# Patient Record
Sex: Female | Born: 1935 | Race: White | Hispanic: No | Smoking: Never smoker
Health system: Southern US, Community
[De-identification: ages and names within clinical notes are randomized; demographics above are authoritative.]

## PROBLEM LIST (undated history)

## (undated) ENCOUNTER — Inpatient Hospital Stay: Admission: EM | Payer: Self-pay | Source: Home / Self Care

## (undated) DIAGNOSIS — I10 Essential (primary) hypertension: Secondary | ICD-10-CM

## (undated) DIAGNOSIS — E119 Type 2 diabetes mellitus without complications: Secondary | ICD-10-CM

## (undated) DIAGNOSIS — D649 Anemia, unspecified: Secondary | ICD-10-CM

## (undated) DIAGNOSIS — E785 Hyperlipidemia, unspecified: Secondary | ICD-10-CM

## (undated) DIAGNOSIS — G2581 Restless legs syndrome: Secondary | ICD-10-CM

## (undated) DIAGNOSIS — Z720 Tobacco use: Secondary | ICD-10-CM

## (undated) DIAGNOSIS — I2510 Atherosclerotic heart disease of native coronary artery without angina pectoris: Secondary | ICD-10-CM

## (undated) DIAGNOSIS — I509 Heart failure, unspecified: Secondary | ICD-10-CM

## (undated) DIAGNOSIS — F419 Anxiety disorder, unspecified: Secondary | ICD-10-CM

## (undated) DIAGNOSIS — K219 Gastro-esophageal reflux disease without esophagitis: Secondary | ICD-10-CM

## (undated) DIAGNOSIS — I480 Paroxysmal atrial fibrillation: Secondary | ICD-10-CM

## (undated) HISTORY — DX: Paroxysmal atrial fibrillation: I48.0

## (undated) HISTORY — DX: Tobacco use: Z72.0

## (undated) HISTORY — DX: Anemia, unspecified: D64.9

## (undated) HISTORY — DX: Essential (primary) hypertension: I10

## (undated) HISTORY — DX: Restless legs syndrome: G25.81

## (undated) HISTORY — DX: Anxiety disorder, unspecified: F41.9

## (undated) HISTORY — DX: Heart failure, unspecified: I50.9

## (undated) HISTORY — DX: Hyperlipidemia, unspecified: E78.5

## (undated) HISTORY — DX: Gastro-esophageal reflux disease without esophagitis: K21.9

## (undated) HISTORY — DX: Atherosclerotic heart disease of native coronary artery without angina pectoris: I25.10

## (undated) HISTORY — DX: Type 2 diabetes mellitus without complications: E11.9

---

## 1997-06-02 ENCOUNTER — Encounter: Admission: RE | Admit: 1997-06-02 | Discharge: 1997-06-02 | Payer: Self-pay | Admitting: Sports Medicine

## 1997-08-13 ENCOUNTER — Encounter: Admission: RE | Admit: 1997-08-13 | Discharge: 1997-08-13 | Payer: Self-pay | Admitting: Sports Medicine

## 1997-08-26 ENCOUNTER — Encounter: Admission: RE | Admit: 1997-08-26 | Discharge: 1997-08-26 | Payer: Self-pay | Admitting: Family Medicine

## 1997-09-04 ENCOUNTER — Encounter: Admission: RE | Admit: 1997-09-04 | Discharge: 1997-09-04 | Payer: Self-pay | Admitting: Family Medicine

## 1997-09-11 ENCOUNTER — Encounter: Admission: RE | Admit: 1997-09-11 | Discharge: 1997-09-11 | Payer: Self-pay | Admitting: Family Medicine

## 1997-10-19 ENCOUNTER — Encounter: Admission: RE | Admit: 1997-10-19 | Discharge: 1997-10-19 | Payer: Self-pay | Admitting: Family Medicine

## 1997-11-17 ENCOUNTER — Encounter: Admission: RE | Admit: 1997-11-17 | Discharge: 1997-11-17 | Payer: Self-pay | Admitting: Family Medicine

## 1998-01-13 ENCOUNTER — Encounter: Admission: RE | Admit: 1998-01-13 | Discharge: 1998-01-13 | Payer: Self-pay | Admitting: Family Medicine

## 1998-02-15 ENCOUNTER — Encounter: Admission: RE | Admit: 1998-02-15 | Discharge: 1998-02-15 | Payer: Self-pay | Admitting: Sports Medicine

## 1998-03-03 ENCOUNTER — Encounter: Admission: RE | Admit: 1998-03-03 | Discharge: 1998-03-03 | Payer: Self-pay | Admitting: Sports Medicine

## 1998-04-06 ENCOUNTER — Encounter: Admission: RE | Admit: 1998-04-06 | Discharge: 1998-04-06 | Payer: Self-pay | Admitting: Sports Medicine

## 1998-05-10 ENCOUNTER — Encounter: Admission: RE | Admit: 1998-05-10 | Discharge: 1998-05-10 | Payer: Self-pay | Admitting: Family Medicine

## 1998-06-21 ENCOUNTER — Encounter: Admission: RE | Admit: 1998-06-21 | Discharge: 1998-06-21 | Payer: Self-pay | Admitting: Family Medicine

## 1998-06-29 ENCOUNTER — Encounter: Admission: RE | Admit: 1998-06-29 | Discharge: 1998-06-29 | Payer: Self-pay | Admitting: Sports Medicine

## 1998-06-30 ENCOUNTER — Encounter: Admission: RE | Admit: 1998-06-30 | Discharge: 1998-06-30 | Payer: Self-pay | Admitting: Family Medicine

## 1998-08-03 ENCOUNTER — Encounter: Admission: RE | Admit: 1998-08-03 | Discharge: 1998-08-03 | Payer: Self-pay | Admitting: Sports Medicine

## 1998-09-09 ENCOUNTER — Ambulatory Visit (HOSPITAL_COMMUNITY): Admission: RE | Admit: 1998-09-09 | Discharge: 1998-09-09 | Payer: Self-pay | Admitting: *Deleted

## 1998-09-14 ENCOUNTER — Encounter: Admission: RE | Admit: 1998-09-14 | Discharge: 1998-09-14 | Payer: Self-pay | Admitting: Family Medicine

## 1998-09-28 ENCOUNTER — Encounter: Admission: RE | Admit: 1998-09-28 | Discharge: 1998-09-28 | Payer: Self-pay | Admitting: Family Medicine

## 1998-10-18 ENCOUNTER — Encounter: Admission: RE | Admit: 1998-10-18 | Discharge: 1998-10-18 | Payer: Self-pay | Admitting: Family Medicine

## 1998-12-10 ENCOUNTER — Encounter: Admission: RE | Admit: 1998-12-10 | Discharge: 1998-12-10 | Payer: Self-pay | Admitting: Family Medicine

## 1999-05-03 ENCOUNTER — Encounter: Admission: RE | Admit: 1999-05-03 | Discharge: 1999-05-03 | Payer: Self-pay | Admitting: Sports Medicine

## 1999-06-29 ENCOUNTER — Encounter: Admission: RE | Admit: 1999-06-29 | Discharge: 1999-06-29 | Payer: Self-pay | Admitting: Sports Medicine

## 1999-06-29 ENCOUNTER — Encounter: Payer: Self-pay | Admitting: Sports Medicine

## 1999-07-27 ENCOUNTER — Encounter: Admission: RE | Admit: 1999-07-27 | Discharge: 1999-07-27 | Payer: Self-pay | Admitting: Family Medicine

## 1999-08-09 ENCOUNTER — Ambulatory Visit: Admission: RE | Admit: 1999-08-09 | Discharge: 1999-08-09 | Payer: Self-pay

## 1999-12-14 ENCOUNTER — Encounter: Admission: RE | Admit: 1999-12-14 | Discharge: 1999-12-14 | Payer: Self-pay | Admitting: Family Medicine

## 2000-01-04 ENCOUNTER — Encounter: Admission: RE | Admit: 2000-01-04 | Discharge: 2000-01-04 | Payer: Self-pay | Admitting: Family Medicine

## 2000-03-15 ENCOUNTER — Encounter: Admission: RE | Admit: 2000-03-15 | Discharge: 2000-03-15 | Payer: Self-pay | Admitting: Family Medicine

## 2000-04-26 ENCOUNTER — Encounter: Admission: RE | Admit: 2000-04-26 | Discharge: 2000-04-26 | Payer: Self-pay | Admitting: Family Medicine

## 2000-05-02 ENCOUNTER — Encounter: Payer: Self-pay | Admitting: Sports Medicine

## 2000-05-02 ENCOUNTER — Encounter: Admission: RE | Admit: 2000-05-02 | Discharge: 2000-05-02 | Payer: Self-pay | Admitting: Sports Medicine

## 2000-05-07 ENCOUNTER — Encounter: Admission: RE | Admit: 2000-05-07 | Discharge: 2000-05-07 | Payer: Self-pay | Admitting: Family Medicine

## 2000-05-15 ENCOUNTER — Encounter: Admission: RE | Admit: 2000-05-15 | Discharge: 2000-05-15 | Payer: Self-pay | Admitting: Family Medicine

## 2000-06-01 ENCOUNTER — Encounter: Admission: RE | Admit: 2000-06-01 | Discharge: 2000-06-01 | Payer: Self-pay | Admitting: Family Medicine

## 2000-07-03 ENCOUNTER — Encounter: Admission: RE | Admit: 2000-07-03 | Discharge: 2000-07-03 | Payer: Self-pay | Admitting: Family Medicine

## 2000-07-19 ENCOUNTER — Encounter: Admission: RE | Admit: 2000-07-19 | Discharge: 2000-07-19 | Payer: Self-pay | Admitting: Family Medicine

## 2000-07-25 ENCOUNTER — Ambulatory Visit (HOSPITAL_COMMUNITY): Admission: RE | Admit: 2000-07-25 | Discharge: 2000-07-25 | Payer: Self-pay | Admitting: *Deleted

## 2000-08-13 ENCOUNTER — Encounter: Admission: RE | Admit: 2000-08-13 | Discharge: 2000-08-13 | Payer: Self-pay | Admitting: Family Medicine

## 2000-08-21 ENCOUNTER — Encounter: Admission: RE | Admit: 2000-08-21 | Discharge: 2000-08-21 | Payer: Self-pay | Admitting: Family Medicine

## 2000-09-27 ENCOUNTER — Encounter (INDEPENDENT_AMBULATORY_CARE_PROVIDER_SITE_OTHER): Payer: Self-pay | Admitting: *Deleted

## 2000-09-27 ENCOUNTER — Encounter: Admission: RE | Admit: 2000-09-27 | Discharge: 2000-09-27 | Payer: Self-pay | Admitting: Family Medicine

## 2000-09-27 LAB — CONVERTED CEMR LAB

## 2000-10-01 ENCOUNTER — Other Ambulatory Visit: Admission: RE | Admit: 2000-10-01 | Discharge: 2000-10-01 | Payer: Self-pay | Admitting: Obstetrics and Gynecology

## 2000-10-09 ENCOUNTER — Encounter: Admission: RE | Admit: 2000-10-09 | Discharge: 2000-10-09 | Payer: Self-pay | Admitting: Sports Medicine

## 2000-10-09 ENCOUNTER — Encounter: Payer: Self-pay | Admitting: Sports Medicine

## 2000-10-18 ENCOUNTER — Encounter: Admission: RE | Admit: 2000-10-18 | Discharge: 2000-10-18 | Payer: Self-pay | Admitting: Family Medicine

## 2000-11-05 ENCOUNTER — Ambulatory Visit (HOSPITAL_COMMUNITY): Admission: RE | Admit: 2000-11-05 | Discharge: 2000-11-05 | Payer: Self-pay | Admitting: Obstetrics and Gynecology

## 2000-11-14 ENCOUNTER — Ambulatory Visit (HOSPITAL_COMMUNITY): Admission: RE | Admit: 2000-11-14 | Discharge: 2000-11-14 | Payer: Self-pay | Admitting: Cardiovascular Disease

## 2000-11-14 ENCOUNTER — Encounter: Payer: Self-pay | Admitting: Cardiovascular Disease

## 2000-11-23 ENCOUNTER — Encounter: Admission: RE | Admit: 2000-11-23 | Discharge: 2000-11-23 | Payer: Self-pay | Admitting: Family Medicine

## 2001-01-10 ENCOUNTER — Encounter: Admission: RE | Admit: 2001-01-10 | Discharge: 2001-01-10 | Payer: Self-pay | Admitting: Family Medicine

## 2001-01-29 ENCOUNTER — Encounter: Admission: RE | Admit: 2001-01-29 | Discharge: 2001-01-29 | Payer: Self-pay | Admitting: Sports Medicine

## 2001-01-29 ENCOUNTER — Encounter: Payer: Self-pay | Admitting: Sports Medicine

## 2001-03-22 ENCOUNTER — Ambulatory Visit (HOSPITAL_COMMUNITY): Admission: RE | Admit: 2001-03-22 | Discharge: 2001-03-22 | Payer: Self-pay | Admitting: Obstetrics and Gynecology

## 2001-03-22 ENCOUNTER — Encounter (INDEPENDENT_AMBULATORY_CARE_PROVIDER_SITE_OTHER): Payer: Self-pay

## 2001-05-17 ENCOUNTER — Encounter: Admission: RE | Admit: 2001-05-17 | Discharge: 2001-05-17 | Payer: Self-pay | Admitting: Family Medicine

## 2001-06-10 ENCOUNTER — Encounter: Admission: RE | Admit: 2001-06-10 | Discharge: 2001-06-10 | Payer: Self-pay | Admitting: Family Medicine

## 2001-07-10 ENCOUNTER — Encounter: Admission: RE | Admit: 2001-07-10 | Discharge: 2001-07-10 | Payer: Self-pay | Admitting: Family Medicine

## 2001-07-23 ENCOUNTER — Encounter: Admission: RE | Admit: 2001-07-23 | Discharge: 2001-07-23 | Payer: Self-pay | Admitting: Sports Medicine

## 2001-07-23 ENCOUNTER — Encounter: Payer: Self-pay | Admitting: Sports Medicine

## 2001-08-06 ENCOUNTER — Encounter: Payer: Self-pay | Admitting: Sports Medicine

## 2001-08-06 ENCOUNTER — Encounter: Admission: RE | Admit: 2001-08-06 | Discharge: 2001-09-03 | Payer: Self-pay | Admitting: Sports Medicine

## 2001-08-06 ENCOUNTER — Encounter: Admission: RE | Admit: 2001-08-06 | Discharge: 2001-08-06 | Payer: Self-pay | Admitting: Sports Medicine

## 2001-08-08 ENCOUNTER — Encounter: Admission: RE | Admit: 2001-08-08 | Discharge: 2001-08-08 | Payer: Self-pay | Admitting: Family Medicine

## 2001-08-29 ENCOUNTER — Encounter: Admission: RE | Admit: 2001-08-29 | Discharge: 2001-08-29 | Payer: Self-pay | Admitting: Family Medicine

## 2001-12-10 ENCOUNTER — Encounter: Admission: RE | Admit: 2001-12-10 | Discharge: 2001-12-10 | Payer: Self-pay | Admitting: Sports Medicine

## 2001-12-30 ENCOUNTER — Encounter: Payer: Self-pay | Admitting: Emergency Medicine

## 2001-12-30 ENCOUNTER — Inpatient Hospital Stay (HOSPITAL_COMMUNITY): Admission: EM | Admit: 2001-12-30 | Discharge: 2002-01-01 | Payer: Self-pay | Admitting: Emergency Medicine

## 2001-12-30 ENCOUNTER — Encounter: Payer: Self-pay | Admitting: Family Medicine

## 2002-01-03 ENCOUNTER — Encounter: Admission: RE | Admit: 2002-01-03 | Discharge: 2002-01-03 | Payer: Self-pay | Admitting: Family Medicine

## 2002-01-28 ENCOUNTER — Encounter: Admission: RE | Admit: 2002-01-28 | Discharge: 2002-01-28 | Payer: Self-pay | Admitting: Family Medicine

## 2002-02-28 ENCOUNTER — Emergency Department (HOSPITAL_COMMUNITY): Admission: EM | Admit: 2002-02-28 | Discharge: 2002-02-28 | Payer: Self-pay | Admitting: Emergency Medicine

## 2002-03-03 ENCOUNTER — Encounter: Admission: RE | Admit: 2002-03-03 | Discharge: 2002-03-03 | Payer: Self-pay | Admitting: Family Medicine

## 2002-03-10 ENCOUNTER — Encounter: Admission: RE | Admit: 2002-03-10 | Discharge: 2002-03-10 | Payer: Self-pay | Admitting: Family Medicine

## 2002-04-23 ENCOUNTER — Encounter: Admission: RE | Admit: 2002-04-23 | Discharge: 2002-04-23 | Payer: Self-pay | Admitting: Family Medicine

## 2002-05-19 ENCOUNTER — Encounter: Admission: RE | Admit: 2002-05-19 | Discharge: 2002-05-19 | Payer: Self-pay | Admitting: Family Medicine

## 2002-07-03 ENCOUNTER — Encounter: Admission: RE | Admit: 2002-07-03 | Discharge: 2002-07-03 | Payer: Self-pay | Admitting: Family Medicine

## 2002-08-14 ENCOUNTER — Inpatient Hospital Stay (HOSPITAL_COMMUNITY): Admission: EM | Admit: 2002-08-14 | Discharge: 2002-08-28 | Payer: Self-pay | Admitting: *Deleted

## 2002-08-15 ENCOUNTER — Encounter (INDEPENDENT_AMBULATORY_CARE_PROVIDER_SITE_OTHER): Payer: Self-pay | Admitting: Cardiology

## 2002-08-15 ENCOUNTER — Encounter: Payer: Self-pay | Admitting: *Deleted

## 2002-08-19 ENCOUNTER — Encounter: Payer: Self-pay | Admitting: *Deleted

## 2002-08-22 ENCOUNTER — Encounter (INDEPENDENT_AMBULATORY_CARE_PROVIDER_SITE_OTHER): Payer: Self-pay | Admitting: Specialist

## 2002-08-22 ENCOUNTER — Encounter: Payer: Self-pay | Admitting: Cardiothoracic Surgery

## 2002-08-23 ENCOUNTER — Encounter: Payer: Self-pay | Admitting: Cardiothoracic Surgery

## 2002-08-24 ENCOUNTER — Encounter: Payer: Self-pay | Admitting: Surgery

## 2002-08-25 HISTORY — PX: AORTIC VALVE REPLACEMENT: SHX41

## 2002-10-16 ENCOUNTER — Encounter: Admission: RE | Admit: 2002-10-16 | Discharge: 2002-10-16 | Payer: Self-pay | Admitting: Family Medicine

## 2002-12-24 ENCOUNTER — Encounter: Admission: RE | Admit: 2002-12-24 | Discharge: 2002-12-24 | Payer: Self-pay | Admitting: Family Medicine

## 2003-02-18 ENCOUNTER — Other Ambulatory Visit: Admission: RE | Admit: 2003-02-18 | Discharge: 2003-02-18 | Payer: Self-pay | Admitting: Obstetrics and Gynecology

## 2003-03-12 ENCOUNTER — Ambulatory Visit (HOSPITAL_COMMUNITY): Admission: RE | Admit: 2003-03-12 | Discharge: 2003-03-12 | Payer: Self-pay | Admitting: Cardiovascular Disease

## 2003-04-07 ENCOUNTER — Encounter: Admission: RE | Admit: 2003-04-07 | Discharge: 2003-04-07 | Payer: Self-pay | Admitting: Family Medicine

## 2003-04-14 ENCOUNTER — Encounter: Admission: RE | Admit: 2003-04-14 | Discharge: 2003-04-14 | Payer: Self-pay | Admitting: Family Medicine

## 2003-05-26 ENCOUNTER — Encounter: Admission: RE | Admit: 2003-05-26 | Discharge: 2003-05-26 | Payer: Self-pay | Admitting: Sports Medicine

## 2003-06-24 ENCOUNTER — Encounter: Admission: RE | Admit: 2003-06-24 | Discharge: 2003-06-24 | Payer: Self-pay | Admitting: Sports Medicine

## 2003-07-13 ENCOUNTER — Encounter: Admission: RE | Admit: 2003-07-13 | Discharge: 2003-07-13 | Payer: Self-pay | Admitting: Family Medicine

## 2003-07-23 ENCOUNTER — Ambulatory Visit (HOSPITAL_COMMUNITY): Admission: RE | Admit: 2003-07-23 | Discharge: 2003-07-24 | Payer: Self-pay | Admitting: Cardiovascular Disease

## 2003-08-20 ENCOUNTER — Encounter: Admission: RE | Admit: 2003-08-20 | Discharge: 2003-08-20 | Payer: Self-pay | Admitting: Sports Medicine

## 2003-11-23 ENCOUNTER — Ambulatory Visit: Payer: Self-pay | Admitting: Family Medicine

## 2004-01-12 ENCOUNTER — Ambulatory Visit: Payer: Self-pay | Admitting: Family Medicine

## 2004-02-02 ENCOUNTER — Emergency Department (HOSPITAL_COMMUNITY): Admission: EM | Admit: 2004-02-02 | Discharge: 2004-02-02 | Payer: Self-pay | Admitting: Emergency Medicine

## 2004-02-04 ENCOUNTER — Encounter (INDEPENDENT_AMBULATORY_CARE_PROVIDER_SITE_OTHER): Payer: Self-pay | Admitting: Specialist

## 2004-02-04 ENCOUNTER — Inpatient Hospital Stay (HOSPITAL_COMMUNITY): Admission: EM | Admit: 2004-02-04 | Discharge: 2004-02-05 | Payer: Self-pay | Admitting: Emergency Medicine

## 2004-03-10 ENCOUNTER — Ambulatory Visit: Payer: Self-pay | Admitting: Family Medicine

## 2004-05-27 ENCOUNTER — Ambulatory Visit: Payer: Self-pay | Admitting: Sports Medicine

## 2004-08-24 ENCOUNTER — Ambulatory Visit: Payer: Self-pay | Admitting: Family Medicine

## 2004-11-21 ENCOUNTER — Ambulatory Visit: Payer: Self-pay | Admitting: Family Medicine

## 2005-03-08 ENCOUNTER — Ambulatory Visit: Payer: Self-pay | Admitting: Family Medicine

## 2005-05-18 ENCOUNTER — Encounter (INDEPENDENT_AMBULATORY_CARE_PROVIDER_SITE_OTHER): Payer: Self-pay | Admitting: Family Medicine

## 2005-05-18 LAB — CONVERTED CEMR LAB: Pap Smear: NORMAL

## 2005-06-21 ENCOUNTER — Ambulatory Visit: Payer: Self-pay | Admitting: Family Medicine

## 2005-10-09 ENCOUNTER — Ambulatory Visit: Payer: Self-pay | Admitting: Sports Medicine

## 2006-01-26 ENCOUNTER — Ambulatory Visit: Payer: Self-pay | Admitting: Family Medicine

## 2006-04-27 ENCOUNTER — Encounter (INDEPENDENT_AMBULATORY_CARE_PROVIDER_SITE_OTHER): Payer: Self-pay | Admitting: *Deleted

## 2006-05-08 ENCOUNTER — Ambulatory Visit: Payer: Self-pay | Admitting: Family Medicine

## 2006-05-08 ENCOUNTER — Encounter: Payer: Self-pay | Admitting: Family Medicine

## 2006-05-08 ENCOUNTER — Encounter (INDEPENDENT_AMBULATORY_CARE_PROVIDER_SITE_OTHER): Payer: Self-pay | Admitting: Family Medicine

## 2006-05-08 DIAGNOSIS — I509 Heart failure, unspecified: Secondary | ICD-10-CM

## 2006-05-08 DIAGNOSIS — E119 Type 2 diabetes mellitus without complications: Secondary | ICD-10-CM

## 2006-05-08 DIAGNOSIS — E1169 Type 2 diabetes mellitus with other specified complication: Secondary | ICD-10-CM | POA: Insufficient documentation

## 2006-05-08 LAB — CONVERTED CEMR LAB
Chloride: 103 meq/L (ref 96–112)
Creatinine, Ser: 0.95 mg/dL (ref 0.40–1.20)
Glucose, Bld: 114 mg/dL — ABNORMAL HIGH (ref 70–99)
Sodium: 140 meq/L (ref 135–145)

## 2006-05-28 ENCOUNTER — Encounter (INDEPENDENT_AMBULATORY_CARE_PROVIDER_SITE_OTHER): Payer: Self-pay | Admitting: Family Medicine

## 2006-06-22 ENCOUNTER — Telehealth: Payer: Self-pay | Admitting: *Deleted

## 2006-06-28 ENCOUNTER — Encounter (INDEPENDENT_AMBULATORY_CARE_PROVIDER_SITE_OTHER): Payer: Self-pay | Admitting: Family Medicine

## 2006-07-05 ENCOUNTER — Encounter (INDEPENDENT_AMBULATORY_CARE_PROVIDER_SITE_OTHER): Payer: Self-pay | Admitting: Family Medicine

## 2006-07-05 LAB — CONVERTED CEMR LAB: Hemoglobin: 10.8 g/dL

## 2006-07-13 ENCOUNTER — Ambulatory Visit: Payer: Self-pay | Admitting: Family Medicine

## 2006-07-13 ENCOUNTER — Encounter (INDEPENDENT_AMBULATORY_CARE_PROVIDER_SITE_OTHER): Payer: Self-pay | Admitting: Family Medicine

## 2006-07-13 ENCOUNTER — Inpatient Hospital Stay (HOSPITAL_COMMUNITY): Admission: EM | Admit: 2006-07-13 | Discharge: 2006-07-19 | Payer: Self-pay | Admitting: Emergency Medicine

## 2006-07-18 ENCOUNTER — Ambulatory Visit: Payer: Self-pay | Admitting: Internal Medicine

## 2006-07-30 ENCOUNTER — Encounter (INDEPENDENT_AMBULATORY_CARE_PROVIDER_SITE_OTHER): Payer: Self-pay | Admitting: Family Medicine

## 2006-08-06 ENCOUNTER — Telehealth (INDEPENDENT_AMBULATORY_CARE_PROVIDER_SITE_OTHER): Payer: Self-pay | Admitting: Family Medicine

## 2006-08-16 ENCOUNTER — Telehealth: Payer: Self-pay | Admitting: *Deleted

## 2006-09-12 ENCOUNTER — Ambulatory Visit: Payer: Self-pay | Admitting: Family Medicine

## 2006-09-12 ENCOUNTER — Encounter (INDEPENDENT_AMBULATORY_CARE_PROVIDER_SITE_OTHER): Payer: Self-pay | Admitting: Family Medicine

## 2006-09-12 DIAGNOSIS — D649 Anemia, unspecified: Secondary | ICD-10-CM

## 2006-09-12 LAB — CONVERTED CEMR LAB: Hgb A1c MFr Bld: 6.1 %

## 2006-09-14 ENCOUNTER — Encounter (INDEPENDENT_AMBULATORY_CARE_PROVIDER_SITE_OTHER): Payer: Self-pay | Admitting: Family Medicine

## 2006-09-14 LAB — CONVERTED CEMR LAB
BUN: 21 mg/dL (ref 6–23)
Creatinine, Ser: 0.92 mg/dL (ref 0.40–1.20)
Glucose, Bld: 105 mg/dL — ABNORMAL HIGH (ref 70–99)
HCT: 38.4 % (ref 36.0–46.0)
Sodium: 139 meq/L (ref 135–145)
WBC: 9.5 10*3/uL (ref 4.0–10.5)

## 2006-09-17 ENCOUNTER — Telehealth (INDEPENDENT_AMBULATORY_CARE_PROVIDER_SITE_OTHER): Payer: Self-pay | Admitting: Family Medicine

## 2006-09-19 ENCOUNTER — Telehealth (INDEPENDENT_AMBULATORY_CARE_PROVIDER_SITE_OTHER): Payer: Self-pay | Admitting: Family Medicine

## 2006-10-01 ENCOUNTER — Encounter (INDEPENDENT_AMBULATORY_CARE_PROVIDER_SITE_OTHER): Payer: Self-pay | Admitting: Family Medicine

## 2006-10-05 ENCOUNTER — Telehealth (INDEPENDENT_AMBULATORY_CARE_PROVIDER_SITE_OTHER): Payer: Self-pay | Admitting: Family Medicine

## 2006-11-20 ENCOUNTER — Telehealth (INDEPENDENT_AMBULATORY_CARE_PROVIDER_SITE_OTHER): Payer: Self-pay | Admitting: Family Medicine

## 2006-12-18 ENCOUNTER — Encounter (INDEPENDENT_AMBULATORY_CARE_PROVIDER_SITE_OTHER): Payer: Self-pay | Admitting: Family Medicine

## 2007-02-25 ENCOUNTER — Encounter: Payer: Self-pay | Admitting: *Deleted

## 2007-03-14 ENCOUNTER — Encounter: Payer: Self-pay | Admitting: *Deleted

## 2007-04-05 ENCOUNTER — Encounter (INDEPENDENT_AMBULATORY_CARE_PROVIDER_SITE_OTHER): Payer: Self-pay | Admitting: Family Medicine

## 2007-04-05 ENCOUNTER — Ambulatory Visit: Payer: Self-pay | Admitting: Family Medicine

## 2007-04-05 DIAGNOSIS — I2510 Atherosclerotic heart disease of native coronary artery without angina pectoris: Secondary | ICD-10-CM | POA: Insufficient documentation

## 2007-04-05 LAB — CONVERTED CEMR LAB
AST: 12 units/L (ref 0–37)
Alkaline Phosphatase: 43 units/L (ref 39–117)
BUN: 25 mg/dL — ABNORMAL HIGH (ref 6–23)
Calcium: 9.9 mg/dL (ref 8.4–10.5)
Chloride: 100 meq/L (ref 96–112)
Glucose, Bld: 122 mg/dL — ABNORMAL HIGH (ref 70–99)
MCHC: 31.6 g/dL (ref 30.0–36.0)
MCV: 82 fL (ref 78.0–100.0)
Platelets: 291 10*3/uL (ref 150–400)
RBC: 4.71 M/uL (ref 3.87–5.11)
Sodium: 143 meq/L (ref 135–145)
Total Bilirubin: 0.4 mg/dL (ref 0.3–1.2)
Total Protein: 7.1 g/dL (ref 6.0–8.3)
WBC: 9.4 10*3/uL (ref 4.0–10.5)

## 2007-04-15 ENCOUNTER — Encounter (INDEPENDENT_AMBULATORY_CARE_PROVIDER_SITE_OTHER): Payer: Self-pay | Admitting: Family Medicine

## 2007-04-16 ENCOUNTER — Ambulatory Visit: Admission: RE | Admit: 2007-04-16 | Discharge: 2007-04-16 | Payer: Self-pay | Admitting: Family Medicine

## 2007-04-16 ENCOUNTER — Ambulatory Visit: Payer: Self-pay | Admitting: Vascular Surgery

## 2007-04-16 ENCOUNTER — Encounter (INDEPENDENT_AMBULATORY_CARE_PROVIDER_SITE_OTHER): Payer: Self-pay | Admitting: Family Medicine

## 2007-05-13 ENCOUNTER — Encounter (INDEPENDENT_AMBULATORY_CARE_PROVIDER_SITE_OTHER): Payer: Self-pay | Admitting: Family Medicine

## 2007-05-16 ENCOUNTER — Encounter (INDEPENDENT_AMBULATORY_CARE_PROVIDER_SITE_OTHER): Payer: Self-pay | Admitting: Family Medicine

## 2007-06-11 ENCOUNTER — Telehealth: Payer: Self-pay | Admitting: *Deleted

## 2007-08-20 ENCOUNTER — Encounter (INDEPENDENT_AMBULATORY_CARE_PROVIDER_SITE_OTHER): Payer: Self-pay | Admitting: Family Medicine

## 2007-08-20 ENCOUNTER — Ambulatory Visit: Payer: Self-pay | Admitting: Family Medicine

## 2007-08-20 DIAGNOSIS — F341 Dysthymic disorder: Secondary | ICD-10-CM

## 2007-08-20 LAB — CONVERTED CEMR LAB
Albumin: 4.3 g/dL (ref 3.5–5.2)
CO2: 23 meq/L (ref 19–32)
Calcium: 9.2 mg/dL (ref 8.4–10.5)
Glucose, Bld: 142 mg/dL — ABNORMAL HIGH (ref 70–99)
Potassium: 4.3 meq/L (ref 3.5–5.3)
Pro B Natriuretic peptide (BNP): 127 pg/mL — ABNORMAL HIGH (ref 0.0–100.0)
Total Bilirubin: 0.5 mg/dL (ref 0.3–1.2)

## 2007-08-30 ENCOUNTER — Encounter (INDEPENDENT_AMBULATORY_CARE_PROVIDER_SITE_OTHER): Payer: Self-pay | Admitting: Family Medicine

## 2007-09-23 ENCOUNTER — Telehealth: Payer: Self-pay | Admitting: Family Medicine

## 2007-10-23 ENCOUNTER — Ambulatory Visit: Payer: Self-pay | Admitting: Family Medicine

## 2007-10-23 ENCOUNTER — Encounter: Payer: Self-pay | Admitting: Family Medicine

## 2007-10-23 DIAGNOSIS — E1169 Type 2 diabetes mellitus with other specified complication: Secondary | ICD-10-CM | POA: Insufficient documentation

## 2007-10-23 DIAGNOSIS — M1710 Unilateral primary osteoarthritis, unspecified knee: Secondary | ICD-10-CM

## 2007-10-23 DIAGNOSIS — L570 Actinic keratosis: Secondary | ICD-10-CM | POA: Insufficient documentation

## 2007-10-23 DIAGNOSIS — E785 Hyperlipidemia, unspecified: Secondary | ICD-10-CM

## 2007-10-23 LAB — CONVERTED CEMR LAB: LDL Cholesterol: 205 mg/dL

## 2007-11-22 ENCOUNTER — Ambulatory Visit: Payer: Self-pay | Admitting: Family Medicine

## 2007-11-22 DIAGNOSIS — T8209XA Other mechanical complication of heart valve prosthesis, initial encounter: Secondary | ICD-10-CM

## 2007-11-22 DIAGNOSIS — G2581 Restless legs syndrome: Secondary | ICD-10-CM | POA: Insufficient documentation

## 2007-12-16 ENCOUNTER — Encounter: Payer: Self-pay | Admitting: Family Medicine

## 2007-12-23 ENCOUNTER — Telehealth: Payer: Self-pay | Admitting: *Deleted

## 2008-01-27 ENCOUNTER — Telehealth: Payer: Self-pay | Admitting: *Deleted

## 2008-02-26 ENCOUNTER — Encounter: Payer: Self-pay | Admitting: Family Medicine

## 2008-02-26 DIAGNOSIS — Z789 Other specified health status: Secondary | ICD-10-CM | POA: Insufficient documentation

## 2008-02-27 ENCOUNTER — Telehealth (INDEPENDENT_AMBULATORY_CARE_PROVIDER_SITE_OTHER): Payer: Self-pay | Admitting: *Deleted

## 2008-03-26 ENCOUNTER — Ambulatory Visit: Payer: Self-pay | Admitting: Family Medicine

## 2008-03-26 DIAGNOSIS — I10 Essential (primary) hypertension: Secondary | ICD-10-CM | POA: Insufficient documentation

## 2008-03-26 LAB — CONVERTED CEMR LAB

## 2008-04-20 ENCOUNTER — Telehealth (INDEPENDENT_AMBULATORY_CARE_PROVIDER_SITE_OTHER): Payer: Self-pay | Admitting: *Deleted

## 2008-06-04 ENCOUNTER — Encounter: Payer: Self-pay | Admitting: Family Medicine

## 2008-07-20 ENCOUNTER — Encounter (INDEPENDENT_AMBULATORY_CARE_PROVIDER_SITE_OTHER): Payer: Self-pay | Admitting: *Deleted

## 2008-08-06 ENCOUNTER — Encounter: Payer: Self-pay | Admitting: Family Medicine

## 2008-08-07 ENCOUNTER — Ambulatory Visit: Payer: Self-pay | Admitting: Family Medicine

## 2008-08-07 DIAGNOSIS — K219 Gastro-esophageal reflux disease without esophagitis: Secondary | ICD-10-CM | POA: Insufficient documentation

## 2008-08-07 DIAGNOSIS — E538 Deficiency of other specified B group vitamins: Secondary | ICD-10-CM | POA: Insufficient documentation

## 2008-08-07 LAB — CONVERTED CEMR LAB: Hgb A1c MFr Bld: 7 %

## 2008-08-10 ENCOUNTER — Encounter: Payer: Self-pay | Admitting: Family Medicine

## 2008-08-10 DIAGNOSIS — M899 Disorder of bone, unspecified: Secondary | ICD-10-CM | POA: Insufficient documentation

## 2008-08-10 DIAGNOSIS — M949 Disorder of cartilage, unspecified: Secondary | ICD-10-CM

## 2008-08-11 ENCOUNTER — Encounter: Payer: Self-pay | Admitting: *Deleted

## 2008-08-12 ENCOUNTER — Telehealth (INDEPENDENT_AMBULATORY_CARE_PROVIDER_SITE_OTHER): Payer: Self-pay | Admitting: *Deleted

## 2008-08-25 ENCOUNTER — Telehealth (INDEPENDENT_AMBULATORY_CARE_PROVIDER_SITE_OTHER): Payer: Self-pay | Admitting: *Deleted

## 2008-09-07 ENCOUNTER — Telehealth (INDEPENDENT_AMBULATORY_CARE_PROVIDER_SITE_OTHER): Payer: Self-pay | Admitting: *Deleted

## 2008-10-07 ENCOUNTER — Encounter: Payer: Self-pay | Admitting: *Deleted

## 2008-11-03 ENCOUNTER — Ambulatory Visit: Payer: Self-pay | Admitting: Family Medicine

## 2008-11-03 LAB — CONVERTED CEMR LAB: Hgb A1c MFr Bld: 6.9 %

## 2008-12-01 ENCOUNTER — Telehealth: Payer: Self-pay | Admitting: *Deleted

## 2009-01-18 ENCOUNTER — Telehealth: Payer: Self-pay | Admitting: *Deleted

## 2009-03-01 ENCOUNTER — Encounter (INDEPENDENT_AMBULATORY_CARE_PROVIDER_SITE_OTHER): Payer: Self-pay | Admitting: *Deleted

## 2009-03-01 DIAGNOSIS — F17200 Nicotine dependence, unspecified, uncomplicated: Secondary | ICD-10-CM

## 2009-03-05 ENCOUNTER — Telehealth: Payer: Self-pay | Admitting: *Deleted

## 2009-04-06 ENCOUNTER — Encounter: Payer: Self-pay | Admitting: Family Medicine

## 2009-04-06 ENCOUNTER — Ambulatory Visit: Payer: Self-pay | Admitting: Family Medicine

## 2009-04-23 LAB — CONVERTED CEMR LAB
AST: 18 units/L (ref 0–37)
Albumin: 4.1 g/dL (ref 3.5–5.2)
Alkaline Phosphatase: 39 units/L (ref 39–117)
BUN: 17 mg/dL (ref 6–23)
Glucose, Bld: 99 mg/dL (ref 70–99)
HDL: 45 mg/dL (ref >39)
LDL Cholesterol: 219 mg/dL — ABNORMAL HIGH (ref 0–99)
Potassium: 3.8 meq/L (ref 3.5–5.3)
Total Bilirubin: 0.4 mg/dL (ref 0.3–1.2)
Total CHOL/HDL Ratio: 6.8
Triglycerides: 203 mg/dL — ABNORMAL HIGH (ref <150)
VLDL: 41 mg/dL — ABNORMAL HIGH (ref 0–40)

## 2009-06-10 ENCOUNTER — Encounter: Payer: Self-pay | Admitting: Family Medicine

## 2009-09-07 ENCOUNTER — Encounter: Payer: Self-pay | Admitting: Family Medicine

## 2009-09-07 ENCOUNTER — Ambulatory Visit: Payer: Self-pay | Admitting: Family Medicine

## 2009-09-07 DIAGNOSIS — R269 Unspecified abnormalities of gait and mobility: Secondary | ICD-10-CM

## 2009-09-23 ENCOUNTER — Telehealth: Payer: Self-pay | Admitting: Family Medicine

## 2009-09-24 ENCOUNTER — Telehealth: Payer: Self-pay | Admitting: Family Medicine

## 2009-11-02 ENCOUNTER — Ambulatory Visit: Payer: Self-pay | Admitting: Family Medicine

## 2009-11-13 ENCOUNTER — Emergency Department (HOSPITAL_COMMUNITY): Admission: EM | Admit: 2009-11-13 | Discharge: 2009-11-13 | Payer: Self-pay | Admitting: Emergency Medicine

## 2010-01-13 ENCOUNTER — Telehealth (INDEPENDENT_AMBULATORY_CARE_PROVIDER_SITE_OTHER): Payer: Self-pay | Admitting: *Deleted

## 2010-01-26 ENCOUNTER — Encounter (INDEPENDENT_AMBULATORY_CARE_PROVIDER_SITE_OTHER): Payer: Self-pay | Admitting: *Deleted

## 2010-02-04 ENCOUNTER — Telehealth: Payer: Self-pay | Admitting: Family Medicine

## 2010-02-07 ENCOUNTER — Telehealth: Payer: Self-pay | Admitting: Family Medicine

## 2010-02-10 ENCOUNTER — Encounter: Payer: Self-pay | Admitting: Family Medicine

## 2010-02-11 ENCOUNTER — Ambulatory Visit: Payer: Self-pay | Admitting: Family Medicine

## 2010-03-29 NOTE — Progress Notes (Signed)
Summary: meds prob  Phone Note Call from Patient Call back at [phone]   Caller: Patient Summary of Call: pt has appt on Jan 11th and wants to know if she can get enough meds called in until appt.(see previous note) Initial call taken by: Audie Clear,  March 05, 2009 10:06 AM  Follow-up for Phone Call        to pcp Follow-up by: Elige Radon RN,  March 05, 2009 10:09 AM  Additional Follow-up for Phone Call Additional follow up Details #1::        should be at pharmacy.  I printed scripts with signature and gave to red team. Additional Follow-up by: Ria Bush  MD,  March 05, 2009 11:38 AM    Additional Follow-up for Phone Call Additional follow up Details #2::    called pt's dtr. rx's faxed today and pt has upcoming with dr.gutierrez. Follow-up by: Mauricia Area CMA,,  March 05, 2009 11:50 AM

## 2010-03-29 NOTE — Assessment & Plan Note (Signed)
Summary: fu DM, Gait Instability, HLD, Anxiety   Vital Signs:  Patient profile:   77 year old female Height:      64 inches Weight:      155.3 pounds Pulse rate:   69 / minute BP sitting:   119 / 64  (right arm)  Vitals Entered By: Mauricia Area CMA, (September 07, 2009 1:33 PM)  CC: f/up DM and go over meds. Is Patient Diabetic? Yes Pain Assessment Patient in pain? yes     Location: back Intensity: 9 Onset of pain  Chronic   Primary Provider:  Luetta Nutting DO  CC:  f/up DM and go over meds..  History of Present Illness: 1. F/U for Diabetes-  States she feels like her diabetes is doing well, has been checking blood glucose at home, has been in the 130's most of the time.  Does not feel like her blood sugar has been dropping too low.  Does have some burning in her feet and legs.  This is not new.  Currently only on metformin. 2. F/U for HLD- Stopped taking pravastatin, began having myalgias again.  Now only taking 2 fish oil tablets per day.  Understands her cholesterol is not at goal.  Last CMP and Lipid panel done 04/06/09 3. Wants walker-  Feels like her walking is more unsteady.  Does have arthritis, worse in L knee.  Tends to drift to R side and has almost fallen a couple of times.  Denies associated dizziness or vision abnormality.   4. Wants letter to allow dog to stay with her-  Increased anxiety.  She feels like having dog stay with her would decrease her anxiety level.  Wants letter to recommend allowance of dog in her residence  ROS: Denies chest pain, shortness of breath, abdominal pain, loss of bladder function.     Current Medications (verified): 1)  Alprazolam 0.5 Mg Tabs (Alprazolam) .... Take 1 Tablet By Mouth Twice A Day 2)  Benazepril Hcl 20 Mg Tabs (Benazepril Hcl) .Marland Kitchen.. 1 Tab By Mouth Daily 3)  Coumadin 5 Mg Tabs (Warfarin Sodium) .... Take 1 Tablet By Mouth As Directed 4)  Digoxin 0.125 Mg Tabs (Digoxin) .Marland Kitchen.. 1 Tab By Mouth Daily 5)  Ferrous Sulfate 325 (65  Fe) Mg Tabs (Ferrous Sulfate) .... Take 1 Tablet By Mouth Daily 6)  Lasix 20 Mg Tabs (Furosemide) .... Take 1 Tablet By Mouth Once A Day 7)  Atenolol 50 Mg Tabs (Atenolol) .Marland Kitchen.. 1 1/2 Tabs By Mouth Daily 8)  Omeprazole 20 Mg Cpdr (Omeprazole) .... Take 1 Capsule By Mouth Bid 9)  Vicodin 5-500 Mg Tabs (Hydrocodone-Acetaminophen) .... Take 1 Tablet By Mouth Twice A Day 10)  Nasonex 50 Mcg/act  Susp (Mometasone Furoate) .... 2 Sprays Each Nostril Daily 11)  Glucophage 1000 Mg  Tabs (Metformin Hcl) .... One Tab By Mouth Bid 12)  Calcium 600+d 600-400 Mg-Unit Tabs (Calcium Carbonate-Vitamin D) .Marland Kitchen.. 1 Tab By Mouth Two Times A Day For Bones 13)  Cyanocobalamin 1000 Mcg/ml Soln (Cyanocobalamin) .Marland Kitchen.. 1070mcg Deep Subcutaneously or Im Weekly X 8 Weeks Then Monthly 14)  Fish Oil 1000 Mg Caps (Omega-3 Fatty Acids) .... 2 Caps By Mouth Daily 15)  Pravastatin Sodium 20 Mg Tabs (Pravastatin Sodium) .Marland Kitchen.. 1 Tab By Mouth Every Other Day  Allergies (verified): No Known Drug Allergies  Review of Systems       Pertinent positives and negatives noted in HPI, Vitals signs noted   Physical Exam  General:  alert, well-developed, and well-nourished.  Eyes:  pupils equal, pupils round, and pupils reactive to light.   Lungs:  normal respiratory effort and normal breath sounds.   Heart:  normal rate and regular rhythm.  Loud S2 from valve replacement  Abdomen:  soft, non-tender, normal bowel sounds, no distention, no masses, and no guarding.   Msk:  Antalgic gait with favoring of left side.  Patiet tends to put all of weight on right side and tends to drift.    Impression & Recommendations:  Problem # 1:  DM (ICD-250.00)  A1C's have been excellent.  Currently at goal.  Will recheck A1C again in 3 months.  Continue on metformin for now Her updated medication list for this problem includes:    Benazepril Hcl 20 Mg Tabs (Benazepril hcl) .Marland Kitchen... 1 tab by mouth daily    Glucophage 1000 Mg Tabs (Metformin hcl)  ..... One tab by mouth bid  Orders: [facility]- Est  Level 4 [id number])  Problem # 2:  GAIT IMBALANCE (ICD-781.2) Evaluated gait, likely secondary to pain in left knee or neuropathy.  Has almost fallen a  couple of times. Also with current medication regimen of vicodin and xanax along with coumadin would like to prevent falls if at all possible  Given Rx for walker.  Also put in consult for PT to assist with walker use and navigation of obstacles since she has never used one. Orders: Physical Therapy Referral (PT) [facility]- Est  Level 4 [id number])  Problem # 3:  HYPERLIPIDEMIA (ICD-272.4)  Can not tolerate statin at current dose. Diabetic and with cardiac history, not at goal. Talked to patient about starting to take pravastatin every other day, agrees.  If still havig myalgias can stop again. Her updated medication list for this problem includes:    Pravastatin Sodium 20 Mg Tabs (Pravastatin sodium) .Marland Kitchen... 1 tab by mouth every other day  Orders: [facility]- Est  Level 4 [id number])  Problem # 4:  ANXIETY DEPRESSION (ICD-300.4)  Requests letter to [employer] authority to allow her to have a dog for her anxiety.  States that without letter they would charge her $600.  Would be glad to do this for her.  Will continue alprazolam, but caution since she states she has been having trouble with gait.  Orders: [facility]- Est  Level 4 [id number])  Complete Medication List: 1)  Alprazolam 0.5 Mg Tabs (Alprazolam) .... Take 1 tablet by mouth twice a day 2)  Benazepril Hcl 20 Mg Tabs (Benazepril hcl) .Marland Kitchen.. 1 tab by mouth daily 3)  Coumadin 5 Mg Tabs (Warfarin sodium) .... Take 1 tablet by mouth as directed 4)  Digoxin 0.125 Mg Tabs (Digoxin) .Marland Kitchen.. 1 tab by mouth daily 5)  Ferrous Sulfate 325 (65 Fe) Mg Tabs (Ferrous sulfate) .... Take 1 tablet by mouth daily 6)  Lasix 20 Mg Tabs (Furosemide) .... Take 1 tablet by mouth once a day 7)  Atenolol 50 Mg Tabs (Atenolol) .Marland Kitchen.. 1 1/2 tabs by mouth daily 8)  Omeprazole 20 Mg Cpdr  (Omeprazole) .... Take 1 capsule by mouth bid 9)  Vicodin 5-500 Mg Tabs (Hydrocodone-acetaminophen) .... Take 1 tablet by mouth twice a day 10)  Nasonex 50 Mcg/act Susp (Mometasone furoate) .... 2 sprays each nostril daily 11)  Glucophage 1000 Mg Tabs (Metformin hcl) .... One tab by mouth bid 12)  Calcium 600+d 600-400 Mg-unit Tabs (Calcium carbonate-vitamin d) .Marland Kitchen.. 1 tab by mouth two times a day for bones 13)  Cyanocobalamin 1000 Mcg/ml Soln (Cyanocobalamin) .Marland Kitchen.. 1037mcg deep subcutaneously or im weekly  x 8 weeks then monthly 14)  Fish Oil 1000 Mg Caps (Omega-3 fatty acids) .... 2 caps by mouth daily 15)  Pravastatin Sodium 20 Mg Tabs (Pravastatin sodium) .Marland Kitchen.. 1 tab by mouth every other day  Patient Instructions: 1)  It was nice meeting you today 2)  Your blood sugars seem to be in excellent control, we would like or you to return in 3 months to have your A1C evaluated. 3)  I would like for you to try your cholesterol medication again.  The pravastatin should be taken every other day, if you have muscle aches discontinue.  Continue the two fish oil for now as well. 4)  I am giving you a prescription for a walker, and also a referral for physical therapy to assist you in use of the walker. 5)  I will write a letter for you to have your dog and mail it to you 6)  I will see you back in 3 months for follow-up.  Feel free to call sooner if you need to.  Prevention & Chronic Care Immunizations   Influenza vaccine: given   (03/08/2009)   Influenza vaccine due: 10/28/2009    Tetanus booster: 04/27/2001: Done.   Tetanus booster due: 04/28/2011    Pneumococcal vaccine: Done.  (02/27/2005)   Pneumococcal vaccine due: None    H. zoster vaccine: Not documented  Colorectal Screening   Hemoccult: Done.  (06/28/2002)   Hemoccult action/deferral: Ordered  (11/03/2008)   Hemoccult due: Not Indicated    Colonoscopy: refused  (11/22/2007)   Colonoscopy due: Refused  (11/22/2007)  Other  Screening   Pap smear: normal  (05/18/2005)   Pap smear action/deferral: Not indicated-other  (11/03/2008)   Pap smear due: Not Indicated    Mammogram: Done.  (06/27/2001)   Mammogram action/deferral: Ordered  (04/06/2009)   Mammogram due: 06/28/2002    DXA bone density scan: Done.  (03/31/2003)   DXA scan due: None    Smoking status: never  (04/06/2009)  Diabetes Mellitus   HgbA1C: 6.2  (06/21/2009)   Hemoglobin A1C due: 11/20/2007    Eye exam: normal  (05/29/2007)   Eye exam due: 05/28/2008    Foot exam: yes  (11/03/2008)   High risk foot: Not documented   Foot care education: Not documented   Foot exam due: 04/04/2008    Urine microalbumin/creatinine ratio: Not documented   Urine microalbumin/cr due: 03/26/2009    Diabetes flowsheet reviewed?: Yes   Progress toward A1C goal: At goal  Lipids   Total Cholesterol: 305  (04/06/2009)   LDL: 219  (04/06/2009)   LDL Direct: 205  (10/23/2007)   HDL: 45  (04/06/2009)   Triglycerides: 203  (04/06/2009)   Lipid panel due: 12/03/2008    SGOT (AST): 20  (06/10/2009)   SGPT (ALT): 17  (06/10/2009)   Alkaline phosphatase: 41  (06/10/2009)   Total bilirubin: 0.5  (06/10/2009)   Liver panel due: 12/03/2008    Lipid flowsheet reviewed?: Yes   Progress toward LDL goal: Unchanged  Hypertension   Last Blood Pressure: 119 / 64  (09/07/2009)   Serum creatinine: 1.1  (06/10/2009)   Serum potassium 4.5  (0000000)   Basic metabolic panel due: 123456    Hypertension flowsheet reviewed?: Yes   Progress toward BP goal: At goal  Self-Management Support :   Personal Goals (by the next clinic visit) :     Personal A1C goal: 7  (11/03/2008)     Personal blood pressure goal: 140/90  (11/03/2008)  Personal LDL goal: 100  (11/03/2008)    Diabetes self-management support: Not documented    Hypertension self-management support: Not documented    Lipid self-management support: Written self-care plan  (04/06/2009)

## 2010-03-29 NOTE — Progress Notes (Signed)
Summary: refill  Phone Note Refill Request Call back at [phone] Message from:  Patient  Refills Requested: Medication #1:  ATENOLOL 50 MG TABS 1 1/2 tabs by mouth daily pt is now out  Initial call taken by: Audie Clear,  January 13, 2010 9:57 AM  Follow-up for Phone Call        RX sent .  Follow-up by: Marcell Barlow RN,  January 13, 2010 11:20 AM

## 2010-03-29 NOTE — Progress Notes (Signed)
Summary: meds prob  Phone Note Call from Patient Call back at [phone]   Caller: Patient Summary of Call: pt states she cannot take BENAZEPRIL HCL 20 MG TABS - she ususally takes Amlodipine  dr Rollene Fare had given her that one - states that Benazapril makes her hair fall out and elevates BP - isn't sure why it was switched. [facility] [place] Initial call taken by: Audie Clear,  September 23, 2009 9:32 AM  Follow-up for Phone Call        tried to call pt. "the vm has not been st up"  pcp is on vacation. to preceptor Follow-up by: Elige Radon RN,  September 23, 2009 9:36 AM  Additional Follow-up for Phone Call Additional follow up Details #1::        voice mailbox has not been set up yet.  WHEN SHE CALLS US-MAKE HER AN APPT WITH PCP RE: MEDS Additional Follow-up by: Elige Radon RN,  September 24, 2009 9:13 AM    Additional Follow-up for Phone Call Additional follow up Details #2::    Pt confused as to why she needs to come back in. Follow-up by: Raymond Gurney,  September 24, 2009 1:56 PM  Additional Follow-up for Phone Call Additional follow up Details #3:: Details for Additional Follow-up Action Taken: vm not set up yet  WHEN SHE CALLS BACK, PUT HER THRU TO ME Additional Follow-up by: Elige Radon RN,  September 24, 2009 1:59 PM  see other note from front office.Elige Radon RN  September 24, 2009 4:06 PM

## 2010-03-29 NOTE — Letter (Signed)
Summary: Letter for service animal  [facility]  [address]   [place], [place] [address]   Phone: [phone]  Fax: [phone]    09/07/2009  [id number] [facility]    [address]    [place], [place]  [address]  To Whom it May Concern:  Ms. [name] has been diagnosed with generalized anxiety disorder.  In order for her to cope with her anxiety symptoms she would like to have a service animal for therapeutic reasons.  It is my recommendation that she be allowed to have a service animal to live with her for medical reasons related to the above diagnosis.    Sincerely,   Luetta Nutting DO  Appended Document: Letter for service animal mailed

## 2010-03-29 NOTE — Progress Notes (Signed)
Summary: triage  Phone Note Call from Patient Call back at Home Phone [phone]   Caller: Patient Summary of Call: Pt calling back and stating she sees no need in coming in again when she was just here and that she has a bad heart and she does not need all this anxiety about getting her meds.   Initial call taken by: Raymond Gurney,  September 24, 2009 4:00 PM  Follow-up for Phone Call        to Dr. Erin Hearing who asked that she return to clinic to discuss meds Follow-up by: Elige Radon RN,  September 24, 2009 4:05 PM  Additional Follow-up for Phone Call Additional follow up Details #1::        Called her and discussed medications.  Seems to have been on amol/benazpril 10/20 for last year not on benzapril alone.  Will stop benazapril and start combination with 3 mo supply.  Asked her to bring in all her med bottles next visit Additional Follow-up by: Talbert Cage MD,  September 24, 2009 4:21 PM    New/Updated Medications: AMLODIPINE BESY-BENAZEPRIL HCL 10-20 MG CAPS (AMLODIPINE BESY-BENAZEPRIL HCL) 1 by daily for blood pressure Prescriptions: AMLODIPINE BESY-BENAZEPRIL HCL 10-20 MG CAPS (AMLODIPINE BESY-BENAZEPRIL HCL) 1 by daily for blood pressure  #30 x 2   Entered and Authorized by:   Talbert Cage MD   Signed by:   Talbert Cage MD on 09/24/2009   Method used:   [facility]        [facility]  [address] [phone] ([facility]ail[facility]       [address]       [place], [place]  [address]       Ph: [phone] or [phone]       Fax: [phone]   RxID:   [id number]

## 2010-03-29 NOTE — Assessment & Plan Note (Signed)
Summary: pain & nerve m,ed refill-gait instability issues/[place]/matthews   Vital Signs:  Patient profile:   77 year old female Height:      64 inches Weight:      155 pounds BMI:     26.70 Temp:     97.9 degrees F oral Pulse rate:   155 / minute BP sitting:   146 / 70  (left arm) Cuff size:   regular  Vitals Entered By: Schuyler Amor CMA (November 02, 2009 2:16 PM) CC: F/U meds Is Patient Diabetic? Yes Pain Assessment Patient in pain? yes     Location: lower back, bilateral knee Intensity: 8   Primary Care Athanasius Kesling:  Luetta Nutting DO  CC:  F/U meds.  History of Present Illness: Patient and daughter were very upset because of having to come in for refill of meds when they were here in July.  Mrs Younghans says that she does not abuse her meds, she has been on them forever and her body needs them to function.  She denies falling or instabiltiy.  She is on coumadin and followed by cardiology for such.  We discussed changeing from aprazolam to lorazepam and she was not interested at this time.   AK removals last week by derm, they were not sure if they should leave the sites dry or cover with ointment.    Habits & Providers  Alcohol-Tobacco-Diet     Tobacco Status: never  Current Medications (verified): 1)  Alprazolam 0.5 Mg Tabs (Alprazolam) .... Take 1 Tablet By Mouth Twice A Day (May Fill On or After 12/02/09) 2)  Coumadin 5 Mg Tabs (Warfarin Sodium) .... Take 1 Tablet By Mouth As Directed 3)  Digoxin 0.125 Mg Tabs (Digoxin) .Marland Kitchen.. 1 Tab By Mouth Daily 4)  Ferrous Sulfate 325 (65 Fe) Mg Tabs (Ferrous Sulfate) .... Take 1 Tablet By Mouth Daily 5)  Lasix 20 Mg Tabs (Furosemide) .... Take 1 Tablet By Mouth Once A Day 6)  Atenolol 50 Mg Tabs (Atenolol) .Marland Kitchen.. 1 1/2 Tabs By Mouth Daily 7)  Omeprazole 20 Mg Cpdr (Omeprazole) .... Take 1 Capsule By Mouth Bid 8)  Vicodin 5-500 Mg Tabs (Hydrocodone-Acetaminophen) .... Take 1 Tablet By Mouth Twice A Day(May Fill On or After 12/02/09) 9)   Nasonex 50 Mcg/act  Susp (Mometasone Furoate) .... 2 Sprays Each Nostril Daily 10)  Glucophage 1000 Mg  Tabs (Metformin Hcl) .... One Tab By Mouth Bid 11)  Calcium 600+d 600-400 Mg-Unit Tabs (Calcium Carbonate-Vitamin D) .Marland Kitchen.. 1 Tab By Mouth Two Times A Day For Bones 12)  Cyanocobalamin 1000 Mcg/ml Soln (Cyanocobalamin) .Marland Kitchen.. 1022mcg Deep Subcutaneously or Im Weekly X 8 Weeks Then Monthly 13)  Fish Oil 1000 Mg Caps (Omega-3 Fatty Acids) .... 2 Caps By Mouth Daily 14)  Pravastatin Sodium 20 Mg Tabs (Pravastatin Sodium) .Marland Kitchen.. 1 Tab By Mouth Every Other Day 15)  Amlodipine Besy-Benazepril Hcl 10-20 Mg Caps (Amlodipine Besy-Benazepril Hcl) .Marland Kitchen.. 1 By Daily For Blood Pressure  Allergies (verified): No Known Drug Allergies  Physical Exam  General:  Alert, female, not anxiouis appearing Lungs:  normal respiratory effort and normal breath sounds.   Heart:  normal rate and regular rhythm.   Skin:  multiple actinic changes on sun exposed areas.  Two sites of removal on tip of nose and right arm.  Both were dry and hard.   Impression & Recommendations:  Problem # 1:  ANXIETY DEPRESSION (ICD-300.4) Has been on Xanax for years, was not convinced that lorazepam would be a better  choice.  She has a 2 months supply and will follow up with primary MD then. Orders: [facility]- Est Level  3 [id number])  Problem # 2:  OSTEOARTHRITIS, KNEES, BILATERAL (ICD-715.96) refilled pain meds, again has been on for years, the no change in dosage. Her updated medication list for this problem includes:    Vicodin 5-500 Mg Tabs (Hydrocodone-acetaminophen) .Marland Kitchen... Take 1 tablet by mouth twice a day(may fill on or after 12/02/09)  Orders: [facility]- Est Level  3 [id number])  Complete Medication List: 1)  Alprazolam 0.5 Mg Tabs (Alprazolam) .... Take 1 tablet by mouth twice a day (may fill on or after 12/02/09) 2)  Coumadin 5 Mg Tabs (Warfarin sodium) .... Take 1 tablet by mouth as directed 3)  Digoxin 0.125 Mg Tabs (Digoxin) .Marland Kitchen.. 1 tab by  mouth daily 4)  Ferrous Sulfate 325 (65 Fe) Mg Tabs (Ferrous sulfate) .... Take 1 tablet by mouth daily 5)  Lasix 20 Mg Tabs (Furosemide) .... Take 1 tablet by mouth once a day 6)  Atenolol 50 Mg Tabs (Atenolol) .Marland Kitchen.. 1 1/2 tabs by mouth daily 7)  Omeprazole 20 Mg Cpdr (Omeprazole) .... Take 1 capsule by mouth bid 8)  Vicodin 5-500 Mg Tabs (Hydrocodone-acetaminophen) .... Take 1 tablet by mouth twice a day(may fill on or after 12/02/09) 9)  Nasonex 50 Mcg/act Susp (Mometasone furoate) .... 2 sprays each nostril daily 10)  Glucophage 1000 Mg Tabs (Metformin hcl) .... One tab by mouth bid 11)  Calcium 600+d 600-400 Mg-unit Tabs (Calcium carbonate-vitamin d) .Marland Kitchen.. 1 tab by mouth two times a day for bones 12)  Cyanocobalamin 1000 Mcg/ml Soln (Cyanocobalamin) .Marland Kitchen.. 1045mcg deep subcutaneously or im weekly x 8 weeks then monthly 13)  Fish Oil 1000 Mg Caps (Omega-3 fatty acids) .... 2 caps by mouth daily 14)  Pravastatin Sodium 20 Mg Tabs (Pravastatin sodium) .Marland Kitchen.. 1 tab by mouth every other day 15)  Amlodipine Besy-benazepril Hcl 10-20 Mg Caps (Amlodipine besy-benazepril hcl) .Marland Kitchen.. 1 by daily for blood pressure  Patient Instructions: 1)  Dr. Zigmund Daniel the beginning of November Prescriptions: VICODIN 5-500 MG TABS (HYDROCODONE-ACETAMINOPHEN) Take 1 tablet by mouth twice a day(may fill on or after 12/02/09) Brand medically necessary #60 x 0   Entered and Authorized by:   Tereasa Coop NP   Signed by:   Tereasa Coop NP on 11/02/2009   Method used:   Print then Give to Patient   RxID:   [id number] VICODIN 5-500 MG TABS (HYDROCODONE-ACETAMINOPHEN) Take 1 tablet by mouth twice a day(may fill on or after 11/02/09) Brand medically necessary #60 x 0   Entered and Authorized by:   Tereasa Coop NP   Signed by:   Tereasa Coop NP on 11/02/2009   Method used:   Print then Give to Patient   RxID:   [id number] ALPRAZOLAM 0.5 MG TABS (ALPRAZOLAM) Take 1 tablet by mouth twice a day (may fill on or after 12/02/09)  Brand medically necessary #60 x 0   Entered and Authorized by:   Tereasa Coop NP   Signed by:   Tereasa Coop NP on 11/02/2009   Method used:   Print then Give to Patient   RxID:   [id number]

## 2010-03-29 NOTE — Consult Note (Signed)
Summary: [facility]  [facility]   Imported By: Beryle Lathe 06/14/2009 17:08:21  _____________________________________________________________________  External Attachment:    Type:   Image     Comment:   External Document

## 2010-03-29 NOTE — Miscellaneous (Signed)
Summary: Medical records req  Clinical Lists Changes  rec'd medical record request to go to [employer] picked up 10/22/2009 [facility]  January 26, 2010 1:43 PM

## 2010-03-29 NOTE — Miscellaneous (Signed)
Summary: [name]  Clinical Lists Changes  Problems: Added new problem of [name] (ICD-305.1) 

## 2010-03-29 NOTE — Assessment & Plan Note (Signed)
Summary: f/u,df   Vital Signs:  Patient profile:   77 year old female Height:      64 inches Weight:      155 pounds BMI:     26.70 Pulse rate:   71 / minute BP sitting:   130 / 60  (right arm)  Vitals Entered By: Mauricia Area CMA, (April 06, 2009 1:46 PM) CC: f/up DM and refill pain meds. Is Patient Diabetic? Yes Pain Assessment Patient in pain? yes     Location: back Intensity: 9 Onset of pain  Chronic   Primary Care Provider:  Ria Bush  MD  CC:  f/up DM and refill pain meds..  History of Present Illness: CC: f/u issues.  1. Depression - states stopped prozac because didnt' feel it was doing any good.  not interseted in new med, states depression stable off meds.  2. DM - A1c 6.9 --> 6.6 today.  Metformin 1000mg  two times a day.  Compliant denies hypoglycemic sxs.  No log today.  3. HTN - stable.  no HA, CP.  Again unsure what meds she's on.  Asked her to bring all meds to next appt.  Cardiologist is Dr. Rollene Fare and seems to fill all her scripts.  4. GERD - states previously on omeprazole 20mg  two times a day, but sent in as daily and only 30.  Ran out early, since then has started feeling burning epigastric pain and requests to have it changed back to two times a day, #60.  5. HLD - on fish oil, has sheet that says simvastatin 20.  but duesnt' think she's on this.  Again, asked to bring meds to next visit.  6. OA - severe L knee, very hesitant to get surgery, doing well with occasional steroid injections and vicodin.  also with back pain, chronic.  takes vicodin.  7. anxiety - does well with xanax, >45 yrs.  wants refill.  not interested in changing this med.  8. valve replacement - on coumadin for this.  gets INR checked closer to home, followed by cards.  9. skin - Multiple AKs and solar skin changes BUE.    10. preventative - lost stool cards.  did not make appt for mammography.  no more paps necessary.  Difficulty coming to appts because of  transportation.  med rec by pt recall.  Habits & Providers  Alcohol-Tobacco-Diet     Tobacco Status: never  -  Date:  03/08/2009    Flu vaccine given   Current Medications (verified): 1)  Alprazolam 0.5 Mg Tabs (Alprazolam) .... Take 1 Tablet By Mouth Twice A Day 2)  Benazepril Hcl 20 Mg Tabs (Benazepril Hcl) .Marland Kitchen.. 1 Tab By Mouth Daily 3)  Coumadin 5 Mg Tabs (Warfarin Sodium) .... Take 1 Tablet By Mouth As Directed 4)  Digoxin 0.125 Mg Tabs (Digoxin) .Marland Kitchen.. 1 Tab By Mouth Daily 5)  Ferrous Sulfate 325 (65 Fe) Mg Tabs (Ferrous Sulfate) .... Take 1 Tablet By Mouth Daily 6)  Lasix 20 Mg Tabs (Furosemide) .... Take 1 Tablet By Mouth Once A Day 7)  Atenolol 50 Mg Tabs (Atenolol) .Marland Kitchen.. 1 1/2 Tabs By Mouth Daily 8)  Omeprazole 20 Mg Cpdr (Omeprazole) .... Take 1 Capsule By Mouth Bid 9)  Vicodin 5-500 Mg Tabs (Hydrocodone-Acetaminophen) .... Take 1 Tablet By Mouth Twice A Day 10)  Nasonex 50 Mcg/act  Susp (Mometasone Furoate) .... 2 Sprays Each Nostril Daily 11)  Glucophage 1000 Mg  Tabs (Metformin Hcl) .... One Tab By Mouth  Bid 12)  Calcium 600+d 600-400 Mg-Unit Tabs (Calcium Carbonate-Vitamin D) .Marland Kitchen.. 1 Tab By Mouth Two Times A Day For Bones 13)  Cyanocobalamin 1000 Mcg/ml Soln (Cyanocobalamin) .Marland Kitchen.. 1067mcg Deep Subcutaneously or Im Weekly X 8 Weeks Then Monthly 14)  Fish Oil 1000 Mg Caps (Omega-3 Fatty Acids) .... 2 Caps By Mouth Daily  Allergies (verified): No Known Drug Allergies  Past History:  Past medical, surgical, family and social histories (including risk factors) reviewed for relevance to current acute and chronic problems.  Past Medical History: Reviewed history from 10/23/2007 and no changes required. microalbuminuria 11/03 h/o CHF--last ECHO [id number] systolic fxn, mild pulm HTN microcytic anemia- hgb baseline 11.0,--refuses eval  Hx of endometrial mass (polyp) 8/02 bleeding ->D&C,  idiopathic hypertrophic subaortic stenosis,  [facility], negative  myocardial biopsy for amyloid, noncritical coronary disease (40%LAD) 6/04,  paroxysmal atrial fibrillation and sinus pauses - now c pacer Type II Dm Hyperlipidemia depression/ anxiety chronic back pain -spinal stenosis and L4-S1 disc protrusion - 02/28/2003,  hosp 5/08 GBS bacteremia  Past Surgical History: Reviewed history from 04/26/2006 and no changes required. aortic valve replacement - pericardial valve - 07/29/2002, Cardiac Cath-LAD 30%stenosis, severe concentric LVH - 10/28/2000, Cholecystectomy/ lap - 03/15/2004, D & C - 10/28/2000, Echo 10/04- nml aortic valve, nml EF(50%) - 09/17/2002, Echo 6/03  no sig AS; Diastolic dysfxn; EF 123456 - 09/04/2001, endoscopy: - 06/01/2000, ETT - 02/27/1997, H pylori pos (Tx`d) - 02/27/1998, Lumbar CT- some spinal stenosis and L4-S1 disc protrusion - 02/28/2003, [employer] pacemaker - 06/28/2003, pelvic u/s--?polypoid utx mass, stripe: 8.54mm - 05/17/2000, sonohystogram: - 06/01/2000, UGI Series  WNL - 07/29/1994  Family History: Reviewed history from 09/12/2006 and no changes required. Brother died CAD at 73yo.  Mom alive at 56 w/ DM, pacemaker, Dad died 10 of COPD.  Sister died in 57`s unknown cause. Dtr with DM, bipolar  Social History: Reviewed history from 11/03/2008 and no changes required. Widowed homemaker.  Uses snuff >16yrs, no desire to quit.  Never smoked.  Not sexually active.  Lives w/ dtr, 4 grandchildren. Son died in [place]. Has 2 other dtrs, 1 other son in area.  Pt has a total of 5 children and 15 grandchildren.  limited trasportation (doesn't drive long distances, and lives in [place]).  Receives some medical care at closer clinic.Smoking Status:  never  Physical Exam  General:  elderly, thin female, NAD.  vitals reveiwed Lungs:  CTAB, no c/w Heart:  systolic mechanical murmur Abdomen:  Bowel sounds positive,abdomen soft and non-tender without masses, organomegaly or hernias noted. Extremities:  no edema Skin:  multiple AKs BUE and LE, cutaneous horn left  foreamr, s/p biopsies by dermatologist.  solar elastosis   Impression & Recommendations:  Problem # 1:  GERD (ICD-530.81)  increased to 20 two times a day, #60.  advised if this doesn't imporve sxs, to return for further evaluation. Her updated medication list for this problem includes:    Omeprazole 20 Mg Cpdr (Omeprazole) .Marland Kitchen... Take 1 capsule by mouth bid  Orders: [place]- Est  Level 4 [id number])  Problem # 2:  HYPERTENSION, BENIGN (ICD-401.1)  unsure medical regimen for this.  advised to bring in meds to next appt. Her updated medication list for this problem includes:    Benazepril Hcl 20 Mg Tabs (Benazepril hcl) .Marland Kitchen... 1 tab by mouth daily    Lasix 20 Mg Tabs (Furosemide) .Marland Kitchen... Take 1 tablet by mouth once a day    Atenolol 50 Mg Tabs (Atenolol) .Marland KitchenMarland KitchenMarland KitchenMarland Kitchen  1 1/2 tabs by mouth daily  BP today: 130/60 Prior BP: 126/62 (11/03/2008)  Labs Reviewed: K+: 4.3 (08/20/2007) Creat: : 0.80 (08/20/2007)   LDL: 205 (10/23/2007)     Orders: [place]- Est  Level 4 (99214)  Problem # 3:  DM (ICD-250.00) a1c stable.  unsure if on ACEI.  blood work today.   Her updated medication list for this problem includes:    Benazepril Hcl 20 Mg Tabs (Benazepril hcl) .Marland Kitchen... 1 tab by mouth daily    Glucophage 1000 Mg Tabs (Metformin hcl) ..... 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  INR visits at [facility] close to home.  Problem # 6:  ANXIETY DEPRESSION (ICD-300.4) off prozac per  patient preference.  continued xanax.  Complete Medication List: 1)  Alprazolam 0.5 Mg Tabs (Alprazolam) .... Take 1 tablet by mouth twice a day 2)  Benazepril Hcl 20 Mg Tabs (Benazepril hcl) .Marland Kitchen.. 1 tab by mouth daily 3)  Coumadin 5 Mg Tabs (Warfarin sodium) .... Take 1 tablet by mouth as directed 4)  Digoxin 0.125 Mg Tabs (Digoxin) .Marland Kitchen.. 1 tab by mouth daily 5)  Ferrous Sulfate 325 (65 Fe) Mg Tabs (Ferrous sulfate) .... Take 1 tablet by mouth daily 6)  Lasix 20 Mg Tabs (Furosemide) .... Take 1 tablet by mouth once a day 7)  Atenolol 50 Mg Tabs (Atenolol) .Marland Kitchen.. 1 1/2 tabs by mouth daily 8)  Omeprazole 20 Mg Cpdr (Omeprazole) .... Take 1 capsule by mouth bid 9)  Vicodin 5-500 Mg Tabs (Hydrocodone-acetaminophen) .... Take 1 tablet by mouth twice a day 10)  Nasonex 50 Mcg/act Susp (Mometasone furoate) .... 2 sprays each nostril daily 11)  Glucophage 1000 Mg Tabs (Metformin hcl) .... One tab by mouth bid 12)  Calcium 600+d 600-400 Mg-unit Tabs (Calcium carbonate-vitamin d) .Marland Kitchen.. 1 tab by mouth two times a day for bones 13)  Cyanocobalamin 1000 Mcg/ml Soln (Cyanocobalamin) .Marland Kitchen.. 1033mcg deep subcutaneously or im weekly x 8 weeks then monthly 14)  Fish Oil 1000 Mg Caps (Omega-3 fatty acids) .... 2 caps by mouth daily  Other Orders: Mammogram (Screening) (Mammo)  Patient Instructions: 1)  Return in 3-4 weeks for f/u. 2)  Complete stool cards and mail in. 3)  Call to schedule a mammogram when you have more ease with transportation. 4)  Call your heart doctor for an appointment in the near future. 5)  I'm glad you got your flu shot. 6)  Remember to bring all meds to next visit. 7)  I have refilled 4 medicines today - vicodin, xanax, omeprazole (twice daily) and metformin.  Let me know if you need any more. 8)  If the reflux symptoms don't improve with twice daily dosing, please return to be seen. 9)  Call clinic with questions. Pleasure to see you today! Prescriptions: GLUCOPHAGE 1000 MG  TABS  (METFORMIN HCL) one tab by mouth bid  #60 x 3   Entered and Authorized by:   Ria Bush  MD   Signed  by:   Ria Bush  MD on 04/06/2009   Method used:   Print then Give to Patient   RxID:   [id number] OMEPRAZOLE 20 MG CPDR (OMEPRAZOLE) Take 1 capsule by mouth bid  #60 x 3   Entered and Authorized by:   Ria Bush  MD   Signed by:   Ria Bush  MD on 04/06/2009   Method used:   Print then Give to Patient   RxID:   [id number] VICODIN 5-500 MG TABS (HYDROCODONE-ACETAMINOPHEN) Take 1 tablet by mouth twice a day  #60 x 3   Entered and Authorized by:   Ria Bush  MD   Signed by:   Ria Bush  MD on 04/06/2009   Method used:   Print then Give to Patient   RxID:   [id number] ALPRAZOLAM 0.5 MG TABS (ALPRAZOLAM) Take 1 tablet by mouth twice a day  #60 x 3   Entered and Authorized by:   Ria Bush  MD   Signed by:   Ria Bush  MD on 04/06/2009   Method used:   Print then Give to Patient   RxID:   [id number]   Laboratory Results   Blood Tests   Date/Time Received: April 06, 2009 1:54 PM  Date/Time Reported: April 06, 2009 2:07 PM   HGBA1C: 6.6%   (Normal Range: Non-Diabetic - 3-6%   Control Diabetic - 6-8%)  Comments: ...........test performed by..........Marland Kitchen Amado Nash, SMA       Prevention & Chronic Care Immunizations   Influenza vaccine: given   (03/08/2009)   Influenza vaccine due: 10/28/2009    Tetanus booster: 04/27/2001: Done.   Tetanus booster due: 04/28/2011    Pneumococcal vaccine: Done.  (02/27/2005)   Pneumococcal vaccine due: None    H. zoster vaccine: Not documented  Colorectal Screening   Hemoccult: Done.  (06/28/2002)   Hemoccult action/deferral: Ordered  (11/03/2008)   Hemoccult due: Not Indicated    Colonoscopy: refused  (11/22/2007)   Colonoscopy due: Refused  (11/22/2007)  Other Screening   Pap smear: normal  (05/18/2005)   Pap smear action/deferral: Not indicated-other   (11/03/2008)   Pap smear due: Not Indicated    Mammogram: Done.  (06/27/2001)   Mammogram action/deferral: Ordered  (04/06/2009)   Mammogram due: 06/28/2002    DXA bone density scan: Done.  (03/31/2003)   DXA scan due: None    Smoking status: never  (04/06/2009)  Diabetes Mellitus   HgbA1C: 6.6  (04/06/2009)   Hemoglobin A1C due: 11/20/2007    Eye exam: normal  (05/29/2007)   Eye exam due: 05/28/2008    Foot exam: yes  (11/03/2008)   High risk foot: Not documented   Foot care education: Not documented   Foot exam due: 04/04/2008    Urine microalbumin/creatinine ratio: Not documented   Urine microalbumin/cr due: 03/26/2009  Lipids   Total Cholesterol: Not documented   LDL: 205  (10/23/2007)   LDL Direct: 205  (10/23/2007)   HDL: Not documented   Triglycerides: Not documented   Lipid panel due: 12/03/2008    SGOT (AST): 17  (08/20/2007)   SGPT (ALT): 14  (08/20/2007)   Alkaline phosphatase: 45  (08/20/2007)   Total bilirubin: 0.5  (08/20/2007)   Liver panel due: 12/03/2008  Hypertension   Last Blood Pressure: 130 / 60  (04/06/2009)   Serum creatinine: 0.80  (08/20/2007)   Serum potassium 4.3  ([id number])   Basic metabolic panel due: 123456  Self-Management Support :   Personal Goals (  by the next clinic visit) :     Personal A1C goal: 7  (11/03/2008)     Personal blood pressure goal: 140/90  (11/03/2008)     Personal LDL goal: 100  (11/03/2008)    Diabetes self-management support: Not documented    Hypertension self-management support: Not documented    Lipid self-management support: Not documented    Nursing Instructions: Schedule screening mammogram (see order)   Appended Document: f/u,df    Clinical Lists Changes  Observations: Added new observation of LIPSMSUPP: Written self-care plan (04/06/2009 18:28) Added new observation of HTN PROGRESS: At goal (04/06/2009 18:28) Added new observation of HTN FSREVIEW: Yes (04/06/2009 18:28) Added  new observation of LIPID PROGRS: Unchanged (04/06/2009 18:28) Added new observation of LIPID FSREVW: Yes (04/06/2009 18:28) Added new observation of DM PROGRESS: At goal (04/06/2009 18:28) Added new observation of DM FSREVIEW: Yes (04/06/2009 18:28)       Prevention & Chronic Care Immunizations   Influenza vaccine: given   (03/08/2009)   Influenza vaccine due: 10/28/2009    Tetanus booster: 04/27/2001: Done.   Tetanus booster due: 04/28/2011    Pneumococcal vaccine: Done.  (02/27/2005)   Pneumococcal vaccine due: None    H. zoster vaccine: Not documented  Colorectal Screening   Hemoccult: Done.  (06/28/2002)   Hemoccult action/deferral: Ordered  (11/03/2008)   Hemoccult due: Not Indicated    Colonoscopy: refused  (11/22/2007)   Colonoscopy due: Refused  (11/22/2007)  Other Screening   Pap smear: normal  (05/18/2005)   Pap smear action/deferral: Not indicated-other  (11/03/2008)   Pap smear due: Not Indicated    Mammogram: Done.  (06/27/2001)   Mammogram action/deferral: Ordered  (04/06/2009)   Mammogram due: 06/28/2002    DXA bone density scan: Done.  (03/31/2003)   DXA scan due: None    Smoking status: never  (04/06/2009)  Diabetes Mellitus   HgbA1C: 6.6  (04/06/2009)   Hemoglobin A1C due: 11/20/2007    Eye exam: normal  (05/29/2007)   Eye exam due: 05/28/2008    Foot exam: yes  (11/03/2008)   High risk foot: Not documented   Foot care education: Not documented   Foot exam due: 04/04/2008    Urine microalbumin/creatinine ratio: Not documented   Urine microalbumin/cr due: 03/26/2009    Diabetes flowsheet reviewed?: Yes   Progress toward A1C goal: At goal  Lipids   Total Cholesterol: Not documented   LDL: 205  (10/23/2007)   LDL Direct: 205  (10/23/2007)   HDL: Not documented   Triglycerides: Not documented   Lipid panel due: 12/03/2008    SGOT (AST): 17  (08/20/2007)   SGPT (ALT): 14  (08/20/2007)   Alkaline phosphatase: 45  (08/20/2007)    Total bilirubin: 0.5  (08/20/2007)   Liver panel due: 12/03/2008    Lipid flowsheet reviewed?: Yes   Progress toward LDL goal: Unchanged  Hypertension   Last Blood Pressure: 130 / 60  (04/06/2009)   Serum creatinine: 0.80  (08/20/2007)   Serum potassium 4.3  ([id number])   Basic metabolic panel due: 123456    Hypertension flowsheet reviewed?: Yes   Progress toward BP goal: At goal  Self-Management Support :   Personal Goals (by the next clinic visit) :     Personal A1C goal: 7  (11/03/2008)     Personal blood pressure goal: 140/90  (11/03/2008)     Personal LDL goal: 100  (11/03/2008)    Diabetes self-management support: Not documented    Hypertension self-management support: Not documented  Lipid self-management support: Written self-care plan  (04/06/2009)   Lipid self-care plan printed.

## 2010-03-31 NOTE — Progress Notes (Signed)
  Phone Note Call from Patient   Caller: Patient Call For: [phone] Summary of Call: Pt told she needed an appt with primary before refills.  Please look at your schedule and inform where to doublebook so that refills can be  given Initial call taken by: Eusebio Friendly,  February 07, 2010 3:34 PM

## 2010-03-31 NOTE — Assessment & Plan Note (Signed)
Summary: Refills/FU   Vital Signs:  Patient profile:   77 year old female Height:      64 inches Weight:      150 pounds BMI:     25.84 Pulse rate:   64 / minute BP sitting:   128 / 70  (right arm)  Vitals Entered By: Mauricia Area CMA, (February 11, 2010 3:35 PM) CC: refill meds. f/up DM/HTN Is Patient Diabetic? Yes Pain Assessment Patient in pain? yes     Location: back Intensity: 8 Onset of pain  Chronic   Primary Dabid [place]:  Luetta Nutting DO  CC:  refill meds. f/up DM/HTN.  History of Present Illness: Pt. here for refills and following up on on  1. DM:  Patient feels like her diabetes is well controlled, however she has recently been out of her diabetes medications.  No episodes of low blood sugar.  Does not check her blood glucose at home often.  No changes in diet or exercise recently.  2.Hypertension- Blood pressure well controlled, recently had ECHO done at cardiologists office.  No symptoms of blurred vision, chest pain, headaches.  Currently taking atenolol and amlodipine-benazepril. 3.Anxiety/Depression: Controlled with xanax, has been on for numerouse years.  States without this she starts to feel very "shaky and panicked"  Has been talked to about longer acting medications in the past.  Does not want any other medication other than xanax.    Current Medications (verified): 1)  Alprazolam 0.5 Mg Tabs (Alprazolam) .... Take 1 Tablet By Mouth Twice A Day (May Fill On or After 12/02/09) 2)  Coumadin 5 Mg Tabs (Warfarin Sodium) .... Take 1 Tablet By Mouth As Directed 3)  Digoxin 0.125 Mg Tabs (Digoxin) .Marland Kitchen.. 1 Tab By Mouth Daily 4)  Ferrous Sulfate 325 (65 Fe) Mg Tabs (Ferrous Sulfate) .... Take 1 Tablet By Mouth Daily 5)  Lasix 20 Mg Tabs (Furosemide) .... Take 1 Tablet By Mouth Once A Day 6)  Atenolol 50 Mg Tabs (Atenolol) .Marland Kitchen.. 1 1/2 Tabs By Mouth Daily 7)  Omeprazole 20 Mg Cpdr (Omeprazole) .... Take 1 Capsule By Mouth Bid 8)  Vicodin 5-500 Mg Tabs  (Hydrocodone-Acetaminophen) .... Take 1 Tablet By Mouth Twice A Day(May Fill On or After 12/02/09) 9)  Nasonex 50 Mcg/act  Susp (Mometasone Furoate) .... 2 Sprays Each Nostril Daily 10)  Glucophage 1000 Mg  Tabs (Metformin Hcl) .... One Tab By Mouth Bid 11)  Calcium 600+d 600-400 Mg-Unit Tabs (Calcium Carbonate-Vitamin D) .Marland Kitchen.. 1 Tab By Mouth Two Times A Day For Bones 12)  Cyanocobalamin 1000 Mcg/ml Soln (Cyanocobalamin) .Marland Kitchen.. 1029mcg Deep Subcutaneously or Im Weekly X 8 Weeks Then Monthly 13)  Fish Oil 1000 Mg Caps (Omega-3 Fatty Acids) .... 2 Caps By Mouth Daily 14)  Pravastatin Sodium 20 Mg Tabs (Pravastatin Sodium) .Marland Kitchen.. 1 Tab By Mouth Every Other Day 15)  Amlodipine Besy-Benazepril Hcl 10-20 Mg Caps (Amlodipine Besy-Benazepril Hcl) .Marland Kitchen.. 1 By Daily For Blood Pressure  Allergies (verified): No Known Drug Allergies  Review of Systems       Pertinent positives and negatives noted in HPI, Vitals signs noted   Physical Exam  General:  Alert, female, not anxiouis appearing Eyes:  pupils equal, pupils round, and pupils reactive to light.   Lungs:  normal respiratory effort and normal breath sounds.   Heart:  normal rate and regular rhythm.  grade  99991111 systolic murmur.   Abdomen:  soft, non-tender, normal bowel sounds, no distention, no masses, and no guarding.   Skin:  small amount of bruising on hands, no large areas of bruising seen   Impression & Recommendations:  Problem # 1:  DM (ICD-250.00) A1C slightly worse than previous but to be expected since she has been out of medication for a few weeks.  No medication adjustments, will have her f/u in three months for re-check of A1c Her updated medication list for this problem includes:    Glucophage 1000 Mg Tabs (Metformin hcl) ..... One tab by mouth bid    Amlodipine Besy-benazepril Hcl 10-20 Mg Caps (Amlodipine besy-benazepril hcl) .Marland Kitchen... 1 by daily for blood pressure  Orders: A1C-[facility] [id number])  Problem # 2:  HYPERTENSION, BENIGN  (ICD-401.1) Well controlled will continue on current regimen.  ECHO done at cardiologist seems to be WNL.  No red flag symptoms.  Her updated medication list for this problem includes:    Lasix 20 Mg Tabs (Furosemide) .Marland Kitchen... Take 1 tablet by mouth once a day    Atenolol 50 Mg Tabs (Atenolol) .Marland Kitchen... 1 1/2 tabs by mouth daily    Amlodipine Besy-benazepril Hcl 10-20 Mg Caps (Amlodipine besy-benazepril hcl) .Marland Kitchen... 1 by daily for blood pressure  Problem # 3:  ANXIETY DEPRESSION (ICD-300.4) Counseled on different types onf anti anxiety medications.  Patient adamant that she wants to stay on xanax as this is what she has been on for years.  Warned that she should take extra precautions when ambulating after taking this because of increased fall risk, especially with coumadin use.  Advised that if she is feeling anxious enough to need this she should find a place to sit down and try to relax to avoid any fall risks in the future.  Orders: [facility] [place]- Est Level  3 [id number])  Complete Medication List: 1)  Alprazolam 0.5 Mg Tabs (Alprazolam) .... Take 1 tablet by mouth twice a day (may fill on or after 12/02/09) 2)  Coumadin 5 Mg Tabs (Warfarin sodium) .... Take 1 tablet by mouth as directed 3)  Digoxin 0.125 Mg Tabs (Digoxin) .Marland Kitchen.. 1 tab by mouth daily 4)  Ferrous Sulfate 325 (65 Fe) Mg Tabs (Ferrous sulfate) .... Take 1 tablet by mouth daily 5)  Lasix 20 Mg Tabs (Furosemide) .... Take 1 tablet by mouth once a day 6)  Atenolol 50 Mg Tabs (Atenolol) .Marland Kitchen.. 1 1/2 tabs by mouth daily 7)  Omeprazole 20 Mg Cpdr (Omeprazole) .... Take 1 capsule by mouth bid 8)  Vicodin 5-500 Mg Tabs (Hydrocodone-acetaminophen) .... Take 1 tablet by mouth twice a day(may fill on or after 12/02/09) 9)  Nasonex 50 Mcg/act Susp (Mometasone furoate) .... 2 sprays each nostril daily 10)  Glucophage 1000 Mg Tabs (Metformin hcl) .... One tab by mouth bid 11)  Calcium 600+d 600-400 Mg-unit Tabs (Calcium carbonate-vitamin d) .Marland Kitchen.. 1 tab by mouth two  times a day for bones 12)  Cyanocobalamin 1000 Mcg/ml Soln (Cyanocobalamin) .Marland Kitchen.. 1039mcg deep subcutaneously or im weekly x 8 weeks then monthly 13)  Fish Oil 1000 Mg Caps (Omega-3 fatty acids) .... 2 caps by mouth daily 14)  Pravastatin Sodium 20 Mg Tabs (Pravastatin sodium) .Marland Kitchen.. 1 tab by mouth every other day 15)  Amlodipine Besy-benazepril Hcl 10-20 Mg Caps (Amlodipine besy-benazepril hcl) .Marland Kitchen.. 1 by daily for blood pressure  Patient Instructions: 1)  Nice seeing you again today. 2)  Your A1C is up a slight amount today from last time but thats understandable since you were out of your diabetes medications. 3)  I have given you a prescription to refill your xanax and sent over  a prescription for prilosec to your pharmacy.  Do not take the protonix with the prilosec. 4)  I would like to see you back in three months to re-check your A1C, we will recheck some of your other labs at that time too since they were last checked in February.   Prescriptions: AMLODIPINE BESY-BENAZEPRIL HCL 10-20 MG CAPS (AMLODIPINE BESY-BENAZEPRIL HCL) 1 by daily for blood pressure  #30.0 Capsule x 2   Entered and Authorized by:   Luetta Nutting DO   Signed by:   Luetta Nutting DO on 02/14/2010   Method used:   Electronically to        [facility]  [address] [phone][facility] (retail)       [address], [place]  [address]       Ph: [phone] or [phone]       Fax: [phone]   RxID:   [id number] ALPRAZOLAM 0.5 MG TABS (ALPRAZOLAM) Take 1 tablet by mouth twice a day (may fill on or after 12/02/09) Brand medically necessary #60 x 1   Entered and Authorized by:   Luetta Nutting DO   Signed by:   Luetta Nutting DO on 02/11/2010   Method used:   Print then Give to Patient   RxID:   [id number] OMEPRAZOLE 20 MG CPDR (OMEPRAZOLE) Take 1 capsule by mouth bid  #60 x 5   Entered and Authorized by:   Luetta Nutting DO   Signed by:   Luetta Nutting DO on 02/11/2010   Method used:   [facility]          [facility]  [address] [phone][facility] (retail)       [address]       [place], [place]  [address]       Ph: [phone] or [phone]       Fax: [phone]   RxID:   [id number]    Orders Added: 1)  A1C-[facility] [83036] 2)  [facility]- Est Level  3 [99213]    Laboratory Results   Blood Tests   Date/Time Received: February 11, 2010 3:38 PM  Date/Time Reported: February 11, 2010 3:59 PM   HGBA1C: 6.4%   (Normal Range: Non-Diabetic - 3-6%   Control Diabetic - 6-8%)  Comments: .............test performed by..............Marland KitchenSchuyler Amor CMA

## 2010-03-31 NOTE — Progress Notes (Signed)
  Phone Note Refill Request Call back at [phone]   Refills Requested: Medication #1:  VICODIN 5-500 MG TABS Take 1 tablet by mouth twice a day(may fill on or after 12/02/09) [BMN]  Medication #2:  ALPRAZOLAM 0.5 MG TABS Take 1 tablet by mouth twice a day (may fill on or after 12/02/09) [BMN] Initial call taken by: Eusebio Friendly,  February 04, 2010 2:35 PM  Follow-up for Phone Call        Sorry patient needs an office visit before will refill controlled substances as was explained to her in last visit.  Called above number and left message for her to call us  Follow-up by: Talbert Cage MD,  February 04, 2010 3:51 PM

## 2010-04-08 ENCOUNTER — Other Ambulatory Visit: Payer: Self-pay | Admitting: Family Medicine

## 2010-04-08 MED ORDER — HYDROCODONE-ACETAMINOPHEN 5-500 MG PO TABS: 1.0000 | ORAL_TABLET | Freq: Two times a day (BID) | ORAL | Status: DC

## 2010-04-17 ENCOUNTER — Other Ambulatory Visit: Payer: Self-pay | Admitting: Family Medicine

## 2010-04-18 NOTE — Telephone Encounter (Signed)
Please review and refill

## 2010-04-19 NOTE — Telephone Encounter (Signed)
Refilled, will need to see her INR from the outside clinic before continuing to refill

## 2010-04-21 ENCOUNTER — Telehealth: Payer: Self-pay | Admitting: Family Medicine

## 2010-04-21 NOTE — Telephone Encounter (Signed)
RX request has been given to Dr. Nori Riis .  Message left on patient's voicemail that we have received and MD will address as soon as she is able.

## 2010-04-21 NOTE — Telephone Encounter (Signed)
Checking status of rx for xanex, pharmacy faxed a few days ago

## 2010-04-22 NOTE — Telephone Encounter (Signed)
Refill fax was signed and faxed back

## 2010-05-03 ENCOUNTER — Other Ambulatory Visit: Payer: Self-pay | Admitting: Family Medicine

## 2010-05-10 ENCOUNTER — Encounter: Payer: Self-pay | Admitting: Family Medicine

## 2010-05-10 NOTE — Progress Notes (Signed)
  Subjective:    Patient ID: [name], female    DOB: [birth year], 77 y.o.   MRN: [id number]  HPIHydrocodone APAP refilled 60 + 1 refill via fax request    Review of Systems     Objective:   Physical Exam        Assessment & Plan:

## 2010-05-12 LAB — COMPREHENSIVE METABOLIC PANEL
AST: 25 U/L (ref 0–37)
BUN: 16 mg/dL (ref 6–23)
CO2: 29 mEq/L (ref 19–32)
Calcium: 9.3 mg/dL (ref 8.4–10.5)
Chloride: 102 mEq/L (ref 96–112)
Creatinine, Ser: 1.01 mg/dL (ref 0.4–1.2)
GFR calc Af Amer: >60 mL/min (ref >60)
GFR calc non Af Amer: 53 mL/min — ABNORMAL LOW (ref >60)
Total Bilirubin: 0.4 mg/dL (ref 0.3–1.2)

## 2010-05-12 LAB — URINALYSIS, ROUTINE W REFLEX MICROSCOPIC
Hgb urine dipstick: NEGATIVE
Nitrite: NEGATIVE
Protein, ur: NEGATIVE mg/dL
Specific Gravity, Urine: 1.014 (ref 1.005–1.030)
Urobilinogen, UA: 0.2 mg/dL (ref 0.0–1.0)

## 2010-05-12 LAB — DIFFERENTIAL
Basophils Absolute: 0 10*3/uL (ref 0.0–0.1)
Lymphocytes Relative: 17 % (ref 12–46)
Lymphs Abs: 2.3 10*3/uL (ref 0.7–4.0)
Neutro Abs: 10.1 10*3/uL — ABNORMAL HIGH (ref 1.7–7.7)
Neutrophils Relative %: 75 % (ref 43–77)

## 2010-05-12 LAB — CBC
HCT: 36.9 % (ref 36.0–46.0)
Hemoglobin: 12 g/dL (ref 12.0–15.0)
MCH: 26.9 pg (ref 26.0–34.0)
MCV: 82.7 fL (ref 78.0–100.0)
Platelets: 249 10*3/uL (ref 150–400)
RBC: 4.46 MIL/uL (ref 3.87–5.11)

## 2010-05-12 LAB — PROTIME-INR
INR: 1.91 — ABNORMAL HIGH (ref 0.00–1.49)
Prothrombin Time: 22 seconds — ABNORMAL HIGH (ref 11.6–15.2)

## 2010-05-12 LAB — URINE CULTURE: Colony Count: 40000

## 2010-05-16 ENCOUNTER — Other Ambulatory Visit: Payer: Self-pay | Admitting: Family Medicine

## 2010-05-16 DIAGNOSIS — I10 Essential (primary) hypertension: Secondary | ICD-10-CM

## 2010-05-17 NOTE — Telephone Encounter (Signed)
Patient notified that it is time for her follow up appointment . She will call.

## 2010-05-31 ENCOUNTER — Ambulatory Visit (INDEPENDENT_AMBULATORY_CARE_PROVIDER_SITE_OTHER): Payer: PRIVATE HEALTH INSURANCE | Admitting: Family Medicine

## 2010-05-31 VITALS — BP 122/78 | HR 80 | Temp 97.6°F | Ht 64.0 in | Wt 155.3 lb

## 2010-05-31 DIAGNOSIS — J302 Other seasonal allergic rhinitis: Secondary | ICD-10-CM

## 2010-05-31 DIAGNOSIS — J309 Allergic rhinitis, unspecified: Secondary | ICD-10-CM

## 2010-05-31 DIAGNOSIS — T8209XA Other mechanical complication of heart valve prosthesis, initial encounter: Secondary | ICD-10-CM

## 2010-05-31 DIAGNOSIS — E119 Type 2 diabetes mellitus without complications: Secondary | ICD-10-CM

## 2010-05-31 NOTE — Patient Instructions (Signed)
Your A1c looks good today.  I would like to see you back in three months again to re-check.  [facility] your [facility].  If you have questions, please feel free to call our office.

## 2010-06-05 DIAGNOSIS — J302 Other seasonal allergic rhinitis: Secondary | ICD-10-CM | POA: Insufficient documentation

## 2010-06-05 MED ORDER — FEXOFENADINE HCL 60 MG PO TABS: 60.0000 mg | ORAL_TABLET | Freq: Every day | ORAL | Status: DC

## 2010-06-05 NOTE — Assessment & Plan Note (Signed)
Will send in rx for allegra for her to try.

## 2010-06-05 NOTE — Assessment & Plan Note (Signed)
Doing well, slight increase in A1C will make no changes to medications at this time.  F/u in three months.

## 2010-06-05 NOTE — Progress Notes (Signed)
  Subjective:    Patient ID: [name], female    DOB: 08-01-1929, 77 y.o.   MRN: [id number]  HPI Here for follow up on diabetes.  Has been doing well.  Taking medications as directed.  Glucose between 130-180 when she checks them.  No changes in diet or activity recently.  Would also like a medication for seasonal allergies.  Has stuffiness and itchy eyes with cough.   Review of Systems  Constitutional: Negative for activity change and appetite change.  Respiratory: Negative for shortness of breath.   Cardiovascular: Negative for chest pain.       Objective:   Physical Exam  Constitutional: She appears well-developed and well-nourished.  HENT:  Head: Normocephalic and atraumatic.  Cardiovascular: Normal rate and regular rhythm.   Pulmonary/Chest: Effort normal and breath sounds normal.          Assessment & Plan:

## 2010-06-08 ENCOUNTER — Other Ambulatory Visit: Payer: Self-pay | Admitting: Family Medicine

## 2010-06-08 DIAGNOSIS — I10 Essential (primary) hypertension: Secondary | ICD-10-CM

## 2010-06-15 ENCOUNTER — Other Ambulatory Visit: Payer: Self-pay | Admitting: Family Medicine

## 2010-06-15 DIAGNOSIS — F341 Dysthymic disorder: Secondary | ICD-10-CM

## 2010-06-15 MED ORDER — ALPRAZOLAM 0.5 MG PO TABS: 0.5000 mg | ORAL_TABLET | Freq: Two times a day (BID) | ORAL | Status: DC

## 2010-07-04 ENCOUNTER — Encounter: Payer: Self-pay | Admitting: Home Health Services

## 2010-07-08 ENCOUNTER — Other Ambulatory Visit: Payer: Self-pay | Admitting: Family Medicine

## 2010-07-08 DIAGNOSIS — K219 Gastro-esophageal reflux disease without esophagitis: Secondary | ICD-10-CM

## 2010-07-11 ENCOUNTER — Other Ambulatory Visit: Payer: Self-pay | Admitting: Family Medicine

## 2010-07-11 MED ORDER — HYDROCODONE-ACETAMINOPHEN 5-500 MG PO TABS: 1.0000 | ORAL_TABLET | Freq: Two times a day (BID) | ORAL | Status: DC

## 2010-07-11 NOTE — Telephone Encounter (Signed)
Refilled via fax request. 

## 2010-07-12 NOTE — H&P (Signed)
[name], [name]            ACCOUNT NO.:  [id number]   MEDICAL RECORD NO.:  [id number]          PATIENT TYPE:  INP   LOCATION:  [id number]                         FACILITY:  [facility]   PHYSICIAN:  Jamal Collin. Hensel, M.D.DATE OF BIRTH:  21-Apr-1929   DATE OF ADMISSION:  07/13/2006  DATE OF DISCHARGE:                              HISTORY & PHYSICAL   CHIEF COMPLAINT:  Fever and weakness.   HISTORY OF PRESENT ILLNESS:  This patient is a 77 year old female with  multiple medical problems who came to the ER today for fever, nausea and  general malaise.  The patient had been seen by a doctor [place] one  week ago and was diagnosed with a UTI and put on Macrobid.  The patient  was doing okay until today when she developed a temperature of 102.  She  also complains of leg weakness and pain x1 month.  Her cardiologist had  stopped her statin about 1week ago because of this.  In the ED the  patient received 1 L of fluids and felt much better.   REVIEW OF SYSTEMS:  Positive for fever, negative for weight loss.  Negative for chest pain.  Negative for shortness of breath.  Negative  for dyspnea on exertion,  negative for orthopnea.  Positive for nausea,  negative for vomiting.  Negative for dysuria, negative for polyuria,  incontinence.  Negative for rashes.   PAST MEDICAL HISTORY:  1. Type 2 diabetes.  Last hemoglobin A1c 6.8.  2. Hyperlipidemia.  Last LDL 116.  3. Chronic iron-deficiency anemia.  Her baseline hemoglobin is about      11.  4. Anxiety and depression.  5. Hypertension.  6. GERD.  7. Ischemic cardiomyopathy.  8. Paroxysmal atrial fibrillation.  9. Osteoarthritis.  10.Aortic stenosis, status post aortic valve replacement in 2003.      Last echo June 2007 showed a stable prosthetic AV valve, mild MR,      mild increased RV pressure and an EF of greater than 55.   MEDICATIONS:  1. Coumadin 5 mg p.o. daily.  2. Digoxin 0.125 mg p.o. daily.  3. Iron sulfate 325 mg p.o.  daily.  4. Fluoxetine 20 mg p.o. daily.  5. K-Dur 40 mEq p.o. daily.  6. Lasix 20 mg p.o. daily.  7. Lotrel 10/20 mg p.o. daily.  8. Metformin 1000 mg p.o. b.i.d.  9. Metoprolol 50 mg p.o. b.i.d.  10.Prilosec 20 mg p.o. daily.  11.Vicodin p.o. b.i.d.  12.Xanax 0.5 mg p.o. b.i.d. p.r.n.  13.Nitrofurantoin 100 mg p.o. b.i.d.   FAMILY HISTORY:  Brother died of coronary artery disease at age 47.  Mom  is living and has diabetes.  Dad died at 80 of COPD.  She has a daughter  with type 2 diabetes.   SOCIAL HISTORY:  The patient is a widowed homemaker.  She lives with one  of her daughters and her grandchildren.  She had used snuff for 50  years.  She has no desire to quit.  She has never smoked.  She does not  drink or use drugs.   PHYSICAL EXAMINATION:  VITAL SIGNS:  Temperature  102.0, now 100.3, BP  143/62, pulse 81, respirations 18, O2 98% on 2 L.  GENERAL:  The patient is alert and oriented, in no acute distress.  HEENT:  Head normocephalic, atraumatic.  Pupils equal, round and  reactive to light and accommodation.  Extraocular movements intact.  Moist mucous membranes.  NECK:  Supple.  No JVD.  No bruits.  LUNGS:  She has a few basilar crackles.  HEART:  Regular rate and rhythm with a grade 2/6 systolic ejection  murmur at the left sternal border.  ABDOMEN:  Positive bowel sounds, soft, nontender, nondistended.  EXTREMITIES:  Trace edema bilaterally that nonpitting.  SKIN:  Significant for actinic dermatitis.  NEUROLOGIC:  No focal deficits.  Strength 5/5 throughout.  Normal  sensation throughout.   LABS AND TESTS:  Portable chest x-ray shows cardiomegaly, no acute  cardiopulmonary process.  UA is within normal limits.  INR pending.  Blood culture pending.  White count 13.5 with 90% neutrophils,  hemoglobin 10.1, hematocrit 30.9, platelets 309.   ASSESSMENT/PLAN:  This is a 77 year old female multiple medical problems  here with a fever and weakness.   1. Infectious  disease.  The patient has a fever and slightly increased      white blood count but no obvious source of infection.  Chest x-ray and urinalysis are unremarkable.  We will check a renal  ultrasound for obstruction.  The patient is currently hemodynamically  stable.  We will follow her blood cultures.  1. Congestive heart failure/coronary artery disease/aortic valve      replacement.  The patient is slightly hypervolemic status post      fluids.  Will give 20 mg Lasix p.o. tonight, check a BNP in the      morning, continue digoxin.  We will check a digoxin level.      Continue Lotrel and metoprolol.  Continue K-Dur. Will check cardiac      panel x3 given history of myocardial infarction and in her weakness      on presentation to the emergency department.  We will monitor on      telemetry and check an EKG.  The patient's last echo had an      ejection fraction of greater than 55%.  2. Hyperlipidemia.  LDL is not at goal.  It at last check was 116;      however, her cardiologist has stopped her statin temporarily      secondary to lower extremity pain.  3. Anemia.  This is chronic microcytic.  The patient has refused colon      cancer screening multiple times despite recommendations.  Will      check stool guaiacs.  Continue iron sulfate 325 p.o. b.i.d.  No      weight loss or melenic stools.  4. Type 2 diabetes, well-controlled.  Hemoglobin A1c is 6.8.  hold      metformin now because of her nausea.  We will cover her with      sliding scale insulin.  5. Depression and anxiety.  Continue fluoxetine and Xanax.  6. Chronic pain secondary to osteoarthritis.  Continue Vicodin p.r.n..  7. Fluids, electrolytes, and nutrition.  Diabetic diet, KVO IV fluids.  8. Prophylaxis.  The patient will be on Protonix and SCDs.      Kaylyn Layer, M.D.    ______________________________  Jamal Collin. Andria Frames, M.D.   KS/MEDQ  D:  07/13/2006  T:  07/14/2006  Job:  [id number]

## 2010-07-14 ENCOUNTER — Other Ambulatory Visit: Payer: Self-pay | Admitting: Family Medicine

## 2010-07-14 ENCOUNTER — Other Ambulatory Visit: Payer: Self-pay | Admitting: *Deleted

## 2010-07-14 DIAGNOSIS — F341 Dysthymic disorder: Secondary | ICD-10-CM

## 2010-07-14 DIAGNOSIS — I10 Essential (primary) hypertension: Secondary | ICD-10-CM

## 2010-07-14 MED ORDER — ALPRAZOLAM 0.5 MG PO TABS: 0.5000 mg | ORAL_TABLET | Freq: Two times a day (BID) | ORAL | Status: DC

## 2010-07-14 MED ORDER — ATENOLOL 50 MG PO TABS: ORAL_TABLET | ORAL | Status: DC

## 2010-07-15 NOTE — Op Note (Signed)
NAME:  [name], [name]                      ACCOUNT NO.:  [id number]   MEDICAL RECORD NO.:  [id number]                   PATIENT TYPE:  INP   LOCATION:  2307                                 FACILITY:  [facility]   PHYSICIAN:  Finis Bud, M.D.              DATE OF BIRTH:  [birth year]   DATE OF PROCEDURE:  DATE OF DISCHARGE:                                 OPERATIVE REPORT   INDICATION FOR PROCEDURE:  [name] is a 77 year old female who  presents today for aortic valve replacement by Dr. Percell Miller B. Gerhardt.  We  have planned to place a transesophageal echocardiogram probe for evaluation  of cardiac structure and function.   The patient was brought to the holding area the morning of surgery where  pulmonary artery and radial artery lines were placed under local anesthesia  with sedation.  The patient was then taken to the operating room for routine  induction of anesthesia.  The trachea was intubated.  Following intubation  of the trachea, the TEE probe was passed oropharyngeally into the stomach  and then withdrawn for imaging of the cardiac structures.   PRE-CARDIOPULMONARY BYPASS TRANSESOPHAGEAL ECHOCARDIOGRAM:   LEFT VENTRICLE:  The left ventricle was concentrically thickened.  Papillary  muscles were well-outlined.  There were no masses noted within the left  ventricular chamber.  Overall left ventricular function was within normal  limits.   AORTIC VALVE:  The aortic valve revealed moderate calcification.  There was  no significant regurgitant flow.   MITRAL VALVE:  The valve appeared to function appropriately.  There was mild  1+ mitral regurgitant flow on Doppler examination.  There was no reversal of  flow by pulse wave examination.   LEFT ATRIUM:  The left atrial appendage was visualized.  There were no  masses noted.  The interatrial septum appeared intact.   RIGHT VENTRICLE:  Overall, the right ventricle appeared to be within normal  limits.  There was  trace tricuspid regurgitant flow on Doppler examination.   The patient was placed on cardiopulmonary bypass.  Aortic valve replacement  was carried out.  De-airing maneuvers were carried out.  The patient was  rewarmed and separated from cardiopulmonary bypass with the initial attempt.   POST-CARDIOPULMONARY BYPASS TRANSESOPHAGEAL ECHOCARDIOGRAM -- LIMITED EXAM:   LEFT VENTRICLE:  The left ventricle function appeared still normal.  In the  early bypass period, the chamber revealed evidence of low volume but with  replacement of volume, the contractile pattern improved.   AORTIC VALVE:  The valve appeared to be seated well.  There was no  significant regurgitant flow.  There did not appear to be any obstruction to  flow.   The rest of the cardiac exam was as previously described.  The patient was  returned to the cardiac intensive care in stable condition.  Finis Bud, M.D.    CE/MEDQ  D:  08/22/2002  T:  08/23/2002  Job:  [id number]

## 2010-07-15 NOTE — H&P (Signed)
[facility]  Patient:    [name], [name] Visit Number: [id number] MRN: [id number]          Service Type: Attending:  Jerald Kief, M.D. Dictated by:   Jerald Kief, M.D.                           History and Physical  DATE OF BIRTH:                1929-04-23  HISTORY OF PRESENT ILLNESS:   [name] is a 77 year old white female, G6, P5, A1.  She is referred by Dr. Otilio Miu for ultrasound, which showed a normal-appearing uterus.  Sonohistogram shows an endometrial polyp in the anterior endometrium that measures 3 x 8 mm.  Also, a thickened endometrial stripe.  She has had abnormal uterine bleeding the last year.  She presents today for hysteroscopy, D&C for evaluation and treatment of the abnormal bleeding and evaluation of the endometrial mass.  PAST MEDICAL HISTORY:         1. Hypertension.                               2. Hypercholesterolemia.                               3. Anxiety disorder.                               4. Adult-onset diabetes.                               5. She recently underwent a heart                                  catheterization and was released to undergo                                  surgery.  PAST SURGICAL HISTORY:        Tubal ligation.  MEDICATIONS:                  Procardia, Monopril, Pravachol, aspirin, Claritin, Flonase, Prempro which was recently stopped in May because of the abnormal bleeding.  ALLERGIES:                    No known drug allergies.  PHYSICAL EXAMINATION:  VITAL SIGNS:                  Blood pressure 150/90.  HEART:                        Regular rate and rhythm.  LUNGS:                        Clear to auscultation bilaterally.  ABDOMEN:                      Nondistended, nontender.  PELVIC:  Uterus anteverted, mobile, nontender.  Adnexa without palpable mass.  Cervix is grossly normal.  RECTOVAGINAL:                 Confirms  the above.  IMPRESSION AND PLAN:          Abnormal uterine bleeding, having stopped Prempro several months ago.  She does also have an abnormal sonohistogram showing an endometrial polyp.  Recommend hysteroscopy, D&C for evaluation and treatment of the endometrial mass.  The risks and benefits were discussed at length which include but are not limited to risk of infection, bleeding, damage to uterus, tubes, ovaries, bowel, bladder, recurrence of the abnormal bleeding, risks associated with blood transfusion.  The patient does give her informed consent. Dictated by:   Jerald Kief, M.D. Attending:  Jerald Kief, M.D. DD:  01/03/01 TD:  01/03/01 Job: [id number]

## 2010-07-15 NOTE — Op Note (Signed)
[name], [name]            ACCOUNT NO.:  [id number]   MEDICAL RECORD NO.:  [id number]          PATIENT TYPE:  INP   LOCATION:  0101                         FACILITY:  [facility]   PHYSICIAN:  Kathrin Penner, M.D.   DATE OF BIRTH:  [birth year]   DATE OF PROCEDURE:  02/04/2004  DATE OF DISCHARGE:                                 OPERATIVE REPORT   PREOPERATIVE DIAGNOSIS:  Acute cholecystitis.   POSTOPERATIVE DIAGNOSIS:  Acute cholecystitis.   PROCEDURE:  Laparoscopic cholecystectomy with intraoperative cholangiogram.   SURGEON:  Kathrin Penner, M.D.   ASSISTANT:  Nurse.   ANESTHESIA:  General.   INDICATIONS FOR PROCEDURE:  [name] is a 77 year old woman presenting  with right-sided abdominal pain associated with nausea and vomiting.  She  was seen recently in the emergency room where she was diagnosed with  cholelithiasis by an abdominal ultrasound.  She did not have any acute  changes of acute cholecystitis; however, she has continued to have  increasing pain but without any additional nausea and vomiting, no chills or  fever.  On evaluation today, she was noted to have a leukocytosis of 18,000  and is exquisitely tender in the right upper quadrant.  She comes to the  operating room now after the risks and potential benefits of surgery have  been discussed with her.  All questions were answered and consent obtained  for laparoscopic cholecystectomy.  Open cholecystectomy is also a  possibility.  Injury to the common bile duct, infection, bleeding, are  possibilities also, but not limited to these.   DESCRIPTION OF PROCEDURE:  Following the induction of satisfactory general  anesthesia with the patient positioned supine, the abdomen was routinely  prepped and draped to be included in the sterile operative field.   Open laparoscopy was created at the umbilicus with insertion of a Hasson  cannula and insufflation of the peritoneal cavity to 14 mmHg pressure.  The  camera  was inserted.  Immediately noted was a large phlegmon over the  gallbladder extending up onto the edge of the liver.  The liver edges were  otherwise sharp.  The liver surface was smooth.  The duodenum was adhered to  the gallbladder.  The anterior gastric wall and duodenal sweep were  otherwise normal.  None of the small or large intestine viewed appeared to  be abnormal.  Under direct vision, the epigastric and lateral ports were  placed.  The gallbladder was then freed from its adhesions.  It was  extremely _hydropic, red, and inflamed.  It was aspirated of its contents  using Najot aspirator.  The gallbladder was then grasped and retracted  cephalad.  Dissection was carried down, removing the phlegmon from around  the gallbladder and carrying the dissection down to the ampulla.  The area  of the ampulla was dissected, and the cystic artery and cystic duct were  noted.  The cystic artery was traced up to its entry onto the gallbladder  wall, and this was then doubly clipped and transected.  The cystic duct was  traced up to the cystic duct/gallbladder junction and down to the cystic  duct/common duct junction.  The cystic duct was opened.  I inserted a  Reddick catheter through the abdominal wall and injected one-half strength  Hypaque dye into the extrahepatic biliary system through the cystic duct  under fluoroscopic guidance.  The resulting cholangiogram showed prompt flow  of contrast into the duodenum and up into the hepatic radicles.  No filling  defects were noted.  The cystic duct catheter was then removed.  The cystic  duct was triply clipped and transected.  The gallbladder was then dissected  free from the liver bed using electrocautery, maintaining hemostasis  throughout the course of the dissection.  At the end of the dissection, the  liver bed was again inspected for hemostasis.  Additional bleeding points  were treated with electrocautery.  The gallbladder was then placed  in a  pouch and then retrieved through the umbilical wound without difficulty.  The right upper quadrant was then thoroughly irrigated with multiple  aliquots of normal saline.  Sponge and instrument counts were then verified.  Pneumoperitoneum was allowed to deflate after the trocars were removed under  direct vision.  The wounds were closed in layers as follows, the umbilical  wound in two layers with 0 Vicryl and 4-0 Monocryl and the epigastrium and  lateral flank wounds with 4-0 Monocryl sutures.  All wounds were reinforced  with Steri-Strips.  Sterile dressings were applied.   The anesthetic was reversed.  The patient was removed from the operating  room to the recovery room in stable condition.  She tolerated the procedure  well.     Patr   PB/MEDQ  D:  02/04/2004  T:  02/05/2004  Job:  [id number]

## 2010-07-15 NOTE — H&P (Signed)
NAME:  [name], [name]                      ACCOUNT NO.:  [id number]   MEDICAL RECORD NO.:  [id number]                   PATIENT TYPE:  INP   LOCATION:  2105                                 FACILITY:  [place]   PHYSICIAN:  Minette Brine. Tysinger, M.D.              DATE OF BIRTH:  [birth year]   DATE OF ADMISSION:  08/14/2002  DATE OF DISCHARGE:                                HISTORY & PHYSICAL   CHIEF COMPLAINT:  Congestive heart failure and diagnosis from [facility] is acute myocardial infarction.   HISTORY OF PRESENT ILLNESS:  Since 4 o'clock this morning, this 77 year old  white female patient of Dr. Mathis Bud had dyspnea and orthopnea.  She  awakened to go to the bathroom and could not breathe.  This is similar to an  episode of congestive heart failure that she had one year ago.  She called  EMS and was taken to [facility] Emergency Department where she  received IV Lasix and was diuresed quickly.  Respirations improved in about  30 minutes after receiving the Lasix.  She has had a cough but no chest pain  and some diaphoresis only after arriving at the hospital.  Her cardiac  enzymes were abnormal, so the patient was transferred to [facility] to  Dr. Martie Round care.   PAST MEDICAL HISTORY:  1. Congestive heart failure with concentric left ventricular hypertrophy and     left ventricular dysfunction.  2. She has hyperlipidemia.  3. Non-insulin dependent diabetes mellitus.  4. Iron-deficient anemia.  5. Hypertension.  6. Gastroesophageal reflux disorder.  7. Fibromyalgia.  8. Anxiety.  9. Mild coronary artery disease by catheterization with mild to moderate     aortic stenosis and hyperdynamic left ventricle, according to a     catheterization on November 14, 2000 and an echocardiogram August 07, 2001     showed severe concentric left ventricular hypertrophy with an ejection     fraction of 60%.  10.      The patient also has seasonal allergies.   PAST  SURGICAL HISTORY:  Significant only for an ovarian cyst which was  removed.   DRUG ALLERGIES:  The patient has no known drug allergies.   MEDICATIONS:  1. Zocor 40 mg 1 at bedtime.  2. Metformin 500 mg 2 twice a day.  3. Toprol XL 25 mg 1/2 every day.  4. Hydrochlorothiazide 25 mg 1 every day.  5. Furosemide 40 mg 1/2 one day and 1 the next day.  6. Alprazolam 0.5 mg 1 at bedtime.  7. Enteric-coated aspirin 81 mg 1 every day.  8. Potassium chloride 20 mEq 1 every day.  9. Zelnorm 6 mg 1 every day.  10.      Paroxetine hydrochloride 20 mg 1 every day.  11.      Vioxx 25 mg 1 every day as needed.  12.      Fosinopril 20 mg 1 every day.  SOCIAL HISTORY:  The patient is a widowed homemaker.  She uses snuff and has  for more than 50 years, she does not smoke, she does use decaffeinated  coffee, she does not drink alcohol, she does not abuse drugs, and she is  right-handed.   FAMILY HISTORY:  The patient's father died at 35 from chronic obstructive  pulmonary disease.  Her mother died at 11 and had a pacemaker and diabetes  mellitus.  She has one brother who died at 86 of coronary artery disease and  one daughter has diabetes mellitus.   REVIEW OF SYSTEMS:  CONSTITUTIONAL:  The patient denies fevers, chills, and  sweating.  She has edema in her legs.  Her weight is about the same as usual  and she sleeps well.  EYES:  The patient denies diplopia, blurring,  contacts, glaucoma, or cataracts.  She does wear glasses and she uses  eyedrops for seasonal allergies.  EAR, NOSE, MOUTH, AND THROAT:  The patient  denies deafness, tinnitus, rhinorrhea, sneezing, dysacusia, and sores in her  mouth.  She does have a set of dentures but she does not wear them.  CARDIOVASCULAR:  The patient denies chest pain, palpitations, and  claudication.  She does have difficulty breathing and orthopnea last night.  RESPIRATORY:  The patient coughed last night and produced sputum.  She does  not know what  color it was.  She wheezes sometimes.  She does not smoke but  she does dip snuff.  She snores but denies sleep apnea.  GASTROINTESTINAL:  The patient does have some difficulty swallowing and has frequent  indigestion and heartburn.  She denies nausea and vomiting and diarrhea but  she does have a problem with constipation and uses a laxative.  GENITOURINARY:  The patient denies dysuria, pyuria, hematuria, anuria,  hesitation, and frequency.  She has nocturia 0-1 times.  She denies  incontinence and she is postmenopausal.  MUSCULOSKELETAL:  The patient  complains of painful knees and joints in her arms.  She also says that the  muscles of her arms and legs hurt.  She has frequent fatigue but she is  steady on her feet and denies any recent falls.  SKIN:  The patient denies  rashes or other skin problems.  BREASTS:  The patient denies masses, lumps,  tenderness, or discharge of the breasts.  NEUROLOGIC:  The patient denies  faintness, syncope, numbness, seizures, and signs and symptoms of a stroke.  She is sometimes dizzy and occasionally has headaches.  PSYCHIATRIC:  The  patient denies depression and hallucinations.  She has not had any recent  loss of appetite but she does have some anxiety.  ENDOCRINE:  The patient  denies thyroid disease, excessive thirst, excessive hunger, and excessive  urine volume output but she does have non-insulin dependent diabetes  mellitus.  HEMATOLOGIC:  The patient both bruises and bleeds easily.  LYMPHATIC:  The patient denies adenopathy of the neck, axillae, and groin.  ALLERGIC:  The patient has no known drug allergies but does have seasonal  allergies.  ALL OTHER SYSTEMS:  All other systems are negative.   PHYSICAL EXAMINATION:  VITAL SIGNS:  The patient's temperature is 97.6  degrees Fahrenheit taken orally.  Her pulse is 84.  Her respirations 18.  Her blood pressure 126/59.  Her weight is 159.5 pounds.  She is 5 feet 4 inches tall.  Her SaO2 is 98% on  3-1/2 L of oxygen by nasal cannula.  Her  age is 19.  Her telemetry shows normal sinus rhythm.  GENERAL:  The patient is a well-developed, well-nourished white female in no  acute distress.  PSYCHIATRIC:  The patient is pleasant, cooperative, and responds  appropriately.  PSYCHOSOCIAL:  The patient is attended in the room by several family  members.  HEENT:  The patient is normocephalic, atraumatic.  She wears glasses.  Her  pupils are equal, round, and reactive to light and accommodate and are 2.5  mm in diameter.  Her extraocular movements are intact.  Her mouth is moist.  Her oropharynx is benign.  She does indeed not have her dentures in and I  cannot see any teeth in her mouth.  NECK:  The patient's neck is supple with a midline trachea.  She is without  jugular venous distention, bruit, thyromegaly, or hepatojugular reflux.  CHEST:  The patient's chest is eupneic and clear to auscultation and  percussion.  BREASTS:  The patient's breasts are of normal contour for her age and  weight.  She is without discharge or tenderness.  ADENOPATHY:  The patient is without cervical adenopathy.  CARDIAC:  The patient has a regular rate and rhythm.  S1 and 2 are clearly  heard.  She has a 3/6 murmur which is loudest in the right upper sternal  border.  She is without gallops, rubs, or clicks.  ABDOMEN:  The patient is obese but not distended.  Her abdomen is soft.  It  is increased to percussion throughout.  She is nontender.  GENITOURINARY:  The patient is postmenopausal and nontender over her  bladder.  EXTREMITIES:  The patient moves all extremities x4.  She has bilateral ankle  edema.  Her strength is 4/4 in her upper and 5/5 in her lower extremities.  SKIN:  The patient's skin is warm and dry without jaundice, cyanosis,  pallor, or rashes.  She has a brisk capillary refill.  NEUROLOGIC:  The patient is conscious, alert and oriented to person, place,  time and situation.  Cranial nerves  II-XII are grossly intact.   LABORATORIES AND CHEST X-RAY:  The patient's chest x-ray showed  cardiomegaly, congestive heart failure, cardiogenic pulmonary edema is  suspected.  Her EKG showed sinus tachycardia with left axis deviation and ST-  T wave abnormality, left ventricular hypertrophy by voltage, and an inferior  MI--age undetermined.   Her blood gases drawn at 6:05 a.m. this morning showed a pH of 7.190, a PCO2  of 69, a PO2 of 60, BEECF of -1.8 and a BEB -3.2.  HCO3 was 26.4.  TCO2 was  28.5.  ABG's drawn at 6:55 a.m. showed a pH of 7.350, a PCO2 of 48, a BEECF  of 0.9, a BEB of 0.3, an HCO3 of 26.5, and a TCO2 of 28.  Her BNP was 1030.  Sodium was 143, potassium 4.1, chloride 104, CO2 25, BUN 17, creatinine 0.7,  blood glucose 239.  PT was 10.2, INR was 0.9, PTT was 25.1.  White blood  cell count 20.1, hemoglobin 13, hematocrit 40, platelets 332.  Her MCH was  26.1.  Her MCHC was 32.4.  Her RDW was 15.2.  Neutrophils were low at 37.1%, lymphocytes were high at 49.1%, and eosinophils were high at 5.5%.  [id number]:  Her CPK #1 was 65, her troponin-I was 0.3.  At 0910, her CPK #2 was 65, her  troponin-I was 2.6.  At 1210, her CPK #3 was 92 and her troponin-I was 8.1.   IMPRESSION:  1. Congestive heart failure.  2. Hypertrophic cardiomyopathy.  3. Abnormal cardiac enzymes.  4. Minor coronary artery disease.  5. Non-insulin dependent diabetes mellitus.  6. Hypertension.  7. Hyperlipidemia.  8. Gastroesophageal reflux disorder.  9. Anxiety.   PLAN:  1. Admit to Dr. Mathis Bud.  2. Appropriate home medications.  3. Lovenox by pharmacy protocol.  4. Continue diuresis.     Garnette Czech. Holland Falling. Glade Lloyd, M.D.    LMK/MEDQ  D:  08/14/2002  T:  08/15/2002  Job:  [id number]

## 2010-07-15 NOTE — Discharge Summary (Signed)
NAME:  [name], [name]                      ACCOUNT NO.:  [id number]   MEDICAL RECORD NO.:  [id number]                   PATIENT TYPE:  INP   LOCATION:  2031                                 FACILITY:  [facility]   PHYSICIAN:  Lilia Argue. Servando Snare, M.D.            DATE OF BIRTH:  [birth year]   DATE OF ADMISSION:  08/14/2002  DATE OF DISCHARGE:  08/28/2002                                 DISCHARGE SUMMARY   PRIMARY ADMITTING DIAGNOSES:  1. Congestive heart failure.  2. Acute myocardial infarction.   ADDITIONAL/DISCHARGE DIAGNOSES:  1. Congestive heart failure.  2. Acute myocardial infarction.  3. Aortic stenosis.  4. Hyperlipidemia.  5. Non-insulin-dependent type 2 diabetes mellitus.  6. Iron-deficiency anemia.  7. Hypertension.  8. Gastroesophageal reflux.  9. Fibromyalgia.  10.      Anxiety.  11.      Postoperative atrial fibrillation.  12.      Chronic periodontitis.   PROCEDURES PERFORMED:  1. Cardiac catheterization.  2. Aortic valve replacement with number 21 pericardial tissue valve.  3. Biopsy of myocardium.   HISTORY:  The patient is a 77 year old white female who awoke on the morning  of this admission complaining of dyspnea and orthopnea.  She had had a  similar episode of congestive heart failure, requiring admission,  approximately one year ago; therefore, she called EMS, as her symptoms were  very similar, and was taken to the emergency department at [facility]  where she was started on IV Lasix.  She was stabilized, and cardiac enzymes  from [place] were noted to be abnormal.  She also had ST- and T-wave  abnormalities on her EKG; therefore, she was transferred to [facility] for further cardiac workup.   HOSPITAL COURSE:  She was seen by cardiology, and a transesophageal  echocardiogram was performed; she was found to have marked left ventricular  hypertrophy with questionable amyloid heart disease.  She was also noted to  have moderate to  severe aortic stenosis.  She was started on low-dose  dopamine and aspirin therapy.  She was scheduled for cardiac catheterization  on August 18, 2002; this confirmed critical aortic stenosis and showed normal  coronary arteries.  It was recommended that she proceed with aortic valve  replacement at this time.  She was seen in consultation by Dr. Servando Snare, and  it was agreed that surgical intervention was in her best interest at this  point.   In the course of her preoperative workup, she underwent a dental evaluation  by Dr. Enrique Sack; she was found to have some evidence of chronic  periodontitis, but the patient decided to defer any cleaning or dental  treatment at this point.  Following the completion of a thorough  preoperative workup, she was taken to the operating room on August 22, 2002,  and underwent aortic valve replacement with a number 21 pericardial valve.  She tolerated the procedure well and was transferred to  the SICU in stable  condition.  She was extubated shortly after surgery.   She was hemodynamically stable and doing well on postop day one.  At that  time, she was able to be transferred to the floor.  She was continued on  Lasix for volume overload and CHF.   On postop day two, she developed atrial fibrillation with rates to the 140s.  She was symptomatic and was started on the digoxin protocol.  She was later  seen by cardiology, and sotalol was added to her regimen.  Her blood sugars  were also noted to be somewhat elevated.  The [facility] hospitalist group was  consulted to help manage her sugars; it was recommended that she continue on  sliding-scale insulin, and Amaryl was added until she could restart her home  dose of Glucophage.  Her rhythm converted from atrial fibrillation to atrial  flutter, and she has rapid A paced into sinus rhythm.  She was also started  on a DDD pacer at a rate of 80.  Her underlying rhythm was sinus, and the  pacer was ultimately weaned and  discontinued.  She has continued to have  intermittent, brief episodes of atrial fibrillation followed by two beats of  bradycardia; however, for the most part, her rhythm has remained sinus with  a rate of 70 to 80.  She has been started on Coumadin for anticoagulation  for her aortic valve.  At the present, her PT is 18.2, and INR is 1.6.  She  has otherwise done well.  She has remained afebrile, and other vital signs  have been stable.  She has been ambulating in the halls without difficulty  with cardiac rehab phase 1 as well as independently with her family.  She  has been tolerating a regular diet and is having normal bowel and bladder  function.  Her blood sugars have remained stable, and they are running  anywhere from 100 to 130.  Her other labs have been stable; she is mildly  anemic with a hemoglobin of 9.3 and hematocrit of 28.7.  White blood count  is stable at 10.8 with platelets of 294.  Her surgical incision sites are  healing well, and it is felt by all parties involved in her care that she is  ready for discharge home at this time to follow up with the [facility] later this week for management of her anticoagulation.   DISCHARGE MEDICATIONS:  1. Coumadin 5 mg daily until her blood work is checked.  2. Lanoxin 0.125 mg daily.  3. Betapace 40 mg b.i.d.  4. Lasix 40 mg daily.  5. Potassium chloride 20 mEq daily.  6. Paroxetine 20 mg daily.  7. Zelnorm 6 mg daily.  8. Alprazolam 0.5 mg q.h.s.  9. Metformin 1000 mg b.i.d.  10.      Zocor 40 mg q.h.s.  11.      Ultram 50 to 100 mg q.4h. p.r.n. for pain.   DISCHARGE INSTRUCTIONS   ACTIVITY:  1. She is to refrain from driving, heavy lifting, or strenuous activity.  2. She may continue daily walking and use of her incentive spirometer.   DIET:  She was asked to continue a low-fat, low-sodium, diabetic diet.  WOUND CARE:  She may shower daily and clean her incisions with soap and  water.     DISCHARGE FOLLOW UP:  1. She will be scheduled to follow up with [facility]     later  this week.  She will actually have her blood work drawn at [facility], which is more convenient for her, and the blood work will be     called to Dr. Kennon Holter office for her Coumadin management.  2. She was asked to make an appointment to see Dr. Gwenlyn Found in followup in two     weeks.  3. She will then see Dr. Servando Snare on Thursday, September 18, 2002, at 11:50 a.m.     She will have a chest x-ray at [facility] one hour     prior to this appointment and should take her films to Dr. Everrett Coombe     office for his review.  4. She was also asked to follow up with her primary care physician for     reevaluation of her blood sugars in the next one to two weeks.   SPECIAL INSTRUCTIONS:  She should call our office if she experiences any  problems or has questions in the interim.     Suzzanne Cloud, P.A.                        Lilia Argue Servando Snare, M.D.    GC/MEDQ  D:  08/28/2002  T:  08/28/2002  Job:  [id number]   cc:   Quay Burow, M.D.  [address]  [place]  [place] [address]  Fax: [phone]   [facility]    cc:   Quay Burow, M.D.  [phone] [address], [place] [address]  Fax: [phone]   [facility]

## 2010-07-15 NOTE — Consult Note (Signed)
[name], [name]                      ACCOUNT NO.:  [id number]   MEDICAL RECORD NO.:  [id number]                   PATIENT TYPE:  INP   LOCATION:  2028                                 FACILITY:  [facility]   PHYSICIAN:  Lenn Cal, D.D.S.          DATE OF BIRTH:  [birth year]   DATE OF CONSULTATION:  08/19/2002  DATE OF DISCHARGE:                                   CONSULTATION   REQUESTING PHYSICIAN:  Lilia Argue. Servando Snare, M.D.   DENTAL CONSULTATION:  [name] is a 77 year old female referred by  Dr. Lanelle Bal for dental consultation. The patient was admitted with  acute MI with significant congestive heart failure. The patient later found  to have critical aortic stenosis and the need for aortic valve replacement,  heart surgery with Dr. Servando Snare. A dental consultation was requested to rule  out dental infection which may affect the patient's systemic health and  anticipated heart valve surgery.   MEDICAL HISTORY:  1. Critical aortic stenosis. Anticipated aortic valve replacement, heart     surgery with Dr. Servando Snare in the future.  2. Congestive heart failure.  3. Coronary artery disease.  4. Diabetes mellitus, type 2.  5. Hyperlipidemia.  6. Iron-deficiency anemia.  7. Hypertension.  8. History of GERD.  9. Anxiety/depressive disorder.  10.      History of seasonal allergies.  11.      Status post uterine fibroid removal.   ALLERGIES/ADVERSE DRUG REACTIONS:  None known.   MEDICATIONS:  1. Paxil 20 mg daily.  2. Protonix 40 mg twice daily.  3. Glucophage 500 mg twice daily.  4. Lasix 40 mg daily.  5. Toprol-XL 12.5 mg daily.  6. Zocor 40 mg at bedtime.  7. Patanol 0.1% one drop in both eyes twice daily.  8. Humulin R per sliding scale.  9. Albuterol inhaler two puffs every six hours.  10.      Zestril 20 mg daily.  11.      Potassium chloride 40 mEq daily.  12.      Chlorhexidine rinses twice daily.   SOCIAL HISTORY:  The patient is a  nonsmoker. The patient has dipped snuff in  the left vestibule for the past 50 years. The patient currently denies use  of alcohol. The patient is a widow and lives with her daughter.   FAMILY HISTORY:  Father died at age of 33 with complications from COPD.  Mother died at the age of 54 with a history of diabetes mellitus and  pacemaker placement.   FUNCTIONAL ASSESSMENT:  The patient was independent for ADLs prior to this  admission.   REVIEW OF SYSTEMS:  This is reviewed from the chart and health history  assessment form from this admission.   CHIEF COMPLAINT:  The patient needs a pre-heart valve surgery dental  consultation and examination.   HISTORY OF PRESENT ILLNESS:  Patient with identification of critical aortic  stenosis. The patient with  anticipated aortic valve replacement heart  surgery with Dr. Servando Snare. The patient is now seen as part of a pre-aortic  valve replacement heart surgery dental protocol to rule out dental infection  which may affect the patient's systemic health and anticipated heart valve  surgery.   The patient currently denies acute toothaches, swellings, or abscesses. The  patient last saw a dentist approximately three years ago when an upper  complete denture was made. The patient indicates that these fit okay, but  the patient does not have the upper complete and lower partial dentures with  her. The patient indicates that it has been a while since she has had her  teeth cleaned.   DENTAL EXAMINATION:  GENERAL:  The patient is a well-developed, well-  nourished female in no acute distress.  VITAL SIGNS:  Blood pressure is 98/45, pulse 65, temperature 98.0,  respirations 20.  HEAD AND NECK EXAM:  There is no palpable lymphadenopathy. There are no  acute TMJ symptoms.  INTRAORAL EXAM:  The patient has normal saliva. There is some leukoplakia  associated with the area #21-22 consistent with previous use of snuff over  the past 50 years. This has  caused significant staining to the teeth in this  area.  DENTITION:  The patient with multiple missing teeth with tooth numbers 21  through 29 are the teeth remaining.  PERIODONTAL:  Patient with chronic periodontitis with plaque accumulations,  generalized gingival recession, incipient tooth mobility, and moderate  horizontal vertical bone loss.  DENTAL CARIES:  There are no obvious dental caries noted. There is evidence  of erosion/attrition.  ENDODONTIC:  The patient currently denies acute pulpitis symptoms. There are  no obvious periapical radiolucencies noted at this time.  CROWN OR BRIDGE:  There are no crown or bridge restorations noted.  PROSTHODONTIC:  Patient with upper complete and lower partial denture. The  patient does not have the dentures with her at this time. I am unable to  assess them at this time. The patient indicates that they do fit okay.  OCCLUSION:  Unable to assess fully without the dental prostheses.   RADIOGRAPHIC INTERPRETATION:  A panoramic x-ray was taken on 08/19/02.   There are multiple missing teeth. There is moderate horizontal vertical bone  loss. There is evidence of possible erosion or attrition of the remaining  teeth. There are no obvious periapical radiolucencies.   ASSESSMENT:  1. Plaque accumulations.  2. Staining of the mandibular left teeth consistent with previous snuff     dipping.  3. Chronic periodontitis with bone loss.  4. Generalized gingival recession with significant gingival defect     associated with tooth numbers 21 and 22.  5. Incipient tooth mobility.  6. No obvious periapical radiolucencies or acute pulpitis symptoms.  7. Multiple missing teeth.  8. Multiple diastemas.  9. Erosion/attrition of the remaining mandibular teeth.  10.      Need for antibiotic premedication prior to invasive dental     procedure.   PLAN/RECOMMENDATIONS: 1. I have discussed the risks, benefits, and complications of various     treatment  options with the patient in relationship to her medical and     dental conditions, anticipated heart valve surgery, and risk for a     subacute bacterial endocarditis. We discussed various treatment options     to include no treatment, periodontal therapy with antibiotic     premedication, dental restorations, evaluation of dental prostheses with     evaluation for replacement if indicated,  and other treatment as     indicated. The patient currently wishes to defer all treatment at this     time. The patient will contact dental medicine if she changes her mind     and wishes to have a dental cleaning at the [facility] at     this time. The patient would ideally benefit from having her teeth     cleaned at this time.  2. Discussion of findings with Dr. Ceasar Mons in light of the above     discussion.  3. Suggest use of antibiotic premedication prior to invasive dental     procedures for future dental appointments as indicated.  4. Suggest continuation of the chlorhexidine rinses twice daily at this     time.                                               Lenn Cal, D.D.S.    RFK/MEDQ  D:  08/19/2002  T:  08/20/2002  Job:  [id number]   cc:   Lilia Argue. Servando Snare, M.D.  [address]  [place]  [place] [address]  Fax: [phone]   Quay Burow, M.D.  [phone] [address] [place]  [place] [address]  Fax: [phone]

## 2010-07-15 NOTE — H&P (Signed)
NAMESHERLE, [name] NO.:  [id number]   MEDICAL RECORD NO.:  [id number]          PATIENT TYPE:  INP   LOCATION:  0101                         FACILITY:  [facility]   PHYSICIAN:  Kathrin Penner, M.D.   DATE OF BIRTH:  [birth year]   DATE OF ADMISSION:  02/04/2004  DATE OF DISCHARGE:                                HISTORY & PHYSICAL   PROBLEM:  This 77 year old lady is admitted with persistent abdominal pain  associated with nausea and vomiting.  She was seen recently in the emergency  room for these symptoms and diagnosis of cholelithiasis was made by  abdominal ultrasound.  The ultrasound did not show any changes of acute  cholecystitis.  She has continued to have right upper quadrant pain,  worsening some, but without any additional nausea and vomiting.  The patient  does not have any chills, fever, or jaundice.   PAST SURGICAL HISTORY:  1.  Aortic valve replacement in June 2004.  She had been on anticoagulant      medications, but she has been off these for the past couple of weeks in      preparation for an endoscopy with esophageal dilatation and colonoscopy.  2.  Permanent transvenous pacemaker.  3.  Tubal ligation.   CURRENT MEDICATIONS:  1.  For gastroesophageal reflux disease, Omeprazole.  2.  For hypertension and hypertensive cardiovascular disease, she takes      Altace, Zocor, metoprolol, potassium chloride, and low dose aspirin.  3.  For her diabetes mellitus, she takes Metformin.  She also takes iron      pill.   ALLERGIES:  No known drug allergies.   SOCIAL HISTORY:  She is a widowed white female.  She is a gravida 5, para 5,  living [place].  She lives at home with her daughter in [place].  She does  not smoke cigarettes, but apparently dips snuff.  She does not use alcohol.   REVIEW OF SYSTEMS:  HEENT:  Positive for sinusitis.  CARDIOPULMONARY:  As  noted above.  GASTROINTESTINAL:  Negative except as noted above.  The  patient has had a history  of polyps removed in the past.  ENDOCRINE:  Diabetes mellitus type 2 as noted above.  MUSCULOSKELETAL:  The patient has  osteoarthritis.  She is not on any medications for this currently.   PHYSICAL EXAMINATION:  GENERAL:  She is an alert, well-developed, well-  nourished, white female.  HEENT:  No thyromegaly, no carotid bruits, no cervical nodes.  LUNGS:  Clear to auscultation bilaterally.  HEART:  Regular rate and rhythm.  There is a 2/6 murmur at the base.  ABDOMEN:  Exquisitely tender in the right upper quadrant with positive  rebound.  There are no palpable masses and no visceromegaly.  Bowel sounds  are active.  EXTREMITIES:  No cyanosis, clubbing, or edema.  NEUROLOGIC:  She is alert and oriented, moving all four extremities.   LABORATORY DATA:  CBC shows a white blood cell count of 18.4, hemoglobin  12.9, hematocrit 39, platelet count 284.  Her liver function tests are all  within normal limits.  Basic  metabolic panel:  Sodium Q000111Q, potassium 3.7,  chloride 98, CO2 25, BUN is elevated at 34, and creatinine is 1.8.  Her  glucose is 129.  PT and PTT are within normal limits with an INR of 0.8.   ASSESSMENT:  1.  Acute cholecystitis.  2.  History of valvular heart disease, status post aortic valve replacement,      currently not on anticoagulation.  3.  Diabetes mellitus type 2, non-insulin dependent.   PLAN:  Laparoscopic cholecystectomy with intraoperative cholangiogram.  I  discussed these plans with her cardiologist, Dr. Rollene Fare.  He feels it is  safe to proceed with her cholecystectomy at this time, and he will pick up  and continue to follow her along with [place].     Patr   PB/MEDQ  D:  02/04/2004  T:  02/04/2004  Job:  [id number]   cc:   Delfino Lovett A. Rollene Fare, M.D.  [phone] [address], [place] [address]  Fax: [phone]

## 2010-07-15 NOTE — Discharge Summary (Signed)
NAME:  [name], [name]                      ACCOUNT NO.:  [id number]   MEDICAL RECORD NO.:  [id number]                   PATIENT TYPE:  INP   LOCATION:  2031                                 FACILITY:  [facility]   PHYSICIAN:  Sallyanne Havers, M.D.                 DATE OF BIRTH:  Jun 03, 1929   DATE OF ADMISSION:  12/30/2001  DATE OF DISCHARGE:  01/01/2002                                 DISCHARGE SUMMARY   DISCHARGE DIAGNOSES:  1. Congestive heart failure new onset.  2. Left ventricular hypertrophy with diastolic dysfunction.  3. Hyperlipidemia.  4. Diabetes mellitus type 2.  5. Iron deficiency anemia.  6. Hypertension.  7. Gastroesophageal reflux disease.  8. Fibromyalgia.  9. Anxiety.   PROCEDURES:  None.   CONSULTATIONS:  Cardiology, Dr. Rollene Fare.   DISCHARGE MEDICATIONS:  1. Aspirin 325 mg q.d.  2. Monopril 10 mg q.d.  3. Xanax 0.5 mg b.i.d. p.r.n. anxiety/insomnia.  4. Toprol XL 12.5 mg q.d.  5. Lasix 40 mg q.d.  6. Prilosec 20 mg q.d.  7. Metformin 500 mg q.d.  8. Glyburide 2.5 mg q.d.  9. Zetia 10 mg q.d.  10.      Ferrous Sulfate 325 mg q.d.   SPECIAL INSTRUCTIONS:  The patient is instructed to record her weights daily  and bring to her next office visit.  Also instructed to limit sodium to less  than 2 grams per day.   DISPOSITION AND FOLLOW UP:  The patient is stable on day of discharge and  instructed to call the [facility] to make a hospital follow up  appointment with Dr. Mateo Flow as soon as possible and also to fo in two  weeks with Dr. Rollene Fare and the phone numbers were given.   BRIEF ADMISSION HISTORY:  This 77 year old, white female with history of  aortic stenosis, who had presented to the [facility] Emergency Department  secondary to worsening shortness of breath that began on day of admission.  One week prior to admission, she had seen Dr. Rollene Fare and she was given a  beta blocker at that time.  She did not know the dose of the  Toprol that she  had been taking.  The shortness of breath started that morning only with  exertion, but became a lot worse that evening.  She did not have any cough  with it and no fevers and denied chest pain at that time.   PERTINENT LABORATORY DATA:  White blood count 20.9, hemoglobin 11.4,  platelets 354.  EKG showed sinus tachycardia with peak T waves in 2, 3, AVF,  and elevated ST in V3 and V4.  No prior studies to compare this to.  A  complete metabolic panel was within normal limits and total CK was 144.  CK-  MB 2.8, troponin I 0.02 with a relative index of 1.9.   HOSPITAL COURSE:  1. Cardiovascular- The patient was admitted for  new onset congestive heart     failure and this was thought to be secondary to beginning of beta blocker     one week prior to symptoms.  A chest x-ray revealed congestive heart     failure with interstitial and alveolar pulmonary edema, and a few hours     later a repeat chest x-ray showed cardiomegaly and abnormal lung density     at the bases related to congestive heart failure, pneumonia not excluded.     The patient had questionable changes on EKG, but no study to compare it     to.  Cardiology was consulted and three more sets of enzymes were     obtained.  The second set of enzymes were slightly elevated with a total     CK of 162, CK-MB 5.6, troponin I 0.29 with a relative index of 3.5.  The     third set showed a total CK of 236, CK-MB 6.1, troponin I 0.22 with a     relative index of 2.6.  A fourth set was obtained with total CK 244, CK-     MB 4.4, troponin I 0.13 and relative index of 1.8.  The patient was     started on oxygen, a beta blocker and ACE inhibitor, as well as a     Nitroglycerin drip while MI was ruled out.  Cardiology saw the patient     and thought that the elevated enzymes were most likely secondary to the     congestive heart failure and recommended no further workup for acute     myocardial infarction.  The patient was  placed on telemetry and no     further EKG changes throughout hospitalization.  The patient was started     on Lasix 60 mg times one in the emergency room, then 40 mg b.i.d. IV     times one day.  On day prior to discharge, she was changed to Lasix 40 mg     q.d. and discharged on that dose.  On day of discharge, lungs were clear     to auscultation with no crackles and patient had oxygen saturation at 98%     on room air and 93% to 94% on room air after ambulation.  She has no more     complaints of shortness of breath.  Diagnosis for this patient was     questioned as she had been diagnosed with aortic stenosis by an echo done     on 10/2000, which did reveal moderate to severe, mild to moderate aortic     stenosis.  An echocardiogram done on 08/07/2001, revealed severe     concentric left ventricular hypertrophy with an EF of 60% and a good     opening of the aortic valve with mild calcification, but not compatible     with significant aortic stenosis.  Diastolic relaxation abnormality was     noted.  Cardiology did recommend possible Verapamil at 120 mg q.d. for     this patient and also a stress test versus a TEE to further assess heart     abnormalities in this patient.  2. Hyperlipidemia.  A lipid panel was obtained in this patient and her total     cholesterol was 237, triglycerides 149, HDL 62, and LDL 145.  The patient     has been taking Zetia 10 mg q.d. and there was a questionable intolerance     to statins in the patient.  As patient's goal is LDL less than 100, I     would recommend trying either Niacin or a fimbriatus not already used in     this patient or to reassess her intolerance for a statin.  3. Diabetes mellitus type 2.  This patient was being diuresed upon     admission.  Her home medications were held and she was started on sliding     scale Insulin.  Her CBGs remained stable and on day prior to discharge,    she was switched back to her home regimen of Glyburide  and Glucophage.  4. Hypertension.  Blood pressures remain stable and at a goal of less than     130/80 and her Toprol XL was decreased to 12.5 mg q.d. and her Monopril     10 mg q.d. was continued.  The patient may be a candidate for Verapamil     with her cardiac history and this can be done as an outpatient.  5.     Iron deficiency anemia.  The patient's hemoglobin and hematocrit on     admission were 11.4 and 35.4 respectively.  On day of discharge, the     patient was recommended to start Ferrous Sulfate 325 mg q.d. and     recommended to repeat CBC as an outpatient.                                                  Sallyanne Havers, M.D.    AS/MEDQ  D:  01/01/2002  T:  01/02/2002  Job:  [id number]   cc:   Mamie Laurel, M.D.  [facility]  [place]  [place] [address]  Fax: [phone]   [place]. Rollene Fare, M.D.  [phone] [address]. [address] [place] [address]  Fax: [phone]

## 2010-07-15 NOTE — H&P (Signed)
[facility]  Patient:    [name], [name] Visit Number: [id number] MRN: [id number]          Service Type: DSU Location: [facility] Attending Physician:  Jerald Kief Dictated by:   Jerald Kief, M.D. Admit Date:  11/05/2000 Discharge Date: 11/05/2000                           History and Physical  HISTORY OF PRESENT ILLNESS:  Ms. Densley is a 77 year old, G5, P5, A1, who presented to me in August of this year for abnormal uterine bleeding, recently having stopped Prempro.  She did have a sonohistogram which showed an endometrial polyp.  We have recommended hysteroscopy D&C for evaluation of this; however, upon presentation for surgical evaluation, she had abnormal EKG.  She was followed up by cardiologist and underwent full evaluation including heart catheterization and approved her for surgery today.  PAST MEDICAL HISTORY:         Significant for hypertension, EKG changes with normal cardiovascular workup, reflux, and history of anemia.  Also significant for diabetes.  PAST SURGICAL HISTORY:        Negative.  MEDICATIONS:                  Procardia, Pravachol, aspirin, Claritin, Flonase, Monopril, multivitamins, Paxil, Xanax  ALLERGIES:                    No known drug allergies.  PHYSICAL EXAMINATION:  VITAL SIGNS:                  Blood pressure 158/82.  HEART:                        Regular rate and rhythm.  LUNGS:                        Clear to auscultation bilaterally.  ABDOMEN:                      Nondistended, nontender.  PELVIC:                       Uterus anteverted, mobile, and nontender.  No adnexal masses are palpable.  IMPRESSION AND PLAN:          Abnormal uterine bleeding with sonohistogram showing endometrial polyp.  Hysteroscopy dilatation and curettage providing treatment of this and endometrial mass.  Risks and benefits were discussed at length which include but are not limited to risks of infection,  bleeding, damage to uterus, tubes, ovaries bilaterally, and bladder.  She does give her informed consent. Dictated by:   Jerald Kief, M.D. Attending Physician:  Jerald Kief DD:  03/22/01 TD:  03/22/01 Job: [id number]

## 2010-07-15 NOTE — Discharge Summary (Signed)
NAMEBRADLEE, [name]            ACCOUNT NO.:  [id number]   MEDICAL RECORD NO.:  [id number]          PATIENT TYPE:  INP   LOCATION:  [id number]                         FACILITY:  [facility]   PHYSICIAN:  Kasandra Knudsen, M.D.   DATE OF BIRTH:  1929-12-02   DATE OF ADMISSION:  07/13/2006  DATE OF DISCHARGE:  07/19/2006                               DISCHARGE SUMMARY   ADMITTING DIAGNOSES:  1. Fever.  2. Congestive heart failure.  3. Hyperlipidemia.  4. Diabetes.  5. Depression.  6. Status post aortic valve replacement.   DISCHARGE DIAGNOSES:  1. Group B Streptococcal bacteremia.  2. Status post aortic valve replacement.  3. History of atrial fibrillation.  4. Hyperlipidemia.  5. Anemia.  6. Diabetes.  7. Depression.  8. Congestive heart failure.   DISCHARGE MEDICATIONS:  1. Rocephin 2 grams IV q.24 hours x15 additional days to be dosed with      home health.  2. Coumadin 5 mg p.o. daily.  3. Amitriptyline/benazepril 10/20 p.o. daily.  4. Iron sulfate 325 p.o. b.i.d.  5. Fluoxetine 20 mg p.o. daily.  6. Lasix 20 mg p.o. daily.  7. Metformin 1000 mg p.o. b.i.d.  8. Xanax 0.5 mg p.o. b.i.d. p.r.n. anxiety.  9. Vicodin 5/500 one tablet p.o. t.i.d. p.r.n. pain.  10.Digoxin 0.125 mg p.o. daily.  11.Protonix 40 mg 1 tablet p.o. daily.  12.Lovenox 70 mg q.12 hours until therapeutic INR.  This will also be      dosed by home health.  13.Klor-Con 20 mg 2 tablets p.o. daily.  14.Metoprolol 50 mg p.o. b.i.d.   HOSPITAL COURSE:  This is a 77 year old female who was admitted for  fever and was found to have group B strep bacteremia during this  hospital stay.  She did well during this hospital course, and eventually  remained afebrile.  She will need a prolonged course of IV antibiotics  to cure this GBS bacteremia.  Please see the following for details:   1. Fever:  On admission, it was unknown as the source of this fever,      as her urinalysis was within normal limits and her chest  x-ray did      not show a pneumonia.  We did get blood cultures, which did      eventually grow out a group B strep bacteremia.  We initially      started Rocephin and gentamicin, as we were concerned for an      endocarditis given the patient's aortic valve replacement.      However, a TEE, or transesophageal echo done during this hospital      stay by cardiology did not show any meditation or growth;      therefore, we ruled out endocarditis.  We consulted infectious      disease through this hospital course, who recommended a prolonged      course of Rocephin for a total of 21 days.  Another blood culture      will need to be taken 2 to 3 weeks after the last dose of Rocephin      is given.  The  patient remained afebrile throughout this hospital      course.  However, she noted to have a fever in the emergency room.      Again, we initially started gentamicin and Rocephin; however, we      switched over to Rocephin only once ID recommended this.  Please      note that the patient was on a short-course of vancomycin prior to      the culture being final.  The patient's white blood count was      elevated during this hospital stay, which goes along with      bacteremia type picture; however, remained in the 11 and 12s and      did not go higher.  Home health was consulted and will help this      patient with a Rocephin course, as well as her Lovenox.  The      patient will need to follow up with her primary care [name],      which is both Dr. Sharlyne Pacas family practice, as well as Dr. Tamala Julian in      [facility].  The patient desires to return to Dr.      Tamala Julian for followup of this infection.   1. Status post aortic valve replacement:  The patient had      bioprosthetic aortic valve placed in 2004.  She had no problems      with this valve.  However, given a fever, elevated white count, and      a positive blood culture for GBS, we were very concerned about an       endocarditis.  Her physical exam did not show any indications of      endocarditis, including __________; however, __________  this      patient would have so.  We therefore started IV antibiotics, which      would cover an endocarditis.  We also consulted her cardiologist at      [facility] to have them assess the patient.  They did      a transesophageal echo, which showed no vegetation, as well as a      left ventricular systolic function that was normal, a normal      bioprosthetic aortic valve, mild pulmonary hypertension, normal      right ventricle.  Cardiology continued to follow because the      patient also has a history of a-fib and has a pacemaker placed.      They recommended bridging the patient with Lovenox until her INR      was therapeutic.  The patient will follow up with [facility]      [facility] as needed or as previously scheduled.  Otherwise, they      also recommended a dental consult in the future, as the patient may      need her teeth extracted to prevent any other infections.  The      patient will follow up with a primary care [name] to discuss      this.   1. History of a-fib:  It was noted on this hospital stay the patient      has a history of a-fib; however, has not had atrial fibrillation      since her pacemaker was placed.  She will need to be continued on      her Coumadin.  The cardiologist recommended bridging her with      Lovenox until she had a therapeutic  INR.  At day of discharge, her      INR was 1.3.  Home health was on board to help this patient with      her Lovenox, and she will continue with Lovenox and Coumadin until      her INR is therapeutic.  She is supposed to check an INR on Monday,      May 27.   1. CHF:  We resumed the patient's home regimen of medications, which      included digoxin 0.125 mg p.o. daily, Lasix 20 mg p.o. daily, K-Dur      40 mEq p.o. daily, metoprolol 50 mg p.o. b.i.d.  The patient did      well during this hospital stay as we continued home meds and      discharged her on her home meds as well.  The TEE performed did not      give a number or ejection fraction; however, the comment was left      ventricular systolic function normal.  The patient may follow up      with a cardiologist to address this.   1. Hyperlipidemia:  The patient's primary cardiologist had stopped her      Statin because of low extremity pain.  We did not resume the Statin      at initial intake because she still had occasional low extremity      pain.  The pain did subside since the Statin had been stopped.  The      primary care physician, either Dr. Sharlyne Pacas at [facility] or Dr. Tamala Julian at [facility] need to consider restarting this      Statin.  A fasting lipid panel was not checked during this      hospital; however, may be checked by a primary care physician.   1. Anemia:  This patient has a known history of microcytic anemia.  It      was noted again that her hemoglobin was low.  Initially it was      10.1; however, during this hospital stay, it got as low as 8.4      where it remained during the hospital stay, and on day of discharge      it was 8.6.  We strongly encouraged the patient to get a      colonoscopy during this hospital stay, as given her risk factors of      smoking, as well as microcytic anemia, we will very concerned of a      colon cancer.  Also, the patient has group B strep bacteremia also      concerning.  The patient declined a colonoscopy during this      hospital stay, as well as a barium enema radiological study.  She      understands the risks of not having these procedures may miss a      cancer.  She understands this risk and declines at this point;      however, if she changes her mind, she will let Dr. Sharlyne Pacas or Dr.      Tamala Julian know.  The patient did not require blood transfusion during      this hospital stay.  Her iron studies were done, which  showed iron      low at 15, iron binding capacity normal at 291, percent saturation      low at 5.  Ferritin was normal at 139.  We continued the patient's      iron 325 p.o. b.i.d.  Again, the patient's primary care [name]      needs to continue to strongly recommend a colonoscopy to further      workup this anemia.   1. Diabetes:  Initially, we held the patient's metoprolol, as we were      unsure of what procedures the patient may need, and also the      patient was n.p.o. briefly for the transesophageal echo.  Once the      patient was tolerating a p.o. diet, we resumed her home dose of      metformin 1000 mg p.o. b.i.d., and the patient's blood sugars      remained slightly elevated; however, not enough to change any of      her medications.  Her primary care [name] can add any medications      they feel necessary.   1. Depression/anxiety:  We continued the patient's fluoxetine.  No      other changes were made at this time.  CONSULTATIONS:  Cardiology, infectious disease.   PROCEDURES AND/OR IMAGES:  1. Transesophageal echo as previously dictated.  2. CT of the head without contrast, which shows no acute      abnormalities, no intracranial hemorrhage.  3. Abdominal ultrasound, which showed a previous cholecystectomy, no      biliary ductal diltation.  There was mild diffused fatty      infiltrates in liver, and a 2 cm left renal cyst.  No other      problems.  4. Chest x-rays showed no edema or air space problems.  5. Renal ultrasound, she had a small benign appearing left renal cyst,      but otherwise was unremarkable.   PERTINENT LABS:  As previously dictated, blood cultures showed group B  strep bacteremia.  Basic metabolic panel on admission showed low sodium  132, potassium was normal at 4.5, chloride normal 99, CO2 normal at 23,  glucose elevated at 148, BUN 16, creatinine 0.93.  On day prior to  discharge, basic metabolic panel was within normal limits except  for  glucose elevated at 132 and sodium was back to normal at 136.  INR on  day of admission was 1.5, which is subtherapeutic.  On day of discharge,  it was 1.3.  Please note, we held the Coumadin for a couple of days  given the patient's transesophageal echo procedure.  Troponin remained  within normal limits at 0.04.  Digoxin level was normal at 0.06.  BNP  was mildly elevated 339.  The patient's TSH was within normal limits at  0.597.  Liver hepatic function normal.  Bilirubin at 0.6.  Normal AST at  35.  Normal ALT of 20.  Albumin low at 2.4.  As previously dictated, the  iron studies dictated with a iron that was low of 15, iron binding  capacity normal at 291, percent saturation low at 5%.  Magnesium 1.8,  normal.  CBC on admission showed an elevated white count of 13.5,  hemoglobin 10.1, hematocrit 30.9, platelets 309.  By day of discharge,  white blood cells are still mildly elevated at 12, hemoglobin was 8.6,  which had remained steady for the past 3 days, hematocrit 26.5, and  platelets 322.   IMPORTANT FOLLOWUP:  The patient will remain on Rocephin for a total of  15 more days after discharge.  The patient's physician, Dr. Tamala Julian at  [facility], needs to check  a blood culture 2 to 3 weeks after the last  dose of Rocephin.  The patient's states she wanted to go to Dr. Tamala Julian  for followup, and therefore it is important that Dr. Tamala Julian gets a copy  of this dictation.  The patient will followup with [facility] as needed.  The patient will get an INR checked on Monday,  May 27, and then any further checks as Dr. Tamala Julian deems necessary.  Dr.  Tamala Julian  had been following the patient's INR previously; this is per the  patient.  The patient should followup next week.  I was told it was a  walk-in clinic, and the patient does not need an appointment.   Other followup besides the antibiotics is whether the patient should  resume her Statin or not.           ______________________________  Kasandra Knudsen, M.D.     JT/MEDQ  D:  07/23/2006  T:  07/23/2006  Job:  [id number]   cc:   Acquanetta Sit, M.D.  Kaylyn Layer, M.D.  Dr. Tamala Julian

## 2010-07-15 NOTE — Cardiovascular Report (Signed)
NAME:  [name], [name]                      ACCOUNT NO.:  [id number]   MEDICAL RECORD NO.:  [id number]                   PATIENT TYPE:  INP   LOCATION:  2105                                 FACILITY:  [facility]   PHYSICIAN:  Quay Burow, M.D.                DATE OF BIRTH:  1929/07/05   DATE OF PROCEDURE:  08/18/2002  DATE OF DISCHARGE:                              CARDIAC CATHETERIZATION   PROCEDURE PERFORMED:  Right and left heart cardiac catheterization.   HISTORY:  [name] is a 77 year old white female admitted on the 17th with  congestive heart failure.  There is a history of AS versus IHSF.  She had  positive troponins and inverted anterior lateral T-waves.  She underwent  transesophageal echo revealing tight AS with concentric LVH and normal LV  function.  She presents now for right and left heart cath to define her  anatomy.   PROCEDURE DESCRIPTION:  The patient was brought to the second floor [facility] cardiac catheterization lab in the postabsorptive state.  She was  premedicated with p.o. Valium and IV Versed.  Her right groin was prepped  and shaved in the usual sterile fashion.  1% Xylocaine was used for local  anesthesia.  A 7 French sheath was inserted into the right femoral artery  using standard Seldinger technique.  An 8 French sheath was inserted into  the right femoral vein.  A 7 French balloon tipped thermodilution Swan Ganz  catheter was used for obtaining sequential right heart pressures and  __________  thermodilution cardiac output.  Omnipaque dye was used for the  entirety of the case.  Right and left Judkins catheter as well as pigtail  catheter were used for selective coronary angiography, left  ventriculography, supravaginal aortography and distal abdominal aortography.   HEMODYNAMICS:  1. RA pressure is __________  .  RV pressure of 40 systolic.  PA pressure of     20/10, mean 17.  Pulmonary capillary wedge pressure 14/11, mean 9.  2. Cardiac  output was 4.5 Lpm by Fick and  5.9 liters per minute by     thermodilution.  The peak aortic valve gradient was 69 mmHg.  The aortic     valve area calculated to 0.46 sq mm.  3. Aortic systolic pressure [id number].  Diastolic pressure 65.  4. Left ventricular systolic pressure [id number].  End-diastolic pressure 5.   SELECTIVE CORONARY ANGIOGRAPHY:  1. Left main:  Normal.  2. LAD:  The LAD had a 40% segmental proximal stenosis.  3. Circumflex:  Normal.  4. Right coronary artery:  Dominant and normal.   LEFT VENTRICULOGRAPHY:  This was performed using hand injection with a  syringe of approximately 10 mL of dye.  The LV function appeared to be  normal.   SUPRAVALVULAR AORTOGRAPHY:  Supravalvular aortogram was performed in the RAO  using 20 mL of Omnipaque dye, 20 mL per second.  There was no  aortic  insufficiency noted.  The aortic root did not appear to be significantly  dilated.   DISTAL ABDOMINAL AORTOGRAPHY:  Distal abdominal aortogram was performed  using 20 mL of Omnipaque dye, 20 mL per second.  The renal arteries were  widely patent.  The inferior abdominal aorta and iliac bifurcation appeared  free of atherosclerotic changes.   IMPRESSION:  [name] has essentially normal coronary arteries and  critical aortic stenosis with normal left ventricular function.  She is  presented with congestive heart failure.  I believe she is a good candidate  for aortic valve replacement.   An ACT was measured and both sheaths were removed.  Pressure was held to  achieve hemostasis.  The patient left the lab in stable condition. She will  be returned the medical intensive care unit.  Dr. Lilia Argue. Servando Snare was  notified of these results and will be the  consulting cardiovascular  surgeon.                                                Quay Burow, M.D.    JB/MEDQ  D:  08/18/2002  T:  08/19/2002  Job:  [id number]  [facility]   cc:   [facility]

## 2010-07-15 NOTE — Cardiovascular Report (Signed)
[place]  Patient:    TANELL, WITTSTRUCK Visit Number: [id number] MRN: [id number]          Service Type: CAT Location: [facility] 2899 12 Attending Physician:  Birdie Riddle Dictated by:   Birdie Riddle, M.D. Proc. Date: 11/14/00 Admit Date:  11/14/2000 Discharge Date: 11/14/2000                          Cardiac Catheterization  PROCEDURES: Left heart catheterization, selective coronary angiography and left ventricular function study, and ascending aortography.  APPROACH: Right femoral artery using 6 French diagnostic catheters.  COMPLICATIONS: None, however, required several right coronary catheters for apparent visualization of right coronary artery.  INDICATIONS: This 77 year old, white female, with atypical angina had abnormal stress test and multiple cardiac risk factors of diabetes, hypertension, and peripheral vascular disease.  HEMODYNAMIC DATA: The left ventricular pressure was 214/14 and aortic pressure was 176/85 mmHg. There was a peak-to-peak 38 mm gradient across the aortic wall and a 32 mm mean gradient with an estimated aortic valve area of 1 cm sq.  LEFT VENTRICULOGRAM: The left ventriculogram showed hypodynamic wall motion with ejection fraction of about 70%. Aortic valve was calcific and required a slimy wire to cross it. On aortogram, however, there was no aortic insufficiency.  CORONARY ANATOMY: Left main: The left main coronary artery was essentially unremarkable.  Left left anterior descending: Left anterior descending coronary artery had ostial 20-30% stenosis and mid vessel 20% stenosis x2.  The diagonal vessel was unremarkable.  Left circumflex: The left circumflex coronary artery was dominant and had unremarkable obtuse marginal branch and unremarkable posterior descending coronary artery.  Right coronary artery: The right coronary artery was small and appeared unremarkable on cusp shot.  IMPRESSION: 1.  Mild coronary artery disease. 2. Hyperdynamic left ventricular systolic function. 3. Mild to moderate aortic stenosis with a 32 mm gradient across the valve.  RECOMMENDATIONS: This patient will continue medical treatment for now. Dictated by:   Birdie Riddle, M.D. Attending Physician:  Birdie Riddle DD:  11/15/00 TD:  11/15/00 Job: [id number]

## 2010-07-15 NOTE — Op Note (Signed)
[facility]  Patient:    [name], [name] Visit Number: [id number] MRN: [id number]          Service Type: DSU Location: [facility] Attending Physician:  Jerald Kief Dictated by:   Jerald Kief, M.D. Proc. Date: 03/22/01 Admit Date:  03/22/2001   CC:         [name], M.D.   Operative Report  PREOPERATIVE DIAGNOSES:       1. Abnormal uterine bleeding.                               2. Endometrial mass on sonohystogram.  POSTOPERATIVE DIAGNOSES:      1. Abnormal uterine bleeding.                               2. Endometrial mass on sonohystogram.                               3. Endometrial polyp.  PROCEDURES:                   1. Hysteroscopy.                               2. Dilation and curettage.                               3. Polypectomy.  SURGEON:                      Jerald Kief, M.D.  ANESTHESIA:                   Monitored anesthetic care and paracervical block.  ESTIMATED BLOOD LOSS:         10 cc.  SORBITOL DEFICIT:             Minimal.  INDICATIONS:                  The patient is a 77 year old who presents with abnormal uterine bleeding not responsive to hormonal management. Sonohystogram shows an endometrial polyp.  She presents today for hysteroscopy D&C for evaluation and treatment of this.  The tb were discussed and informed consent was obtained.  FINDINGS AT THE TIME OF SURGERY:              Large endometrial polyp.  Otherwise normal ostia bilaterally after removal of the polyp.  Normal endometrial lining after removal of the polyp.  DESCRIPTION OF PROCEDURE:     After adequate analgesia with the patient placed in the lateral decubitus position, she was sterilely prepped and draped.  The bladder was sterilely drained.  A Graves speculum was placed.  A tenaculum was placed on the anterior lip of the cervix.  Paracervical block was placed with 1% Xylocaine with 1:100,000 epinephrine.  The uterus  was sounded to 7 cm and easily dilated to #25 Hegar dilator.  The hysteroscope was inserted.  The above findings were noted.  The polyp was grasped with endometrial polyp forceps and easily removed, followed by sharp curettage, retrieving fragments of the base of the polyp.  This was performed until a gritty surface was felt throughout the entire endometrial  cavity.  Reexamination with the hysteroscope at this time revealed a normal-appearing endometrial cavity.  No residual polyps or other lesions were noted.  Normal-appearing ostia bilaterally and normal-appearing cervix upon removal of the hysteroscope.  The tenaculum was removed from the anterior lip of the cervix and noted to be hemostatic.  The speculum was then removed.  The patient tolerated the procedure well and was stable on transfer to the recovery room.  Estimated blood loss for the procedure was less than 10 cc.  Sorbitol deficit was minimal.  DISPOSITION:                  The patient was discharged to home with follow up in the office in 2-3 weeks.  She was sent home with a routine instruction sheet for D&C. Dictated by:   Jerald Kief, M.D. Attending Physician:  Jerald Kief DD:  03/22/01 TD:  03/23/01 Job: [id number]

## 2010-07-15 NOTE — Op Note (Signed)
NAME:  [name], [name]                      ACCOUNT NO.:  [id number]   MEDICAL RECORD NO.:  [id number]                   PATIENT TYPE:  OIB   LOCATION:  3728                                 FACILITY:  [facility]   PHYSICIAN:  Richard A. Rollene Fare, M.D.          DATE OF BIRTH:  [birth year]   DATE OF PROCEDURE:  07/23/2003  DATE OF DISCHARGE:                                 OPERATIVE REPORT   PROCEDURE:  Implantation of permanent DDDR St. Jude Identity ADX XLDR,  number [id number] pulse generator; SN:  [id number] with active fixation atrial and  ventricular electrodes.   PHYSICIAN:  Richard A. Rollene Fare, M.D.   COMPLICATIONS:  None.   ESTIMATED BLOOD LOSS:  Approximately 50 mL.   PREOPERATIVE DIAGNOSIS:  1. Sick sinus syndrome - paroxysmal atrial fibrillation.  2. Recurrent presyncope with documented sinus arrest and pauses of 5-6     seconds in sinus rhythm.  3. Status post aortic valve replacement with 21 pericardial valve, August 22, 2002.  4. Adult onset diabetes mellitus.  5. Iron deficiency anemia, workup in progress.  6. Systemic hypertension.  7. Chronic snuff  user.  8. Low back pain with lumbosacral disc disease.  9. Gastroesophageal reflux disease.  10.      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   POSTOPERATIVE DIAGNOSIS:  1. Sick sinus syndrome - paroxysmal atrial fibrillation.  2. Recurrent presyncope with documented sinus arrest and pauses of 5-6     seconds in sinus rhythm.  3. Status post aortic valve replacement with 21 pericardial valve, August 22, 2002.  4. Adult onset diabetes mellitus.  5. Iron deficiency anemia, workup in progress.  6. Systemic hypertension.  7. Chronic snuff  user.  8. Low back pain with lumbosacral disc disease.  9. Gastroesophageal reflux disease.  10.      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    Atrial electrode:  Medtronic number [id number] cm; SN:  [id number] V steroid  eluding silicone bipolar IS1 connector.  Ventricular electrode:  St. Jude active fixation screw in number [id number]  cm; SN: [id number].   Magnet rate at BOL 98.5, at [facility] magnet rate drops to 86.3.   Bipolar thresholds:  Atrial:  P 1.9 MV; resistance 417 ohms; minimal  threshold for capture 1.1 V.  Atrial unipolar:  P 3.3 MV; resistance 384 ohms; threshold 0.6 V.  Bipolar ventricular:  R 7.1 MV, resistance 880 ohms, minimal threshold 0.6  V.  Unipolar ventricular:  R 6.5 MV, resistance 560 ohms, minimal threshold 0.5  V.   DESCRIPTION OF PROCEDURE:  The patient was brought to the second floor CP  lab in a post absorptive state.  She had been instructed to stop aspirin  several days  prior to the procedure but did not do this.  The right anterior  chest was prepped and draped in the usual manner.  1% Xylocaine was used for  local anesthesia.  She was given 5 mg Valium p.o. premedication.  A right  infraclavicular curvilinear transverse incision was performed and brought  down to the prepectoral fascia using blunt dissection and electrocautery to  control hemostasis.  The subclavian vein was entered in the extrathoracic  portion with an anterior puncture using an 18 thin wall needle and using  peel away #9 Cook introducers.  The atrial and ventricular electrodes were  introduced in tandem.  The ventricular electrode was positioned in the RV  apex and screwed in under fluoroscopic control and was stable.  There was  difficulty positioning the right atrial electrode because of the relatively  small appearing right atrium and poor mobility of the electrode.  This was  exchanged using another #9 peel away [facility] introducer for a Medtronic active  fixation atrial electrode.  Through the #8 introducer, this was positioned  readily in the right atrial appendage confirmed by  fluoroscopy and  rotational maneuvers and screwed in under fluoroscopic control.  The stylets  were removed.  Threshold testing was performed unipolar and bipolar.  The  patient had prolonged sinus node recovery time of 5-6 seconds with atrial  pacing requiring ventricular back up pacing.  The retained guide-wire was  removed as were the stylets and the leads were secured in their insertion  site with the previously placed figure-of-eight #1 silk suture.  The leads  were further secured to the underlying muscle and fascia with two  interrupted #1 silk sutures around the collar for each electrode.  The  electrodes were looped behind the generator, the generator delivered into  the pocket, and loosely secured with #1 silk suture to prevent migration.  There was considerable oozing, so after initial closure of the subcutaneous  tissue with 2-0 Vicryl, this was opened up again.  Electrocautery was used  to control hemostasis because of tissue oozing and Avitene powder was also  used.  The generator was reinserted into the pocket with the electrodes  looped behind and loosely secured to the underlying muscle and fascia with  #1 silk suture to prevent migration.  The subcutaneous tissue was closed  with two separate running layers of 2-0 and then 3-0 Vicryl suture.  The  subcutaneous tissue was closed with 6-0 subcuticular Vicryl suture.  Steri-  Strips were applied.  Fluoroscopy showed good position of the atrial and  ventricular electrodes.  The sponge counts were correct.  The patient  received intermittent Nubain and Versed during the procedure for sedation.  1% Xylocaine was used for local anesthesia throughout the procedure.                                               Richard A. Rollene Fare, M.D.    RAW/MEDQ  D:  07/23/2003  T:  07/23/2003  Job:  [id number]

## 2010-07-15 NOTE — H&P (Signed)
NAME:  [name], [name]                      ACCOUNT NO.:  [id number]   MEDICAL RECORD NO.:  [id number]                   PATIENT TYPE:  INP   LOCATION:  2031                                 FACILITY:  [place]   PHYSICIAN:  Sallyanne Havers, M.D.                 DATE OF BIRTH:  June 03, 1929   DATE OF ADMISSION:  12/30/2001  DATE OF DISCHARGE:                                HISTORY & PHYSICAL   CHIEF COMPLAINT:  Shortness of breath.   HISTORY OF PRESENT ILLNESS:  This 77 year-old white female with a history of  aortic stenosis, diabetes mellitus type 2 and hypertension presented with  increased shortness of breath and dyspnea on the morning of admission.  The  patient had seen Dr. Rollene Fare one week prior to admission and was given a  beta blocker because she stated her heart murmur had began again.  On the  morning of admission she became short of breath only with exertion and then  around 9:00 in the evening became so short of breath she came to the [facility] emergency department.  She denies chest pain or cough.  She  did say she was diaphoretic earlier but not now.  She denies PND, but says  she sleeps with three pillows at night.  She can usually walk long distances  without problems.   REVIEW OF SYMPTOMS:  Positive for shortness of breath and also current  urinary tract infection for which she is now on antibiotics for.  Review of  systems is negative for fevers, weight loss, chest pain, nausea, vomiting,  blurry vision or cough and was otherwise negative.   PAST MEDICAL HISTORY:  1. Aortic stenosis with cardiac catheterization in September 2002 that     showed mild to moderate aortic stenosis and an echo done in June 2003     showed diastolic dysfunction and an ejection fraction of 60%.  2. Diabetes mellitus type 2.  3. Hyperlipidemia.  4. Iron deficiency anemia.  5. Anxiety.  6. Depression.  7. Hypertension.  8. Gastroesophageal reflux disease.  9. Hiatal  hernia.  10.      Fibromyalgia.  11.      Osteoarthritis.  12.      Skin cancer recently removed by her dermatologist.   MEDICATIONS:  1. Aspirin 325 mg q.d.  2. Glyburide 2.5 mg q.d.  3. Metformin 500 mg b.i.d.  4. Monopril 10 mg q.d.  5. Prilosec 20 mg q.d.  6. Xanax 0.5 mg b.i.d. p.r.n.  7. Zetia 10 mg q.d.  8. Toprol XL unknown dose q.d.   ALLERGIES:  No known drug allergies.   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  She has been using snuff  for greater than 50 years and has never smoked tobacco.  She has three  daughters and one  son.   FAMILY HISTORY:  Significant for a brother deceased secondary to coronary  artery disease at 41 years old.  Mom alive at 23 with diabetes mellitus and  has a pacemaker.  Dad deceased at age 8 secondary to COPD.  She also has a  daughter with diabetes.   PHYSICAL EXAMINATION:  VITAL SIGNS:  Temperature 97.1, pulse 88,  respirations 40, blood pressure 143/73, oxygen saturation 94% on non-  rebreather face mask.  GENERAL:  This is a diaphoretic white female in mild respiratory distress  with an increased work of breathing. Her daughter is present in the room.  The patient is alert and oriented to person, time and place.  HEENT:  Pupils equal, round and reactive to light and accommodation.  Extraocular movements are intact.  Sclerae are non-icteric.  Tympanic  membranes are intact with good light reflex bilaterally.  Oropharynx is  clear without exudate.  The neck is supple.  No thyromegaly.  No JVD noted.  No lymphadenopathy.  CARDIOVASCULAR:  S1 and S2 heard. Very difficult exam secondary to upper  airway noise transmitted.  LUNGS:  Coarse breath sounds bilaterally with crackles bilaterally and the  patient had an increased work of breathing with difficulty finishing a full  sentence.  ABDOMEN:  Soft, non-tender and non-distended.  No  hepatosplenomegaly.  EXTREMITIES:  No clubbing or cyanosis and trace edema in the lower extremity  bilaterally.  2+ pulses bilaterally.  SKIN:  Face appears flushed and she has multiple areas of sun damaged skin.  NEUROLOGIC:  Cranial nerves II-XII are grossly intact.  The rest of the exam  is nonfocal.  RECTAL:  Deferred.   LABORATORY DATA:  White blood count 20.9, hemoglobin 11.4, hematocrit 35.4,  platelets 354,000.  Sodium 138, potassium 3.9, chloride 109, bicarbonate 23,  BUN 20, creatinine 0.8, glucose 254, calcium 9.0. AST 57, ALT 46, alkaline  phosphatase 66, total bilirubin 0.5, total protein 7.3, albumin 3.5.  PT  11.9, INR 0.8, PTT 24.0.   EKG shows sinus tachycardia with peaked T waves in 2, 3 and AVF and elevated  ST in V3 and V5.  No study to compare to.   CK was 144, CK MB 2.8,  Troponin 0.02 with a relative index of 1.9.   ASSESSMENT AND PLAN:  This is a 77 year-old white female with a history of  aortic stenosis, diabetes mellitus type 2, hyperlipidemia and hypertension  who presents with new onset of congestive heart failure.  1. Congestive heart failure.  The patient was begun on a beta blocker one     week ago secondary to worsening aortic stenosis by echo per patient.  We     will try and obtain recent echo results and at this point since new onset     congestive heart failure, will check cardiac enzymes times three to rule     out an acute myocardial infarction.  2. Pulmonary edema and an EKG with elevated ST's in the septal leads as well     as peaked T waves in the inferiori leads.  Continue oxygen, nitroglycerin     and ACE inhibitor and a very low dose beta blocker as well as an aspirin     a day and may need to consult cardiology.  3. Diabetes mellitus type 2.  Glucose is currently elevated and we will hold     medication secondary to diuresis with Lasix and will cover with sliding     scale insulin and check  CBG's. 4. Hypertension.  Blood pressure is  elevated probably secondary to acute     stress.  We will continue low dose beta blocker and ACE inhibitor and     monitor blood pressure.  5. Urinary tract infection currently being treated with antibiotics.  This     most likely explains the elevated white     blood count.  6. Hyperlipidemia.  The patient is not on a statin currently. We will check     a fasting lipid panel and consider starting statin if LDL is greater than     100.                                               Sallyanne Havers, M.D.    AS/MEDQ  D:  12/30/2001  T:  12/30/2001  Job:  [id number]   cc:   Jodelle Gross, M.D.

## 2010-07-15 NOTE — Op Note (Signed)
NAME:  [name], [name]                      ACCOUNT NO.:  [id number]   MEDICAL RECORD NO.:  [id number]                   PATIENT TYPE:  INP   LOCATION:  2031                                 FACILITY:  [place]   PHYSICIAN:  Lilia Argue. Servando Snare, M.D.            DATE OF BIRTH:  03/10/1929   DATE OF PROCEDURE:  08/22/2002  DATE OF DISCHARGE:                                 OPERATIVE REPORT   PREOPERATIVE DIAGNOSES:  Critical aortic stenosis and question of amyloid  myocardium.   POSTOPERATIVE DIAGNOSES:  Critical aortic stenosis and question of amyloid  myocardium.   OPERATION PERFORMED:  Aortic valve replacement with a #21 pericardial tissue  valve and biopsy of myocardium.   SURGEON:  Lilia Argue. Servando Snare, M.D.   ASSISTANT:  Revonda Standard. Roxan Hockey, M.D.   ANESTHESIA:  General.   INDICATIONS FOR PROCEDURE:  The patient is a 77 year old female with known  murmur for several years with increasing symptoms of congestive heart  failure ultimately with emergency room admission for acute shortness of  breath.  Echocardiogram was done in January and June of this year and showed  critical aortic stenosis with aortic valve area of between 0.4 and 0.5.  Cardiac catheterization was done by Dr. Quay Burow which showed luminal  coronary irregularities but no high grade stenosis.  Because of the  patient's critical aortic stenosis, aortic valve replacement was  recommended.  She agreed and signed informed consent.  On echocardiogram  done by Dr. Einar Gip there was a suggestion of mild amyloid infiltration of the  heart and for this reason, a myocardial biopsy was performed.   DESCRIPTION OF PROCEDURE:  With Swann-Ganz and arterial line monitors in  place, the patient underwent general endotracheal anesthesia without  incident.  The skin of the chest and legs was prepped with Betadine and  draped in the usual sterile manner.  A median sternotomy was performed.  The  pericardium was opened.   Overall ventricular function appeared preserved.  Transesophageal echocardiogram confirmed clinical aortic stenosis.  The  pericardium was opened.  The patient was systemically heparinized.  The  ascending aorta and the right atrium were cannulated,  A retrograde  cardioplegia catheter was placed through the right atrium.  The patient's  body temperature was cooled to 30 degrees.  An aortic crossclamp was  applied.  566mL of cold blood potassium cardioplegia was administered  antegrade.  Transverse aortotomy was performed revealing a tricuspid valve  with total fusion of the left and right coronary cusps.  The valve was  excised taking care to remove all calcific debris.  Intermittently  retrograde cardioplegia was administered.  The valve was sized for a 21  pericardial tissue valve.  Pledgets were placed  on the ventricular surface  circumferentially and the valve was secured in place and seated well.  Right  and left coronary ostia were unobstructed.  The aortotomy was then closed  with horizontal  mattress suture  over felt strips.  Head was put in a down  position.  Heart was allowed to passively fill and deair.  The aortic cross-  clamp was removed with a total crossclamp time was 82 minutes.  Because of  the question of amyloid in the heart, a 16 gauge Tru-Cut needle was  introduced into the left ventricular apex both to deair the heart and also  to obtain a myocardial biopsy.  Pledgeted sutures were placed to close this  area and the specimen was submitted to pathology.  The patient required  electrical defibrillation to return to a sinus rhythm but required atrial  pacing for increased rate.  The right superior pulmonary vein vent was  removed.  The patient was then ventilated and weaned from cardiopulmonary  bypass without difficulty.  She remained hemodynamically stable.  She was  decannulated in the usual fashion.  Protamine sulfate was administered.  With the operative field  hemostatic, two atrial and two ventricular pacing  wires were applied.  Two mediastinal tubes were left in place.  The  pericardium was loosely reapproximated.  Sternum was closed with #6  stainless steel wire.  Fascia closed with interrupted 0 Vicryl, running 3-0  Vicryl in the subcutaneous tissues and 4-0 subcuticular stitch in the skin  edges.  Dry dressings were applied.  Sponge and needle counts were reported  as correct at the completion of the procedure.  The patient tolerated the  procedure without obvious complication and was transferred to the surgical  intensive care unit for further postoperative care.   Total pump time was 118 minutes.                                               Lilia Argue Servando Snare, M.D.    Mcneil Sober  D:  08/25/2002  T:  08/25/2002  Job:  [id number]   cc:   Quay Burow, M.D.  [address] [place]  [place] [address]  Fax: [phone]

## 2010-07-15 NOTE — Discharge Summary (Signed)
NAME:  [name]                      ACCOUNT NO.:  [id number]   MEDICAL RECORD NO.:  [id number]                   PATIENT TYPE:  OIB   LOCATION:  [id number]                                 FACILITY:  [place]   PHYSICIAN:  Erlene Quan, P.A.                DATE OF BIRTH:  1929-04-25   DATE OF ADMISSION:  07/23/2003  DATE OF DISCHARGE:  07/24/2003                                 DISCHARGE SUMMARY   DISCHARGE DIAGNOSES:  1. Paroxysmal atrial fibrillation and sick sinus syndrome, status post     Medtronic pacemaker implantation this admission.  2. History of aortic stenosis with tissue aortic valve replacement in June     2004.  3. Moderate coronary artery disease with an ejection fraction of 40%.  4. Iron-deficiency anemia.   HOSPITAL COURSE:  The patient is a 77 year old female followed by Dr.  Rollene Fare and [facility]. She has aortic stenosis and pericardial  tissue valve on August 22, 2002, by Dr. Servando Snare. She had a 40% LAD at that  time.  She had postoperative atrial fibrillation. She was put on amiodarone,  she says she could not tolerate this because of some fluttering.  She had a  Holter as an outpatient that apparently showed some PAF with pauses, one up  to 4-5 seconds.  This was associated with dizziness. She was admitted  electively for pacemaker on Jul 23, 2003.  She had a Medtronic permanent  pacemaker implanted by Dr. Rollene Fare without complications. Postoperatively  her amiodarone was restarted. The plan was to keep her atrial paced and  avoid ventricular pacing if possible. She is discharged on Jul 24, 2003. She  will follow up with Dr. Rollene Fare in a couple of weeks. She will have her  wound checked on June 2nd with the PA. We did set her up to see Dr. Allyn Kenner  on July 1st for evaluation of iron-deficiency anemia.   DISCHARGE MEDICATIONS:  1. Metformin 1 gm b.i.d.  2. Omeprazole 20 mg a day.  3. Zocor 40 mg a day.  4. Potassium 20 mEq a day.  5. Lasix  20 mg a day.  6. Altace 20 mg a day.  7. Lopressor 25 mg b.i.d.  8. Amiodarone 200 mg a day.   LABORATORY DATA:  White count 11.4, hemoglobin 10.4, hematocrit 32.7,  platelet count 267,000. Sodium 140, potassium 4.0, BUN 11, creatinine 0.8.   Chest x-ray after a pacemaker showed leads to be in good position. Telemetry  shows atrial pacing.   DISPOSITION:  The patient is to follow up with Dr. Rollene Fare.  If she is  tolerate amiodarone may want to consider Betapace or Rhythmol as she has  nonobstructive coronary disease.  Erlene Quan, P.Timothy Lasso  D:  07/24/2003  T:  07/26/2003  Job:  [id number]   cc:   [facility]

## 2010-08-06 ENCOUNTER — Other Ambulatory Visit: Payer: Self-pay | Admitting: Family Medicine

## 2010-08-06 DIAGNOSIS — I10 Essential (primary) hypertension: Secondary | ICD-10-CM

## 2010-08-12 ENCOUNTER — Other Ambulatory Visit: Payer: Self-pay | Admitting: Family Medicine

## 2010-08-12 NOTE — Telephone Encounter (Signed)
Received request to refill coumadin.  We have not PT/INR recorded and I do not see documentation where she is getting.  I need verification that PT/INR is being followed before refill.  LM on answering machine to call and provided emergency supply with message to call

## 2010-08-12 NOTE — Telephone Encounter (Signed)
Please advise about this refill request for Dr. Zigmund Daniel patient.

## 2010-08-26 ENCOUNTER — Ambulatory Visit: Payer: PRIVATE HEALTH INSURANCE | Admitting: Family Medicine

## 2010-09-05 ENCOUNTER — Other Ambulatory Visit: Payer: Self-pay | Admitting: Family Medicine

## 2010-09-05 NOTE — Telephone Encounter (Signed)
Refill request

## 2010-09-08 ENCOUNTER — Ambulatory Visit (INDEPENDENT_AMBULATORY_CARE_PROVIDER_SITE_OTHER): Payer: PRIVATE HEALTH INSURANCE | Admitting: Family Medicine

## 2010-09-08 VITALS — BP 126/65 | HR 74 | Temp 97.6°F | Wt 151.8 lb

## 2010-09-08 DIAGNOSIS — E785 Hyperlipidemia, unspecified: Secondary | ICD-10-CM

## 2010-09-08 DIAGNOSIS — E119 Type 2 diabetes mellitus without complications: Secondary | ICD-10-CM

## 2010-09-08 LAB — POCT GLYCOSYLATED HEMOGLOBIN (HGB A1C): Hemoglobin A1C: 6.6

## 2010-09-08 NOTE — Progress Notes (Signed)
  Subjective:    Patient ID: [name], female    DOB: August 14, 1929, 77 y.o.   MRN: [id number]  HPI Here for follow up of diabetes.  Is doing well, glucose in the low to mid 100's most of the time.  Tolerating medicine well.  Had labs recently, says they were supposed to send a copy to our office.  Told everything was ok except for her cholesterol.  Dr. Rollene Fare wanted to start her on Lipitor but she says she has tried in the past and makes her muscles ache badly.  Would like to continue on pravastatin.  No recent diet or exercise changes.     Review of Systems Denies headache, chest pain, sob, nausea, vomiting, vision changes    Objective:   Physical Exam  Constitutional: She appears well-developed and well-nourished. No distress.  HENT:  Head: Normocephalic and atraumatic.  Cardiovascular: Normal rate and regular rhythm.   Murmur heard.      2-3/6 SEM   Pulmonary/Chest: Effort normal and breath sounds normal.  Musculoskeletal: She exhibits no edema.          Assessment & Plan:

## 2010-09-08 NOTE — Assessment & Plan Note (Signed)
A1C improved, continue current medications.  F/u in three months, will check labs at that time if we have not received outside labs.

## 2010-09-08 NOTE — Patient Instructions (Signed)
Continue what you are doing, your blood sugar looks good.  I will send over refills to your pharmacy.  Please return in 3 months for follow up of your diabetes. If you have questions please feel free to call our office.

## 2010-09-08 NOTE — Assessment & Plan Note (Signed)
Told to take pravastatin by Dr. Rollene Fare, however has taken in the past and made muscles ache.  Has also tried multiple other statins with same effect.  WIll continue on pravastatin at this time and recheck lipid profile at next visit.

## 2010-09-09 ENCOUNTER — Other Ambulatory Visit: Payer: Self-pay | Admitting: Family Medicine

## 2010-09-09 NOTE — Telephone Encounter (Signed)
Refill request

## 2010-11-04 ENCOUNTER — Other Ambulatory Visit: Payer: Self-pay | Admitting: Family Medicine

## 2010-11-04 MED ORDER — METFORMIN HCL 1000 MG PO TABS: 1000.0000 mg | ORAL_TABLET | Freq: Two times a day (BID) | ORAL | Status: DC

## 2010-11-08 ENCOUNTER — Other Ambulatory Visit: Payer: Self-pay | Admitting: Family Medicine

## 2010-11-08 NOTE — Telephone Encounter (Signed)
Refill request

## 2010-11-14 ENCOUNTER — Telehealth: Payer: Self-pay | Admitting: Family Medicine

## 2010-11-14 NOTE — Telephone Encounter (Signed)
Fwd. To Dr.Matthews to refill meds .[name]

## 2010-11-14 NOTE — Telephone Encounter (Signed)
States that they had called pharm last week for her refills and still hasn't heard anything.  Needs this soon. [facility] [place]

## 2010-11-17 NOTE — Telephone Encounter (Signed)
Still hasn't heard anything about her meds.- xanax and hydrocodone - she is almost out and getting anxious

## 2010-11-17 NOTE — Telephone Encounter (Signed)
I called the pharmacy and they said that the electronic requests for both these meds were refused.  They were not able to tell me who refused them.  Am reluctant to refill in the event Dr. Zigmund Daniel does not want them refilled.  I will attempt to call patient again.  Dr. Zigmund Daniel will be back in clinic tomorrow.  If I receive approval from him to call these in I will.

## 2010-11-17 NOTE — Telephone Encounter (Signed)
Attempted to call patient back but did not get an answer nor voice mail.  Will try later.

## 2010-11-18 ENCOUNTER — Other Ambulatory Visit: Payer: Self-pay | Admitting: Family Medicine

## 2010-11-18 DIAGNOSIS — F341 Dysthymic disorder: Secondary | ICD-10-CM

## 2010-11-18 MED ORDER — ALPRAZOLAM 0.5 MG PO TABS: 0.5000 mg | ORAL_TABLET | Freq: Two times a day (BID) | ORAL | Status: DC

## 2010-11-18 MED ORDER — HYDROCODONE-ACETAMINOPHEN 5-500 MG PO TABS: 1.0000 | ORAL_TABLET | Freq: Two times a day (BID) | ORAL | Status: DC

## 2010-12-05 ENCOUNTER — Other Ambulatory Visit: Payer: Self-pay | Admitting: Family Medicine

## 2010-12-05 DIAGNOSIS — I10 Essential (primary) hypertension: Secondary | ICD-10-CM

## 2010-12-05 MED ORDER — AMLODIPINE BESY-BENAZEPRIL HCL 10-20 MG PO CAPS: 1.0000 | ORAL_CAPSULE | Freq: Every day | ORAL | Status: DC

## 2011-01-06 ENCOUNTER — Encounter: Payer: Self-pay | Admitting: Family Medicine

## 2011-01-25 ENCOUNTER — Other Ambulatory Visit: Payer: Self-pay | Admitting: Family Medicine

## 2011-01-25 DIAGNOSIS — K219 Gastro-esophageal reflux disease without esophagitis: Secondary | ICD-10-CM

## 2011-01-25 DIAGNOSIS — F341 Dysthymic disorder: Secondary | ICD-10-CM

## 2011-01-25 MED ORDER — OMEPRAZOLE 20 MG PO CPDR: 20.0000 mg | DELAYED_RELEASE_CAPSULE | Freq: Every day | ORAL | Status: DC

## 2011-01-31 ENCOUNTER — Ambulatory Visit: Payer: PRIVATE HEALTH INSURANCE | Admitting: Family Medicine

## 2011-02-03 ENCOUNTER — Other Ambulatory Visit: Payer: Self-pay | Admitting: Family Medicine

## 2011-02-03 DIAGNOSIS — K219 Gastro-esophageal reflux disease without esophagitis: Secondary | ICD-10-CM

## 2011-02-03 MED ORDER — METFORMIN HCL 1000 MG PO TABS: 1000.0000 mg | ORAL_TABLET | Freq: Two times a day (BID) | ORAL | Status: DC

## 2011-02-06 ENCOUNTER — Ambulatory Visit (INDEPENDENT_AMBULATORY_CARE_PROVIDER_SITE_OTHER): Payer: PRIVATE HEALTH INSURANCE | Admitting: Family Medicine

## 2011-02-06 ENCOUNTER — Encounter: Payer: Self-pay | Admitting: Family Medicine

## 2011-02-06 DIAGNOSIS — R300 Dysuria: Secondary | ICD-10-CM

## 2011-02-06 DIAGNOSIS — M1710 Unilateral primary osteoarthritis, unspecified knee: Secondary | ICD-10-CM

## 2011-02-06 DIAGNOSIS — F341 Dysthymic disorder: Secondary | ICD-10-CM

## 2011-02-06 DIAGNOSIS — IMO0002 Reserved for concepts with insufficient information to code with codable children: Secondary | ICD-10-CM

## 2011-02-06 DIAGNOSIS — E119 Type 2 diabetes mellitus without complications: Secondary | ICD-10-CM

## 2011-02-06 LAB — POCT UA - MICROSCOPIC ONLY

## 2011-02-06 LAB — POCT URINALYSIS DIPSTICK
Bilirubin, UA: NEGATIVE
Blood, UA: NEGATIVE
Glucose, UA: NEGATIVE
Nitrite, UA: NEGATIVE
Urobilinogen, UA: 0.2

## 2011-02-06 LAB — POCT GLYCOSYLATED HEMOGLOBIN (HGB A1C): Hemoglobin A1C: 6.4

## 2011-02-06 MED ORDER — ALPRAZOLAM 0.5 MG PO TABS: 0.5000 mg | ORAL_TABLET | Freq: Two times a day (BID) | ORAL | Status: DC | PRN

## 2011-02-06 MED ORDER — METFORMIN HCL 1000 MG PO TABS: 1000.0000 mg | ORAL_TABLET | Freq: Two times a day (BID) | ORAL | Status: DC

## 2011-02-06 MED ORDER — HYDROCODONE-ACETAMINOPHEN 5-500 MG PO TABS: 1.0000 | ORAL_TABLET | Freq: Two times a day (BID) | ORAL | Status: DC

## 2011-02-06 NOTE — Patient Instructions (Addendum)
It was nice seeing you today I will see you back in 3 months to follow up on your diabetes.   I will let you know the results of your urine when it comes back from the lab.

## 2011-02-08 NOTE — Progress Notes (Signed)
  Subjective:    Patient ID: [name], female    DOB: 1929-05-23, 77 y.o.   MRN: [id number]  HPI 1. Diabetes:  She is compliant with current medications, denies side effects.  Unsure exact numbers from checking glucose, but states they are "good" around the low 100's.   2. Anxiety/Depression:  Needs refill on xanax.  On steady dose "for years."  Not interested in stopping or changing to longer acting benzo.  She states "I don't think I could leave the house without them"   3. Urinary symptoms:  Recently diagnosed with UTI at [facility] urine was sent for culture.  Was placed on 10 day course of amoxicillin which she states she completed.  Since that time she is still having mild symptoms of dysuria.  She denies fever, chills, increased back pain.  4. Chronic pain:  Needs refill on vicodin.  Has been on this for a few years 2/2 to back pain and OA of the knees.  Has been on steady dose and this works well for her.  She denies any dizziness, constipation on this.  If not taking this she feels like she would not be as independent as she is.    Review of Systems     Objective:   Physical Exam  Constitutional: She is oriented to person, place, and time. She appears well-developed and well-nourished. No distress.  Neck: Normal range of motion. Neck supple.  Cardiovascular: Normal rate, regular rhythm and normal heart sounds.   Pulmonary/Chest: Effort normal and breath sounds normal.  Musculoskeletal: She exhibits no edema.  Neurological: She is alert and oriented to person, place, and time.  Skin: Skin is warm and dry.          Assessment & Plan:

## 2011-02-12 DIAGNOSIS — R300 Dysuria: Secondary | ICD-10-CM | POA: Insufficient documentation

## 2011-02-12 NOTE — Assessment & Plan Note (Signed)
Has been on steady dose of vicodin, working well for her.  No signs of abuse.  Will refill.

## 2011-02-12 NOTE — Assessment & Plan Note (Signed)
Recent UTI, finished course of amoxicillin.  Still with mild dysuria.  Recheck UA today, specimen not a good clean catch specimen. No red flags

## 2011-02-12 NOTE — Assessment & Plan Note (Signed)
Has been on stable dose for many years, is dependent at this point.  Does not wish to change to longer acting benzo.  Will refill xanax for now and continue to pursue changing to longer acting benzo at f/u

## 2011-02-12 NOTE — Assessment & Plan Note (Signed)
Diabetes still well controlled, no change in current therapy.  Foot exam performed today.  F/u in 3 months

## 2011-03-08 ENCOUNTER — Other Ambulatory Visit: Payer: Self-pay | Admitting: Family Medicine

## 2011-03-09 NOTE — Telephone Encounter (Signed)
Refill request

## 2011-05-12 ENCOUNTER — Other Ambulatory Visit: Payer: Self-pay | Admitting: Family Medicine

## 2011-05-12 DIAGNOSIS — F341 Dysthymic disorder: Secondary | ICD-10-CM

## 2011-05-12 NOTE — Telephone Encounter (Signed)
Patient is calling for refills on [facility] [place]

## 2011-05-14 MED ORDER — HYDROCODONE-ACETAMINOPHEN 5-500 MG PO TABS: 1.0000 | ORAL_TABLET | Freq: Two times a day (BID) | ORAL | Status: DC

## 2011-05-14 MED ORDER — ALPRAZOLAM 0.5 MG PO TABS: 0.5000 mg | ORAL_TABLET | Freq: Two times a day (BID) | ORAL | Status: DC | PRN

## 2011-05-14 NOTE — Telephone Encounter (Signed)
Rx phoned in and left on pharmacy voice-mail.

## 2011-05-24 ENCOUNTER — Encounter: Payer: Self-pay | Admitting: Family Medicine

## 2011-05-24 NOTE — Progress Notes (Signed)
Patient ID: [name], female   DOB: 1930-01-19, 78 y.o.   MRN: [id number] [facility] [place] [facility].  Have never written this for patient.  Request for [name] denied and faxed back to pharmacy

## 2011-06-08 ENCOUNTER — Other Ambulatory Visit: Payer: Self-pay | Admitting: Family Medicine

## 2011-06-08 MED ORDER — AMLODIPINE BESY-BENAZEPRIL HCL 10-20 MG PO CAPS: 1.0000 | ORAL_CAPSULE | Freq: Every day | ORAL | Status: DC

## 2011-06-28 ENCOUNTER — Telehealth: Payer: Self-pay | Admitting: Family Medicine

## 2011-06-28 NOTE — Telephone Encounter (Signed)
Needs verbal order for test strips - Has faxed 3 times

## 2011-06-28 NOTE — Telephone Encounter (Signed)
Request form received  placed in MD box.

## 2011-06-28 NOTE — Telephone Encounter (Signed)
Completed and placed in bin to be faxed back.

## 2011-06-28 NOTE — Telephone Encounter (Signed)
Unable to find that we have received any fax regarding test strips. Called pharmacy and it is for [employer]. Ask them to send again.

## 2011-07-14 ENCOUNTER — Other Ambulatory Visit: Payer: Self-pay | Admitting: Family Medicine

## 2011-07-14 MED ORDER — ATENOLOL 50 MG PO TABS: 75.0000 mg | ORAL_TABLET | Freq: Every day | ORAL | Status: DC

## 2011-07-18 ENCOUNTER — Encounter: Payer: Self-pay | Admitting: Family Medicine

## 2011-07-18 ENCOUNTER — Ambulatory Visit (INDEPENDENT_AMBULATORY_CARE_PROVIDER_SITE_OTHER): Payer: PRIVATE HEALTH INSURANCE | Admitting: Family Medicine

## 2011-07-18 VITALS — BP 136/73 | HR 70 | Temp 98.3°F | Ht 64.0 in | Wt 146.0 lb

## 2011-07-18 DIAGNOSIS — F329 Major depressive disorder, single episode, unspecified: Secondary | ICD-10-CM

## 2011-07-18 DIAGNOSIS — M1710 Unilateral primary osteoarthritis, unspecified knee: Secondary | ICD-10-CM

## 2011-07-18 DIAGNOSIS — I509 Heart failure, unspecified: Secondary | ICD-10-CM

## 2011-07-18 DIAGNOSIS — F341 Dysthymic disorder: Secondary | ICD-10-CM

## 2011-07-18 DIAGNOSIS — K219 Gastro-esophageal reflux disease without esophagitis: Secondary | ICD-10-CM

## 2011-07-18 DIAGNOSIS — E119 Type 2 diabetes mellitus without complications: Secondary | ICD-10-CM

## 2011-07-18 LAB — POCT GLYCOSYLATED HEMOGLOBIN (HGB A1C): Hemoglobin A1C: 6.4

## 2011-07-18 MED ORDER — SERTRALINE HCL 50 MG PO TABS: 50.0000 mg | ORAL_TABLET | Freq: Every day | ORAL | Status: DC

## 2011-07-18 MED ORDER — HYDROCODONE-ACETAMINOPHEN 7.5-500 MG PO TABS: 1.0000 | ORAL_TABLET | Freq: Two times a day (BID) | ORAL | Status: DC | PRN

## 2011-07-18 MED ORDER — OMEPRAZOLE 20 MG PO CPDR: 20.0000 mg | DELAYED_RELEASE_CAPSULE | Freq: Two times a day (BID) | ORAL | Status: DC

## 2011-07-18 NOTE — Patient Instructions (Signed)
Thank you for coming in today, it was good to see you Your diabetes is well controlled, no changes to medication I will see you back in 4 weeks to follow up on the new medication (sertraline-zoloft) Please call with any questions.

## 2011-07-24 NOTE — Progress Notes (Signed)
  Subjective:    Patient ID: [name], female    DOB: [birth year], 78 y.o.   MRN: [id number]  HPI  1. DM:  DM has been well controlled over the past few months.  Checks sugars infrequently, typically between 100-125.  She states she saw eye doctor ~3 months ago.  Denies any symptoms of low blood sugar.  2. OA/Chronic pain:  Current pain medication "barely takes the edge off" daily pain.  She is getting less and less relief from current pain medication dosages.   Chronic Pain Follow-Up Assessment Chronic Pain Diagnosis: OA involving knees, low back Pain scale rating at its BEST in the past 24 hours (1 to 10): 6 Pain scale rating at its WORST in the past 24 hours (1 to 10): 10 Adverse Effects:  (None_x_ Nausea__ Vomiting__ Confusion__ Sleepiness__ Fatigue__ Constipation__ Other__) Treatment of Adverse Effects: N/a Since the last clinic visit, how much relief have pain treatment and medication provided? Please circle the one percentage that shows how much relief you have received: 0%__ 10%__ 20%__ 30%__ 40%_x_ 50%__ 60%__ 70%__ 80%__ 90%__ 100%__ Elta Guadeloupe the one number that describes how, during the past 24 hours, pain has interfered with your: General Activity 0__ 1__ 2__ 3__ 4__ 5__ 6__ 7__ 8_x_ 9__ 10__ Does not interfere      Completely interferes Mood 0__ 1__ 2__ 3__ 4__ 5__ 6__ 7__ 8_x_ 9__ 10__ Does not interfere      Completely interferes Ability to work (in or out of the home) 0__ 1__ 2__ 3__ 4_x_ 5__ 6__ 7__ 8__ 9__ 10__ Does not interfere      Completely interferes Interactions with other people 0__ 1__ 2__ 3__ 4__ 5__ 6__ 7_x_ 8__ 9__ 10__ Does not interfere      Completely interferes Sleep 0__ 1__ 2__ 3__ 4__ 5__ 6_x_ 7__ 8__ 9__ 10__ Does not interfere      Completely interferes  Enjoyment of life 0__ 1__ 2__ 3__ 4__ 5__ 6__ 7_x_ 8__ 9__ 10__ Does not interfere      Completely interferes  3. Depression:  PHQ screening done by nurse with score of 20.  She feels  like she has had increasing symptoms of depression over the past few months. + anhedonia and decreased appetite and sleep. Feels like pain being uncontrolled has contributed to symptoms.  Has been tried on other anti-depressants in the past but little relief of symptoms.  Feels like she would benefit from trial of another antidepressant.     Review of Systems     Objective:   Physical Exam  Constitutional: She is oriented to person, place, and time. She appears well-developed and well-nourished. No distress.  HENT:  Mouth/Throat: Oropharynx is clear and moist.  Cardiovascular: Normal rate and regular rhythm.   Pulmonary/Chest: Effort normal and breath sounds normal.  Musculoskeletal: Normal range of motion. She exhibits no edema.  Neurological: She is alert and oriented to person, place, and time.  Psychiatric: She has a normal mood and affect. Her behavior is normal.          Assessment & Plan:

## 2011-07-25 DIAGNOSIS — F329 Major depressive disorder, single episode, unspecified: Secondary | ICD-10-CM | POA: Insufficient documentation

## 2011-07-25 NOTE — Assessment & Plan Note (Signed)
Well controlled, no changes in medication.  F/u 6 months.

## 2011-07-25 NOTE — Assessment & Plan Note (Addendum)
History of anxiety and depression.  Has typically been more anxiety but now with more depressive symptoms. Tried multiple medications in the past.  Has never tried zoloft, will give her a trial of this as it may help with her anxiety as well.  F/u 4 weeks.

## 2011-07-25 NOTE — Assessment & Plan Note (Signed)
No signs of abuse.  Her pain does not seem to be well controlled at current dose.  Probably mediated to some degree by her depression symptoms.  I am willing to increase to 7.5 mg of hydrocodone in hopes of better pain relief.

## 2011-08-08 ENCOUNTER — Other Ambulatory Visit: Payer: Self-pay | Admitting: *Deleted

## 2011-08-08 MED ORDER — METFORMIN HCL 1000 MG PO TABS: 1000.0000 mg | ORAL_TABLET | Freq: Two times a day (BID) | ORAL | Status: DC

## 2011-08-16 ENCOUNTER — Other Ambulatory Visit: Payer: Self-pay | Admitting: Family Medicine

## 2011-08-16 DIAGNOSIS — F341 Dysthymic disorder: Secondary | ICD-10-CM

## 2011-08-16 MED ORDER — ALPRAZOLAM 0.5 MG PO TABS: 0.5000 mg | ORAL_TABLET | Freq: Two times a day (BID) | ORAL | Status: DC | PRN

## 2011-08-21 ENCOUNTER — Ambulatory Visit: Payer: PRIVATE HEALTH INSURANCE | Admitting: Home Health Services

## 2011-09-04 ENCOUNTER — Other Ambulatory Visit: Payer: Self-pay | Admitting: *Deleted

## 2011-09-04 MED ORDER — AMLODIPINE BESY-BENAZEPRIL HCL 10-20 MG PO CAPS: 1.0000 | ORAL_CAPSULE | Freq: Every day | ORAL | Status: DC

## 2011-09-06 ENCOUNTER — Encounter: Payer: Self-pay | Admitting: *Deleted

## 2011-09-06 NOTE — Telephone Encounter (Signed)
This encounter was created in error - please disregard.

## 2011-11-08 ENCOUNTER — Other Ambulatory Visit: Payer: Self-pay | Admitting: *Deleted

## 2011-11-08 MED ORDER — METFORMIN HCL 1000 MG PO TABS: 1000.0000 mg | ORAL_TABLET | Freq: Two times a day (BID) | ORAL | Status: DC

## 2011-11-08 MED ORDER — ATENOLOL 50 MG PO TABS: 75.0000 mg | ORAL_TABLET | Freq: Every day | ORAL | Status: DC

## 2011-11-14 ENCOUNTER — Other Ambulatory Visit: Payer: Self-pay | Admitting: Family Medicine

## 2011-11-14 DIAGNOSIS — M1710 Unilateral primary osteoarthritis, unspecified knee: Secondary | ICD-10-CM

## 2011-11-14 MED ORDER — HYDROCODONE-ACETAMINOPHEN 7.5-500 MG PO TABS: 1.0000 | ORAL_TABLET | Freq: Two times a day (BID) | ORAL | Status: AC | PRN

## 2011-12-07 ENCOUNTER — Other Ambulatory Visit: Payer: Self-pay | Admitting: *Deleted

## 2011-12-07 MED ORDER — AMLODIPINE BESY-BENAZEPRIL HCL 10-20 MG PO CAPS: 1.0000 | ORAL_CAPSULE | Freq: Every day | ORAL | Status: DC

## 2011-12-11 ENCOUNTER — Other Ambulatory Visit: Payer: Self-pay | Admitting: Family Medicine

## 2011-12-11 DIAGNOSIS — F341 Dysthymic disorder: Secondary | ICD-10-CM

## 2011-12-11 MED ORDER — ALPRAZOLAM 0.5 MG PO TABS: 0.5000 mg | ORAL_TABLET | Freq: Two times a day (BID) | ORAL | Status: DC | PRN

## 2012-01-11 ENCOUNTER — Other Ambulatory Visit (HOSPITAL_COMMUNITY): Payer: Self-pay | Admitting: *Deleted

## 2012-01-11 ENCOUNTER — Other Ambulatory Visit (HOSPITAL_COMMUNITY): Payer: Self-pay

## 2012-01-11 DIAGNOSIS — R079 Chest pain, unspecified: Secondary | ICD-10-CM

## 2012-01-11 DIAGNOSIS — Z952 Presence of prosthetic heart valve: Secondary | ICD-10-CM

## 2012-01-16 ENCOUNTER — Other Ambulatory Visit: Payer: Self-pay | Admitting: Family Medicine

## 2012-01-16 DIAGNOSIS — F341 Dysthymic disorder: Secondary | ICD-10-CM

## 2012-01-16 MED ORDER — ALPRAZOLAM 0.5 MG PO TABS: 0.5000 mg | ORAL_TABLET | Freq: Two times a day (BID) | ORAL | Status: DC | PRN

## 2012-01-18 ENCOUNTER — Encounter (HOSPITAL_COMMUNITY): Payer: PRIVATE HEALTH INSURANCE

## 2012-01-18 ENCOUNTER — Ambulatory Visit (HOSPITAL_COMMUNITY): Payer: PRIVATE HEALTH INSURANCE

## 2012-01-31 ENCOUNTER — Encounter (HOSPITAL_COMMUNITY): Payer: PRIVATE HEALTH INSURANCE

## 2012-01-31 ENCOUNTER — Ambulatory Visit (HOSPITAL_COMMUNITY): Payer: PRIVATE HEALTH INSURANCE

## 2012-02-06 ENCOUNTER — Other Ambulatory Visit: Payer: Self-pay | Admitting: Family Medicine

## 2012-02-06 DIAGNOSIS — K219 Gastro-esophageal reflux disease without esophagitis: Secondary | ICD-10-CM

## 2012-02-06 MED ORDER — OMEPRAZOLE 20 MG PO CPDR: 20.0000 mg | DELAYED_RELEASE_CAPSULE | Freq: Two times a day (BID) | ORAL | Status: DC

## 2012-02-23 ENCOUNTER — Other Ambulatory Visit: Payer: Self-pay | Admitting: *Deleted

## 2012-02-23 DIAGNOSIS — F341 Dysthymic disorder: Secondary | ICD-10-CM

## 2012-03-04 ENCOUNTER — Encounter: Payer: Self-pay | Admitting: Family Medicine

## 2012-03-04 ENCOUNTER — Ambulatory Visit (INDEPENDENT_AMBULATORY_CARE_PROVIDER_SITE_OTHER): Payer: PRIVATE HEALTH INSURANCE | Admitting: Family Medicine

## 2012-03-04 VITALS — BP 120/61 | HR 70 | Temp 98.6°F | Wt 135.0 lb

## 2012-03-04 DIAGNOSIS — D6489 Other specified anemias: Secondary | ICD-10-CM

## 2012-03-04 DIAGNOSIS — J988 Other specified respiratory disorders: Secondary | ICD-10-CM

## 2012-03-04 DIAGNOSIS — IMO0002 Reserved for concepts with insufficient information to code with codable children: Secondary | ICD-10-CM

## 2012-03-04 DIAGNOSIS — F341 Dysthymic disorder: Secondary | ICD-10-CM

## 2012-03-04 DIAGNOSIS — Z23 Encounter for immunization: Secondary | ICD-10-CM

## 2012-03-04 DIAGNOSIS — M1710 Unilateral primary osteoarthritis, unspecified knee: Secondary | ICD-10-CM

## 2012-03-04 DIAGNOSIS — E119 Type 2 diabetes mellitus without complications: Secondary | ICD-10-CM

## 2012-03-04 LAB — CBC
Hemoglobin: 10.8 g/dL — ABNORMAL LOW (ref 12.0–15.0)
MCH: 25.2 pg — ABNORMAL LOW (ref 26.0–34.0)
MCV: 78.1 fL (ref 78.0–100.0)
Platelets: 278 10*3/uL (ref 150–400)
RBC: 4.29 MIL/uL (ref 3.87–5.11)
WBC: 11.4 10*3/uL — ABNORMAL HIGH (ref 4.0–10.5)

## 2012-03-04 LAB — COMPREHENSIVE METABOLIC PANEL
Alkaline Phosphatase: 37 U/L — ABNORMAL LOW (ref 39–117)
BUN: 28 mg/dL — ABNORMAL HIGH (ref 6–23)
CO2: 27 mEq/L (ref 19–32)
Glucose, Bld: 112 mg/dL — ABNORMAL HIGH (ref 70–99)
Sodium: 143 mEq/L (ref 135–145)
Total Bilirubin: 0.4 mg/dL (ref 0.3–1.2)
Total Protein: 6.7 g/dL (ref 6.0–8.3)

## 2012-03-04 MED ORDER — HYDROCODONE-ACETAMINOPHEN 5-500 MG PO TABS: 1.0000 | ORAL_TABLET | Freq: Two times a day (BID) | ORAL | Status: DC | PRN

## 2012-03-04 MED ORDER — ALPRAZOLAM 0.5 MG PO TABS: 0.5000 mg | ORAL_TABLET | Freq: Two times a day (BID) | ORAL | Status: DC | PRN

## 2012-03-07 ENCOUNTER — Other Ambulatory Visit: Payer: Self-pay | Admitting: Family Medicine

## 2012-03-07 ENCOUNTER — Other Ambulatory Visit: Payer: Self-pay | Admitting: *Deleted

## 2012-03-07 MED ORDER — ATENOLOL 50 MG PO TABS: 75.0000 mg | ORAL_TABLET | Freq: Every day | ORAL | Status: DC

## 2012-03-10 DIAGNOSIS — J988 Other specified respiratory disorders: Secondary | ICD-10-CM | POA: Insufficient documentation

## 2012-03-10 NOTE — Assessment & Plan Note (Signed)
On zoloft and xanax.  Symptoms well controlled on medication.  Refilled xanax.

## 2012-03-10 NOTE — Progress Notes (Signed)
  Subjective:    Patient ID: [name], female    DOB: 05/01/1929, 79 y.o.   MRN: [id number]  HPI  1. DM: CHRONIC DIABETES  Disease Monitoring  Blood Sugar Ranges: 120-130 fasting  Polyuria: no   Visual problems: no   Medication Compliance: yes  Medication Side Effects  Hypoglycemia: no   Preventitive Health Care  Eye Exam: Due next month  Foot Exam: Today  Diet pattern: Typically eats whatever she wants  Exercise: Occasional walking    2.  Chronic pain:  Chronic Pain Follow-Up Assessment Chronic Pain Diagnosis: OA bilateral knees and Low back pain Pain scale rating at its BEST in the past 24 hours (1 to 10): 2 Pain scale rating at its WORST in the past 24 hours (1 to 10): 7 Adverse Effects:  (None_x_ Nausea__ Vomiting__ Confusion__ Sleepiness__ Fatigue__ Constipation__ Other__) Treatment of Adverse Effects: n/a Since the last clinic visit, how much relief have pain treatment and medication provided? Please circle the one percentage that shows how much relief you have received: 0%__ 10%__ 20%__ 30%__ 40%__ 50%__ 60%__ 70%__ 80%_x_ 90%__ 100%__ Elta Guadeloupe the one number that describes how, during the past 24 hours, pain has interfered with your: General Activity 0__ 1__ 2_x_ 3__ 4__ 5__ 6__ 7__ 8__ 9__ 10__ Does not interfere      Completely interferes Mood 0__ 1__ 2__ 3_x_ 4__ 5__ 6__ 7__ 8__ 9__ 10__ Does not interfere      Completely interferes Interactions with other people 0__ 1__ 2_x_ 3__ 4__ 5__ 6__ 7__ 8__ 9__ 10__ Does not interfere      Completely interferes Sleep 0__ 1__ 2__ 3__ 4__x 5__ 6__ 7__ 8__ 9__ 10__ Does not interfere      Completely interferes  Enjoyment of life 0__ 1__ 2__ 3__ 4_x_ 5__ 6__ 7__ 8__ 9__ 10__ Does not interfere      Completely interferes  Needs refill on hydrocodone, current dose working well for her.   3. Anxiety and depression:  Has been on xanax for numerous years.  Could not see herself not having this medication.  Has been  out for ~1 week and anxiety has been severe, often wakes up with anxious feeling if not using medication.    4. Congestion:  Congestion and full feeling to sinuses x2 days.  Also with mild cough.  SHe denies productive cough, fevers, chills, shortness of breath, sore throat or body aches.    Review of Systems Per HPI    Objective:   Physical Exam  Constitutional:       Elderly female, nad   HENT:  Head: Normocephalic and atraumatic.  Mouth/Throat: Oropharynx is clear and moist.       No sinus tenderness  Neck: Neck supple.  Cardiovascular: Normal rate and regular rhythm.   Pulmonary/Chest: Effort normal and breath sounds normal.  Musculoskeletal: She exhibits no edema.  Neurological: She is alert.          Assessment & Plan:

## 2012-03-10 NOTE — Assessment & Plan Note (Signed)
No signs of bacterial infection or sinusitis.  Advised supportive therapy as needed. Return if not resolivng.

## 2012-03-10 NOTE — Assessment & Plan Note (Signed)
Hydrocodone refilled, pain well controlled.

## 2012-03-10 NOTE — Assessment & Plan Note (Signed)
Diabetes well controlled, no changes in medication.  Foot exam done today.  Reminded to go to eye doctor.

## 2012-03-27 ENCOUNTER — Telehealth: Payer: Self-pay | Admitting: *Deleted

## 2012-03-27 MED ORDER — PRAVASTATIN SODIUM 40 MG PO TABS: 40.0000 mg | ORAL_TABLET | Freq: Every day | ORAL | Status: DC

## 2012-03-27 NOTE — Telephone Encounter (Signed)
Message copied by [place] Pink on Wed Mar 27, 2012  3:13 PM ------      Message from: MATTHEWS, CODY      Created: Wed Mar 27, 2012  2:53 PM       LDL still high, will increase pravastatin to 40mg .

## 2012-03-27 NOTE — Telephone Encounter (Signed)
Unable to reach patient, please see message from Dr Zigmund Daniel. Patient to increase pravastatin to 40 mg daily.Busick, Kevin Fenton

## 2012-03-27 NOTE — Addendum Note (Signed)
Addended by: Luetta Nutting on: 03/27/2012 02:54 PM   Modules accepted: Orders

## 2012-03-29 NOTE — Telephone Encounter (Signed)
Left message for patient to return call.Busick, Kevin Fenton

## 2012-03-29 NOTE — Telephone Encounter (Signed)
Gave message to daughter and she said that she hasn't been taking the simvastatin and that her heart doctor (Dr Rollene Fare) had put her on a low dose of Crestor 5mg .  States that is "messes with her muscles" Also she is going in for surgery on Tues, Feb 4 to have her pacemaker changed.

## 2012-04-01 ENCOUNTER — Other Ambulatory Visit: Payer: Self-pay | Admitting: *Deleted

## 2012-04-01 MED ORDER — CEFAZOLIN SODIUM-DEXTROSE 2-3 GM-% IV SOLR
2.0000 g | INTRAVENOUS | Status: AC
  Filled 2012-04-01: qty 50

## 2012-04-01 MED ORDER — SODIUM CHLORIDE 0.45 % IV SOLN: INTRAVENOUS | Status: DC

## 2012-04-01 MED ORDER — SODIUM CHLORIDE 0.9 % IR SOLN
80.0000 mg | Status: AC
  Filled 2012-04-01: qty 2

## 2012-04-02 ENCOUNTER — Encounter (HOSPITAL_COMMUNITY)
Admission: RE | Disposition: A | Discharge status: Home / Self Care | Payer: Self-pay | Source: Ambulatory Visit | Attending: Cardiovascular Disease

## 2012-04-02 ENCOUNTER — Ambulatory Visit (HOSPITAL_COMMUNITY)
Admission: RE | Admit: 2012-04-02 | Discharge: 2012-04-02 | Disposition: A | Discharge status: Home / Self Care | Payer: PRIVATE HEALTH INSURANCE | Source: Ambulatory Visit | Attending: Cardiovascular Disease | Admitting: Cardiovascular Disease

## 2012-04-02 DIAGNOSIS — Z45018 Encounter for adjustment and management of other part of cardiac pacemaker: Secondary | ICD-10-CM | POA: Insufficient documentation

## 2012-04-02 DIAGNOSIS — Z45010 Encounter for checking and testing of cardiac pacemaker pulse generator [battery]: Secondary | ICD-10-CM

## 2012-04-02 HISTORY — PX: PACEMAKER INSERTION: SHX728

## 2012-04-02 HISTORY — PX: PERMANENT PACEMAKER GENERATOR CHANGE: SHX6022

## 2012-04-02 LAB — GLUCOSE, CAPILLARY: Glucose-Capillary: 100 mg/dL — ABNORMAL HIGH (ref 70–99)

## 2012-04-02 LAB — SURGICAL PCR SCREEN: MRSA, PCR: NEGATIVE

## 2012-04-02 LAB — PROTIME-INR: INR: 1.08 (ref 0.00–1.49)

## 2012-04-02 SURGERY — PERMANENT PACEMAKER GENERATOR CHANGE
Anesthesia: LOCAL

## 2012-04-02 MED ORDER — MUPIROCIN 2 % EX OINT
TOPICAL_OINTMENT | CUTANEOUS | Status: AC
  Filled 2012-04-02: qty 22

## 2012-04-02 MED ORDER — CHLORHEXIDINE GLUCONATE 4 % EX LIQD
60.0000 mL | Freq: Once | CUTANEOUS | Status: DC
  Filled 2012-04-02: qty 60

## 2012-04-02 MED ORDER — ONDANSETRON HCL 4 MG/2ML IJ SOLN: 4.0000 mg | Freq: Four times a day (QID) | INTRAMUSCULAR | Status: DC | PRN

## 2012-04-02 MED ORDER — ACETAMINOPHEN 325 MG PO TABS: 325.0000 mg | ORAL_TABLET | ORAL | Status: DC | PRN

## 2012-04-02 MED ORDER — FENTANYL CITRATE 0.05 MG/ML IJ SOLN
INTRAMUSCULAR | Status: AC
  Filled 2012-04-02: qty 2

## 2012-04-02 MED ORDER — MIDAZOLAM HCL 2 MG/2ML IJ SOLN
INTRAMUSCULAR | Status: AC
  Filled 2012-04-02: qty 2

## 2012-04-02 MED ORDER — MUPIROCIN 2 % EX OINT
TOPICAL_OINTMENT | Freq: Two times a day (BID) | CUTANEOUS | Status: DC
  Administered 2012-04-02: 1 via NASAL
  Filled 2012-04-02: qty 22

## 2012-04-02 MED ORDER — LIDOCAINE HCL (PF) 1 % IJ SOLN
INTRAMUSCULAR | Status: AC
  Filled 2012-04-02: qty 60

## 2012-04-02 MED ORDER — SODIUM CHLORIDE 0.9 % IJ SOLN: 3.0000 mL | INTRAMUSCULAR | Status: DC | PRN

## 2012-04-02 MED ORDER — SODIUM CHLORIDE 0.9 % IV SOLN: INTRAVENOUS | Status: DC

## 2012-04-02 MED ORDER — HEPARIN (PORCINE) IN NACL 2-0.9 UNIT/ML-% IJ SOLN
INTRAMUSCULAR | Status: AC
  Filled 2012-04-02: qty 500

## 2012-04-02 MED ORDER — CEFAZOLIN SODIUM-DEXTROSE 2-3 GM-% IV SOLR
INTRAVENOUS | Status: AC
  Filled 2012-04-02: qty 50

## 2012-04-02 MED ORDER — SODIUM CHLORIDE 0.9 % IV SOLN
INTRAVENOUS | Status: DC
  Administered 2012-04-02: 10:00:00 via INTRAVENOUS

## 2012-04-02 NOTE — CV Procedure (Signed)
[name], [name], 79 y.o., Sep 24, 1929  MRN: [id number]  CSN: [id number]  Admit Dt: 04/02/12  Procedure report  Procedure performed:  1. Dual chamber pacemaker generator changeout  2. Light sedation  Reason for procedure:  1. Device generator at elective replacement interval  Procedure performed by:  Sanda Klein, MD  Complications:  None  Estimated blood loss:  <5 mL  Medications administered during procedure:  Ancef 2 g intravenously, lidocaine 1% 30 mL locally, fentanyl 25 mcg intravenously, Versed 1 mg intravenously Device details:   Web designer. Jude Accent DR RF model number [id number], serial number [id number] Right atrial lead (chronic) Medtronic [id number] , serial [id number] (implanted 07/23/2003) Right ventricular lead (chronic)  St. Jude [id number], serial number [id number] (implanted same)   Explanted generator St Jude Ibdentity ADx XL DR [id number], serial number  [id number] (implanted sam3)  Procedure details:  After the risks and benefits of the procedure were discussed the patient provided informed consent. She was brought to the cardiac catheter lab in the fasting state. The patient was prepped and draped in usual sterile fashion. Local anesthesia with 1% lidocaine was administered to to the right infraclavicular area. A 5-6cm horizontal incision was made parallel with and 2-3 cm caudal to the right clavicle, in the area of an old scar. An older scar was seen closer to the left clavicle. Using minimal electrocautery and mostly sharp and blunt dissection the prepectoral pocket was opened carefully to avoid injury to thet loops of chronic leads. Extensive dissection was necessary and the pocket was revised to accommodate the larger RF device. The device was explanted. The pocket was carefully inspected for hemostasis and flushed with copious amounts of antibiotic solution.  The leads were disconnected from the old generator and testing of the lead parameters showed  excellent values. The new generator was connected to the chronic leads, with appropriate pacing noted.   The entire system was then carefully inserted in the pocket with care been taking that the leads and device assumed a comfortable position without pressure on the incision. Great care was taken that the leads be located deep to the generator. The pocket was then closed in layers using 2 layers of 2-0 Vicryl and cutaneous staples after which a sterile dressing was applied.   At the end of the procedure lead parameters were similar to preprocedural values.  Sanda Klein, MD, [place] [phone] office [phone] pager 04/02/2012

## 2012-04-02 NOTE — H&P (Signed)
  Date of Initial H&P: 03/26/2012, R. Rollene Fare, MD  History reviewed, patient examined, no change in status, stable for surgery. Dual chamber pacemaker generator at [facility], here for generator change. This procedure has been fully reviewed with the patient and written informed consent has been obtained. Sanda Klein, MD, [place] [phone] office [phone] pager 04/02/2012

## 2012-04-15 ENCOUNTER — Telehealth: Payer: Self-pay | Admitting: Family Medicine

## 2012-04-15 ENCOUNTER — Other Ambulatory Visit: Payer: Self-pay | Admitting: Family Medicine

## 2012-04-15 MED ORDER — HYDROCODONE-ACETAMINOPHEN 5-325 MG PO TABS: 1.0000 | ORAL_TABLET | Freq: Two times a day (BID) | ORAL | Status: DC | PRN

## 2012-04-15 NOTE — Telephone Encounter (Signed)
Called to say that pharm doesn't make the 5-500 hydrocodone - was supposed to have them increased anyway - pl[facility] [place] [place]

## 2012-04-15 NOTE — Telephone Encounter (Signed)
Will fwd. To PCP for review .[name]  

## 2012-04-16 NOTE — Telephone Encounter (Signed)
Rx placed up front to be faxed.  Norco 5/325.

## 2012-04-18 ENCOUNTER — Other Ambulatory Visit (HOSPITAL_COMMUNITY): Payer: Self-pay | Admitting: Cardiovascular Disease

## 2012-04-18 DIAGNOSIS — Z952 Presence of prosthetic heart valve: Secondary | ICD-10-CM

## 2012-04-18 DIAGNOSIS — R0989 Other specified symptoms and signs involving the circulatory and respiratory systems: Secondary | ICD-10-CM

## 2012-04-22 ENCOUNTER — Other Ambulatory Visit: Payer: Self-pay | Admitting: Family Medicine

## 2012-05-07 ENCOUNTER — Other Ambulatory Visit: Payer: Self-pay | Admitting: Family Medicine

## 2012-05-08 ENCOUNTER — Encounter (HOSPITAL_COMMUNITY): Payer: PRIVATE HEALTH INSURANCE

## 2012-05-08 ENCOUNTER — Inpatient Hospital Stay (HOSPITAL_COMMUNITY): Admission: RE | Admit: 2012-05-08 | Payer: PRIVATE HEALTH INSURANCE | Source: Ambulatory Visit

## 2012-05-08 ENCOUNTER — Ambulatory Visit (HOSPITAL_COMMUNITY)
Admission: RE | Admit: 2012-05-08 | Discharge: 2012-05-08 | Disposition: A | Discharge status: Home / Self Care | Payer: PRIVATE HEALTH INSURANCE | Source: Ambulatory Visit | Attending: Cardiovascular Disease | Admitting: Cardiovascular Disease

## 2012-05-08 DIAGNOSIS — Z952 Presence of prosthetic heart valve: Secondary | ICD-10-CM

## 2012-05-08 DIAGNOSIS — Z09 Encounter for follow-up examination after completed treatment for conditions other than malignant neoplasm: Secondary | ICD-10-CM | POA: Insufficient documentation

## 2012-05-08 NOTE — Progress Notes (Signed)
[facility]   2D echo completed 05/08/2012.   Jamison Neighbor, RDCS

## 2012-06-14 ENCOUNTER — Encounter: Payer: Self-pay | Admitting: Pharmacist Clinician (PhC)/ Clinical Pharmacy Specialist

## 2012-06-14 DIAGNOSIS — I4891 Unspecified atrial fibrillation: Secondary | ICD-10-CM

## 2012-06-14 DIAGNOSIS — Z7901 Long term (current) use of anticoagulants: Secondary | ICD-10-CM | POA: Insufficient documentation

## 2012-06-14 DIAGNOSIS — I4819 Other persistent atrial fibrillation: Secondary | ICD-10-CM | POA: Insufficient documentation

## 2012-06-17 ENCOUNTER — Other Ambulatory Visit: Payer: Self-pay | Admitting: Family Medicine

## 2012-07-01 ENCOUNTER — Telehealth: Payer: Self-pay | Admitting: *Deleted

## 2012-07-01 NOTE — Telephone Encounter (Signed)
[facility] written by Dr. Rollene Fare. His office called and stated that it would complete PA. Pt has not been prescribed Ambien under her PCP here at [facility]. Will fax to Dr. Lowella Fairy office for completion. [name], Ines Bloomer, RN

## 2012-07-05 ENCOUNTER — Other Ambulatory Visit: Payer: Self-pay | Admitting: Family Medicine

## 2012-07-10 ENCOUNTER — Other Ambulatory Visit: Payer: Self-pay | Admitting: Family Medicine

## 2012-07-12 ENCOUNTER — Other Ambulatory Visit: Payer: Self-pay | Admitting: Family Medicine

## 2012-07-25 ENCOUNTER — Other Ambulatory Visit: Payer: Self-pay | Admitting: *Deleted

## 2012-08-01 LAB — PROTIME-INR: INR: 2.8 — AB (ref <=1.1)

## 2012-08-02 ENCOUNTER — Ambulatory Visit (INDEPENDENT_AMBULATORY_CARE_PROVIDER_SITE_OTHER): Payer: PRIVATE HEALTH INSURANCE | Admitting: Family Medicine

## 2012-08-02 ENCOUNTER — Encounter: Payer: Self-pay | Admitting: Family Medicine

## 2012-08-02 VITALS — BP 130/76 | HR 76 | Ht 64.0 in | Wt 136.8 lb

## 2012-08-02 DIAGNOSIS — M1710 Unilateral primary osteoarthritis, unspecified knee: Secondary | ICD-10-CM

## 2012-08-02 DIAGNOSIS — M25569 Pain in unspecified knee: Secondary | ICD-10-CM

## 2012-08-02 DIAGNOSIS — L570 Actinic keratosis: Secondary | ICD-10-CM

## 2012-08-02 DIAGNOSIS — E119 Type 2 diabetes mellitus without complications: Secondary | ICD-10-CM

## 2012-08-02 DIAGNOSIS — M25562 Pain in left knee: Secondary | ICD-10-CM

## 2012-08-02 MED ORDER — METHYLPREDNISOLONE ACETATE 40 MG/ML IJ SUSP
40.0000 mg | Freq: Once | INTRAMUSCULAR | Status: AC
  Administered 2012-08-02: 40 mg via INTRA_ARTICULAR

## 2012-08-02 MED ORDER — HYDROCODONE-ACETAMINOPHEN 7.5-325 MG PO TABS: 1.0000 | ORAL_TABLET | Freq: Two times a day (BID) | ORAL | Status: DC | PRN

## 2012-08-02 NOTE — Patient Instructions (Addendum)
Thank you for coming in today, it was good to see you Follow up in 3-4 months.  Joint Injection Care After Refer to this sheet in the next few days. These instructions provide you with information on caring for yourself after you have had a joint injection. Your caregiver also may give you more specific instructions. Your treatment has been planned according to current medical practices, but problems sometimes occur. Call your caregiver if you have any problems or questions after your procedure. After any type of joint injection, it is not uncommon to experience:  Soreness, swelling, or bruising around the injection site.  Mild numbness, tingling, or weakness around the injection site caused by the numbing medicine used before or with the injection. It also is possible to experience the following effects associated with the specific agent after injection:  Iodine-based contrast agents:  Allergic reaction (itching, hives, widespread redness, and swelling beyond the injection site).  Corticosteroids (These effects are rare.):  Allergic reaction.  Increased blood sugar levels (If you have diabetes and you notice that your blood sugar levels have increased, notify your caregiver).  Increased blood pressure levels.  Mood swings.  Hyaluronic acid in the use of viscosupplementation.  Temporary heat or redness.  Temporary rash and itching.  Increased fluid accumulation in the injected joint. These effects all should resolve within a day after your procedure.  HOME CARE INSTRUCTIONS  Limit yourself to light activity the day of your procedure. Avoid lifting heavy objects, bending, stooping, or twisting.  Take prescription or over-the-counter pain medication as directed by your caregiver.  You may apply ice to your injection site to reduce pain and swelling the day of your procedure. Ice may be applied 3-4 times:  Put ice in a plastic bag.  Place a towel between your skin and the  bag.  Leave the ice on for no longer than 15-20 minutes each time. SEEK IMMEDIATE MEDICAL CARE IF:   Pain and swelling get worse rather than better or extend beyond the injection site.  Numbness does not go away.  Blood or fluid continues to leak from the injection site.  You have chest pain.  You have swelling of your face or tongue.  You have trouble breathing or you become dizzy.  You develop a fever, chills, or severe tenderness at the injection site that last longer than 1 day. MAKE SURE YOU:  Understand these instructions.  Watch your condition.  Get help right away if you are not doing well or if you get worse. Document Released: 10/27/2010 Document Revised: 05/08/2011 Document Reviewed: 10/27/2010 [facility] Patient Information 2014 [place].

## 2012-08-04 NOTE — Assessment & Plan Note (Signed)
Hydrocodone refilled.  L knee injected today, tolerated well.

## 2012-08-04 NOTE — Assessment & Plan Note (Signed)
Has been well controlled.  Continue current medications, check A1c today.  She is due for eye exam and foot exam at next appt.

## 2012-08-04 NOTE — Progress Notes (Signed)
  Subjective:    Patient ID: [name], female    DOB: May 04, 1929, 79 y.o.   MRN: [id number]  Diabetes    1. DM:  CHRONIC DIABETES  Disease Monitoring  Blood Sugar Ranges: 135-140  Polyuria: no   Visual problems: no   Medication Compliance: yes  Medication Side Effects  Hypoglycemia: no   Preventitive Health Care  Eye Exam: Due  Diet pattern: Does not follow any specific diet, generally healthy.  Exercise: walking occasionally    2.  Knee pain:  Patient with bilateral knee pain, L >>R.  She is fairly active but sometimes uses a cane for ambulation.  She denies any falls.  She is using hydrocodone for knee and back pain which works well for her, however her L knee is still bothersome.  She has had her knee injected a few times in the past and this has been very helpful.  Last time it was injected was ~2 years ago.  She would like to have an injection today.    3. Skin issue:  Reports dry skin, she has been using different moisturizers but none seem to be helping.  She denies pain, itching, blistering, fever.    Review of Systems Per HPI    Objective:   Physical Exam  Constitutional: She appears well-nourished. No distress.  Neck: Neck supple.  Cardiovascular: Normal rate, regular rhythm and normal heart sounds.   Pulmonary/Chest: Effort normal and breath sounds normal. No respiratory distress.  Musculoskeletal: She exhibits no edema.  Neurological: She is alert.  Gait normal   Skin:  Multiple actinic keratoses on arms and legs.  Skin on legs is dry and flaky and she does have some venous stasis changes.      INJECTION: Patient was given informed consent, signed copy in the chart.  Discussed specific risks with injection and coumadin use, she elects to proceed with procedure. Appropriate time out was taken. Area prepped and draped in usual sterile fashion. 1 cc of methylprednisolone 40 mg/ml plus  4 cc of 1% lidocaine without epinephrine was injected into the L  Knee using a(n) anterior approach. The patient tolerated the procedure well. There were no immediate complications. Post procedure instructions were given.       Assessment & Plan:

## 2012-08-04 NOTE — Assessment & Plan Note (Signed)
Multiple areas on arms and legs.  Too large to treat with cryo.  She is not interested in 5-fu at this time.  Discussed using sunscreens and applying good moisturizer to areas of dry skin.

## 2012-08-05 ENCOUNTER — Other Ambulatory Visit: Payer: Self-pay | Admitting: *Deleted

## 2012-08-05 ENCOUNTER — Other Ambulatory Visit: Payer: Self-pay | Admitting: Family Medicine

## 2012-08-05 ENCOUNTER — Other Ambulatory Visit: Payer: Self-pay | Admitting: Cardiovascular Disease

## 2012-08-05 DIAGNOSIS — K219 Gastro-esophageal reflux disease without esophagitis: Secondary | ICD-10-CM

## 2012-08-05 MED ORDER — OMEPRAZOLE 20 MG PO CPDR: 20.0000 mg | DELAYED_RELEASE_CAPSULE | Freq: Two times a day (BID) | ORAL | Status: DC

## 2012-08-06 ENCOUNTER — Ambulatory Visit (INDEPENDENT_AMBULATORY_CARE_PROVIDER_SITE_OTHER): Payer: Self-pay | Admitting: Pharmacist Clinician (PhC)/ Clinical Pharmacy Specialist

## 2012-08-06 ENCOUNTER — Other Ambulatory Visit: Payer: Self-pay | Admitting: *Deleted

## 2012-08-06 DIAGNOSIS — Z7901 Long term (current) use of anticoagulants: Secondary | ICD-10-CM

## 2012-08-06 DIAGNOSIS — I4891 Unspecified atrial fibrillation: Secondary | ICD-10-CM

## 2012-08-06 MED ORDER — METFORMIN HCL 1000 MG PO TABS: ORAL_TABLET | ORAL | Status: DC

## 2012-08-06 NOTE — Telephone Encounter (Signed)
Requested Prescriptions   Pending Prescriptions Disp Refills  . metFORMIN (GLUCOPHAGE) 1000 MG tablet 60 tablet 2   Tildon Husky, RN-BSN

## 2012-08-07 ENCOUNTER — Other Ambulatory Visit: Payer: Self-pay | Admitting: *Deleted

## 2012-08-07 DIAGNOSIS — K219 Gastro-esophageal reflux disease without esophagitis: Secondary | ICD-10-CM

## 2012-08-07 MED ORDER — OMEPRAZOLE 20 MG PO CPDR: 20.0000 mg | DELAYED_RELEASE_CAPSULE | Freq: Two times a day (BID) | ORAL | Status: DC

## 2012-08-07 NOTE — Telephone Encounter (Signed)
Requested Prescriptions   Pending Prescriptions Disp Refills  . omeprazole (PRILOSEC) 20 MG capsule 60 capsule 5    Sig: Take 1 capsule (20 mg total) by mouth 2 (two) times daily.   Tildon Husky, RN-BSN

## 2012-09-05 ENCOUNTER — Other Ambulatory Visit: Payer: Self-pay

## 2012-09-06 ENCOUNTER — Ambulatory Visit: Payer: PRIVATE HEALTH INSURANCE | Admitting: Pharmacist Clinician (PhC)/ Clinical Pharmacy Specialist

## 2012-09-06 LAB — PACEMAKER DEVICE OBSERVATION

## 2012-09-14 ENCOUNTER — Encounter: Payer: Self-pay | Admitting: Cardiovascular Disease

## 2012-09-16 ENCOUNTER — Other Ambulatory Visit: Payer: Self-pay | Admitting: Family Medicine

## 2012-10-15 LAB — PROTIME-INR: INR: 5.3 — AB (ref <=1.1)

## 2012-10-16 ENCOUNTER — Ambulatory Visit (INDEPENDENT_AMBULATORY_CARE_PROVIDER_SITE_OTHER): Payer: Self-pay | Admitting: Pharmacist Clinician (PhC)/ Clinical Pharmacy Specialist

## 2012-10-16 DIAGNOSIS — I4891 Unspecified atrial fibrillation: Secondary | ICD-10-CM

## 2012-10-16 DIAGNOSIS — Z7901 Long term (current) use of anticoagulants: Secondary | ICD-10-CM

## 2012-11-01 ENCOUNTER — Other Ambulatory Visit: Payer: Self-pay | Admitting: Family Medicine

## 2012-11-07 ENCOUNTER — Telehealth: Payer: Self-pay | Admitting: Family Medicine

## 2012-11-07 NOTE — Telephone Encounter (Signed)
Pt called and would like enough medication to get her until her appointment in Oct. She would like vicodin. JW

## 2012-11-07 NOTE — Telephone Encounter (Signed)
Will fwd. To new PCP for review. (pt has upcoming appt 12/03/12) .[name]

## 2012-11-08 MED ORDER — HYDROCODONE-ACETAMINOPHEN 7.5-325 MG PO TABS: 1.0000 | ORAL_TABLET | Freq: Two times a day (BID) | ORAL | Status: DC | PRN

## 2012-11-08 NOTE — Addendum Note (Signed)
Addended by: Olin Hauser on: 11/08/2012 05:22 PM   Modules accepted: Orders

## 2012-11-08 NOTE — Telephone Encounter (Addendum)
Reviewed patient's chart. Patient's previous Rx for Norco 7.5-325 was for 3 month supply if taken as prescribed, which is appropriate that she has run out since her last appointment was 08/02/2012. [facility] 7.5-325mg  take 1 tablet two times daily PRN for pain, #60, Refills 0. This prescription is to cover her for 1 month until her appointment with me on 12/03/2012.  [name], DO

## 2012-11-12 ENCOUNTER — Ambulatory Visit (INDEPENDENT_AMBULATORY_CARE_PROVIDER_SITE_OTHER): Payer: PRIVATE HEALTH INSURANCE | Admitting: Pharmacist Clinician (PhC)/ Clinical Pharmacy Specialist

## 2012-11-12 DIAGNOSIS — Z7901 Long term (current) use of anticoagulants: Secondary | ICD-10-CM

## 2012-11-12 DIAGNOSIS — I4891 Unspecified atrial fibrillation: Secondary | ICD-10-CM

## 2012-11-14 ENCOUNTER — Other Ambulatory Visit: Payer: Self-pay | Admitting: Family Medicine

## 2012-11-20 ENCOUNTER — Ambulatory Visit (INDEPENDENT_AMBULATORY_CARE_PROVIDER_SITE_OTHER): Payer: PRIVATE HEALTH INSURANCE | Admitting: Pharmacist Clinician (PhC)/ Clinical Pharmacy Specialist

## 2012-11-20 DIAGNOSIS — I4891 Unspecified atrial fibrillation: Secondary | ICD-10-CM

## 2012-11-20 DIAGNOSIS — Z7901 Long term (current) use of anticoagulants: Secondary | ICD-10-CM

## 2012-11-20 LAB — PROTIME-INR: INR: 1.2 — AB (ref <=1.1)

## 2012-12-03 ENCOUNTER — Ambulatory Visit (INDEPENDENT_AMBULATORY_CARE_PROVIDER_SITE_OTHER): Payer: PRIVATE HEALTH INSURANCE | Admitting: Family Medicine

## 2012-12-03 ENCOUNTER — Encounter: Payer: Self-pay | Admitting: Family Medicine

## 2012-12-03 VITALS — BP 135/71 | HR 70 | Ht 64.0 in | Wt 134.8 lb

## 2012-12-03 DIAGNOSIS — M1710 Unilateral primary osteoarthritis, unspecified knee: Secondary | ICD-10-CM

## 2012-12-03 DIAGNOSIS — E119 Type 2 diabetes mellitus without complications: Secondary | ICD-10-CM

## 2012-12-03 DIAGNOSIS — I10 Essential (primary) hypertension: Secondary | ICD-10-CM

## 2012-12-03 LAB — COMPREHENSIVE METABOLIC PANEL WITH GFR
ALT: 15 U/L (ref 0–35)
AST: 18 U/L (ref 0–37)
Albumin: 4.3 g/dL (ref 3.5–5.2)
Alkaline Phosphatase: 34 U/L — ABNORMAL LOW (ref 39–117)
BUN: 16 mg/dL (ref 6–23)
CO2: 29 meq/L (ref 19–32)
Calcium: 9.7 mg/dL (ref 8.4–10.5)
Chloride: 103 meq/L (ref 96–112)
Creat: 1.08 mg/dL (ref 0.50–1.10)
Glucose, Bld: 103 mg/dL — ABNORMAL HIGH (ref 70–99)
Potassium: 4.6 meq/L (ref 3.5–5.3)
Sodium: 138 meq/L (ref 135–145)
Total Bilirubin: 0.5 mg/dL (ref 0.3–1.2)
Total Protein: 7.1 g/dL (ref 6.0–8.3)

## 2012-12-03 LAB — POCT GLYCOSYLATED HEMOGLOBIN (HGB A1C): Hemoglobin A1C: 6

## 2012-12-03 MED ORDER — MOMETASONE FUROATE 50 MCG/ACT NA SUSP: 2.0000 | Freq: Every day | NASAL | Status: DC

## 2012-12-03 MED ORDER — HYDROCODONE-ACETAMINOPHEN 7.5-325 MG PO TABS: 1.0000 | ORAL_TABLET | Freq: Two times a day (BID) | ORAL | Status: DC | PRN

## 2012-12-03 MED ORDER — ALPRAZOLAM 0.5 MG PO TABS: ORAL_TABLET | ORAL | Status: DC

## 2012-12-03 NOTE — Patient Instructions (Addendum)
Dear [name], Thank you for coming in to clinic today. It was good to meet you and your daughter!  Today we discussed your Diabetes, Blood Pressure, Knee Pain and Arthritis. 1. Your diabetes is well controlled. HgbA1c 6.0, which is very good. Keep up the good work with your diet and daily activity. Keep checking your sugars and taking your Metformin regularly. 2. Your blood pressure is good today 135/71. Continue taking your medications. Check your pressure at home when you feel weak, and if it is too low you may need to skip a dose of medication or be seen at the office. 3. For your Knee pain and arthritis, I think that you are doing a good job with your daily activities. You may continue taking your Norco for pain up to 2 times every day as needed. If you feel like you can skip a dose, this would be no problem. Always be cautious about being too drowsy or sleepy, to prevent falls. Remember to use ice and elevation if your knee is swollen. 4. Please discuss your cholesterol medication with Dr. Mallie Mussel. Let me know if there is anything we can do to assist. It is important for reducing risk of heart disease. 5. Try to find a new eye doctor for next year. 6. I will review your blood work and your eye exam, if there is any concern or problem we will call you with results and ask you to come back to the office if we are making a change. Otherwise, if you don't hear from [place], then it is normal and we can discuss your results at your next visit.  Please schedule a follow-up appointment with 3 months for DM and BP. Refills  If you have any other questions or concerns, please feel free to call the clinic to contact me. You may also schedule an earlier appointment if necessary.  However, if your symptoms get significantly worse, please go to the Emergency Department to seek immediate medical attention.  [name], DO [facility]   Arthritis, Nonspecific Arthritis is  pain, redness, warmth, or puffiness (inflammation) of a joint. The joint may be stiff or hurt when you move it. One or more joints may be affected. There are many types of arthritis. Your doctor may not know what type you have right away. The most common cause of arthritis is wear and tear on the joint (osteoarthritis). HOME CARE   Only take medicine as told by your doctor.  Rest the joint as much as possible.  Raise (elevate) your joint if it is puffy.  Use crutches if the painful joint is in your leg.  Drink enough fluids to keep your pee (urine) clear or pale yellow.  Follow your doctor's diet instructions.  Use cold packs for very bad joint pain for 10 to 15 minutes every hour. Ask your doctor if it is okay for you to use hot packs.  Exercise as told by your doctor.  Take a warm shower if you have stiffness in the morning.  Move your sore joints throughout the day. GET HELP RIGHT AWAY IF:   You have a fever.  You have very bad joint pain, puffiness, or redness.  You have many joints that are painful and puffy.  You are not getting better with treatment.  You have very bad back pain or leg weakness.  You cannot control when you poop (bowel movement) or pee (urinate).  You do not feel better in 24 hours or  are getting worse.  You are having side effects from your medicine. MAKE SURE YOU:   Understand these instructions.  Will watch your condition.  Will get help right away if you are not doing well or get worse. Document Released: 05/10/2009 Document Revised: 08/15/2011 Document Reviewed: 05/10/2009 [facility] Patient Information 2014 [place], [place].

## 2012-12-03 NOTE — Assessment & Plan Note (Addendum)
Well-controlled BP, today 135/71. Meds -  Amlodipine-Benazepril 10-20mg  daily, Atenolol 75mg  daily (1.5x daily 50mg  tabs), Furosemide 20mg  daily  Plan:  1. Continue current BP meds.  2. Re-check BMP. Trend serum Cr (last 1.29, 03/04/12) baseline Cr closer to 1.0, if further elevated will need to re-evaluate and hold nephrotoxic meds (ACEi, Lasix), if stable or improved then continue current treatment. 2. Lifestyle Modifications - Quit smoking. Stay active, walking around house when able. Decrease salt intake, increase potassium rich foods (vegetables). 3. Monitor BP at home or at drug store occasionally.

## 2012-12-03 NOTE — Assessment & Plan Note (Signed)
Chronic OA bilateral knees (L>R), good functional status, ambulation w/ cane and assistance, self-sufficient ADLs. Last injection 07/2012 good results. Not interested and not a candidate for knee replacement surgery. Pain controlled well with Norco 7.5/325 x 2 daily PRN, does not need everyday. Helps her stay active.  Plan: 1. Refilled Norco (#90 pills, 0 refills, printed). Also, printed additional refill Norco (#90 pills, 0 refills, do not fill until 01/13/13) 2. Continue daily activity 3. Consider future injection if pain worsens

## 2012-12-03 NOTE — Progress Notes (Signed)
Subjective:     Patient ID: [name], female   DOB: 1929-04-17, 79 y.o.   MRN: [id number]  HPI  CHRONIC DM, Type 2: Current HgbA1c 6.0 (12/03/2012), last HgbA1c 5.9 (03/04/12).  Did not bring CBG readings today, but able to report range of readings. CBGs: Low 90, High >200, Avg 135  Meds: Metformin 1000mg  BID. Reports good compliance w/o side-effects. Currently on ACEi.  Denies hypoglycemia, polyuria, visual changes, numbness or tingling.  Complications: No peripheral neuropathy or hx DM retinopathy  HM: Eye Exam (retinal scan performed 12/03/12 in clinic), Foot Exam (performed 12/03/12)  Lifestyle: Diet (reportedly eats anything, well-balanced, moderation of portions)  Exercise (no structured exercise due to decreased ambulation with osteoarthritis, but remains active daily around house and outside)  CHRONIC HYPERTENSION: Normotensive BP today, 135/71. Reported occasionally feels weak, measured BP in 110s. Meds -  Amlodipine-Benazepril 10-20mg  daily, Atenolol 75mg  daily (1.5x daily 50mg  tabs), Furosemide 20mg  daily Reports good compliance, took meds today. Tolerating well, w/o complaints. Denies CP, dyspnea, claudication, HA, edema, dizziness / lightheadedness  LEFT KNEE PAIN, OSTEOARTHRITIS: Reports long hx chronic bilateral knee pain, L>R. Previously told by Orthopedic surgery that she would need her knees replaced. Currently is not interested in knee replacement surgery. She is functioning well, able to perform ADLs, ambulates around house, uses cane when necessary and cautious about falls. Recent worsening of L-knee pain, 1 week ago, inc swelling in knee has since improved (returned to baseline), notes some mild tenderness in front of L-knee. Continues to weight bear without problem. Takes Norco 7.5/325 up to 2 tabs daily PRN pain. Notes that does not need this everyday.  PMH: Followed by Cardiology (Dr. Rollene Fare at [facility]) for Afib (anticoagulation w/ coumadin), CHF (dry wt 130s,  on lasix 20mg  daily, s/p Aortic valve replacement, pacemaker (x2, replaced 02/2012)  Social Hx: Current smoker  Review of Systems  See above HPI.     Objective:   Physical Exam  BP 135/71  Pulse 70  Ht 5\' 4"  (1.626 m)  Wt 134 lb 12.8 oz (61.145 kg)  BMI 23.13 kg/m2 General - very pleasant, well-appearing 79 yr female, NAD, accompanied by daughter [name] HEENT - PERRLA, EOMI, MMM, pharynx clear Neck - soft, non-tender, no LAD Heart - RRR, [id number] systolic ejection murmur loudest at R-2 ICS Lungs - CTAB, no wheezing, rhonchi, or crackles heard. Normal work of breathing MSK - L-Knee: good active ROM w/o tenderness, mild tender to palpation anterior b/l joint space, no obvious effusion vs R knee, no erythema, +crepitus on exam, structurally intact Ext - mild +1 non-pitting edema bilateral LE, non-tender, +2 peripheral pulses Neuro - grossly non-focal, muscle str intact 5/5 all ext, distal sensation to light touch intact, gait slow DM Foot exam - intact monofilament sensation bilaterally, no ulcers or wounds. Noted significant dry flaking skin.

## 2012-12-03 NOTE — Assessment & Plan Note (Addendum)
Type 2 DM continues to be well-controled. Current HgbA1c 6.0 (12/03/2012), last HgbA1c 5.9 (03/04/12). Meds: Metformin 1000mg  BID. No complications, side-effects. HM: Eye Exam (retinal scan performed 12/03/12 in clinic), Foot Exam (performed 12/03/12)  Plan:  1. Continued well-control. Continue current therapy, Metformin 1000mg  BID 2. Advised patient to bring in glucometer or readings to next visit  3. Continue lifestyle modifications.  4. Discussed foot hygiene, examine for ulcers or wounds. 5. Plan to find new eye doctor.

## 2012-12-05 ENCOUNTER — Encounter: Payer: Self-pay | Admitting: Family Medicine

## 2012-12-05 ENCOUNTER — Encounter: Payer: Self-pay | Admitting: Home Health Services

## 2012-12-05 DIAGNOSIS — E113299 Type 2 diabetes mellitus with mild nonproliferative diabetic retinopathy without macular edema, unspecified eye: Secondary | ICD-10-CM | POA: Insufficient documentation

## 2012-12-05 DIAGNOSIS — E11329 Type 2 diabetes mellitus with mild nonproliferative diabetic retinopathy without macular edema: Secondary | ICD-10-CM | POA: Insufficient documentation

## 2012-12-12 ENCOUNTER — Other Ambulatory Visit: Payer: Self-pay | Admitting: Family Medicine

## 2012-12-12 NOTE — Telephone Encounter (Signed)
Will fwd to MD for review.  Dalinda Heidt L, CMA  

## 2012-12-19 ENCOUNTER — Ambulatory Visit (INDEPENDENT_AMBULATORY_CARE_PROVIDER_SITE_OTHER): Payer: Self-pay | Admitting: Pharmacist Clinician (PhC)/ Clinical Pharmacy Specialist

## 2012-12-19 DIAGNOSIS — I4891 Unspecified atrial fibrillation: Secondary | ICD-10-CM

## 2012-12-19 DIAGNOSIS — Z7901 Long term (current) use of anticoagulants: Secondary | ICD-10-CM

## 2012-12-29 ENCOUNTER — Encounter: Payer: Self-pay | Admitting: *Deleted

## 2012-12-30 ENCOUNTER — Ambulatory Visit (INDEPENDENT_AMBULATORY_CARE_PROVIDER_SITE_OTHER): Payer: PRIVATE HEALTH INSURANCE | Admitting: Pharmacist Clinician (PhC)/ Clinical Pharmacy Specialist

## 2012-12-30 ENCOUNTER — Ambulatory Visit (INDEPENDENT_AMBULATORY_CARE_PROVIDER_SITE_OTHER): Payer: PRIVATE HEALTH INSURANCE | Admitting: Cardiovascular Disease

## 2012-12-30 ENCOUNTER — Encounter: Payer: Self-pay | Admitting: Cardiovascular Disease

## 2012-12-30 ENCOUNTER — Ambulatory Visit: Payer: PRIVATE HEALTH INSURANCE | Admitting: Pharmacist Clinician (PhC)/ Clinical Pharmacy Specialist

## 2012-12-30 VITALS — BP 133/73 | HR 72 | Resp 20 | Ht 64.0 in | Wt 132.6 lb

## 2012-12-30 DIAGNOSIS — E785 Hyperlipidemia, unspecified: Secondary | ICD-10-CM

## 2012-12-30 DIAGNOSIS — Z7901 Long term (current) use of anticoagulants: Secondary | ICD-10-CM

## 2012-12-30 DIAGNOSIS — Z950 Presence of cardiac pacemaker: Secondary | ICD-10-CM

## 2012-12-30 DIAGNOSIS — I4891 Unspecified atrial fibrillation: Secondary | ICD-10-CM

## 2012-12-30 DIAGNOSIS — I10 Essential (primary) hypertension: Secondary | ICD-10-CM

## 2012-12-30 LAB — PACEMAKER DEVICE OBSERVATION
AL IMPEDENCE PM: 490 Ohm
ATRIAL PACING PM: 96
BAMS-0001: 150 {beats}/min
BAMS-0003: 70 {beats}/min
BATTERY VOLTAGE: 2.99 V
DEVICE MODEL PM: 7445049
RV LEAD IMPEDENCE PM: 330 Ohm
VENTRICULAR PACING PM: 2.5

## 2012-12-30 LAB — POCT INR: INR: 1.7

## 2012-12-30 NOTE — Patient Instructions (Addendum)
Remote monitoring is used to monitor your pacemaker from home. This monitoring reduces the number of office visits required to check your device to one time per year. It allows [place] to keep an eye on the functioning of your device to ensure it is working properly. You are scheduled for a device check from home on 04-02-2013. You may send your transmission at any time that day. If you have a wireless device, the transmission will be sent automatically. After your physician reviews your transmission, you will receive a postcard with your next transmission date.  Your physician has recommended you make the following change in your medication: [place] physician recommends that you schedule a follow-up appointment in: 6 months.

## 2012-12-31 ENCOUNTER — Telehealth: Payer: Self-pay | Admitting: Internal Medicine

## 2012-12-31 NOTE — Telephone Encounter (Signed)
Pt has appt 12-30-12 with Dr C/mt

## 2013-01-08 ENCOUNTER — Encounter: Payer: Self-pay | Admitting: Cardiovascular Disease

## 2013-01-08 DIAGNOSIS — Z950 Presence of cardiac pacemaker: Secondary | ICD-10-CM | POA: Insufficient documentation

## 2013-01-08 NOTE — Progress Notes (Signed)
Patient ID: [name], female   DOB: December 23, 1929, 79 y.o.   MRN: [id number]      Reason for office visit Status post aortic valve replacement, sinus node dysfunction status post pacemaker, paroxysmal atrial fibrillation, peripheral venous insufficiency  [name] was cared for by Dr. Terance Ice until he recently retired.  In 2004 she underwent replacement of her aortic valve with a 21 mm biological prosthesis by Dr. Servando Snare.  In 2005 she received a dual-chamber permanent pacemaker and had a generator change in 2014 ([employer] accident DR RF device). Her atrial lead has slightly elevated pacing thresholds (2.25 V at 0.5 ms pulse width) but her device otherwise functions normally. She has very infrequent episodes of brief paroxysmal atrial tachycardia. She does have a history of atrial fibrillation for which he takes warfarin. This is monitored by our Coumadin clinic but she has a blood drawn in [place] at the urgent care. She has severe hypercholesterolemia requiring simultaneous treatment with Crestor and Zetia but by coronary angiography before valve replacement shown he had a 40% LAD stenosis without other meaningful coronary disease. She has preserved left ventricular systolic function with an echo was recently performed in March of this year. Prostatic valve function was normal at that time.   She is here today for routine followup and has few complaints. She has occasional ankle edema, occasional orthostatic dizziness which appeared to be related to the time of day when her medications would have their peak effect.     Allergies  Allergen Reactions  . Pravachol [Pravastatin Sodium]     myalgias    Current Outpatient Prescriptions  Medication Sig Dispense Refill  . ALPRAZolam (XANAX) 0.5 MG tablet TAKE 1 TABLET BY MOUTH 2 TIMES DAILY AS NEEDED FOR ANXIETY  60 tablet  3  . amLODipine-benazepril (LOTREL) 10-20 MG per capsule TAKE ONE CAPSULE BY MOUTH EVERY DAY   30 capsule  3  . atenolol (TENORMIN) 50 MG tablet TAKE 1 AND 1/2 TABLETS (75 MG TOTAL) BY MOUTH DAILY.  45 tablet  3  . CRESTOR 10 MG tablet Take 10 mg by mouth daily.      . digoxin (LANOXIN) 0.125 MG tablet Take 125 mcg by mouth daily.        Marland Kitchen ezetimibe (ZETIA) 10 MG tablet Take 10 mg by mouth daily.      . furosemide (LASIX) 20 MG tablet TAKE 1 TABLET BY MOUTH EVERY DAY  30 tablet  6  . [START ON 01/13/2013] HYDROcodone-acetaminophen (NORCO) 7.5-325 MG per tablet Take 1 tablet by mouth 2 (two) times daily as needed for pain.  90 tablet  0  . LANCETS ULTRA THIN MISC       . metFORMIN (GLUCOPHAGE) 1000 MG tablet TAKE 1 TABLET BY MOUTH TWICE A DAY WITH A MEAL  60 tablet  2  . mometasone (NASONEX) 50 MCG/ACT nasal spray Place 2 sprays into the nose daily.  17 g  3  . Omega-3 Fatty Acids (FISH OIL) 1000 MG CAPS 2 caps by mouth daily       . omeprazole (PRILOSEC) 20 MG capsule Take 1 capsule (20 mg total) by mouth 2 (two) times daily.  60 capsule  5  . warfarin (COUMADIN) 5 MG tablet TAKE 1 TABLET (5 MG TOTAL) BY MOUTH DAILY. ADJUST DOSE DEPENDING ON RECOMMENDATIONS FROM INR CLINIC  60 tablet  5  . sertraline (ZOLOFT) 50 MG tablet Take 1 tablet (50 mg total) by mouth daily.  30 tablet  1   No current facility-administered medications for this visit.    Past Medical History  Diagnosis Date  . PAF (paroxysmal atrial fibrillation)   . HTN (hypertension)   . GERD (gastroesophageal reflux disease)   . Anxiety   . CAD (coronary artery disease)   . CHF (congestive heart failure)     EF 55% on 01/05/11, trace AR w/ Bioprosthetic valve  . Tobacco abuse   . DM2 (diabetes mellitus, type 2)   . Restless legs syndrome (RLS)   . HLD (hyperlipidemia)   . Anemia     Past Surgical History  Procedure Laterality Date  . Aortic valve replacement  08/25/2002    tissue valve  . Pacemaker insertion  04/02/2012    [employer]    Family History  Problem Relation Age of Onset  . Heart attack Brother   .  Kidney disease Sister     History   Social History  . Marital Status: Widowed    Spouse Name: N/A    Number of Children: N/A  . Years of Education: N/A   Occupational History  . Not on file.   Social History Main Topics  . Smoking status: Never Smoker   . Smokeless tobacco: Current User    Types: Snuff  . Alcohol Use: No  . Drug Use: No  . Sexual Activity: Not on file   Other Topics Concern  . Not on file   Social History Narrative  . No narrative on file    Review of systems: The patient specifically denies any chest pain at rest or with exertion, dyspnea at rest or with exertion, orthopnea, paroxysmal nocturnal dyspnea, syncope, palpitations, focal neurological deficits, intermittent claudication, unexplained weight gain, cough, hemoptysis or wheezing.  The patient also denies abdominal pain, nausea, vomiting, dysphagia, diarrhea, constipation, polyuria, polydipsia, dysuria, hematuria, frequency, urgency, abnormal bleeding or bruising, fever, chills, unexpected weight changes, mood swings, change in skin or hair texture, change in voice quality, auditory or visual problems, allergic reactions or rashes, new musculoskeletal complaints other than usual "aches and pains".   PHYSICAL EXAM BP 133/73  Pulse 72  Resp 20  Ht 5\' 4"  (1.626 m)  Wt 132 lb 9.6 oz (60.147 kg)  BMI 22.75 kg/m2  General: Alert, oriented x3, no distress Head: no evidence of trauma, PERRL, EOMI, no exophtalmos or lid lag, no myxedema, no xanthelasma; normal ears, nose and oropharynx Neck: normal jugular venous pulsations and no hepatojugular reflux; brisk carotid pulses without delay and no carotid bruits Chest: clear to auscultation, no signs of consolidation by percussion or palpation, normal fremitus, symmetrical and full respiratory excursions; healthy subclavian pacemaker site, healthy sternotomy scar Cardiovascular: normal position and quality of the apical impulse, regular rhythm, normal first  and second heart sounds, musical 2/6 early peaking systolic ejection murmur at the right upper sternal border radiating slightly towards the carotids, no rubs or gallops Abdomen: no tenderness or distention, no masses by palpation, no abnormal pulsatility or arterial bruits, normal bowel sounds, no hepatosplenomegaly Extremities: no clubbing, cyanosis or edema; 2+ radial, ulnar and brachial pulses bilaterally; 2+ right femoral, posterior tibial and dorsalis pedis pulses; 2+ left femoral, posterior tibial and dorsalis pedis pulses; no subclavian or femoral bruits Neurological: grossly nonfocal   EKG: Atrial paced ventricular sensed pole R wave in lead V2 inferior QS pattern, ST segment depression to inversion most obvious in leads I, aVL, II, aVF and V6. This has not really changed from previous tracing  Lipid Panel  Component Value Date/Time   CHOL 305* 04/06/2009 2033   TRIG 203* 04/06/2009 2033   HDL 45 04/06/2009 2033   CHOLHDL 6.8 Ratio 04/06/2009 2033   VLDL 41* 04/06/2009 2033   LDLCALC 219* 04/06/2009 2033    BMET    Component Value Date/Time   NA 138 12/03/2012 1204   K 4.6 12/03/2012 1204   CL 103 12/03/2012 1204   CO2 29 12/03/2012 1204   GLUCOSE 103* 12/03/2012 1204   BUN 16 12/03/2012 1204   CREATININE 1.08 12/03/2012 1204   CREATININE 1.01 11/13/2009 1538   CALCIUM 9.7 12/03/2012 1204   GFRNONAA 53* 11/13/2009 1538   GFRAA  Value: >60        The eGFR has been calculated using the MDRD equation. This calculation has not been validated in all clinical situations. eGFR's persistently <60 mL/min signify possible Chronic Kidney Disease. 11/13/2009 1538     ASSESSMENT AND PLAN Pacemaker Pacemaker check in clinic. Normal device function. Thresholds, sensing, impedances consistent with previous measurements. Device programmed to maximize longevity. 17 mode switches---max dur. 16 sec, Max A 208, Max V 91---PACs/AFL + Warfarin for hx of  sustained episodes. No high ventricular rates noted.  Device programmed at appropriate safety margins. RV out was reprogrammed to a permanent value of 3.0 @ 1.21ms. Histogram distribution appropriate for patient activity level. Device programmed to  optimize intrinsic conduction. Estimated longevity 7->9 years  Atrial fibrillation Very low burden of arrhythmia but keep on warfarin indefinitely barring any bleeding complications. She never has had ventricular rates and I think at her age the digoxin is more likely to be a liability than benefit. I recommend that she stop this medication. Continue beta blocker for rate control.  HYPERLIPIDEMIA I am not sure that her cholesterol has been rechecked since starting the zetia in addition to Crestor. We'll check with her primary care physician's office.  HYPERTENSION, BENIGN Good control   Patient Instructions  Remote monitoring is used to monitor your pacemaker from home. This monitoring reduces the number of office visits required to check your device to one time per year. It allows [place] to keep an eye on the functioning of your device to ensure it is working properly. You are scheduled for a device check from home on 04-02-2013. You may send your transmission at any time that day. If you have a wireless device, the transmission will be sent automatically. After your physician reviews your transmission, you will receive a postcard with your next transmission date.  Your physician has recommended you make the following change in your medication: [place] physician recommends that you schedule a follow-up appointment in: 6 months.       Orders Placed This Encounter  Procedures  . Pacemaker Device Observation  . EKG 12-Lead   No orders of the defined types were placed in this encounter.    Holli Humbles, MD, [facility] [phone] office [phone] pager

## 2013-01-08 NOTE — Assessment & Plan Note (Signed)
Pacemaker check in clinic. Normal device function. Thresholds, sensing, impedances consistent with previous measurements. Device programmed to maximize longevity. 17 mode switches---max dur. 16 sec, Max A 208, Max V 91---PACs/AFL + Warfarin for hx of  sustained episodes. No high ventricular rates noted. Device programmed at appropriate safety margins. RV out was reprogrammed to a permanent value of 3.0 @ 1.10ms. Histogram distribution appropriate for patient activity level. Device programmed to  optimize intrinsic conduction. Estimated longevity 7->9 years

## 2013-01-08 NOTE — Assessment & Plan Note (Signed)
I am not sure that her cholesterol has been rechecked since starting the zetia in addition to Crestor. We'll check with her primary care physician's office.

## 2013-01-08 NOTE — Assessment & Plan Note (Signed)
Good control

## 2013-01-08 NOTE — Assessment & Plan Note (Signed)
Very low burden of arrhythmia but keep on warfarin indefinitely barring any bleeding complications. She never has had ventricular rates and I think at her age the digoxin is more likely to be a liability than benefit. I recommend that she stop this medication. Continue beta blocker for rate control.

## 2013-01-29 ENCOUNTER — Ambulatory Visit (INDEPENDENT_AMBULATORY_CARE_PROVIDER_SITE_OTHER): Payer: Self-pay | Admitting: Pharmacist Clinician (PhC)/ Clinical Pharmacy Specialist

## 2013-01-29 DIAGNOSIS — I4891 Unspecified atrial fibrillation: Secondary | ICD-10-CM

## 2013-01-29 DIAGNOSIS — Z7901 Long term (current) use of anticoagulants: Secondary | ICD-10-CM

## 2013-02-05 ENCOUNTER — Other Ambulatory Visit: Payer: Self-pay | Admitting: Pharmacist Clinician (PhC)/ Clinical Pharmacy Specialist

## 2013-03-05 LAB — PROTIME-INR: INR: 2.8 — AB (ref <=1.1)

## 2013-03-07 ENCOUNTER — Ambulatory Visit (INDEPENDENT_AMBULATORY_CARE_PROVIDER_SITE_OTHER): Payer: PRIVATE HEALTH INSURANCE | Admitting: Pharmacist Clinician (PhC)/ Clinical Pharmacy Specialist

## 2013-03-07 DIAGNOSIS — Z7901 Long term (current) use of anticoagulants: Secondary | ICD-10-CM

## 2013-03-07 DIAGNOSIS — I4891 Unspecified atrial fibrillation: Secondary | ICD-10-CM

## 2013-03-08 ENCOUNTER — Other Ambulatory Visit: Payer: Self-pay | Admitting: Family Medicine

## 2013-03-10 NOTE — Telephone Encounter (Signed)
Will fwd to MD.  [name], CMA  

## 2013-03-13 ENCOUNTER — Encounter: Payer: Self-pay | Admitting: Family Medicine

## 2013-03-13 ENCOUNTER — Ambulatory Visit (INDEPENDENT_AMBULATORY_CARE_PROVIDER_SITE_OTHER): Payer: PRIVATE HEALTH INSURANCE | Admitting: Family Medicine

## 2013-03-13 VITALS — BP 151/74 | HR 71 | Temp 98.1°F | Ht 64.0 in | Wt 138.0 lb

## 2013-03-13 DIAGNOSIS — E119 Type 2 diabetes mellitus without complications: Secondary | ICD-10-CM

## 2013-03-13 DIAGNOSIS — M899 Disorder of bone, unspecified: Secondary | ICD-10-CM

## 2013-03-13 DIAGNOSIS — M949 Disorder of cartilage, unspecified: Secondary | ICD-10-CM

## 2013-03-13 DIAGNOSIS — L258 Unspecified contact dermatitis due to other agents: Secondary | ICD-10-CM

## 2013-03-13 DIAGNOSIS — G4700 Insomnia, unspecified: Secondary | ICD-10-CM

## 2013-03-13 DIAGNOSIS — L853 Xerosis cutis: Secondary | ICD-10-CM

## 2013-03-13 DIAGNOSIS — I10 Essential (primary) hypertension: Secondary | ICD-10-CM

## 2013-03-13 DIAGNOSIS — K219 Gastro-esophageal reflux disease without esophagitis: Secondary | ICD-10-CM

## 2013-03-13 DIAGNOSIS — E785 Hyperlipidemia, unspecified: Secondary | ICD-10-CM

## 2013-03-13 LAB — POCT GLYCOSYLATED HEMOGLOBIN (HGB A1C): HEMOGLOBIN A1C: 6.6

## 2013-03-13 MED ORDER — TRAZODONE HCL 50 MG PO TABS: 25.0000 mg | ORAL_TABLET | Freq: Every evening | ORAL | Status: DC | PRN

## 2013-03-13 MED ORDER — ALPRAZOLAM 0.5 MG PO TABS: ORAL_TABLET | ORAL | Status: DC

## 2013-03-13 MED ORDER — HYDROCODONE-ACETAMINOPHEN 7.5-325 MG PO TABS: 1.0000 | ORAL_TABLET | Freq: Two times a day (BID) | ORAL | Status: DC | PRN

## 2013-03-13 MED ORDER — PANTOPRAZOLE SODIUM 40 MG PO TBEC: 40.0000 mg | DELAYED_RELEASE_TABLET | Freq: Every day | ORAL | Status: DC

## 2013-03-13 NOTE — Assessment & Plan Note (Signed)
Chronic dry skin, recent worsening over winter.  Plan: 1. Recommend OTC Benadryl Anti-Itch cream mixed with Eucerin for moisture 2. May mix in low dose OTC topical hydrocortisone for further relief 3. Avoid benadryl PO

## 2013-03-13 NOTE — Assessment & Plan Note (Signed)
Insomnia in elderly patient, no overt evidence of depression. Clinical reasons for sleep disturbance including arthritis pain / dry skin itching. Also very poor sleep hygiene.  Plan: 1. Start Trazodone 25 mg (half tab) nightly, may increase to 50mg  (1 tab) nightly if needed 2. Discussed sleep hygiene in detail - goals no TV 44min before bed, TV off in bedroom, inc activity during day, decrease caffeine intake and none after 1pm 3. If no improvement RTC, explore alternative etiologies, further investigate depression. May titrate Trazodone up to 75-100mg  nightly if tolerated. 4. Avoid Ambien, BDZs, anti-cholinergics

## 2013-03-13 NOTE — Progress Notes (Signed)
Subjective:     Patient ID: [name], female   DOB: Feb 20, 1930, 80 y.o.   MRN: [id number]  History provided by patient and daughter.  HPI  INSOMNIA: History of insomnia within past 1-2 years, previously treated with Ambien 5mg  about 4-5 months ago, but unable to tolerate d/t "vivid dreams / nightmares". Recently has had increased difficulty falling and staying asleep, often wake up 4-5 am and not feel rested. - admits poor sleep hygiene (watches TV before bed, leaves TV on in bedroom while attempting sleep, drinks tea / pepsi in afternoon and evening)  DRY SKIN / ITCHING: Chronic history of dry skin with itching, recently worse during winter, recently has woken up scratching. Continues to use normal lotion (unable to recall type or if medicated). Has tried new [employer] product within past few weeks and felt like it was "burning", since discontinued this. - reports h/o skin cancer, multiple locations, followed by Dermatology (plans to see soon)  [place] / H/o GERD: Reports "churning and moving" of stomach occasionally, started back in November when she was advised by Cardiology to DC Digoxin, stopped x 1 week and began to feel these symptoms, so she resumed Digoxin on her own. Symptoms have persisted, less frequently. No change or interference with eating, bowel habits (reports intermittent constipation vs soft stools every 3-4 days). - Never received colonoscopy. Adamantly denies plans to undergo colonoscopy  CHRONIC DM, Type 2: Last HgbA1c 6.0 (12/03/12). Reports no concerns Meds: Metformin 1000mg  BID Reports good compliance. Tolerating well w/o side-effects Currently on ACEi Complications: none  Lifestyle: Diet (admits to inc sweets / portions, eats "whatever I want") Exercise (stays active, no formal exercise) Denies hypoglycemia, polyuria, visual changes, numbness or tingling.  CHRONIC HTN: Mildly elevated BP today, 151/74 (relatively stable). Reports - one  episode of feeling weak with BP measured in high 80s, sat down and felt better Current Meds - Amlodipine-Benazepril 10-20mg  daily, Atenolol 75mg  daily (1.5x daily 50mg  tabs), Furosemide 20mg  daily   Reports good compliance, took meds today. Tolerating well, w/o complaints. Denies CP, dyspnea, claudication, HA, edema, dizziness / lightheadedness  Social Hx: Never smoker, uses smokeless tobacco  PMH: Followed by Cardiology (on chronic anticoagulation with Warfarin) s/p Aortic valve replacement, pacemaker (x2, replaced 02/2012)  Review of Systems  See above HPI.     Objective:   Physical Exam  BP 151/74  Pulse 71  Temp(Src) 98.1 F (36.7 C) (Oral)  Ht 5\' 4"  (1.626 m)  Wt 138 lb (62.596 kg)  BMI 23.68 kg/m2  General - very pleasant, well-appearing 79 yr female, NAD HEENT - PERRL, EOMI, oropharynx clear, MMM Neck - soft, non-tender  Heart - RRR, +2/6 SEM +R-sternal border / 2nd ICS Lungs - CTAB, no wheezing, rhonchi, or crackles heard. Normal work of breathing Abd - soft, NTND, no masses, +active BS Ext - mild +1 non-pitting edema bilateral LE, non-tender, +2 peripheral pulses  Skin - significant dry flaking, diffusely thick / scarred and pink tissue bilateral arms, extensive aging and sun-damage Neuro - grossly non-focal, muscle str intact 5/5 all ext, distal sensation to light touch intact, gait slow     Assessment:     See specific A&P problem list for details.      Plan:     See specific A&P problem list for details.

## 2013-03-13 NOTE — Assessment & Plan Note (Signed)
Recent decline in DM2 control Current HgbA1c 6.6 (03/13/13), last HgbA1c 6.0 (12/03/12). Meds: Metformin 1000mg  BID No complications or hypoglycemia. HM: Eye Exam (performed retinal scan 12/03/12), Foot Exam (performed 12/03/12)  Plan:  1. Continue current therapy. 2. Lifestyle Mods - Emphasized improvement in DM diet (decrease sweets / sodas) / continued activity and regular exercise

## 2013-03-13 NOTE — Assessment & Plan Note (Signed)
Well-controlled HTN, BP today 151/74 (at goal for age 80)  No complications - concern for infrequent episodes of hypotension BP 80-100s with noted weakness.   Plan:  1. Continue current BP meds. Consider titrating down on some BP meds in near future 2. Monitor BP at home or at drug store occasionally.

## 2013-03-13 NOTE — Assessment & Plan Note (Signed)
Consider repeat DEXA scan. No h/o recent fracture. Patient considered fall risk given age, co-morbidities, and polypharmacy. If needed may initiate Calcium (Cal-citrate) / Vit D supplementation.

## 2013-03-13 NOTE — Assessment & Plan Note (Signed)
Continue Zetia and Crestor 10mg . Consider increasing Crestor to 20mg  daily for moderate intensity, however h/o myalgia intolerance to Pravastatin. Discuss at next apt.

## 2013-03-13 NOTE — Patient Instructions (Signed)
Dear [name], Thank you for coming in to clinic today. It was good to see you again!  Today we discussed your Insomnia, Itching, Stomach, and Diabetes. 1. For your insomnia, I have sent a new prescription for Trazodone to help you sleep, also please follow the recommendations about reducing TV time around bedtime and increasing activity during the day, also no caffeine after 1pm. 2. For your itching, I recommend using over the counter Benadryl Anti-itch Cream, you may mix this with Eucerin. And if it is not helpful, you can get a topica steroid to mix with this as well. 3. For your stomach, I have sent a new prescription for Protonix (take this once daily with breakfast) 4. For your Diabetes, it looks like your sugar has increased since we last met, A1c 6.0 to 6.6, please work on decreasing sugary foods and increasing your exercise.  We started a new medication today to help your Sleep. Trazodone 50 mg, please take half to 1 whole tablet at bedtime. (Start with half a tab, 25mg, for the first few nights, if needed you may take a whole tablet)  Some important numbers from today's visit: HgA1C - 6.6 BP - 151/74  Please schedule a follow-up appointment with me in 3 months for Diabetes follow-up.  If you have any other questions or concerns, please feel free to call the clinic to contact me. You may also schedule an earlier appointment if necessary.  However, if your symptoms get significantly worse, please go to the Emergency Department to seek immediate medical attention.  Alexander Karamalegos, DO [facility]    

## 2013-03-13 NOTE — Assessment & Plan Note (Signed)
Unclear etiology of stomach churning / irritation. No significant function concerns, no bowel changes (no blood in stool), no pain or association with eating.  Plan: 1. Change PPI - discontinue Omeprazole 20mg  BID. Start Protonix 40mg  daily

## 2013-03-31 ENCOUNTER — Other Ambulatory Visit: Payer: Self-pay | Admitting: Family Medicine

## 2013-04-02 ENCOUNTER — Encounter: Payer: PRIVATE HEALTH INSURANCE | Admitting: *Deleted

## 2013-04-07 ENCOUNTER — Other Ambulatory Visit: Payer: Self-pay | Admitting: *Deleted

## 2013-04-07 ENCOUNTER — Other Ambulatory Visit: Payer: Self-pay | Admitting: Cardiovascular Disease

## 2013-04-08 MED ORDER — METFORMIN HCL 1000 MG PO TABS: ORAL_TABLET | ORAL | Status: DC

## 2013-04-08 NOTE — Telephone Encounter (Signed)
Reviewed chart. Refilled Metformin.  [name], [place], PGY-1

## 2013-04-09 ENCOUNTER — Other Ambulatory Visit: Payer: Self-pay | Admitting: *Deleted

## 2013-04-09 MED ORDER — ROSUVASTATIN CALCIUM 10 MG PO TABS: 10.0000 mg | ORAL_TABLET | Freq: Every day | ORAL | Status: DC

## 2013-04-09 NOTE — Telephone Encounter (Signed)
Rx was sent to pharmacy electronically. 

## 2013-04-10 ENCOUNTER — Other Ambulatory Visit: Payer: Self-pay | Admitting: *Deleted

## 2013-04-10 DIAGNOSIS — G4700 Insomnia, unspecified: Secondary | ICD-10-CM

## 2013-04-10 DIAGNOSIS — K219 Gastro-esophageal reflux disease without esophagitis: Secondary | ICD-10-CM

## 2013-04-10 LAB — POCT INR: INR: 5.6

## 2013-04-11 ENCOUNTER — Telehealth: Payer: Self-pay | Admitting: *Deleted

## 2013-04-11 ENCOUNTER — Other Ambulatory Visit: Payer: Self-pay | Admitting: *Deleted

## 2013-04-11 ENCOUNTER — Ambulatory Visit (INDEPENDENT_AMBULATORY_CARE_PROVIDER_SITE_OTHER): Payer: PRIVATE HEALTH INSURANCE | Admitting: Pharmacist Clinician (PhC)/ Clinical Pharmacy Specialist

## 2013-04-11 ENCOUNTER — Other Ambulatory Visit: Payer: Self-pay | Admitting: Pharmacist Clinician (PhC)/ Clinical Pharmacy Specialist

## 2013-04-11 DIAGNOSIS — Z7901 Long term (current) use of anticoagulants: Secondary | ICD-10-CM

## 2013-04-11 DIAGNOSIS — I4891 Unspecified atrial fibrillation: Secondary | ICD-10-CM

## 2013-04-11 MED ORDER — WARFARIN SODIUM 5 MG PO TABS
ORAL_TABLET | ORAL | Status: DC
Start: 2013-04-11 — End: 2014-05-01

## 2013-04-11 MED ORDER — ROSUVASTATIN CALCIUM 10 MG PO TABS: 10.0000 mg | ORAL_TABLET | Freq: Every day | ORAL | Status: DC

## 2013-04-11 NOTE — Telephone Encounter (Signed)
Request for authorization to dispense 90 day supply for the following medication placed in PCP box for review:  1. Nasonex 50 Mcg Nasal spray 2. Furosemide 20 mg tab 3. Trazodone 50 mg tab 4.  Amlodipine-benazepril 10-20 mg 5.  Pantoprazole SOD DR 40 mg tab 6.  Atenolol 50 mg tab  Derl Barrow, RN

## 2013-04-11 NOTE — Telephone Encounter (Signed)
Called [facility], confirmed that patient currently has active prescriptions with refills remaining for all of these medications. They stated that it was a request to change over to [facility].  Called patient and discussed rx with her daughter [name], who stated that they were called by [facility] and asked to change to 90 day supply, but they would actually not prefer to do this, as it causes confusion for [name], as she is used to receiving pill bottles with 30 day supply. They advised to cancel the refill requests, and will contact our clinic or pharmacy when need actual refill.  [name], [place], PGY-1

## 2013-04-12 ENCOUNTER — Other Ambulatory Visit: Payer: Self-pay | Admitting: Cardiovascular Disease

## 2013-04-13 NOTE — Telephone Encounter (Signed)
Called patient on 04/11/13, discussed refill request with daughter [name]) and she stated that [facility] had called to attempt to get them to change to 90 day supply, however she would much rather prefer to keep 30 day supply of meds, as her mother [name]) easily gets confused with the 90 day pill bottles. She agreed with refusing current refill requests for 90 day supply. Denies any immediate need for refills, has plenty of her medicines at the present time.  [facility] to confirm patient still has refills available for meds. No changes at this time.  [name], [place], PGY-1

## 2013-04-16 ENCOUNTER — Other Ambulatory Visit: Payer: Self-pay | Admitting: *Deleted

## 2013-04-16 ENCOUNTER — Encounter: Payer: Self-pay | Admitting: *Deleted

## 2013-04-16 MED ORDER — ROSUVASTATIN CALCIUM 10 MG PO TABS: 10.0000 mg | ORAL_TABLET | Freq: Every day | ORAL | Status: DC

## 2013-04-16 NOTE — Telephone Encounter (Signed)
Rx was sent to pharmacy electronically. 

## 2013-04-18 ENCOUNTER — Telehealth: Payer: Self-pay | Admitting: Family Medicine

## 2013-04-18 NOTE — Telephone Encounter (Signed)
Reviewed patient's chart. Called [facility]. Discussed with pharmacist, who informed me that they have the printed rx for Norco 7.5-325 take 1 tab BID PRN pain (#60, 0 refills) currently on hold for patient with prior instructions "Do not fill before 04/27/13". Provided verbal approval to fill rx, and pharmacy agrees to fill rx early and notify patient when rx will be ready for pick-up.  [name], [place], PGY-1

## 2013-04-18 NOTE — Telephone Encounter (Signed)
Will fwd. To PCP for review .[name]  

## 2013-04-18 NOTE — Telephone Encounter (Signed)
Daughter, [name] calling about Mrs. Wieseler's rx for her hydrocodone.   Rx was written for start time in March.  Patient's las refill was in January.. Need refill for now.  Please contact daughter to discuss correction and dispense of the other rx.

## 2013-04-22 LAB — PROTIME-INR: INR: 2.3 — AB (ref <=1.1)

## 2013-04-23 ENCOUNTER — Ambulatory Visit: Payer: PRIVATE HEALTH INSURANCE | Admitting: Pharmacist Clinician (PhC)/ Clinical Pharmacy Specialist

## 2013-04-23 DIAGNOSIS — Z7901 Long term (current) use of anticoagulants: Secondary | ICD-10-CM

## 2013-04-23 DIAGNOSIS — I4891 Unspecified atrial fibrillation: Secondary | ICD-10-CM

## 2013-05-22 ENCOUNTER — Ambulatory Visit (INDEPENDENT_AMBULATORY_CARE_PROVIDER_SITE_OTHER): Payer: PRIVATE HEALTH INSURANCE | Admitting: Family Medicine

## 2013-05-22 ENCOUNTER — Encounter: Payer: Self-pay | Admitting: Family Medicine

## 2013-05-22 VITALS — BP 133/54 | HR 82 | Temp 98.1°F | Ht 64.0 in | Wt 136.5 lb

## 2013-05-22 DIAGNOSIS — K219 Gastro-esophageal reflux disease without esophagitis: Secondary | ICD-10-CM

## 2013-05-22 DIAGNOSIS — I10 Essential (primary) hypertension: Secondary | ICD-10-CM

## 2013-05-22 DIAGNOSIS — G4700 Insomnia, unspecified: Secondary | ICD-10-CM

## 2013-05-22 DIAGNOSIS — E119 Type 2 diabetes mellitus without complications: Secondary | ICD-10-CM

## 2013-05-22 DIAGNOSIS — G894 Chronic pain syndrome: Secondary | ICD-10-CM

## 2013-05-22 DIAGNOSIS — I509 Heart failure, unspecified: Secondary | ICD-10-CM

## 2013-05-22 DIAGNOSIS — F341 Dysthymic disorder: Secondary | ICD-10-CM

## 2013-05-22 LAB — POCT GLYCOSYLATED HEMOGLOBIN (HGB A1C): Hemoglobin A1C: 6.3

## 2013-05-22 LAB — IRON: Iron: 39 ug/dL — ABNORMAL LOW (ref 42–145)

## 2013-05-22 MED ORDER — ALPRAZOLAM 0.5 MG PO TABS
ORAL_TABLET | ORAL | Status: DC
Start: 2013-05-22 — End: 2013-08-06

## 2013-05-22 MED ORDER — HYDROCODONE-ACETAMINOPHEN 7.5-325 MG PO TABS: 1.0000 | ORAL_TABLET | Freq: Two times a day (BID) | ORAL | Status: DC | PRN

## 2013-05-22 MED ORDER — BENAZEPRIL HCL 20 MG PO TABS: 20.0000 mg | ORAL_TABLET | Freq: Every day | ORAL | Status: DC

## 2013-05-22 MED ORDER — OMEPRAZOLE 20 MG PO CPDR: 20.0000 mg | DELAYED_RELEASE_CAPSULE | Freq: Every day | ORAL | Status: DC

## 2013-05-22 NOTE — Assessment & Plan Note (Addendum)
Controlled on Norco  Plan: 1. Refilled Norco 7.5-325mg  1-2 tabs daily (#60, 0 refills) - printed rx x 3 fill dates (now, 06/11/13, 07/11/13)

## 2013-05-22 NOTE — Assessment & Plan Note (Signed)
Controlled on Xanax. No longer on Zoloft x 1 year  Plan: 1. Refilled Xanx 0.5mg  tabs x 2 daily (#60, 3 refills)

## 2013-05-22 NOTE — Assessment & Plan Note (Addendum)
Well-controlled HTN Low normal with concern for inc risk of infrequent symptomatic hypotension at home Suspect some symptoms with weakness may be attributed to AS   Plan:  1. Discontinue Amlodipine-Benazepril 10-20mg  daily 2. Start Benazepril 20mg  daily 3. Continue monitoring home BP 4. RTC if concerning symptomatic low BP

## 2013-05-22 NOTE — Assessment & Plan Note (Signed)
Improved DM2 control Current HgbA1c 6.3 (05/22/13) - last 6.6 ([id number]) No complications or hypoglycemia  Plan: 1. Continue Metformin 1000mg  BID 2. Set goal for inc walking 15-22mins in neighborhood 3x weekly

## 2013-05-22 NOTE — Patient Instructions (Addendum)
Dear [name], Thank you for coming in to clinic today. It was good to see you again!  Today we discussed your Diabetes, Blood Pressure, and Medications 1. For your blood pressure, I am concerned that you've had some low blood pressures recently and felt weak. I have made the following changes to your BP meds:     - Discontinued Amlodipine-Benazepril 10-20mg  (previously took 1 daily)     - Start Benazepril 20mg  - take 1 daily (90 pills in this bottle) 2. For your Diabetes, your A1c today was 6.3 (improved from before 6.6) - Keep up the good work 3. Remember your goal to walk 3 times a week! We will talk more about this at our next visit 4. Acid Reflux medicine - I changed your prescription to Prilosec 5. For your insomnia, keep taking the Trazodone (half tablet, may go up to 1 whole tablet if needed) 6. Printed refills for 3 months of your Pain Pill and the Xanax  Some important numbers from today's visit: HgA1C - 6.3 (last 6.6 in January) BP - 133/54  We will check your iron level today, and we can discuss these results at your next visit.  Please schedule a follow-up appointment with me in 3 months for Diabetes follow-up.  If you have any other questions or concerns, please feel free to call the clinic to contact me. You may also schedule an earlier appointment if necessary.  However, if your symptoms get significantly worse, please go to the Emergency Department to seek immediate medical attention.  [name], [place]

## 2013-05-22 NOTE — Assessment & Plan Note (Signed)
Controlled on Trazodone.  Plan: 1. Continue Trazodone 25-50mg  (take half tab to 1 tab nightly PRN insomnia)

## 2013-05-22 NOTE — Assessment & Plan Note (Signed)
Check iron and ferritin given chronic CHF for evaluation of potential anemia

## 2013-05-22 NOTE — Progress Notes (Signed)
Subjective:     Patient ID: [name], female   DOB: 1929/10/27, 80 y.o.   MRN: [id number]  Patient presents   HPI  CHRONIC DM, Type 2: Last HgbA1c 6.6 (03/13/13) Reports no concerns - CBG average 90-130s, High >200, Low 84 (asymptomatic), check 2x daily (AM and PM) Meds: Metformin 1000mg  BID Reports good compliance. Tolerating well w/o side-effects Currently on ACEi Complications: none  Lifestyle: Diet ("whatever I want" Exercise (stays active, no formal exercise) - Agreeable to goal of walking 3x weekly around neighborhood Denies hypoglycemia, polyuria, visual changes, numbness or tingling.  INSOMNIA: - Last OV 03/13/13 started Trazodone 50mg  (take half to 1 tab) - Reports taking half tab (25mg  total) nightly with good results and improved sleep - No concerns today  CHRONIC HTN: Stable BP today 133/54 - reports frequently checks BP at home daily, occasionally low SBP 100s, feels weak - Reports compliance with all BP meds - Denies syncope, lightheadedness / dizziness, vertigo, vision changes, CP, SOB  CHRONIC LBP: - Chronic stable LBP controlled on Norco 7.5-325 takes 1-2 times total daily, controls pain and improves function - No recent change or injury - Denies significant numbness / tingling, saddle anesthesia, loss of bowel / bladder control  CHRONIC ANXIETY: - Controlled on daily Xanax 0.5mg , takes 2 tabs daily  I have reviewed and updated the following as appropriate: allergies and current medications  Social Hx: Never smoker, uses smokeless tobacco   PMH: Followed by Cardiology (on chronic anticoagulation with Warfarin) s/p Aortic valve replacement, pacemaker (x2, replaced 02/2012)   Review of Systems  See above HPI.     Objective:   Physical Exam  BP 133/54  Pulse 82  Temp(Src) 98.1 F (36.7 C) (Oral)  Ht 5\' 4"  (1.626 m)  Wt 136 lb 8 oz (61.916 kg)  BMI 23.42 kg/m2  General - very pleasant, well-appearing, NAD  HEENT - MMM  Neck - soft,  non-tender  Heart - RRR, +2/6 SEM +R-sternal border / 2nd ICS with radiation to carotids bilaterally Lungs - CTAB, no wheezing, rhonchi, or crackles heard. Normal work of breathing  Abd - soft, NTND, no masses, +active BS  Ext - mild +1 pitting edema bilateral LE, non-tender, +2 peripheral pulses  Neuro - grossly non-focal, muscle str intact 5/5 all ext, distal sensation to light touch intact, gait slow     Assessment:     See specific A&P problem list for details.      Plan:     See specific A&P problem list for details.

## 2013-05-23 ENCOUNTER — Encounter: Payer: Self-pay | Admitting: Family Medicine

## 2013-05-23 LAB — FERRITIN: FERRITIN: 13 ng/mL (ref 10–291)

## 2013-05-28 ENCOUNTER — Telehealth: Payer: Self-pay | Admitting: Family Medicine

## 2013-05-28 NOTE — Telephone Encounter (Signed)
Spoke with pharmacy and there is no problem.  Rx ready for patient to pick up.  LMVM for [name] with this message.  Desiree Fleming, [place]oralyn [place], [place]

## 2013-05-28 NOTE — Telephone Encounter (Signed)
[facility] Dr Neoma Laming is not authorized to fill prescription [facility] [place]  [phone] Please call when it is straightened out

## 2013-05-29 LAB — PROTIME-INR: INR: 4.6 — AB (ref 0.9–1.1)

## 2013-05-29 LAB — POCT INR: INR: 4.6

## 2013-05-30 ENCOUNTER — Ambulatory Visit (INDEPENDENT_AMBULATORY_CARE_PROVIDER_SITE_OTHER): Payer: PRIVATE HEALTH INSURANCE | Admitting: Pharmacist Clinician (PhC)/ Clinical Pharmacy Specialist

## 2013-05-30 DIAGNOSIS — I4891 Unspecified atrial fibrillation: Secondary | ICD-10-CM

## 2013-05-30 DIAGNOSIS — Z7901 Long term (current) use of anticoagulants: Secondary | ICD-10-CM

## 2013-06-03 ENCOUNTER — Telehealth: Payer: Self-pay | Admitting: Family Medicine

## 2013-06-03 NOTE — Telephone Encounter (Signed)
Will fwd to clarify. Thanks. [name]

## 2013-06-03 NOTE — Telephone Encounter (Signed)
Discussed the possibility of patient seeing Lamont Dowdy for Medicare Adult Wellness Visit. She would need to schedule this visit prior to 06/16/13.  If you could, schedule the patient for this and then share the appointment with her. Thank you  [name], [place], PGY-1

## 2013-06-03 NOTE — Telephone Encounter (Signed)
Daughter called because she said the Dr. Parks Ranger told her to make an appointment with the medicare doctor. I am not sure thought maybe it could be Dr. Valentina Lucks or Vinnie Level. Please advise. jw

## 2013-06-05 ENCOUNTER — Other Ambulatory Visit: Payer: Self-pay | Admitting: Family Medicine

## 2013-06-09 ENCOUNTER — Other Ambulatory Visit: Payer: Self-pay | Admitting: Family Medicine

## 2013-06-16 LAB — PROTIME-INR: INR: 1.7 — AB (ref 0.9–1.1)

## 2013-06-18 ENCOUNTER — Ambulatory Visit (INDEPENDENT_AMBULATORY_CARE_PROVIDER_SITE_OTHER): Payer: PRIVATE HEALTH INSURANCE | Admitting: Pharmacist Clinician (PhC)/ Clinical Pharmacy Specialist

## 2013-06-18 DIAGNOSIS — I4891 Unspecified atrial fibrillation: Secondary | ICD-10-CM

## 2013-06-18 DIAGNOSIS — Z7901 Long term (current) use of anticoagulants: Secondary | ICD-10-CM

## 2013-06-21 ENCOUNTER — Other Ambulatory Visit: Payer: Self-pay | Admitting: Family Medicine

## 2013-07-03 LAB — PROTIME-INR: INR: 1.8 — AB (ref 0.9–1.1)

## 2013-07-06 ENCOUNTER — Other Ambulatory Visit: Payer: Self-pay | Admitting: Family Medicine

## 2013-07-07 ENCOUNTER — Ambulatory Visit (INDEPENDENT_AMBULATORY_CARE_PROVIDER_SITE_OTHER): Payer: PRIVATE HEALTH INSURANCE | Admitting: Pharmacist Clinician (PhC)/ Clinical Pharmacy Specialist

## 2013-07-07 DIAGNOSIS — Z7901 Long term (current) use of anticoagulants: Secondary | ICD-10-CM

## 2013-07-07 DIAGNOSIS — I4891 Unspecified atrial fibrillation: Secondary | ICD-10-CM

## 2013-07-07 NOTE — Telephone Encounter (Signed)
Will fwd to MD.. Lazaro Arms, [place]

## 2013-07-08 ENCOUNTER — Other Ambulatory Visit: Payer: Self-pay | Admitting: Family Medicine

## 2013-07-08 DIAGNOSIS — G4700 Insomnia, unspecified: Secondary | ICD-10-CM

## 2013-07-18 ENCOUNTER — Ambulatory Visit: Payer: PRIVATE HEALTH INSURANCE | Admitting: Cardiovascular Disease

## 2013-07-23 LAB — PROTIME-INR: INR: 2.4 — AB (ref <=1.1)

## 2013-07-25 ENCOUNTER — Ambulatory Visit (INDEPENDENT_AMBULATORY_CARE_PROVIDER_SITE_OTHER): Payer: PRIVATE HEALTH INSURANCE | Admitting: Pharmacist Clinician (PhC)/ Clinical Pharmacy Specialist

## 2013-07-25 DIAGNOSIS — I4891 Unspecified atrial fibrillation: Secondary | ICD-10-CM

## 2013-07-25 DIAGNOSIS — Z7901 Long term (current) use of anticoagulants: Secondary | ICD-10-CM

## 2013-08-05 ENCOUNTER — Other Ambulatory Visit: Payer: Self-pay | Admitting: Pharmacist Clinician (PhC)/ Clinical Pharmacy Specialist

## 2013-08-05 ENCOUNTER — Other Ambulatory Visit: Payer: Self-pay | Admitting: Family Medicine

## 2013-08-06 ENCOUNTER — Encounter: Payer: Self-pay | Admitting: Family Medicine

## 2013-08-06 ENCOUNTER — Ambulatory Visit (INDEPENDENT_AMBULATORY_CARE_PROVIDER_SITE_OTHER): Payer: PRIVATE HEALTH INSURANCE | Admitting: Family Medicine

## 2013-08-06 VITALS — BP 115/48 | HR 70 | Temp 98.3°F | Wt 134.0 lb

## 2013-08-06 DIAGNOSIS — G894 Chronic pain syndrome: Secondary | ICD-10-CM

## 2013-08-06 DIAGNOSIS — I10 Essential (primary) hypertension: Secondary | ICD-10-CM

## 2013-08-06 DIAGNOSIS — E119 Type 2 diabetes mellitus without complications: Secondary | ICD-10-CM

## 2013-08-06 DIAGNOSIS — J309 Allergic rhinitis, unspecified: Secondary | ICD-10-CM

## 2013-08-06 DIAGNOSIS — F341 Dysthymic disorder: Secondary | ICD-10-CM

## 2013-08-06 LAB — POCT GLYCOSYLATED HEMOGLOBIN (HGB A1C): Hemoglobin A1C: 6.4

## 2013-08-06 MED ORDER — HYDROCODONE-ACETAMINOPHEN 7.5-325 MG PO TABS: 1.0000 | ORAL_TABLET | Freq: Two times a day (BID) | ORAL | Status: DC | PRN

## 2013-08-06 MED ORDER — ALPRAZOLAM 0.5 MG PO TABS: ORAL_TABLET | ORAL | Status: DC

## 2013-08-06 MED ORDER — FLUTICASONE PROPIONATE 50 MCG/ACT NA SUSP: 2.0000 | Freq: Every day | NASAL | Status: DC

## 2013-08-06 MED ORDER — LORATADINE 10 MG PO TABS: 10.0000 mg | ORAL_TABLET | Freq: Every day | ORAL | Status: DC

## 2013-08-06 NOTE — Patient Instructions (Addendum)
Dear [name], Thank you for coming in to clinic today. It was good to see you again!  Today we discussed your Diabetes, Blood Pressure, and Medications 1. Resume previous blood pressure medication, take once daily. Please check your blood pressure to monitor this, if it is too low < 120 / 80, then do not take this medication. 2. Your Diabetes is doing well. A1c 6.4, keep up the good work! 3. I sent prescription for Loratadine (Claritin) to your pharmacy, please pick it up, take 1 daily, also sent Flonase nasal spray, use 1 spray in each nostril 1-2 times daily for a few weeks. 4. Recommend over the counter - "Anti-Histamine Eye drops" only use as needed, not every day. Ask pharmacist for help if you can't find the right ones.  Some important numbers from today's visit: HgA1C - 6.4 BP - 115/48  Please schedule a follow-up appointment with me in 1 months for Blood Pressure follow-up.  If you have any other questions or concerns, please feel free to call the clinic to contact me. You may also schedule an earlier appointment if necessary.  However, if your symptoms get significantly worse, please go to the Emergency Department to seek immediate medical attention.  [name], [place]

## 2013-08-06 NOTE — Assessment & Plan Note (Addendum)
Controlled on Norco  Plan: 1. Refilled Norco 7.5-325mg  1-2 tabs daily (#60, 0 refills) - printed rx x 3 for 3 month supply

## 2013-08-06 NOTE — Assessment & Plan Note (Signed)
Controlled on Xanax.  Plan: 1. Refilled Xanx 0.5mg  tabs x 2 daily (#60, 3 refills)

## 2013-08-06 NOTE — Assessment & Plan Note (Signed)
Well-controlled HTN Low normal with concern for inc risk of infrequent symptomatic hypotension at home   Plan:  1. No change at this time. Suggested similar switch from CCB-ACEi to simply ACEi or ARB would be beneficial for reducing risk of hypotension. Will wait to discuss further with daughter at next visit. 2. DC'd Benazepril. Continue Amlodipine-Benazepril 10-20mg  daily 3. Continue monitoring home BP 4. RTC 1 month to f/u BP specifically

## 2013-08-06 NOTE — Progress Notes (Signed)
Patient ID: [name], female   DOB: Mar 23, 1929, 80 y.o.   MRN: [id number] Subjective:    Note - Patient's daughter Jakaylin Latney accompanied patient to visit, and provided portion of history. Towards end of visit [name] experienced significant chest pain, called clinic staff for help, and arranged for her to be taken via wheelchair to [facility] ED, patient visit was completed.  HPI  ALLERGIES:  - Reports eyes swollen, nose running, difficulty with balance, x 1 month, complains of environmental allergies, worsening symptoms in Spring / Summer, used to take medication years ago cannot recall which one. Not currently on allergy medicine. Taking Nasonex nasal spray without any significant relief - Denies fever/chills, HA, congestion, cough  CHRONIC DM, Type 2: Reports no concerns CBGs: Avg 120, Low 80, High >200. Checks CBGs 2x daily (AM & PM) Meds: Metformin 1000mg  BID Reports good compliance. Tolerating well w/o side-effects Currently on ACEi Lifestyle: Occasional walking around neighborhood Denies hypoglycemia, polyuria, visual changes, numbness or tingling.  CHRONIC HTN: Stable BP today 115/48 - reports frequently checks BP at home daily, occasionally low SBP 100s, feels weak - Last OV 05/22/13 DC'd Amlodipine-Benazepril 10-20mg  tab and replaced with only Benazepril 20mg  daily, however pt reported significant increase in BP 140s, and reported a SBP 200?, patient was scared and resumed original BP med. However still complains of occasional episodes of feeling weak and low BP - Denies syncope, lightheadedness / dizziness, vertigo, vision changes, CP, SOB  ANXIETY / CHRONIC PAIN: - Requesting 3 month supply refills for Xanax, Norco  I have reviewed and updated the following as appropriate: allergies and current medications  Social Hx: Never smoker, uses smokeless tobacco   Review of Systems  See above HPI.     Objective:   Physical Exam  BP 115/48  Pulse 70  Temp(Src)  98.3 F (36.8 C) (Oral)  Wt 134 lb (60.782 kg)  General - elderly, well-appearing, NAD  Heart - RRR, stable +2/6 SEM +R-sternal border / 2nd ICS with radiation to carotids bilaterally Lungs - CTAB, no wheezing, rhonchi, or crackles heard. Normal work of breathing  Abd - soft, NTND, no masses, +active BS  Ext - mild +1 edema bilateral LE, non-tender, +2 peripheral pulses  Neuro - awake, alert, oriented, grossly non-focaly     Assessment:     See specific A&P problem list for details.      Plan:     See specific A&P problem list for details.

## 2013-08-06 NOTE — Assessment & Plan Note (Signed)
Well-controlled Current HgbA1c 6.4 (08/06/13) - last 6.3 ([id number]) No complications or hypoglycemia  Plan: 1. Continue Metformin 1000mg  BID 2. Set goal for inc walking 15-50mins in neighborhood 3x weekly

## 2013-08-11 ENCOUNTER — Encounter: Payer: Self-pay | Admitting: *Deleted

## 2013-08-19 ENCOUNTER — Ambulatory Visit (INDEPENDENT_AMBULATORY_CARE_PROVIDER_SITE_OTHER): Payer: PRIVATE HEALTH INSURANCE | Admitting: Pharmacist Clinician (PhC)/ Clinical Pharmacy Specialist

## 2013-08-19 DIAGNOSIS — Z7901 Long term (current) use of anticoagulants: Secondary | ICD-10-CM

## 2013-08-19 LAB — PROTIME-INR: INR: 1.7 — AB (ref 0.9–1.1)

## 2013-08-19 NOTE — Progress Notes (Signed)
[facility] - pt gets INR drawn there.  They have only same phone #.

## 2013-09-02 ENCOUNTER — Ambulatory Visit (INDEPENDENT_AMBULATORY_CARE_PROVIDER_SITE_OTHER): Payer: PRIVATE HEALTH INSURANCE | Admitting: Home Health Services

## 2013-09-02 ENCOUNTER — Encounter: Payer: Self-pay | Admitting: Home Health Services

## 2013-09-02 VITALS — BP 124/65 | HR 69 | Temp 98.1°F | Ht 64.0 in | Wt 130.0 lb

## 2013-09-02 DIAGNOSIS — Z0000 Encounter for general adult medical examination without abnormal findings: Secondary | ICD-10-CM

## 2013-09-04 NOTE — Progress Notes (Signed)
Patient here for annual wellness visit, patient reports: Risk Factors/Conditions needing evaluation or treatment: Pt. Does not have any risk factors that need evaluation.  Home Safety: Pt lives at home, with daughter and 2 grandchildren in a 1 story home.  Pt reports having smoke alarms. Other Information: Corrective lens: Pt does not wear corrective lens, has irregular eye exams. Dentures: Pt has dentures but they do not fit and she doesn't wear them. Memory: Pt denies memory problems.  Patient's Mini Mental Score (recorded in doc. flowsheet): 28 Bladder:  Pt denies problems with bladder control.  BMI/Exercise: We discussed BMI and strategies for weight maintenance including eating 3 meals a day.  We also discussed  starting a regular exercise routine.  Med Adherence:  We discussed importance of taking medications for htn, cholesterol.  Pt reports missing 0 day in the past week.  ADL/IADL:  Pt reports independence in most functions. Balance/Gait: Pt reports 1 falls in the past year.  We discussed home safety and fall prevention.   Hearing:  Pt did not pass hearing screening, she is not concerned about hearing loss.     Annual Wellness Visit Requirements Recorded Today In  Medical, family, social history Past Medical, Family, Social History Section  Current providers Care team  Current medications Medications  Wt, BP, Ht, BMI Vital signs  Hearing assessment (welcome visit) Hearing/vision  Tobacco, alcohol, illicit drug use History  ADL Nurse Assessment  Depression Screening Nurse Assessment  Cognitive impairment Nurse Assessment  Mini Mental Status Document Flowsheet  Fall Risk Fall/Depression  Home Safety Progress Note  End of Life Planning (welcome visit) Social Documentation  Medicare preventative services Progress Note  Risk factors/conditions needing evaluation/treatment Progress Note  Personalized health advice Patient Instructions, goals, letter  Diet & Exercise Social  Documentation  Emergency Contact Social Documentation  Seat Belts Social Documentation  Sun exposure/protection Social Documentation

## 2013-09-15 LAB — POCT INR: INR: 2.3

## 2013-09-16 ENCOUNTER — Ambulatory Visit (INDEPENDENT_AMBULATORY_CARE_PROVIDER_SITE_OTHER): Payer: PRIVATE HEALTH INSURANCE | Admitting: Pharmacist Clinician (PhC)/ Clinical Pharmacy Specialist

## 2013-09-16 DIAGNOSIS — Z7901 Long term (current) use of anticoagulants: Secondary | ICD-10-CM

## 2013-09-19 ENCOUNTER — Encounter: Payer: Self-pay | Admitting: Cardiovascular Disease

## 2013-09-19 ENCOUNTER — Ambulatory Visit (INDEPENDENT_AMBULATORY_CARE_PROVIDER_SITE_OTHER): Payer: PRIVATE HEALTH INSURANCE | Admitting: Cardiovascular Disease

## 2013-09-19 VITALS — BP 112/50 | HR 70 | Resp 20 | Ht 64.0 in | Wt 133.0 lb

## 2013-09-19 DIAGNOSIS — Z950 Presence of cardiac pacemaker: Secondary | ICD-10-CM

## 2013-09-19 DIAGNOSIS — I2510 Atherosclerotic heart disease of native coronary artery without angina pectoris: Secondary | ICD-10-CM

## 2013-09-19 DIAGNOSIS — I10 Essential (primary) hypertension: Secondary | ICD-10-CM

## 2013-09-19 DIAGNOSIS — E785 Hyperlipidemia, unspecified: Secondary | ICD-10-CM

## 2013-09-19 DIAGNOSIS — I4891 Unspecified atrial fibrillation: Secondary | ICD-10-CM

## 2013-09-19 LAB — MDC_IDC_ENUM_SESS_TYPE_INCLINIC
Battery Remaining Longevity: 102 mo
Brady Statistic RV Percent Paced: 1.9 %
Date Time Interrogation Session: 20150724103424
Implantable Pulse Generator Model: 2210
Implantable Pulse Generator Serial Number: 7445049
Lead Channel Pacing Threshold Amplitude: 1.5 V
Lead Channel Pacing Threshold Pulse Width: 0.5 ms
Lead Channel Pacing Threshold Pulse Width: 1 ms
Lead Channel Pacing Threshold Pulse Width: 1 ms
Lead Channel Sensing Intrinsic Amplitude: 4.3 mV
Lead Channel Setting Pacing Amplitude: 3 V
Lead Channel Setting Pacing Pulse Width: 1 ms
Lead Channel Setting Sensing Sensitivity: 1 mV
MDC IDC MSMT BATTERY VOLTAGE: 2.96 V
MDC IDC MSMT LEADCHNL RA IMPEDANCE VALUE: 387.5 Ohm
MDC IDC MSMT LEADCHNL RA PACING THRESHOLD AMPLITUDE: 1.25 V
MDC IDC MSMT LEADCHNL RA SENSING INTR AMPL: 1.6 mV
MDC IDC MSMT LEADCHNL RV IMPEDANCE VALUE: 275 Ohm
MDC IDC MSMT LEADCHNL RV PACING THRESHOLD AMPLITUDE: 1.5 V
MDC IDC SET LEADCHNL RA PACING AMPLITUDE: 2.25 V
MDC IDC STAT BRADY RA PERCENT PACED: 97 %

## 2013-09-19 LAB — PACEMAKER DEVICE OBSERVATION

## 2013-09-19 MED ORDER — BENAZEPRIL HCL 20 MG PO TABS: 20.0000 mg | ORAL_TABLET | Freq: Every day | ORAL | Status: DC

## 2013-09-19 NOTE — Patient Instructions (Addendum)
Remote monitoring is used to monitor your pacemaker from home. This monitoring reduces the number of office visits required to check your device to one time per year. It allows [place] to keep an eye on the functioning of your device to ensure it is working properly. You are scheduled for a device check from home on 12-22-2013. You may send your transmission at any time that day. If you have a wireless device, the transmission will be sent automatically. After your physician reviews your transmission, you will receive a postcard with your next transmission date.  Your physician recommends that you schedule a follow-up appointment in: 12 months with Dr.Croitoru  Your physician has recommended you make the following change in your medication: stop digoxin and lotrel. Start new prescription for benazepril 20 mg this has already been sent to the pharmacy.

## 2013-09-19 NOTE — Progress Notes (Signed)
Patient ID: [name], female   DOB: 05-25-1929, 80 y.o.   MRN: [id number]      Reason for office visit Status post aortic valve replacement, sinus node dysfunction status post pacemaker, paroxysmal atrial fibrillation, peripheral venous insufficiency   [name] complains of frequent episodes of weakness and lightheadedness and has often recorded SBP of 80-85 mm Hg. She has not experienced syncope. She does not have edema or dyspnea. She takes a very low dose of loop diuretic. Her appetite is poor and she believes she has lost weight (although our office weight was the same 6 months ago). No bleeding problems, no embolic or neurological events.  Pacemaker check shows normal device function. 97% A, paced 2% V paced. A single 6-beat run of NSVT, no atrial fib, 13 episodes of brief atrial tachycardia/flutter 248 bpm (up to 14 seconds).  In 2004 she underwent replacement of her aortic valve with a 21 mm biological prosthesis by Dr. Servando Snare.  In 2005 she received a dual-chamber permanent pacemaker and had a generator change in 2014 ([employer] accident DR RF device). Her atrial lead has slightly elevated pacing thresholds (2.25 V at 0.5 ms , 1.5'@1' .0 ms)) but her device otherwise functions normally. She has very infrequent episodes of brief paroxysmal atrial tachycardia. She does have a history of atrial fibrillation for which he takes warfarin. This is monitored by our Coumadin clinic but she has a blood drawn in [place] at the urgent care. She has severe hypercholesterolemia requiring simultaneous treatment with Crestor and Zetia but by coronary angiography before valve replacement shown he had a 40% LAD stenosis without other meaningful coronary disease. She has preserved left ventricular systolic function with an echo was recently performed in March of this year. Prosthetic valve function was normal at that time.     Allergies  Allergen Reactions  . Pravachol [Pravastatin Sodium]    myalgias    Current Outpatient Prescriptions  Medication Sig Dispense Refill  . ALPRAZolam (XANAX) 0.5 MG tablet TAKE 1 TABLET BY MOUTH 2 TIMES DAILY AS NEEDED FOR ANXIETY  60 tablet  3  . atenolol (TENORMIN) 50 MG tablet TAKE 1 AND 1/2 TABLETS (75 MG TOTAL) BY MOUTH DAILY.  45 tablet  3  . clotrimazole-betamethasone (LOTRISONE) lotion       . fluticasone (FLONASE) 50 MCG/ACT nasal spray Place 2 sprays into both nostrils daily.  16 g  1  . furosemide (LASIX) 20 MG tablet TAKE 1 TABLET BY MOUTH EVERY DAY  30 tablet  6  . HYDROcodone-acetaminophen (NORCO) 7.5-325 MG per tablet Take 1 tablet by mouth 2 (two) times daily as needed.  60 tablet  0  . loratadine (CLARITIN) 10 MG tablet Take 1 tablet (10 mg total) by mouth daily.  30 tablet  2  . metFORMIN (GLUCOPHAGE) 1000 MG tablet TAKE 1 TABLET BY MOUTH TWICE A DAY WITH A MEAL  60 tablet  5  . Omega-3 Fatty Acids (FISH OIL) 1000 MG CAPS 2 caps by mouth daily       . omeprazole (PRILOSEC) 20 MG capsule Take 1 capsule (20 mg total) by mouth daily.  90 capsule  3  . pantoprazole (PROTONIX) 40 MG tablet TAKE 1 TABLET (40 MG TOTAL) BY MOUTH DAILY.  30 tablet  3  . benazepril (LOTENSIN) 20 MG tablet Take 1 tablet (20 mg total) by mouth daily.  30 tablet  11  . warfarin (COUMADIN) 5 MG tablet Take 1 to 1 & 1/2 tablets by mouth daily as  directed  150 tablet  1   No current facility-administered medications for this visit.    Past Medical History  Diagnosis Date  . PAF (paroxysmal atrial fibrillation)   . HTN (hypertension)   . GERD (gastroesophageal reflux disease)   . Anxiety   . CAD (coronary artery disease)   . CHF (congestive heart failure)     EF 55% on 01/05/11, trace AR w/ Bioprosthetic valve  . Tobacco abuse   . DM2 (diabetes mellitus, type 2)   . Restless legs syndrome (RLS)   . HLD (hyperlipidemia)   . Anemia     Past Surgical History  Procedure Laterality Date  . Aortic valve replacement  08/25/2002    tissue valve  . Pacemaker  insertion  04/02/2012    [employer]    Family History  Problem Relation Age of Onset  . Heart attack Brother   . Kidney disease Sister     History   Social History  . Marital Status: Widowed    Spouse Name: N/A    Number of Children: 5  . Years of Education: 5   Occupational History  . Retired-Farmer    Social History Main Topics  . Smoking status: Never Smoker   . Smokeless tobacco: Current User    Types: Snuff  . Alcohol Use: No  . Drug Use: No  . Sexual Activity: Not on file   Other Topics Concern  . Not on file   Social History Narrative   Health Care POA:    Emergency Contact: daughter, [name] [phone]   End of Life Plan: gave pt ad pamphlet   Who lives with you: daughter and 2 grandchildren   Any pets: 2 dogs   Diet: pt has a varied diet of protein, starch and vegetables.   Exercise: pt garden's daily   Seatbelts: pt does not wear seatbelt because of pace maker   Sun Exposure/Protection: pt does not use sun protection, has irregular dermatology appointments.   Hobbies: gardening, sitting on porch.          Review of systems: The patient specifically denies any chest pain at rest or with exertion, dyspnea at rest or with exertion, orthopnea, paroxysmal nocturnal dyspnea, syncope, palpitations, focal neurological deficits, intermittent claudication, unexplained weight gain, cough, hemoptysis or wheezing.  The patient also denies abdominal pain, nausea, vomiting, dysphagia, diarrhea, constipation, polyuria, polydipsia, dysuria, hematuria, frequency, urgency, abnormal bleeding or bruising, fever, chills, unexpected weight changes, mood swings, change in skin or hair texture, change in voice quality, auditory or visual problems, allergic reactions or rashes, new musculoskeletal complaints other than usual "aches and pains".   PHYSICAL EXAM BP 112/50  Pulse 70  Resp 20  Ht '5\' 4"'  (1.626 m)  Wt 133 lb (60.328 kg)  BMI 22.82 kg/m2 General: Alert, oriented x3, no  distress  Head: no evidence of trauma, PERRL, EOMI, no exophtalmos or lid lag, no myxedema, no xanthelasma; normal ears, nose and oropharynx  Neck: normal jugular venous pulsations and no hepatojugular reflux; brisk carotid pulses without delay and no carotid bruits  Chest: clear to auscultation, no signs of consolidation by percussion or palpation, normal fremitus, symmetrical and full respiratory excursions; healthy subclavian pacemaker site, healthy sternotomy scar  Cardiovascular: normal position and quality of the apical impulse, regular rhythm, normal first and second heart sounds, musical 2/6 early peaking systolic ejection murmur at the right upper sternal border radiating slightly towards the carotids, no rubs or gallops  Abdomen: no tenderness or distention, no masses  by palpation, no abnormal pulsatility or arterial bruits, normal bowel sounds, no hepatosplenomegaly  Extremities: no clubbing, cyanosis or edema; 2+ radial, ulnar and brachial pulses bilaterally; 2+ right femoral, posterior tibial and dorsalis pedis pulses; 2+ left femoral, posterior tibial and dorsalis pedis pulses; no subclavian or femoral bruits  Neurological: grossly nonfocal   EKG: A paced, V sensed, LAFB, nonspecific T wave changes (old)  Lipid Panel     Component Value Date/Time   CHOL 305* 04/06/2009 2033   TRIG 203* 04/06/2009 2033   HDL 45 04/06/2009 2033   CHOLHDL 6.8 Ratio 04/06/2009 2033   VLDL 41* 04/06/2009 2033   LDLCALC 219* 04/06/2009 2033    BMET    Component Value Date/Time   NA 138 12/03/2012 1204   K 4.6 12/03/2012 1204   CL 103 12/03/2012 1204   CO2 29 12/03/2012 1204   GLUCOSE 103* 12/03/2012 1204   BUN 16 12/03/2012 1204   CREATININE 1.08 12/03/2012 1204   CREATININE 1.01 11/13/2009 1538   CALCIUM 9.7 12/03/2012 1204   GFRNONAA 53* 11/13/2009 1538   GFRAA  Value: >60        The eGFR has been calculated using the MDRD equation. This calculation has not been validated in all clinical situations. eGFR's  persistently <60 mL/min signify possible Chronic Kidney Disease. 11/13/2009 1538     ASSESSMENT AND PLAN No problem-specific assessment & plan notes found for this encounter.  Pacemaker - [place] dual chamber Pacemaker check in clinic. Normal device function. Remote check via [employer] in 3 months, office visit in 12 months. Should be considered pacemaker dependent (virtually sinus arrest, intact AV conduction).  Atrial fibrillation  Very low burden of arrhythmia but keep on warfarin indefinitely barring any bleeding complications. She never has had high ventricular rates and I think at her age the digoxin is more likely to be a liability than benefit. I recommended that she stop this medication at her last appointment and repeated that recommendation today. Continue beta blocker for rate control.   HYPERLIPIDEMIA  Cannot locate a recheck on her cholesterol since starting the zetia in addition to Crestor. Will order today.  HYPERTENSION, BENIGN  Excessive control. Discontinue amlodipine component of her Lotrel. Reevaluate BP at home - she will send [place] some recordings after 2-3 weeks.  S/P AVR bioprosthesis Normal valve function, normal LVEF by echo 2104   Patient Instructions  Remote monitoring is used to monitor your pacemaker from home. This monitoring reduces the number of office visits required to check your device to one time per year. It allows [place] to keep an eye on the functioning of your device to ensure it is working properly. You are scheduled for a device check from home on 12-22-2013. You may send your transmission at any time that day. If you have a wireless device, the transmission will be sent automatically. After your physician reviews your transmission, you will receive a postcard with your next transmission date.  Your physician recommends that you schedule a follow-up appointment in: 12 months with Dr.Patricia Perales  Your physician has recommended you make the following change  in your medication: stop digoxin and lotrel. Start new prescription for benazepril 20 mg this has already been sent to the pharmacy.    Orders Placed This Encounter  Procedures  . Implantable device check  . EKG 12-Lead   Meds ordered this encounter  Medications  . benazepril (LOTENSIN) 20 MG tablet    Sig: Take 1 tablet (20 mg total) by mouth  daily.    Dispense:  30 tablet    Refill:  [address] MD, [facility] [phone] office [phone] pager

## 2013-09-22 ENCOUNTER — Telehealth: Payer: Self-pay | Admitting: *Deleted

## 2013-09-22 DIAGNOSIS — E782 Mixed hyperlipidemia: Secondary | ICD-10-CM

## 2013-09-22 DIAGNOSIS — Z79899 Other long term (current) drug therapy: Secondary | ICD-10-CM

## 2013-09-22 NOTE — Telephone Encounter (Signed)
Message copied by Raiford Simmonds on Mon Sep 22, 2013  8:49 AM ------      Message from: Sanda Klein      Created: Fri Sep 19, 2013  7:09 PM       Need a lipid profile and CMET please ------

## 2013-09-22 NOTE — Telephone Encounter (Signed)
Left message on voice mail to have labs done - on fasting state- lipids, cmp May call with any question.

## 2013-10-02 ENCOUNTER — Encounter: Payer: Self-pay | Admitting: Cardiovascular Disease

## 2013-10-06 ENCOUNTER — Other Ambulatory Visit: Payer: Self-pay | Admitting: Family Medicine

## 2013-10-06 DIAGNOSIS — E119 Type 2 diabetes mellitus without complications: Secondary | ICD-10-CM

## 2013-10-08 ENCOUNTER — Encounter: Payer: Self-pay | Admitting: Home Health Services

## 2013-10-08 NOTE — Progress Notes (Signed)
Patient ID: [name], female   DOB: 06-Nov-1929, 80 y.o.   MRN: [id number]  Primary Physician addendum:  I have reviewed this visit and discussed with Lamont Dowdy and agree with her documentation.  [name], [place], PGY-2

## 2013-10-15 LAB — POCT INR: INR: 1.3

## 2013-10-17 ENCOUNTER — Telehealth: Payer: Self-pay | Admitting: Family Medicine

## 2013-10-17 ENCOUNTER — Telehealth: Payer: Self-pay | Admitting: Cardiovascular Disease

## 2013-10-17 NOTE — Telephone Encounter (Signed)
[name] is a 80 y.o. female, called into the after hours line, with complaints of "Stomach jerking" that woke her up. Her daughter is calling for her and states her mother has pacemaker that is suppose to "fire" at 53, and she took her mothers BP with automatic cuff and it showed her Bp was "ok" but her pulse was 44 in the left arm and 66 in the right arm. The pacemaker is 78 years old. The patient was just seen in the cardiologist office on 10/02/2013 and they were told her pacemaker was functioning properly.  Her mother is not having chest pain, her abdominal "jerking" is a common sx she has, no SOB, dizziness, presyncope or syncope. - Pt advised to call the office at 830a and make an appointment to be seen in [place].  - Pt advised if she experiences any symptoms such as CP, SOB, dizziness or presyncope/syncope she needs to be seen in the ED right away. Given she is asymptomatic it is believed at this time she can be seen in [place].  Howard Pouch DO PGY-3 [facility]

## 2013-10-17 NOTE — Telephone Encounter (Signed)
Please call,just fid her pacemaker check over the phone. She said she was told to call  Her after she did it.

## 2013-10-20 ENCOUNTER — Telehealth: Payer: Self-pay | Admitting: Cardiovascular Disease

## 2013-10-20 NOTE — Telephone Encounter (Signed)
She called again today and said she still have not heard from anybody.

## 2013-10-20 NOTE — Telephone Encounter (Signed)
Pt. Requesting  INR results

## 2013-10-20 NOTE — Telephone Encounter (Signed)
Would like Coumadin results from last Thursday,she has it Urgent Care in [place],

## 2013-10-21 ENCOUNTER — Ambulatory Visit (INDEPENDENT_AMBULATORY_CARE_PROVIDER_SITE_OTHER): Payer: PRIVATE HEALTH INSURANCE | Admitting: Pharmacist Clinician (PhC)/ Clinical Pharmacy Specialist

## 2013-10-21 DIAGNOSIS — Z7901 Long term (current) use of anticoagulants: Secondary | ICD-10-CM

## 2013-10-21 NOTE — Telephone Encounter (Signed)
[name]  per our conversation please call patient again.

## 2013-10-21 NOTE — Telephone Encounter (Signed)
Please call,third call. I have been sending to [facility] see if you can help the pt. Also wants her Coumadin results.

## 2013-10-22 NOTE — Telephone Encounter (Signed)
She said they have been waiting for 4 days to hear from somebody. Her heart rate is getting low and gets weak.

## 2013-10-22 NOTE — Telephone Encounter (Signed)
Explained to dgtr that the contact number provided in the message was incorrect and message was left there on Friday. [name] stated that it was the old number and asked that it be changed to her new one. Done.   She asked if the remote from Friday showed anything abn bc pts BP monitor says that her HR was in the 40s. Pt w/o sxms. I informed her that the BP monitor was inaccurate. Remote was WNL. HR primarily at 70. Advised her to take manual HR measurements. Dgtr voiced understanding. Will continue F/U as scheduled.

## 2013-11-05 ENCOUNTER — Encounter: Payer: Self-pay | Admitting: Family Medicine

## 2013-11-05 ENCOUNTER — Ambulatory Visit (INDEPENDENT_AMBULATORY_CARE_PROVIDER_SITE_OTHER): Payer: PRIVATE HEALTH INSURANCE | Admitting: Family Medicine

## 2013-11-05 VITALS — BP 126/75 | HR 74 | Temp 98.2°F | Wt 134.0 lb

## 2013-11-05 DIAGNOSIS — G894 Chronic pain syndrome: Secondary | ICD-10-CM

## 2013-11-05 DIAGNOSIS — I10 Essential (primary) hypertension: Secondary | ICD-10-CM

## 2013-11-05 DIAGNOSIS — Z23 Encounter for immunization: Secondary | ICD-10-CM

## 2013-11-05 DIAGNOSIS — E119 Type 2 diabetes mellitus without complications: Secondary | ICD-10-CM

## 2013-11-05 DIAGNOSIS — F341 Dysthymic disorder: Secondary | ICD-10-CM

## 2013-11-05 LAB — POCT GLYCOSYLATED HEMOGLOBIN (HGB A1C): Hemoglobin A1C: 6.4

## 2013-11-05 MED ORDER — HYDROCODONE-ACETAMINOPHEN 7.5-325 MG PO TABS: 1.0000 | ORAL_TABLET | Freq: Two times a day (BID) | ORAL | Status: DC | PRN

## 2013-11-05 MED ORDER — ALPRAZOLAM 1 MG PO TABS: 1.0000 mg | ORAL_TABLET | Freq: Two times a day (BID) | ORAL | Status: DC | PRN

## 2013-11-05 NOTE — Assessment & Plan Note (Addendum)
Well-controlled BP - Reduced concern for hypotension on new BP regimen  Plan: 1. Continue Benazepril 20mg  daily only for BP, recently DC'd Amlodipine (per Cards), also previously discussed at last visit 2. Continue home BP monitoring, may bring cuff into office for calibration 3. RTC 3 months, continue to f/u with Cardiology

## 2013-11-05 NOTE — Patient Instructions (Addendum)
Dear [name], Thank you for coming in to clinic today. It was good to see you!  Today we discussed your Diabetes, Blood Pressure, Anxiety, and medications. 1. For your Diabetes, we will check your Hemoglobin A1c today, and call you with results. 2. Continue checking your sugar as you are, and taking your Metformin. No changes at this time. 3. I will fill out these papers and re-order you a Glucometer and testing supplies. Call our clinic if there is any major problem with receiving these. We may need to repeat it if it is declined by insurance. 4. For your Blood Pressure, continue taking just the Benazepril 20mg  daily as we discussed. Keep checking pressure and heart rate at home, bring numbers to next visit. 5. For your Anxiety, we have increased Xanax to 1mg  twice daily as needed. Start with 0.5mg  in morning and 1mg  in evening for about 3 or 5 days, then increase to 1mg  twice daily. 6. Request labs to be sent to our office from Cardiologist office.  Some important numbers from today's visit: HgA1C - pending BP - 126/75  Please schedule a follow-up appointment with me (Dr. Parks Ranger) in 3 months to re-check BP and Diabetes follow-up.  If you have any worsening symptoms please return to clinic for sooner appointment.  If you have any other questions or concerns, please feel free to call the clinic to contact me. You may also schedule an earlier appointment if necessary.  However, if your symptoms get significantly worse, please go to the Emergency Department to seek immediate medical attention.  [name], [place]

## 2013-11-05 NOTE — Assessment & Plan Note (Signed)
Well-controlled Current HgbA1c 6.4 (11/05/13) - last 6.4 ([id number]) No complications or hypoglycemia  Plan: 1. Continue Metformin 1000mg  BID 2. Requested BMET and labs from Cardiologist office 2. Continue to work on goal for inc walking 15-105mins in neighborhood 3x weekly

## 2013-11-05 NOTE — Assessment & Plan Note (Signed)
Controlled on Xanax, chronic hx on BDZ, now with concerns of less improvement in anxiety symptoms, likely due to physiologic adjustment to dose  Plan: 1. Trial increased dose up to Xanax 1mg  BID, titrate up with 0.5mg  in AM and 1mg  PM for 1 week, then inc to 1mg  BID 2. Strongly advised no further increases in dose, given caution with age, concern for over sedation, and abuse potential, patient aware of risks. Declines offers of alternative therapies, will consider if no improvement at increased dose 3. RTC 3 months

## 2013-11-05 NOTE — Assessment & Plan Note (Signed)
Controlled on Norco  Plan: 1. Refilled Norco 7.5-325mg  1-2 tabs daily (#60, 0 refills) - printed rx x 3 for 3 month supply

## 2013-11-05 NOTE — Progress Notes (Signed)
Patient ID: [name], female   DOB: [birth year], 80 y.o.   MRN: [id number]  Subjective:    History provided by patient and daughter [name] during interview.  HPI  CHRONIC DM, Type 2: Reports no concerns CBGs: Avg 120, Low 80, High >200. Checks CBGs 2x daily (AM & PM) Meds: Metformin 1000mg  BID Reports good compliance. Tolerating well w/o side-effects Currently on ACEi Lifestyle: Occasional walking around neighborhood Denies hypoglycemia, polyuria, visual changes, numbness or tingling.  CHRONIC HTN: Stable BP today 126/75. Reports BP cuff at home readings have had low HR 40-50s but seems to be inaccurate, reported to Cardiologist office. BP at home 140/70s. - Improved with fewer episodes of intermittent weakness. - Med changes - per Cardiology discontinued Amlodipine, now only on Benazepril 20mg  daily (similar to prior change made that patient did not comply with) - Denies syncope, lightheadedness / dizziness, vertigo, vision changes, CP, SOB  ANXIETY / CHRONIC PAIN: - Requesting 3 month supply refills for Xanax and Norco - Admits to not feeling any improvement with Xanax now, previously improved, states has been on it for >40 years, and requesting to try slightly increased dose  I have reviewed and updated the following as appropriate: allergies and current medications  Social Hx: Never smoker, uses smokeless tobacco   Review of Systems  See above HPI.     Objective:   Physical Exam  BP 126/75  Pulse 74  Temp(Src) 98.2 F (36.8 C) (Oral)  Wt 134 lb (60.782 kg)  General - elderly, well-appearing, conversational, NAD HEENT - oropharynx clear, MMM Heart - RRR, stable +2/6 SEM +R-sternal border / 2nd ICS with radiation to carotids bilaterally Lungs - CTAB, no wheezing, rhonchi, or crackles heard. Normal work of breathing  Abd - soft, NTND, no masses, +active BS  MSK - back non-tender to palpation over spinous processes Ext - stable mild +1 edema bilateral LE,  non-tender, +2 peripheral pulses  Neuro - awake, alert, oriented, grossly non-focal, distal muscle strength 5/5 bilateral upper and lower ext, intact distal sensation to light touch     Assessment:     See specific A&P problem list for details.      Plan:     See specific A&P problem list for details.

## 2013-11-09 ENCOUNTER — Other Ambulatory Visit: Payer: Self-pay | Admitting: Family Medicine

## 2013-11-09 ENCOUNTER — Other Ambulatory Visit: Payer: Self-pay | Admitting: Cardiovascular Disease

## 2013-11-12 LAB — HM DIABETES EYE EXAM

## 2013-11-13 LAB — PROTIME-INR: INR: 1.6 — AB (ref 0.9–1.1)

## 2013-11-14 ENCOUNTER — Ambulatory Visit (INDEPENDENT_AMBULATORY_CARE_PROVIDER_SITE_OTHER): Payer: PRIVATE HEALTH INSURANCE | Admitting: Pharmacist Clinician (PhC)/ Clinical Pharmacy Specialist

## 2013-11-14 DIAGNOSIS — Z7901 Long term (current) use of anticoagulants: Secondary | ICD-10-CM

## 2013-11-17 ENCOUNTER — Encounter: Payer: Self-pay | Admitting: Family Medicine

## 2013-11-17 ENCOUNTER — Other Ambulatory Visit: Payer: Self-pay | Admitting: Family Medicine

## 2013-11-20 ENCOUNTER — Other Ambulatory Visit: Payer: Self-pay | Admitting: *Deleted

## 2013-11-21 ENCOUNTER — Other Ambulatory Visit: Payer: Self-pay | Admitting: *Deleted

## 2013-11-21 ENCOUNTER — Telehealth: Payer: Self-pay | Admitting: Cardiovascular Disease

## 2013-11-21 MED ORDER — EZETIMIBE 10 MG PO TABS: 10.0000 mg | ORAL_TABLET | Freq: Every day | ORAL | Status: DC

## 2013-11-21 NOTE — Telephone Encounter (Signed)
[name] already spoke to patient.Patient is taking zetia 10 mg daily.Refill was sent to pharmacy.

## 2013-11-21 NOTE — Telephone Encounter (Signed)
Pt was returning Gail's call

## 2013-11-22 ENCOUNTER — Telehealth: Payer: Self-pay | Admitting: Cardiology

## 2013-11-22 NOTE — Telephone Encounter (Signed)
Pt's daughter Cecille Rubin concerning her mother's coumadin dose, they had not rec'd information previously.  I gave instructions from note on 11/14/13.  They will recheck on Monday.

## 2013-12-02 LAB — PROTIME-INR: INR: 3 — AB (ref 0.9–1.1)

## 2013-12-03 ENCOUNTER — Telehealth: Payer: Self-pay | Admitting: Family Medicine

## 2013-12-03 NOTE — Telephone Encounter (Signed)
Reviewed chart, last OV 11/05/13 received paperwork from medical supply company to order patient's glucometer per Medicare, paperwork was completed and faxed out at that time. Called patient to discuss, stated that they have not received any glucometer and were unsure of the medical supply company. Requested to new rx to [facility]com[facility] at [facility].  [facility]ed [facility], per pharmacist will fax over hand written rx for OneTouch glucometer, test strips, lancets, quantity sufficient for monthly testing (test blood sugar BID), refill 11 (1 year supply), to notify patient when ready for pick-up. Signed with NPI # and attending signature / NPI.  [name], [place], PGY-2

## 2013-12-03 NOTE — Telephone Encounter (Signed)
Daughter states last time patient was seen a new glucometer was supposed to be send to pharmacy. Pharmacy never received anything and pt needs it asap. Pls advise.

## 2013-12-04 ENCOUNTER — Ambulatory Visit (INDEPENDENT_AMBULATORY_CARE_PROVIDER_SITE_OTHER): Payer: PRIVATE HEALTH INSURANCE | Admitting: Pharmacist Clinician (PhC)/ Clinical Pharmacy Specialist

## 2013-12-18 LAB — PROTIME-INR: INR: 4.7 — AB (ref 0.9–1.1)

## 2013-12-19 ENCOUNTER — Ambulatory Visit (INDEPENDENT_AMBULATORY_CARE_PROVIDER_SITE_OTHER): Payer: PRIVATE HEALTH INSURANCE | Admitting: Pharmacist Clinician (PhC)/ Clinical Pharmacy Specialist

## 2013-12-19 DIAGNOSIS — I4891 Unspecified atrial fibrillation: Secondary | ICD-10-CM

## 2013-12-23 ENCOUNTER — Ambulatory Visit (INDEPENDENT_AMBULATORY_CARE_PROVIDER_SITE_OTHER): Payer: PRIVATE HEALTH INSURANCE | Admitting: *Deleted

## 2013-12-23 ENCOUNTER — Encounter: Payer: Self-pay | Admitting: Cardiovascular Disease

## 2013-12-23 ENCOUNTER — Telehealth: Payer: Self-pay | Admitting: Cardiovascular Disease

## 2013-12-23 DIAGNOSIS — I4891 Unspecified atrial fibrillation: Secondary | ICD-10-CM

## 2013-12-23 NOTE — Telephone Encounter (Signed)
[name] states the [name] received an automated call to call to have her pacemaker checked.  Patient does not have any instructions for this procedure.  Please call.

## 2013-12-23 NOTE — Telephone Encounter (Signed)
will forward to the device clinic

## 2013-12-23 NOTE — Progress Notes (Signed)
Remote pacemaker transmission.   

## 2013-12-24 NOTE — Telephone Encounter (Signed)
[facility] stating their monitor is automatic, transmission already received. Left my direct #.

## 2013-12-26 LAB — MDC_IDC_ENUM_SESS_TYPE_REMOTE
Battery Remaining Percentage: 91 %
Battery Voltage: 2.98 V
Brady Statistic AP VP Percent: 3.5 %
Brady Statistic AS VP Percent: 1 %
Brady Statistic RA Percent Paced: 85 %
Brady Statistic RV Percent Paced: 3.7 %
Date Time Interrogation Session: 20151027060010
Implantable Pulse Generator Model: 2210
Implantable Pulse Generator Serial Number: 7445049
Lead Channel Impedance Value: 280 Ohm
Lead Channel Impedance Value: 390 Ohm
Lead Channel Pacing Threshold Amplitude: 1.25 V
Lead Channel Pacing Threshold Pulse Width: 0.5 ms
Lead Channel Sensing Intrinsic Amplitude: 1.3 mV
MDC IDC MSMT BATTERY REMAINING LONGEVITY: 68 mo
MDC IDC MSMT LEADCHNL RV PACING THRESHOLD AMPLITUDE: 1.5 V
MDC IDC MSMT LEADCHNL RV PACING THRESHOLD PULSEWIDTH: 1 ms
MDC IDC MSMT LEADCHNL RV SENSING INTR AMPL: 4.9 mV
MDC IDC SET LEADCHNL RA PACING AMPLITUDE: 2.25 V
MDC IDC SET LEADCHNL RV PACING AMPLITUDE: 3 V
MDC IDC SET LEADCHNL RV PACING PULSEWIDTH: 1 ms
MDC IDC SET LEADCHNL RV SENSING SENSITIVITY: 1 mV
MDC IDC STAT BRADY AP VS PERCENT: 88 %
MDC IDC STAT BRADY AS VS PERCENT: 8.4 %

## 2013-12-31 ENCOUNTER — Encounter: Payer: Self-pay | Admitting: Cardiology

## 2014-01-05 ENCOUNTER — Other Ambulatory Visit: Payer: Self-pay | Admitting: *Deleted

## 2014-01-05 LAB — PROTIME-INR: INR: 1.9 — AB (ref 0.9–1.1)

## 2014-01-06 ENCOUNTER — Other Ambulatory Visit: Payer: Self-pay | Admitting: Family Medicine

## 2014-01-07 ENCOUNTER — Ambulatory Visit (INDEPENDENT_AMBULATORY_CARE_PROVIDER_SITE_OTHER): Payer: PRIVATE HEALTH INSURANCE | Admitting: Pharmacist Clinician (PhC)/ Clinical Pharmacy Specialist

## 2014-01-07 DIAGNOSIS — I4891 Unspecified atrial fibrillation: Secondary | ICD-10-CM

## 2014-01-19 ENCOUNTER — Other Ambulatory Visit: Payer: Self-pay | Admitting: Family Medicine

## 2014-01-19 LAB — PROTIME-INR: INR: 1.8 — AB (ref 0.9–1.1)

## 2014-01-20 ENCOUNTER — Ambulatory Visit (INDEPENDENT_AMBULATORY_CARE_PROVIDER_SITE_OTHER): Payer: Self-pay | Admitting: Pharmacist Clinician (PhC)/ Clinical Pharmacy Specialist

## 2014-01-20 DIAGNOSIS — I4891 Unspecified atrial fibrillation: Secondary | ICD-10-CM

## 2014-02-03 LAB — PROTIME-INR: INR: 3.2 — AB (ref 0.9–1.1)

## 2014-02-05 ENCOUNTER — Ambulatory Visit (INDEPENDENT_AMBULATORY_CARE_PROVIDER_SITE_OTHER): Payer: PRIVATE HEALTH INSURANCE | Admitting: Pharmacist Clinician (PhC)/ Clinical Pharmacy Specialist

## 2014-02-05 ENCOUNTER — Encounter (HOSPITAL_COMMUNITY): Payer: Self-pay | Admitting: Cardiovascular Disease

## 2014-02-05 DIAGNOSIS — I4891 Unspecified atrial fibrillation: Secondary | ICD-10-CM

## 2014-02-10 ENCOUNTER — Encounter: Payer: Self-pay | Admitting: Family Medicine

## 2014-02-10 ENCOUNTER — Ambulatory Visit (INDEPENDENT_AMBULATORY_CARE_PROVIDER_SITE_OTHER): Payer: PRIVATE HEALTH INSURANCE | Admitting: Family Medicine

## 2014-02-10 VITALS — BP 144/80 | HR 77 | Temp 98.2°F | Ht 64.0 in | Wt 135.2 lb

## 2014-02-10 DIAGNOSIS — I10 Essential (primary) hypertension: Secondary | ICD-10-CM

## 2014-02-10 DIAGNOSIS — J309 Allergic rhinitis, unspecified: Secondary | ICD-10-CM

## 2014-02-10 DIAGNOSIS — K5900 Constipation, unspecified: Secondary | ICD-10-CM

## 2014-02-10 DIAGNOSIS — E119 Type 2 diabetes mellitus without complications: Secondary | ICD-10-CM

## 2014-02-10 DIAGNOSIS — G894 Chronic pain syndrome: Secondary | ICD-10-CM

## 2014-02-10 DIAGNOSIS — F341 Dysthymic disorder: Secondary | ICD-10-CM

## 2014-02-10 LAB — BASIC METABOLIC PANEL
BUN: 25 mg/dL — AB (ref 6–23)
CALCIUM: 8.9 mg/dL (ref 8.4–10.5)
CO2: 25 meq/L (ref 19–32)
CREATININE: 1.4 mg/dL — AB (ref 0.50–1.10)
Chloride: 105 mEq/L (ref 96–112)
Glucose, Bld: 102 mg/dL — ABNORMAL HIGH (ref 70–99)
Potassium: 4.3 mEq/L (ref 3.5–5.3)
Sodium: 142 mEq/L (ref 135–145)

## 2014-02-10 LAB — POCT GLYCOSYLATED HEMOGLOBIN (HGB A1C): Hemoglobin A1C: 5.9

## 2014-02-10 LAB — LDL CHOLESTEROL, DIRECT: Direct LDL: 42 mg/dL

## 2014-02-10 MED ORDER — POLYETHYLENE GLYCOL 3350 17 GM/SCOOP PO POWD: 17.0000 g | Freq: Every day | ORAL | Status: DC

## 2014-02-10 MED ORDER — ALPRAZOLAM 1 MG PO TABS: 1.0000 mg | ORAL_TABLET | Freq: Two times a day (BID) | ORAL | Status: DC | PRN

## 2014-02-10 MED ORDER — HYDROCODONE-ACETAMINOPHEN 7.5-325 MG PO TABS: 1.0000 | ORAL_TABLET | Freq: Two times a day (BID) | ORAL | Status: DC | PRN

## 2014-02-10 MED ORDER — FLUTICASONE PROPIONATE 50 MCG/ACT NA SUSP: 2.0000 | Freq: Every day | NASAL | Status: DC

## 2014-02-10 NOTE — Assessment & Plan Note (Signed)
Well-controlled BP, at goal < 150/90 - Tolerating single agent anti-HTN regimen well  Plan: 1. Continue Benazepril 20mg  daily 2. Continue home BP monitoring 3. RTC 3 months, f/u with Cards

## 2014-02-10 NOTE — Assessment & Plan Note (Addendum)
Chronic LBP due to hx OA, controlled on Norco  Plan: 1. Refilled Norco 7.5-325mg  1-2 tabs daily (#60, 0 refills) - printed rx x 3 for 3 month supply

## 2014-02-10 NOTE — Patient Instructions (Signed)
Dear [name], Thank you for coming in to clinic today. It was good to see you!  1. Continue checking your sugar as you are, and taking your Metformin. No changes at this time. 2. For Blood Pressure - no changes, continue to check. Follow-up with heart doctor in January 3. It sounds like you are having some constipation, likely due to pain medication side-effect. Start Miralax 1 capful (17g) daily for next 1 week, if not improving may take up to 2 capfuls daily. Improve hydration and may even try prune juice 4. You are most likely getting over a viral infection, and having some sinus symptoms. Start Flonase nasal spray for 1 month then only use as needed. If congestion worsens may try Mucinex 5. Refilled medications   Some important numbers from today's visit: BP - 144/80  Labs checked today - will call you with results in few days.  Please schedule a follow-up appointment with me (Dr. Parks Ranger) in 3 months to re-check BP and Diabetes follow-up.  If you have any worsening symptoms (headache, fevers, chills, productive cough) please return to clinic for sooner appointment  If you have any other questions or concerns, please feel free to call the clinic to contact me. You may also schedule an earlier appointment if necessary.  However, if your symptoms get significantly worse, please go to the Emergency Department to seek immediate medical attention.  [name], [place]

## 2014-02-10 NOTE — Assessment & Plan Note (Signed)
Suspected sinus pressure secondary to allergic flare-up with consistent symptoms, also likely related to recent viral URI (+sick contacts). Not consistent with sinus infection.  Plan: 1. Refilled Flonase, use x 1 month then may use PRN 2. Continue Claritin

## 2014-02-10 NOTE — Assessment & Plan Note (Signed)
Recent increased constipation, still regular stools but some small amounts of hard stool, likely secondary to chronic opiate use - No GI red flags, no abdominal pain, no bloody stools  Plan: 1. Start Miralax 1 cap daily x 1 week, may inc to 2 caps daily. If no improvement advised trial on Colace OTC for stool softener 2. Inc fiber, hydration 3. RTC PRN

## 2014-02-10 NOTE — Assessment & Plan Note (Addendum)
Well-controlled, improved Last A1c 5.9 (02/10/14), 6.4 ([id number]) No complications or hypoglycemia - DM Foot exam (02/10/14) normal  Plan: 1. Continue Metformin 1000mg  BID 2. Order Direct LDL, A1c 3. RTC 3 months

## 2014-02-10 NOTE — Progress Notes (Signed)
   Subjective:    Patient ID: [name], female    DOB: Oct 20, 1929, 80 y.o.   MRN: [id number]  HPI  CHRONIC DM, Type 2: Reports - no concerns CBGs: Avg 120, Low 94, High 198 (none >200). Checks CBGs 2x daily Meds: Metformin 1000mg  BID Reports  good compliance. Tolerating well w/o side-effects Currently on ACEi Denies hypoglycemia, polyuria, visual changes, numbness or tingling.  CHRONIC HTN: Reports - checks BP at home, low of 116/80, without significant symptoms Current Meds - Benazepril 20mg  daily   Reports good compliance, took meds today. Tolerating well, w/o complaints. Denies CP, dyspnea, HA, edema, dizziness / lightheadedness  ANXIETY / CHRONIC PAIN: - Chronic low back pain with history of osteoarthritis - Currently functionally improved while on Norco - takes up to 2x daily PRN LBP - Requesting refill on Xanax, doing well on 1mg  BID  CONSTIPATION: - Reports over past 6 months, described "stomach is beating like my heart" "like little nerves jumping", episodes last about 10-42min, unclear trigger seems related to eating, otherwise can occur spontaneously, seems to be associated with stress. Last BM today (small) - Admits to constipation - Denies history of bloody stools or rectal bleeding, dark stools, abdominal pain, nausea / vomiting  Sinus Congestion: - Reports for past 2 weeks has had sinus congestion, runny nose, sinus pressure, eye watery/itching, coughing, sneezing. 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 with URI) - stopped using Flonase in past - Denies any HA, fevers/chills, productive cough  I have reviewed and updated the following as appropriate: allergies and current medications  Social Hx: - Never smoker  Review of Systems  See above HPI    Objective:   Physical Exam  BP 144/80 mmHg  Pulse 77  Temp(Src) 98.2 F (36.8 C) (Oral)  Ht 5\' 4"  (1.626 m)  Wt 135 lb 3.2 oz (61.326 kg)  BMI 23.20 kg/m2  Gen -  well-appearing elderly F, NAD HEENT - NCAT mild bilatearl maxillary sinus tenderness to palpation, PERRL, EOMI, b/l edematous nasal turbinates with mild congestion, oropharynx clear and symmetrical, MMM Neck - supple, non-tender, no LAD Heart - RRR, stable +2/6 SEM +R-sternal border with radiation to carotids Lungs - CTAB, no wheezing, crackles, or rhonchi. Normal work of breathing. MSK - bilateral lower back tender to palpation over paraspinal muscles, minimal tenderness directly over lumbar spinous processes, seated SLR without radicular symptoms Abd - soft, NTND, no masses, +active BS Neuro - awake, alert, grossly non-focal     Assessment & Plan:   See specific A&P problem list for details.

## 2014-02-10 NOTE — Assessment & Plan Note (Signed)
Stable on increased dose Xanax - Admits to some shaking, possible side-effect  Plan: 1. Refilled Xanax x 3 months 2. Cautioned on continued use of BDZ at her age, advised caution with potential inc fall risk

## 2014-02-12 ENCOUNTER — Telehealth: Payer: Self-pay | Admitting: Family Medicine

## 2014-02-12 NOTE — Telephone Encounter (Signed)
Last OV at [facility] 02/10/14, at that time labs drawn including BMET and LDL, reviewed labs with normal low direct LDL and overall BMET unremarkable except elevated Creatinine 1.40 (inc from 1.08 11/2013, however previously 02/2012 up to 1.29), suspect multifactorial with multiple risk factors for elevated Cr (age, DM, HTN). At time of OV patient without symptoms of dehydration or any fluid overload/edema.  Called patient to discuss results, spoke with daughter Clarene Critchley, discussed above results. Advised her to discontinue Lasix 20mg  PO daily, until upcoming Cardiology appointment in January (< 1 month), may continue Benazepril at this time. Patient not taking any NSAIDs. Advised may increase fluid intake, monitor regular weights, and can resume Lasix PRN if any symptoms of fluid overload or SOB, and to call [facility] or Cardiology if this is the case. Recommend to check with Cardiology to regarding repeating BMET at that time to monitor Cr.  [name], [place], PGY-2

## 2014-03-03 DIAGNOSIS — Z79891 Long term (current) use of opiate analgesic: Secondary | ICD-10-CM | POA: Diagnosis not present

## 2014-03-03 DIAGNOSIS — Z7901 Long term (current) use of anticoagulants: Secondary | ICD-10-CM | POA: Diagnosis not present

## 2014-03-05 ENCOUNTER — Other Ambulatory Visit: Payer: Self-pay | Admitting: Pharmacist Clinician (PhC)/ Clinical Pharmacy Specialist

## 2014-03-11 ENCOUNTER — Other Ambulatory Visit: Payer: Self-pay | Admitting: Pharmacist Clinician (PhC)/ Clinical Pharmacy Specialist

## 2014-03-17 ENCOUNTER — Other Ambulatory Visit: Payer: Self-pay | Admitting: Family Medicine

## 2014-03-23 ENCOUNTER — Other Ambulatory Visit: Payer: Self-pay | Admitting: Family Medicine

## 2014-03-24 DIAGNOSIS — H1033 Unspecified acute conjunctivitis, bilateral: Secondary | ICD-10-CM | POA: Diagnosis not present

## 2014-03-24 DIAGNOSIS — Z7901 Long term (current) use of anticoagulants: Secondary | ICD-10-CM | POA: Diagnosis not present

## 2014-03-24 DIAGNOSIS — J069 Acute upper respiratory infection, unspecified: Secondary | ICD-10-CM | POA: Diagnosis not present

## 2014-03-24 DIAGNOSIS — E1165 Type 2 diabetes mellitus with hyperglycemia: Secondary | ICD-10-CM | POA: Diagnosis not present

## 2014-03-24 LAB — PROTIME-INR: INR: 3.7 — AB (ref <=1.1)

## 2014-03-25 ENCOUNTER — Ambulatory Visit (INDEPENDENT_AMBULATORY_CARE_PROVIDER_SITE_OTHER): Payer: Self-pay | Admitting: Pharmacist Clinician (PhC)/ Clinical Pharmacy Specialist

## 2014-03-25 DIAGNOSIS — I4891 Unspecified atrial fibrillation: Secondary | ICD-10-CM

## 2014-03-29 ENCOUNTER — Other Ambulatory Visit: Payer: Self-pay | Admitting: Family Medicine

## 2014-03-31 ENCOUNTER — Telehealth: Payer: Self-pay | Admitting: Family Medicine

## 2014-04-01 NOTE — Telephone Encounter (Addendum)
Called pt regarding increase heart rate.  Pt stated her heart rate was very high yesterday and her blood pressure.  Pt stated her blood pressure was 200/200 and HR 110.  Pt denies any chest pain, dizziness or SOB.  Pt stated she has not taken her blood pressure medication yet, but took them last night.  Pt's blood pressure now 149/138, heart rate 109.  Precept with Dr. Ree Kida; have pt seen by provider.  Appt scheduled with PCP 04/02/2014 at 2:30 PM.  Advised pt and her daughter of appt tomorrow and to go to ED if she develops any chest pain, SOB, dizziness or severe headache.  Derl Barrow, RN

## 2014-04-01 NOTE — Telephone Encounter (Signed)
Daughter called to say that pt's HR have been reading 110.  Pt feeling weak.  No chest pain reported.  Please call daughter back at [phone].

## 2014-04-02 ENCOUNTER — Ambulatory Visit (INDEPENDENT_AMBULATORY_CARE_PROVIDER_SITE_OTHER): Payer: Medicare Other | Admitting: Family Medicine

## 2014-04-02 ENCOUNTER — Encounter: Payer: Self-pay | Admitting: Family Medicine

## 2014-04-02 VITALS — BP 120/74 | HR 112 | Temp 97.9°F | Ht 64.0 in | Wt 133.0 lb

## 2014-04-02 DIAGNOSIS — I4891 Unspecified atrial fibrillation: Secondary | ICD-10-CM

## 2014-04-02 DIAGNOSIS — K5900 Constipation, unspecified: Secondary | ICD-10-CM

## 2014-04-02 DIAGNOSIS — I10 Essential (primary) hypertension: Secondary | ICD-10-CM

## 2014-04-02 DIAGNOSIS — J0100 Acute maxillary sinusitis, unspecified: Secondary | ICD-10-CM | POA: Diagnosis not present

## 2014-04-02 LAB — BASIC METABOLIC PANEL
BUN: 27 mg/dL — ABNORMAL HIGH (ref 6–23)
CO2: 24 mEq/L (ref 19–32)
Calcium: 8.9 mg/dL (ref 8.4–10.5)
Chloride: 105 mEq/L (ref 96–112)
Creat: 1.48 mg/dL — ABNORMAL HIGH (ref 0.50–1.10)
Glucose, Bld: 112 mg/dL — ABNORMAL HIGH (ref 70–99)
Potassium: 3.9 mEq/L (ref 3.5–5.3)
Sodium: 142 mEq/L (ref 135–145)

## 2014-04-02 MED ORDER — ATENOLOL 50 MG PO TABS: 75.0000 mg | ORAL_TABLET | Freq: Every day | ORAL | Status: DC

## 2014-04-02 MED ORDER — DOCUSATE SODIUM 100 MG PO CAPS: 100.0000 mg | ORAL_CAPSULE | Freq: Two times a day (BID) | ORAL | Status: DC

## 2014-04-02 MED ORDER — AMOXICILLIN-POT CLAVULANATE 875-125 MG PO TABS: 1.0000 | ORAL_TABLET | Freq: Two times a day (BID) | ORAL | Status: DC

## 2014-04-02 NOTE — Assessment & Plan Note (Signed)
Clinically consistent with acute bacterial maxillary sinusitis (R>L), last seen 02/11/15 thought to be likely viral URI vs allergic rhinitis with short duration, now with significant worsening and "second sickening", failure of previous Amoxicillin treatment. - today afebrile, but tachycardic (Afib) 112 (recent elevated BPs and HR), no focal findings on resp exam  Plan: 1. Augmentin 875-125mg  BID x 10 days 2. Continue symptomatic treatment PRN 3. RTC 2-4 weeks re-eval or sooner if treatment not improving, would recommend trial on alternative agent such as Levaquin 500mg  daily for 7-10 days

## 2014-04-02 NOTE — Assessment & Plan Note (Signed)
Improved, now returning constipation off of Miralax. Inc risk for constipation with chronic opiate use - Last BM 2 days ago (incomplete BMs)  Plan: 1. Rx Colace 100mg  BID for stool softener 2. Resume Miralax 17g daily up to BID x 1-2 weeks, use PRN 3. Recommend inc fiber and hydration

## 2014-04-02 NOTE — Patient Instructions (Addendum)
Dear [name], Thank you for coming in to clinic today. It was good to see you!  1. Your blood pressure and heart rate seem improved today. It is very important to try to eat regular meals and stay hydrated (dehydration can raise your heart rate). Try meal supplement like protein Ensure shake if not hungry. 2. Sent new prescription for Atenolol 75mg  (1 and half tablet) daily - don't miss doses or this will increase heart rate. Sorry for any confusion. 3. Continue Benazepril as prescribed. 4. For Sinus Infection - take Augmentin 1 tablet twice daily for 10 days (this is likely increasing your BP and Heart Rate) 5. Take Colace 100mg  nightly for stool softener for next 2 weeks. If needed may resume Miralax up to twice daily (every 12 hours) for 1 week, then can go back to daily as needed.  Ordered labs today - we will call you with results if abnormal, otherwise mail results to you (can discuss at follow-up).  Some important numbers from today's visit: BP - 120/74 and Heart rate 112 Results -  If you develop significant worsening symptoms with weakness, dizziness, feel like going to pass out, or chest pain, shortness of breath please go directly to Emergency Department for evaluation if not improving, otherwise if some symptoms you may call or come to clinic to be checked out.  Call Cardiology / Coumadin Clinic - tell them you were started on Augmentin antibiotic, see if you can get checked in 1 week. Try to schedule Cardiology follow-up sooner than March.  Please schedule a follow-up appointment with Dr. Parks Ranger within 2-4 weeks to follow-up.  If you have any other questions or concerns, please feel free to call the clinic to contact me. You may also schedule an earlier appointment if necessary.  However, if your symptoms get significantly worse, please go to the Emergency Department to seek immediate medical attention.  [name], [place]

## 2014-04-02 NOTE — Assessment & Plan Note (Signed)
Well controlled today, 120/74. Recent history reported significantly elevated BP on electric arm cuff at home (200s/100s) - Confusion recently over if still taking Atenolol (previously held due to hypotension and weakness) and pt denied taking it, now in office states still taking and out for few days  Plan: 1. Refilled Atenolol 75mg  daily (50mg  tabs, take 1.5 daily) 2. Continue Benazepril 20mg  daily 3. Recommend bringing home cuff to office to check calibration in future 4. RTC 1-3 months, f/u with Cards

## 2014-04-02 NOTE — Telephone Encounter (Signed)
Reviewed telephone note on 04/01/14. Agree with plan and will see patient today 04/02/14 at 2:30pm.  [name], [place], PGY-2

## 2014-04-02 NOTE — Progress Notes (Signed)
   Subjective:    Patient ID: [name], female    DOB: 12-20-1929, 81 y.o.   MRN: [id number]  HPI  CHRONIC HTN: Reports recent symptoms started with elevated BP about 2 weeks ago, with ranging from highest 200s/100s. Called [facility] on 03/31/14 with these concerns, advised to f/u today Current Meds - Benazepril 20mg  daily, Atenolol 75mg  daily   Reports good compliance, took Benazepril today (out Atenolol). Tolerating well, w/o complaints.  RAPID HEART RATE / ATRIAL FIBRILLATION: - Chronic h/o AFib, currently on chronic anticoagulation with Coumadin per Cardiology - Previously rate controlled on Atenolol 75mg  daily (1.5 of 50mg  tablets), however this medication was not recently refilled due to previous concerns regarding hypotensive episodes, weakness, and dizziness, eventually decreased down to single anti-HTN agent with Benazepril. However, there was some confusion regarding this medication as patient was still taking it, but out of refills x 1 month, had not taken Atenolol every day (missed a few doses over past 1-2 weeks) - Presents today with complaint of symptoms of rapid heart rate intermittently for past 2 weeks, with HR up as high as 150s (per home electronic BP arm cuff) with mostly 100 to 120s, associated with and feeling "jittery and weak". Improved with rest. - Denies any chest pain, shortness of breath, pre-syncope / syncope, HAs, lightheadedness, dizziness, falls, vomiting  SINUS INFECTION: - Additionally complains of sinus pressure / congestion, headaches, thick purulent nasal discharge without improvement and seems to be worsening, initially dx and treated at Urgent Care mid December 2015. Given Amoxicillin 10 days. - Admits chills, decreased appetite, nausea - Denies fevers, congestion in chest or productive cough  CONSTIPATION: - Previous complaint since last visit in December. Tried Miralax daily x 1 week with some loose stools, then discontinued. Did not try OTC Colace.  Now with some return constipation, small stools every few days, last BM 2 days ago, feels "incomplete". - Denies abdominal pain, bloody stools  I have reviewed and updated the following as appropriate: allergies and current medications  Social Hx: - Never smoker  Review of Systems  See above HPI    Objective:   Physical Exam  BP 120/74 mmHg  Pulse 112  Temp(Src) 97.9 F (36.6 C) (Oral)  Ht 5\' 4"  (1.626 m)  Wt 133 lb (60.328 kg)  BMI 22.82 kg/m2  Gen - chronically ill-appearing elderly F who seems mostly well today but uncomfortable with significant sinus congestion, cooperative and conversational, NAD HEENT - NCAT moderate R>L maxillary sinus tenderness to palpation (none frontal), PERRL, EOMI, b/l edematous nasal turbinates with moderate thick congestion, o/p clear and symmetrical, MMM Neck - supple, non-tender, no LAD Heart - irregularly irregular rate, stable +2/6 SEM +R-sternal border with radiation to carotids Lungs - CTAB, no focal crackles, or rhonchi. Normal work of breathing. Speaks full sentences. Abd - soft, NTND, no masses, +active BS Ext - non-tender, mild stable b/l lower ext edema Skin - dry, warm Neuro - awake, alert, grossly non-focal     Assessment & Plan:   See specific A&P problem list for details.

## 2014-04-02 NOTE — Assessment & Plan Note (Signed)
Recent worsening rapid HR x 2 weeks (per home BP cuff readings 100-120s with peak 150), clinically consistent with AFib today in office on exam, with HR 112. Suspect potential trigger may be acute bacterial sinusitis and missed doses of Atenolol - Currently on chronic anticoagulation with Coumadin per Cards clinic - Previously on Atenolol 75mg  daily for rate control, however some confusion regarding if she was still taking this medication after previously advised to hold d/t hypotension and weakness  Plan: 1. Refilled Atenolol 75mg  daily (50mg  tabs take 1.5 daily) - BPs seem stable and recently significantly elevated, recommend to continue for rate control 2. Continue coumadin per cards 3. Treat likely underlying trigger sinusitis 4. RTC 1 month f/u, advised to f/u with Cardiology

## 2014-04-03 ENCOUNTER — Telehealth: Payer: Self-pay | Admitting: *Deleted

## 2014-04-03 LAB — TSH: TSH: 1.348 u[IU]/mL (ref 0.350–4.500)

## 2014-04-03 NOTE — Telephone Encounter (Signed)
Spoke to pt regarding recent AFL episodes. Patient states that she has noticed and increase in her HR x 1 week. She denies SOB, edema, fatigue, or CP, but admits to a "quivery" feeling. Her PCP asked her to schedule an appt w/[facility] first available. Will defer scheduling to [place].

## 2014-04-06 ENCOUNTER — Telehealth: Payer: Self-pay | Admitting: Family Medicine

## 2014-04-06 NOTE — Telephone Encounter (Signed)
Called patient to discuss recent results from last OV on 04/02/14. BMET unremarkable except persistent elevated Cr to 1.48 (from 1.40, previously about 1.2), also TSH (normal). Spoke with patient's daughter [name], who stated that patient has been taking Lasix 20mg  PO fairly regularly even when she had no evidence of fluid or wt gain, and I advised her to try to reduce frequency of this to only PRN, and avoid daily use, may always use if acute worsening or noticeable weight gain. Avoid NSAIDs. Elevated creatinine may represent new rise in baseline, unclear at this time.  I recommended having labs re-checked when she follows up with Cardiology.  Additionally, last office visit complaint of tachycardia with measured HR 112 in office but reported at home (finger pulse ox HR check) of 120 to 150s intermittently, known AFib, taking Atenolol 75mg  daily (recently refilled, previously had missed some doses). Most likely thought this was due to recent dx acute sinusitis, treated with Augmentin x 10 days with some improvement. I had advised her at the last OV to call Cardiology office to see if she could get worked in sooner for concern with tachycardia, she has continued to have these episodes per daughter's report today, however denies any CP, SOB, or any active symptoms, concerned about "anxiety" contributing to the elevated HR. She is currently waiting to hear back from Cardiology regarding a work-in appointment.  I advised her that if symptoms persisted, or associated with any new symptoms worsening CP, SOB, HA, weakness, fatigue, syncope she would need to seek immediate medical attention in ED if worsening symptoms and unable to follow-up in clinic.  [name], [place], PGY-2

## 2014-04-07 ENCOUNTER — Telehealth: Payer: Self-pay | Admitting: *Deleted

## 2014-04-07 DIAGNOSIS — I4891 Unspecified atrial fibrillation: Secondary | ICD-10-CM

## 2014-04-07 MED ORDER — ATENOLOL 100 MG PO TABS: 100.0000 mg | ORAL_TABLET | Freq: Every day | ORAL | Status: DC

## 2014-04-07 NOTE — Telephone Encounter (Signed)
Dr. Loletha Grayer review pacer interrogation which showed high rates during AT/AF.  Called patient to verify atenolol dose.  She is taking 75mg  daily - instructed to increase to 100mg  daily.  Voiced understanding.  New Rx sent to pharmacy.

## 2014-04-14 ENCOUNTER — Other Ambulatory Visit: Payer: Self-pay | Admitting: Family Medicine

## 2014-04-14 ENCOUNTER — Encounter: Payer: Self-pay | Admitting: Cardiovascular Disease

## 2014-04-14 DIAGNOSIS — E119 Type 2 diabetes mellitus without complications: Secondary | ICD-10-CM

## 2014-04-24 ENCOUNTER — Encounter: Payer: Self-pay | Admitting: Cardiovascular Disease

## 2014-04-25 ENCOUNTER — Telehealth: Payer: Self-pay | Admitting: Family Medicine

## 2014-04-25 NOTE — Telephone Encounter (Signed)
Paged to [facility] Emergency Line at approx 2300 by Vevelyn Francois, daughter of patient Kalanie Yoss. Well known to me as PCP. Last OV at [facility] 04/02/14. She reports that this evening Shelva had not gone to bed yet and came into the room and found Clarene Critchley saying that she had another "spell" described as "upset stomach with nausea" associated with feeling racing heart beat, anxiety with some crying, and some dizziness. She reports checked BP at that time at 180/110 with HR 118. Denies any chest pain, shortness of breath, HA, syncope / fall, vomiting. She admits to poor PO over past 3 weeks, decreased appetite.  Additionally, Clarene Critchley reports that she had called the Cardiology office on 04/07/14 for concerns with recent elevated HR (over past 1-2 months), Dr. Sallyanne Kuster reviewed her pacer interrogation showing high HR during atrial tachy / AFib, they were notified to increase Atenolol dose from 75mg  to 100mg  daily. With this recent she has noticed some improvement in lower HRs but still occasionally gets elevations. They have a Cardiology appointment scheduled for 05/06/14, as I had requested that they follow-up as soon as possible at our last visit. Clarene Critchley adds that she believes there is some component of anxiety provoking her symptoms, as her mother constantly reports her worries about her heart rate.  Over course of a few minutes during phone call, Jailah seemed to be feeling better by report. Given the above symptoms, I advised them that there could be multiple triggers for her worsening symptoms, but most likely this is due to her known AFib, and suspect stress, poor PO, mild dehydration could all be playing a role. I advised her to rest for another 30 min, eat a snack or small meal (had not eaten tonight) and drink some water. Re-check BP and call back with results. If all improving, then she could remain at home to rest and monitor course, otherwise if persistent or any worsening or new concerning symptoms with  chest pain, dyspnea, she would need to call EMS to be seen in ED immediately.  I called back about 30-45 min later, as I had not heard back. Spoke to [place], who reported that Jalaine was feeling "much better", she ate a whole sandwich, drank a glass of water. Repeat BP 163/93, HR remained 110s. She thinks that her symptoms are related to poor PO and at times "starving herself". As I had previously discussed at last OV, encouraged trying to eat regular small meals, hydration, and encouraged to try ensure or daily protein meal supplement, given my concerns for poor PO and dehydration contributing to elevated HR and dizziness / weakness. She plans to try Ensure tomorrow. Will call back if worsening symptoms. Follow-up as needed.  [name], [place], PGY-2

## 2014-04-27 ENCOUNTER — Telehealth: Payer: Self-pay | Admitting: Cardiovascular Disease

## 2014-04-27 DIAGNOSIS — Z7901 Long term (current) use of anticoagulants: Secondary | ICD-10-CM | POA: Diagnosis not present

## 2014-04-27 LAB — PROTIME-INR: INR: 4.4 — AB (ref <=1.1)

## 2014-04-27 NOTE — Telephone Encounter (Signed)
Pt's daughter is calling in to see if the pt can be seen sooner because she is having a rapid heartbeat. Her appt was cancelled due to Dr. Loletha Grayer having RIE's that week. Please f/u  THanks

## 2014-04-28 NOTE — Telephone Encounter (Signed)
If she needs to be seen sooner you will have to schedule with an extender.  If no one is available here schedule at [facility].. Thanks

## 2014-04-29 ENCOUNTER — Ambulatory Visit (INDEPENDENT_AMBULATORY_CARE_PROVIDER_SITE_OTHER): Payer: Self-pay | Admitting: Pharmacist Clinician (PhC)/ Clinical Pharmacy Specialist

## 2014-04-29 DIAGNOSIS — I4891 Unspecified atrial fibrillation: Secondary | ICD-10-CM

## 2014-05-01 ENCOUNTER — Encounter: Payer: Self-pay | Admitting: Physician Assistant

## 2014-05-01 ENCOUNTER — Ambulatory Visit (INDEPENDENT_AMBULATORY_CARE_PROVIDER_SITE_OTHER): Payer: Medicare Other | Admitting: *Deleted

## 2014-05-01 ENCOUNTER — Ambulatory Visit (INDEPENDENT_AMBULATORY_CARE_PROVIDER_SITE_OTHER): Payer: Medicare Other | Admitting: Physician Assistant

## 2014-05-01 VITALS — BP 150/90 | HR 120 | Ht 64.0 in | Wt 140.4 lb

## 2014-05-01 DIAGNOSIS — I4892 Unspecified atrial flutter: Secondary | ICD-10-CM

## 2014-05-01 DIAGNOSIS — I4891 Unspecified atrial fibrillation: Secondary | ICD-10-CM

## 2014-05-01 DIAGNOSIS — I483 Typical atrial flutter: Secondary | ICD-10-CM | POA: Insufficient documentation

## 2014-05-01 DIAGNOSIS — Z01812 Encounter for preprocedural laboratory examination: Secondary | ICD-10-CM

## 2014-05-01 DIAGNOSIS — Z7901 Long term (current) use of anticoagulants: Secondary | ICD-10-CM

## 2014-05-01 DIAGNOSIS — I10 Essential (primary) hypertension: Secondary | ICD-10-CM

## 2014-05-01 DIAGNOSIS — Z950 Presence of cardiac pacemaker: Secondary | ICD-10-CM

## 2014-05-01 MED ORDER — DILTIAZEM HCL ER COATED BEADS 120 MG PO CP24: 120.0000 mg | ORAL_CAPSULE | Freq: Every day | ORAL | Status: DC

## 2014-05-01 NOTE — Assessment & Plan Note (Signed)
INR is been therapeutic for the last 4 weeks.

## 2014-05-01 NOTE — Progress Notes (Signed)
Patient ID: Makai C Lieder, female   DOB: 07/19/1929, 81 y.o.   MRN: [id number]    Date:  05/01/2014   ID:  Elfie C Berti, DOB 12/17/1929, MRN [id number]  PCP:  Karamalegos, Alexander, DO  Primary Cardiologist:  Croitoru  Chief Complaint  Patient presents with  . Shortness of Breath    started 3 days ago     History of Present Illness: Joanny C Cork is a 81 y.o. female who is status post aortic valve replacement, sinus node dysfunction status post pacemaker, paroxysmal atrial fibrillation, peripheral venous insufficiency    Patient was last seen in the office July 2015 at which time Mrs.Herda complained of frequent episodes of weakness and lightheadedness and had often recorded SBP of 80-85 mm Hg. She had not experienced syncope. She did not have edema or dyspnea. She takes a very low dose of loop diuretic. Her appetite was poor and she believed she has lost weight (although our office weight was the same 6 months ago).  Pacemaker check at that time showed normal device function. 97% A, paced 2% V paced. A single 6-beat run of NSVT, no atrial fib, 13 episodes of brief atrial tachycardia/flutter 248 bpm (up to 14 seconds).  In 2004 she underwent replacement of her aortic valve with a 21 mm biological prosthesis by Dr. Gerhardt.  In 2005 she received a dual-chamber permanent pacemaker and had a generator change in 2014 (St. Jude accident DR RF device). Her atrial lead has slightly elevated pacing thresholds (2.25 V at 0.5 ms , 1.5@1.0 ms)) but her device otherwise functions normally. She has very infrequent episodes of brief paroxysmal atrial tachycardia. She does have a history of atrial fibrillation for which he takes warfarin. This is monitored by our Coumadin clinic but she has a blood drawn in [place] at the urgent care. She has severe hypercholesterolemia requiring simultaneous treatment with Crestor and Zetia but by coronary angiography before valve replacement shown he had a  40% LAD stenosis without other meaningful coronary disease. She has preserved left ventricular systolic function with an echo was recently performed in March of this year. Prosthetic valve function was normal at that time.   Patient presents today with complaints of a rapid heartbeat, feeling weak and fatigued.. EKG and interrogation of her St. Jude pacer confirms atrial flutter.  She otherwise denies nausea, vomiting, fever, chest pain, shortness of breath, orthopnea, dizziness, PND, cough, congestion, abdominal pain, hematochezia, melena, lower extremity edema.  Wt Readings from Last 3 Encounters:  05/01/14 140 lb 6.4 oz (63.685 kg)  04/02/14 133 lb (60.328 kg)  02/10/14 135 lb 3.2 oz (61.326 kg)     Past Medical History  Diagnosis Date  . PAF (paroxysmal atrial fibrillation)   . HTN (hypertension)   . GERD (gastroesophageal reflux disease)   . Anxiety   . CAD (coronary artery disease)   . CHF (congestive heart failure)     EF 55% on 01/05/11, trace AR w/ Bioprosthetic valve  . Tobacco abuse   . DM2 (diabetes mellitus, type 2)   . Restless legs syndrome (RLS)   . HLD (hyperlipidemia)   . Anemia     Current Outpatient Prescriptions  Medication Sig Dispense Refill  . ALPRAZolam (XANAX) 1 MG tablet Take 1 tablet (1 mg total) by mouth 2 (two) times daily as needed for anxiety. TAKE 1 TABLET BY MOUTH 2 TIMES DAILY AS NEEDED FOR ANXIETY 60 tablet 3  . atenolol (TENORMIN) 100 MG tablet Take 1 tablet (100 mg   total) by mouth daily. 30 tablet 6  . benazepril (LOTENSIN) 20 MG tablet Take 1 tablet (20 mg total) by mouth daily. 30 tablet 11  . Blood Glucose Monitoring Suppl (ONE TOUCH ULTRA 2) W/DEVICE KIT   0  . CRESTOR 10 MG tablet TAKE 1 TABLET (10 MG TOTAL) BY MOUTH DAILY. 90 tablet 2  . ezetimibe (ZETIA) 10 MG tablet Take 1 tablet (10 mg total) by mouth daily. 90 tablet 3  . fluticasone (FLONASE) 50 MCG/ACT nasal spray Place 2 sprays into both nostrils daily. 16 g 0  . furosemide (LASIX)  20 MG tablet TAKE 1 TABLET BY MOUTH EVERY DAY 30 tablet 6  . HYDROcodone-acetaminophen (NORCO) 7.5-325 MG per tablet Take 1 tablet by mouth 2 (two) times daily as needed. 60 tablet 0  . loratadine (CLARITIN) 10 MG tablet TAKE 1 TABLET (10 MG TOTAL) BY MOUTH DAILY. 30 tablet 2  . metFORMIN (GLUCOPHAGE) 1000 MG tablet TAKE 1 TABLET BY MOUTH TWICE A DAY WITH A MEAL 60 tablet 5  . Omega-3 Fatty Acids (FISH OIL) 1000 MG CAPS 2 caps by mouth daily     . omeprazole (PRILOSEC) 20 MG capsule Take 1 capsule (20 mg total) by mouth daily. 90 capsule 3  . ONE TOUCH ULTRA TEST test strip   11  . warfarin (COUMADIN) 5 MG tablet Take 1 to 1.5 tablets by mouth daily as directed by coumadin clinic 50 tablet 5  . diltiazem (CARDIZEM CD) 120 MG 24 hr capsule Take 1 capsule (120 mg total) by mouth daily. 30 capsule 6   No current facility-administered medications for this visit.    Allergies:    Allergies  Allergen Reactions  . Pravachol [Pravastatin Sodium]     myalgias    Social History:  The patient  reports that she has never smoked. Her smokeless tobacco use includes Snuff. She reports that she does not drink alcohol or use illicit drugs.   Family history:   Family History  Problem Relation Age of Onset  . Heart attack Brother   . Kidney disease Sister     ROS:  Please see the history of present illness.  All other systems reviewed and negative.   PHYSICAL EXAM: VS:  BP 150/90 mmHg  Pulse 120  Ht 5' 4" (1.626 m)  Wt 140 lb 6.4 oz (63.685 kg)  BMI 24.09 kg/m2 Well nourished, well developed, in no acute distress HEENT: Pupils are equal round react to light accommodation extraocular movements are intact.  Neck: no JVDNo cervical lymphadenopathy. Cardiac: irreg rate and rhythm.  Rate elevated. 1/6 sys MM-apex   Abd: soft, nontender, positive bowel sounds all quadrants, no hepatosplenomegaly Ext: no lower extremity edema.  2+ radial and dorsalis pedis pulses. Skin: warm and dry Neuro:  Grossly  normal  EKG:  Aflutter 2:1  ASSESSMENT AND PLAN:  Problem List Items Addressed This Visit    Pacemaker    Patient's atrial pacing 71% of the time ventricular pacing 4.1%. AMS episodes 34, Mode switch 15%, AT/AF burden 12%      Long term current use of anticoagulant therapy    INR is been therapeutic for the last 4 weeks.      Essential hypertension, benign    Blood pressure is elevated. We are adding Cardizem to help with heart rate that should help blood pressure is well      Relevant Medications   diltiazem (CARDIZEM CD) 24 hr capsule   Atrial flutter with rapid ventricular response      Overdrive pacing was tried in the clinic without success. We will add Cardizem 120 mg daily. Patient be scheduled for DC CV next week at the next available appointment.      Relevant Medications   diltiazem (CARDIZEM CD) 24 hr capsule   Atrial fibrillation - Primary   Relevant Medications   diltiazem (CARDIZEM CD) 24 hr capsule   Other Relevant Orders   EKG 12-Lead   ELECTRICAL CARDIOVERSION    Other Visit Diagnoses    Pre-procedure lab exam        Relevant Orders    CBC    APTT    Basic metabolic panel    Protime-INR    DG Chest 2 View          

## 2014-05-01 NOTE — Assessment & Plan Note (Signed)
Patient's atrial pacing 71% of the time ventricular pacing 4.1%. AMS episodes 34, Mode switch 15%, AT/AF burden 12%

## 2014-05-01 NOTE — Assessment & Plan Note (Signed)
Blood pressure is elevated. We are adding Cardizem to help with heart rate that should help blood pressure is well

## 2014-05-01 NOTE — Patient Instructions (Signed)
A chest x-ray takes a picture of the organs and structures inside the chest, including the heart, lungs, and blood vessels. This test can show several things, including, whether the heart is enlarges; whether fluid is building up in the lungs; and whether pacemaker / defibrillator leads are still in place. This will be done at [address] In the [facility] building.  Your physician has recommended that you have a Cardioversion (DCCV). Electrical Cardioversion uses a jolt of electricity to your heart either through paddles or wired patches attached to your chest. This is a controlled, usually prescheduled, procedure. Defibrillation is done under light anesthesia in the hospital, and you usually go home the day of the procedure. This is done to get your heart back into a normal rhythm. You are not awake for the procedure. Please see the instruction sheet given to you today.  Your physician recommends that you return for lab work in: 2 days prior to your procedure.  Your physician recommends that you schedule a follow-up appointment in: 2 weeks following the procedure. this appointment will be scheduled when being discharged from the hospital.  Your physician has recommended you make the following change in your medication: start new prescription for diltiazem. this has already been sent to your pharmacy.

## 2014-05-01 NOTE — Assessment & Plan Note (Signed)
Overdrive pacing was tried in the clinic without success. We will add Cardizem 120 mg daily. Patient be scheduled for DC CV next week at the next available appointment.

## 2014-05-02 LAB — MDC_IDC_ENUM_SESS_TYPE_INCLINIC
Battery Remaining Percentage: 95 %
Brady Statistic RA Percent Paced: 71 %
Brady Statistic RV Percent Paced: 4.1 %
Implantable Pulse Generator Serial Number: 7445049
Lead Channel Impedance Value: 250 Ohm
Lead Channel Sensing Intrinsic Amplitude: 7 mV
Lead Channel Setting Pacing Pulse Width: 1 ms
Lead Channel Setting Sensing Sensitivity: 1 mV
MDC IDC MSMT BATTERY VOLTAGE: 2.98 V
MDC IDC MSMT LEADCHNL RA IMPEDANCE VALUE: 360 Ohm
MDC IDC SET LEADCHNL RA PACING AMPLITUDE: 2.25 V
MDC IDC SET LEADCHNL RV PACING AMPLITUDE: 3 V

## 2014-05-02 NOTE — Progress Notes (Signed)
Pacemaker check in clinic w/Bryan Iola, [place]. Patient in [facility] since 03/27/14 with uncontrolled ventricular rate. Attempted A.Nips performed in office without successful termination.  Patient will follow up with [facility] after DCCV.

## 2014-05-04 ENCOUNTER — Ambulatory Visit
Admission: RE | Admit: 2014-05-04 | Discharge: 2014-05-04 | Disposition: A | Discharge status: Home / Self Care | Payer: Medicare Other | Source: Ambulatory Visit | Attending: Physician Assistant | Admitting: Physician Assistant

## 2014-05-04 DIAGNOSIS — Z01812 Encounter for preprocedural laboratory examination: Secondary | ICD-10-CM

## 2014-05-04 DIAGNOSIS — J90 Pleural effusion, not elsewhere classified: Secondary | ICD-10-CM | POA: Diagnosis not present

## 2014-05-04 DIAGNOSIS — I517 Cardiomegaly: Secondary | ICD-10-CM | POA: Diagnosis not present

## 2014-05-04 DIAGNOSIS — Z01818 Encounter for other preprocedural examination: Secondary | ICD-10-CM | POA: Diagnosis not present

## 2014-05-05 ENCOUNTER — Encounter (HOSPITAL_COMMUNITY)
Admission: RE | Disposition: A | Discharge status: Home / Self Care | Payer: Medicare Other | Source: Ambulatory Visit | Attending: Cardiology

## 2014-05-05 ENCOUNTER — Ambulatory Visit (HOSPITAL_COMMUNITY): Payer: Medicare Other | Admitting: Certified Registered Nurse Anesthetist

## 2014-05-05 ENCOUNTER — Ambulatory Visit (HOSPITAL_COMMUNITY)
Admission: RE | Admit: 2014-05-05 | Discharge: 2014-05-05 | Disposition: A | Discharge status: Home / Self Care | Payer: Medicare Other | Source: Ambulatory Visit | Attending: Cardiology | Admitting: Cardiology

## 2014-05-05 ENCOUNTER — Encounter (HOSPITAL_COMMUNITY): Payer: Self-pay | Admitting: Certified Registered Nurse Anesthetist

## 2014-05-05 DIAGNOSIS — Z950 Presence of cardiac pacemaker: Secondary | ICD-10-CM | POA: Diagnosis not present

## 2014-05-05 DIAGNOSIS — I509 Heart failure, unspecified: Secondary | ICD-10-CM | POA: Insufficient documentation

## 2014-05-05 DIAGNOSIS — K219 Gastro-esophageal reflux disease without esophagitis: Secondary | ICD-10-CM | POA: Diagnosis not present

## 2014-05-05 DIAGNOSIS — F17290 Nicotine dependence, other tobacco product, uncomplicated: Secondary | ICD-10-CM | POA: Insufficient documentation

## 2014-05-05 DIAGNOSIS — R0602 Shortness of breath: Secondary | ICD-10-CM | POA: Diagnosis not present

## 2014-05-05 DIAGNOSIS — F419 Anxiety disorder, unspecified: Secondary | ICD-10-CM | POA: Insufficient documentation

## 2014-05-05 DIAGNOSIS — I10 Essential (primary) hypertension: Secondary | ICD-10-CM | POA: Diagnosis not present

## 2014-05-05 DIAGNOSIS — G2581 Restless legs syndrome: Secondary | ICD-10-CM | POA: Diagnosis not present

## 2014-05-05 DIAGNOSIS — M1990 Unspecified osteoarthritis, unspecified site: Secondary | ICD-10-CM | POA: Diagnosis not present

## 2014-05-05 DIAGNOSIS — E119 Type 2 diabetes mellitus without complications: Secondary | ICD-10-CM | POA: Insufficient documentation

## 2014-05-05 DIAGNOSIS — Z7901 Long term (current) use of anticoagulants: Secondary | ICD-10-CM | POA: Diagnosis not present

## 2014-05-05 DIAGNOSIS — I4892 Unspecified atrial flutter: Secondary | ICD-10-CM | POA: Insufficient documentation

## 2014-05-05 DIAGNOSIS — I2510 Atherosclerotic heart disease of native coronary artery without angina pectoris: Secondary | ICD-10-CM | POA: Insufficient documentation

## 2014-05-05 DIAGNOSIS — Z79899 Other long term (current) drug therapy: Secondary | ICD-10-CM | POA: Diagnosis not present

## 2014-05-05 DIAGNOSIS — I4891 Unspecified atrial fibrillation: Secondary | ICD-10-CM | POA: Diagnosis not present

## 2014-05-05 DIAGNOSIS — Z952 Presence of prosthetic heart valve: Secondary | ICD-10-CM | POA: Diagnosis not present

## 2014-05-05 DIAGNOSIS — Z7951 Long term (current) use of inhaled steroids: Secondary | ICD-10-CM | POA: Diagnosis not present

## 2014-05-05 DIAGNOSIS — E785 Hyperlipidemia, unspecified: Secondary | ICD-10-CM | POA: Insufficient documentation

## 2014-05-05 DIAGNOSIS — Z79891 Long term (current) use of opiate analgesic: Secondary | ICD-10-CM | POA: Diagnosis not present

## 2014-05-05 DIAGNOSIS — I480 Paroxysmal atrial fibrillation: Secondary | ICD-10-CM | POA: Insufficient documentation

## 2014-05-05 HISTORY — PX: CARDIOVERSION: SHX1299

## 2014-05-05 LAB — CBC
HCT: 30.1 % — ABNORMAL LOW (ref 36.0–46.0)
Hemoglobin: 9 g/dL — ABNORMAL LOW (ref 12.0–15.0)
MCH: 23.3 pg — ABNORMAL LOW (ref 26.0–34.0)
MCHC: 29.9 g/dL — ABNORMAL LOW (ref 30.0–36.0)
MCV: 77.8 fL — AB (ref 78.0–100.0)
MPV: 9.9 fL (ref 8.6–12.4)
PLATELETS: 309 10*3/uL (ref 150–400)
RBC: 3.87 MIL/uL (ref 3.87–5.11)
RDW: 17.6 % — AB (ref 11.5–15.5)
WBC: 9.6 10*3/uL (ref 4.0–10.5)

## 2014-05-05 LAB — BASIC METABOLIC PANEL
BUN: 26 mg/dL — AB (ref 6–23)
CO2: 21 mEq/L (ref 19–32)
CREATININE: 1.49 mg/dL — AB (ref 0.50–1.10)
Calcium: 9.3 mg/dL (ref 8.4–10.5)
Chloride: 106 mEq/L (ref 96–112)
Glucose, Bld: 135 mg/dL — ABNORMAL HIGH (ref 70–99)
Potassium: 4.3 mEq/L (ref 3.5–5.3)
Sodium: 141 mEq/L (ref 135–145)

## 2014-05-05 LAB — PROTIME-INR
INR: 2.53 — AB (ref <1.50)
PROTHROMBIN TIME: 27.3 s — AB (ref 11.6–15.2)

## 2014-05-05 LAB — GLUCOSE, CAPILLARY: Glucose-Capillary: 132 mg/dL — ABNORMAL HIGH (ref 70–99)

## 2014-05-05 LAB — APTT: aPTT: 34 seconds (ref 24–37)

## 2014-05-05 SURGERY — CARDIOVERSION
Anesthesia: General

## 2014-05-05 MED ORDER — LIDOCAINE HCL (CARDIAC) 20 MG/ML IV SOLN
INTRAVENOUS | Status: DC | PRN
  Administered 2014-05-05: 40 mg via INTRAVENOUS

## 2014-05-05 MED ORDER — SODIUM CHLORIDE 0.9 % IV SOLN
INTRAVENOUS | Status: DC
  Administered 2014-05-05: 500 mL via INTRAVENOUS
  Administered 2014-05-05: 10:00:00 via INTRAVENOUS

## 2014-05-05 MED ORDER — PROPOFOL 10 MG/ML IV BOLUS
INTRAVENOUS | Status: DC | PRN
  Administered 2014-05-05: 30 mg via INTRAVENOUS

## 2014-05-05 MED ORDER — HYDROCORTISONE 1 % EX OINT
TOPICAL_OINTMENT | Freq: Three times a day (TID) | CUTANEOUS | Status: DC
  Administered 2014-05-05: 11:00:00 via TOPICAL
  Filled 2014-05-05: qty 28.35

## 2014-05-05 NOTE — Transfer of Care (Signed)
Immediate Anesthesia Transfer of Care Note  Patient: [name]  Procedure(s) Performed: Procedure(s): CARDIOVERSION (N/A)  Patient Location: PACU and Endoscopy Unit  Anesthesia Type:General  Level of Consciousness: awake and alert   Airway & Oxygen Therapy: Patient Spontanous Breathing and Patient connected to nasal cannula oxygen  Post-op Assessment: Report given to RN and Post -op Vital signs reviewed and stable  Post vital signs: Reviewed and stable  Last Vitals:  Filed Vitals:   05/05/14 0839  BP: 131/71  Temp: 36.4 C  Resp: 23    Complications: No apparent anesthesia complications
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
## 2014-05-05 NOTE — Interval H&P Note (Signed)
History and Physical Interval Note:  05/05/2014 8:43 AM  [name]  has presented today for surgery, with the diagnosis of SHORTNESS OF BREATH   The various methods of treatment have been discussed with the patient and family. After consideration of risks, benefits and other options for treatment, the patient has consented to  Procedure(s): CARDIOVERSION (N/A) as a surgical intervention .  The patient's history has been reviewed, patient examined, no change in status, stable for surgery.  I have reviewed the patient's chart and labs.  Questions were answered to the patient's satisfaction.     Dorothy Spark

## 2014-05-05 NOTE — Discharge Instructions (Signed)

## 2014-05-05 NOTE — H&P (View-Only) (Signed)
Patient ID: [name], female   DOB: 1929-08-06, 81 y.o.   MRN: [id number]    Date:  05/01/2014   ID:  [name], DOB Jul 24, 1929, MRN [id number]  PCP:  Lindsey Putnam, DO  Primary Cardiologist:  Croitoru  Chief Complaint  Patient presents with  . Shortness of Breath    started 3 days ago     History of Present Illness: [name] is a 81 y.o. female who is status post aortic valve replacement, sinus node dysfunction status post pacemaker, paroxysmal atrial fibrillation, peripheral venous insufficiency    Patient was last seen in the office July 2015 at which time [name] complained of frequent episodes of weakness and lightheadedness and had often recorded SBP of 80-85 mm Hg. She had not experienced syncope. She did not have edema or dyspnea. She takes a very low dose of loop diuretic. Her appetite was poor and she believed she has lost weight (although our office weight was the same 6 months ago).  Pacemaker check at that time showed normal device function. 97% A, paced 2% V paced. A single 6-beat run of NSVT, no atrial fib, 13 episodes of brief atrial tachycardia/flutter 248 bpm (up to 14 seconds).  In 2004 she underwent replacement of her aortic valve with a 21 mm biological prosthesis by Dr. Servando Snare.  In 2005 she received a dual-chamber permanent pacemaker and had a generator change in 2014 (St. Jude accident DR RF device). Her atrial lead has slightly elevated pacing thresholds (2.25 V at 0.5 ms , 1.5_0 .0 ms)) but her device otherwise functions normally. She has very infrequent episodes of brief paroxysmal atrial tachycardia. She does have a history of atrial fibrillation for which he takes warfarin. This is monitored by our Coumadin clinic but she has a blood drawn in [place] at the urgent care. She has severe hypercholesterolemia requiring simultaneous treatment with Crestor and Zetia but by coronary angiography before valve replacement shown he had a  40% LAD stenosis without other meaningful coronary disease. She has preserved left ventricular systolic function with an echo was recently performed in March of this year. Prosthetic valve function was normal at that time.   Patient presents today with complaints of a rapid heartbeat, feeling weak and fatigued.. EKG and interrogation of her St. Jude pacer confirms atrial flutter.  She otherwise denies nausea, vomiting, fever, chest pain, shortness of breath, orthopnea, dizziness, PND, cough, congestion, abdominal pain, hematochezia, melena, lower extremity edema.  Wt Readings from Last 3 Encounters:  05/01/14 140 lb 6.4 oz (63.685 kg)  04/02/14 133 lb (60.328 kg)  02/10/14 135 lb 3.2 oz (61.326 kg)     Past Medical History  Diagnosis Date  . PAF (paroxysmal atrial fibrillation)   . HTN (hypertension)   . GERD (gastroesophageal reflux disease)   . Anxiety   . CAD (coronary artery disease)   . CHF (congestive heart failure)     EF 55% on 01/05/11, trace AR w/ Bioprosthetic valve  . Tobacco abuse   . DM2 (diabetes mellitus, type 2)   . Restless legs syndrome (RLS)   . HLD (hyperlipidemia)   . Anemia     Current Outpatient Prescriptions  Medication Sig Dispense Refill  . ALPRAZolam (XANAX) 1 MG tablet Take 1 tablet (1 mg total) by mouth 2 (two) times daily as needed for anxiety. TAKE 1 TABLET BY MOUTH 2 TIMES DAILY AS NEEDED FOR ANXIETY 60 tablet 3  . atenolol (TENORMIN) 100 MG tablet Take 1 tablet (100 mg  total) by mouth daily. 30 tablet 6  . benazepril (LOTENSIN) 20 MG tablet Take 1 tablet (20 mg total) by mouth daily. 30 tablet 11  . Blood Glucose Monitoring Suppl (ONE TOUCH ULTRA 2) W/DEVICE KIT   0  . CRESTOR 10 MG tablet TAKE 1 TABLET (10 MG TOTAL) BY MOUTH DAILY. 90 tablet 2  . ezetimibe (ZETIA) 10 MG tablet Take 1 tablet (10 mg total) by mouth daily. 90 tablet 3  . fluticasone (FLONASE) 50 MCG/ACT nasal spray Place 2 sprays into both nostrils daily. 16 g 0  . furosemide (LASIX)  20 MG tablet TAKE 1 TABLET BY MOUTH EVERY DAY 30 tablet 6  . HYDROcodone-acetaminophen (NORCO) 7.5-325 MG per tablet Take 1 tablet by mouth 2 (two) times daily as needed. 60 tablet 0  . loratadine (CLARITIN) 10 MG tablet TAKE 1 TABLET (10 MG TOTAL) BY MOUTH DAILY. 30 tablet 2  . metFORMIN (GLUCOPHAGE) 1000 MG tablet TAKE 1 TABLET BY MOUTH TWICE A DAY WITH A MEAL 60 tablet 5  . Omega-3 Fatty Acids (FISH OIL) 1000 MG CAPS 2 caps by mouth daily     . omeprazole (PRILOSEC) 20 MG capsule Take 1 capsule (20 mg total) by mouth daily. 90 capsule 3  . ONE TOUCH ULTRA TEST test strip   11  . warfarin (COUMADIN) 5 MG tablet Take 1 to 1.5 tablets by mouth daily as directed by coumadin clinic 50 tablet 5  . diltiazem (CARDIZEM CD) 120 MG 24 hr capsule Take 1 capsule (120 mg total) by mouth daily. 30 capsule 6   No current facility-administered medications for this visit.    Allergies:    Allergies  Allergen Reactions  . Pravachol [Pravastatin Sodium]     myalgias    Social History:  The patient  reports that she has never smoked. Her smokeless tobacco use includes Snuff. She reports that she does not drink alcohol or use illicit drugs.   Family history:   Family History  Problem Relation Age of Onset  . Heart attack Brother   . Kidney disease Sister     ROS:  Please see the history of present illness.  All other systems reviewed and negative.   PHYSICAL EXAM: VS:  BP 150/90 mmHg  Pulse 120  Ht _0  (1.626 m)  Wt 140 lb 6.4 oz (63.685 kg)  BMI 24.09 kg/m2 Well nourished, well developed, in no acute distress HEENT: Pupils are equal round react to light accommodation extraocular movements are intact.  Neck: no JVDNo cervical lymphadenopathy. Cardiac: irreg rate and rhythm.  Rate elevated. 1/6 sys MM-apex   Abd: soft, nontender, positive bowel sounds all quadrants, no hepatosplenomegaly Ext: no lower extremity edema.  2+ radial and dorsalis pedis pulses. Skin: warm and dry Neuro:  Grossly  normal  EKG:  Aflutter 2:1  ASSESSMENT AND PLAN:  Problem List Items Addressed This Visit    Pacemaker    Patient's atrial pacing 71% of the time ventricular pacing 4.1%. AMS episodes 34, Mode switch 15%, AT/AF burden 12%      Long term current use of anticoagulant therapy    INR is been therapeutic for the last 4 weeks.      Essential hypertension, benign    Blood pressure is elevated. We are adding Cardizem to help with heart rate that should help blood pressure is well      Relevant Medications   diltiazem (CARDIZEM CD) 24 hr capsule   Atrial flutter with rapid ventricular response  Overdrive pacing was tried in the clinic without success. We will add Cardizem 120 mg daily. Patient be scheduled for DC CV next week at the next available appointment.      Relevant Medications   diltiazem (CARDIZEM CD) 24 hr capsule   Atrial fibrillation - Primary   Relevant Medications   diltiazem (CARDIZEM CD) 24 hr capsule   Other Relevant Orders   EKG 12-Lead   ELECTRICAL CARDIOVERSION    Other Visit Diagnoses    Pre-procedure lab exam        Relevant Orders    CBC    APTT    Basic metabolic panel    Protime-INR    DG Chest 2 View

## 2014-05-05 NOTE — Anesthesia Preprocedure Evaluation (Addendum)
Anesthesia Evaluation  Patient identified by MRN, date of birth, ID band Patient awake    Reviewed: Allergy & Precautions, NPO status , Patient's Chart, lab work & pertinent test results  Airway Mallampati: II  TM Distance: >3 FB Neck ROM: Full    Dental no notable dental hx.    Pulmonary neg pulmonary ROS,  breath sounds clear to auscultation  Pulmonary exam normal       Cardiovascular hypertension, Pt. on medications + CAD and +CHF + dysrhythmias Atrial Fibrillation + pacemaker + Valvular Problems/Murmurs Rhythm:Irregular Rate:Tachycardia  CHF (congestive heart failure)  EF 55% on 01/05/11, trace AR w/ Bioprosthetic valve   AORTIC VALVE REPLACEMENT 08/25/2002      Neuro/Psych negative neurological ROS  negative psych ROS   GI/Hepatic negative GI ROS, Neg liver ROS, GERD-  ,  Endo/Other  diabetes, Type 2, Oral Hypoglycemic Agents  Renal/GU negative Renal ROS  negative genitourinary   Musculoskeletal negative musculoskeletal ROS (+) Arthritis -,   Abdominal   Peds negative pediatric ROS (+)  Hematology negative hematology ROS (+)   Anesthesia Other Findings   Reproductive/Obstetrics negative OB ROS                            Anesthesia Physical Anesthesia Plan  ASA: III  Anesthesia Plan: General   Post-op Pain Management:    Induction: Intravenous  Airway Management Planned: Mask  Additional Equipment:   Intra-op Plan:   Post-operative Plan:   Informed Consent: I have reviewed the patients History and Physical, chart, labs and discussed the procedure including the risks, benefits and alternatives for the proposed anesthesia with the patient or authorized representative who has indicated his/her understanding and acceptance.   Dental advisory given  Plan Discussed with: CRNA, Surgeon and Anesthesiologist  Anesthesia Plan Comments:        Anesthesia Quick  Evaluation

## 2014-05-05 NOTE — Anesthesia Postprocedure Evaluation (Signed)
  Anesthesia Post-op Note  Patient: [name]  Procedure(s) Performed: Procedure(s): CARDIOVERSION (N/A)  Patient Location: PACU  Anesthesia Type:General  Level of Consciousness: awake, alert , oriented and patient cooperative  Airway and Oxygen Therapy: Patient Spontanous Breathing  Post-op Pain: none  Post-op Assessment: Post-op Vital signs reviewed, Patient's Cardiovascular Status Stable, Respiratory Function Stable, Patent Airway, No signs of Nausea or vomiting and Pain level controlled  Post-op Vital Signs: stable  Last Vitals:  Filed Vitals:   05/05/14 1024  BP: 153/64  Pulse: 76  Temp: 36.4 C  Resp: 23    Complications: No apparent anesthesia complications

## 2014-05-06 ENCOUNTER — Encounter: Payer: Medicaid Other | Admitting: Cardiovascular Disease

## 2014-05-06 ENCOUNTER — Encounter (HOSPITAL_COMMUNITY): Payer: Self-pay | Admitting: Cardiology

## 2014-05-07 ENCOUNTER — Telehealth: Payer: Self-pay | Admitting: *Deleted

## 2014-05-07 DIAGNOSIS — D509 Iron deficiency anemia, unspecified: Secondary | ICD-10-CM

## 2014-05-07 NOTE — Telephone Encounter (Signed)
-----   Message from Brett Canales, PA-C sent at 05/06/2014  8:27 AM EST ----- Please ask the patient have an iron panel drawn for microcytic anemia.  Thanks, Gaspar Bidding

## 2014-05-07 NOTE — Telephone Encounter (Signed)
No answer at home number.  Message left on cell # to get lab drawn at [facility] lab.  Order placed.

## 2014-05-08 ENCOUNTER — Telehealth: Payer: Self-pay | Admitting: Cardiovascular Disease

## 2014-05-08 NOTE — Telephone Encounter (Signed)
Pt's daughter called in stating that she would like the pt's lab orders sent to the urgent care in [place] because she stated that is where she normally goes to have her labs drawn. Please f/u with Teresa(daughter).  Thanks

## 2014-05-08 NOTE — Telephone Encounter (Signed)
Returned call to patient's daughter [name].She stated she wanted to have iron panel done ar Urgent Care in [place].Order for iron panel faxed to Urgent Care in [place] at fax # [phone].Advised to fax results to Dr.Croitoru at fax # [phone].

## 2014-05-11 DIAGNOSIS — Z7901 Long term (current) use of anticoagulants: Secondary | ICD-10-CM | POA: Diagnosis not present

## 2014-05-11 DIAGNOSIS — D509 Iron deficiency anemia, unspecified: Secondary | ICD-10-CM | POA: Diagnosis not present

## 2014-05-11 LAB — POCT INR: INR: 5.3

## 2014-05-13 ENCOUNTER — Ambulatory Visit (INDEPENDENT_AMBULATORY_CARE_PROVIDER_SITE_OTHER): Payer: Self-pay | Admitting: Pharmacist Clinician (PhC)/ Clinical Pharmacy Specialist

## 2014-05-13 DIAGNOSIS — I4891 Unspecified atrial fibrillation: Secondary | ICD-10-CM

## 2014-05-13 DIAGNOSIS — Z7901 Long term (current) use of anticoagulants: Secondary | ICD-10-CM

## 2014-05-14 ENCOUNTER — Other Ambulatory Visit: Payer: Self-pay | Admitting: Family Medicine

## 2014-05-14 DIAGNOSIS — K219 Gastro-esophageal reflux disease without esophagitis: Secondary | ICD-10-CM

## 2014-05-17 DIAGNOSIS — Z952 Presence of prosthetic heart valve: Secondary | ICD-10-CM | POA: Insufficient documentation

## 2014-05-17 NOTE — Progress Notes (Deleted)
Cardiology Office Note   Date:  05/17/2014   ID:  [name], DOB Apr 01, 1929, MRN [id number]  PCP:  [name], DO  Cardiologist:  Dr. Sanda Klein     Chief Complaint  Patient presents with  . Atrial Flutter    s/p DCCV     History of Present Illness: [name] is a 80 y.o. female with a hx of aortic stenosis s/p bioprosthetic AVR in 2004 with Dr. Servando Snare, PAF, SSS s/p PPM, HL, non-obstructive CAD by [facility] in 2004, HTN.  Presented to clinic 05/01/14 with AFlutter with RVR.  Overdrive pacing in the clinic was unsuccessful.  Diltiazem 120 mg QD was added and the patient was set up for DCCV 05/05/14.  She returns for FU.  ***   Studies/Reports Reviewed Today:  Echo 05/08/12 - Mild concentric hypertrophy. EF 55-60%. Wall motion was normal.  Normal Diast Fxn. - Aortic valve: A bioprosthesis was present. Trivial regurgitation. Mean gradient: 68m Hg (S). Peak gradient: 2108mHg (S). - Mitral valve: Calcified annulus. Valve area by continuity equation (using LVOT flow): 3.23cm^2. - Atrial septum: No defect or patent foramen ovale was identified.  Impressions:  Technically very difficult study. Atrial paced rhythm with intact A-V conduction. Normally functioning bioprosthetic AoV.  Cardiac Cath 07/2002 1. Left main: Normal. 2. LAD: The LAD had a 40% segmental proximal stenosis. 3. Circumflex: Normal. 4. Right coronary artery: Dominant and normal   Past Medical History  Diagnosis Date  . PAF (paroxysmal atrial fibrillation)   . HTN (hypertension)   . GERD (gastroesophageal reflux disease)   . Anxiety   . CAD (coronary artery disease)   . CHF (congestive heart failure)     EF 55% on 01/05/11, trace AR w/ Bioprosthetic valve  . Tobacco abuse   . DM2 (diabetes mellitus, type 2)   . Restless legs syndrome (RLS)   . HLD (hyperlipidemia)   . Anemia     Past Surgical History  Procedure Laterality Date  . Aortic valve replacement  08/25/2002   tissue valve  . Pacemaker insertion  04/02/2012    [employer]  . Permanent pacemaker generator change N/A 04/02/2012    Procedure: PERMANENT PACEMAKER GENERATOR CHANGE;  Surgeon: MiSanda KleinMD;  [facility];  Service: Cardiovascular;  Laterality: N/A;  . Cardioversion N/A 05/05/2014    Procedure: CARDIOVERSION;  Surgeon: KaDorothy SparkMD;  [facility];  Service: Cardiovascular;  Laterality: N/A;     Current Outpatient Prescriptions  Medication Sig Dispense Refill  . ALPRAZolam (XANAX) 1 MG tablet Take 1 mg by mouth 2 (two) times daily as needed for anxiety.    . Marland Kitchentenolol (TENORMIN) 50 MG tablet Take 50 mg by mouth daily.    . benazepril (LOTENSIN) 20 MG tablet Take 1 tablet (20 mg total) by mouth daily. 30 tablet 11  . Blood Glucose Monitoring Suppl (ONE TOUCH ULTRA 2) W/DEVICE KIT   0  . CRESTOR 10 MG tablet TAKE 1 TABLET (10 MG TOTAL) BY MOUTH DAILY. 90 tablet 2  . diltiazem (CARDIZEM CD) 120 MG 24 hr capsule Take 1 capsule (120 mg total) by mouth daily. 30 capsule 6  . ezetimibe (ZETIA) 10 MG tablet Take 1 tablet (10 mg total) by mouth daily. 90 tablet 3  . fluticasone (FLONASE) 50 MCG/ACT nasal spray Place 2 sprays into both nostrils daily. 16 g 0  . furosemide (LASIX) 20 MG tablet TAKE 1 TABLET BY MOUTH EVERY DAY 30 tablet 6  . HYDROcodone-acetaminophen (  NORCO) 7.5-325 MG per tablet Take 1 tablet by mouth 2 (two) times daily as needed. 60 tablet 0  . loratadine (CLARITIN) 10 MG tablet TAKE 1 TABLET (10 MG TOTAL) BY MOUTH DAILY. 30 tablet 2  . metFORMIN (GLUCOPHAGE) 1000 MG tablet TAKE 1 TABLET BY MOUTH TWICE A DAY WITH A MEAL 60 tablet 5  . omeprazole (PRILOSEC) 20 MG capsule TAKE 1 CAPSULE (20 MG TOTAL) BY MOUTH DAILY. 90 capsule 3  . ONE TOUCH ULTRA TEST test strip   11  . warfarin (COUMADIN) 5 MG tablet Take 0-7.5 mg by mouth daily. 50m on Monday, Wednesday, and Friday. 7.5 mg Tuesday and Thursday and nothing on Saturday or Sunday     No current  facility-administered medications for this visit.    Allergies:   Pravachol    Social History:  The patient  reports that she has never smoked. Her smokeless tobacco use includes Snuff. She reports that she does not drink alcohol or use illicit drugs.   Family History:  The patient's ***family history includes Heart attack in her brother; Kidney disease in her sister.    ROS:   Please see the history of present illness.   ROS    PHYSICAL EXAM: VS:  There were no vitals taken for this visit.    Wt Readings from Last 3 Encounters:  05/05/14 133 lb (60.328 kg)  05/01/14 140 lb 6.4 oz (63.685 kg)  04/02/14 133 lb (60.328 kg)     GEN: Well nourished, well developed, in no acute distress HEENT: normal Neck: *** JVD, ***carotid bruits, no masses Cardiac:  Normal S1/S2, ***RRR; *** murmur ***, *** no rubs or gallops, {NUMBERS; 1+ TO 4+, TRACE/RARE:14493} edema  Respiratory:  ***clear to auscultation bilaterally, no wheezing, rhonchi or rales. GI: ***soft, nontender, nondistended, + BS MS: no deformity or atrophy Skin: warm and dry  Neuro:  CNs II-XII intact, Strength and sensation are intact Psych: Normal affect   EKG:  EKG {ACTION; IS/IS NPFY:[id number]}ordered today.  It demonstrates:   ***   Recent Labs: 04/02/2014: TSH 1.348 05/04/2014: BUN 26*; Creatinine 1.49*; Hemoglobin 9.0*; Platelets 309; Potassium 4.3; Sodium 141    Lipid Panel    Component Value Date/Time   CHOL 305* 04/06/2009 2033   TRIG 203* 04/06/2009 2033   HDL 45 04/06/2009 2033   CHOLHDL 6.8 Ratio 04/06/2009 2033   VLDL 41* 04/06/2009 2033   LKit [place]219* 04/06/2009 2033   LDLDIRECT 42 02/10/2014 1403      ASSESSMENT AND PLAN:  Atrial flutter, unspecified  S/P AVR (aortic valve replacement)  Essential hypertension, benign  HLD (hyperlipidemia)  Pacemaker ***   Current medicines are reviewed at length with the patient today.  The patient {ACTIONS; HAS/DOES NOT HAVE:19233} concerns regarding  medicines.  The following changes have been made:  {PLAN; NO CHANGE:13088:s}  Labs/ tests ordered today include: *** No orders of the defined types were placed in this encounter.     Disposition:   FU with ***   Signed, SRichardson Dopp PA-C, M[facility] 05/17/2014 6:02 PM    [facility] [address] [place]  [id number]: [phone] Fax: [phone]

## 2014-05-18 ENCOUNTER — Encounter: Payer: Medicare Other | Admitting: Physician Assistant

## 2014-05-18 NOTE — Progress Notes (Signed)
This encounter was created in error - please disregard.

## 2014-05-19 ENCOUNTER — Other Ambulatory Visit: Payer: Self-pay | Admitting: Family Medicine

## 2014-05-19 DIAGNOSIS — K219 Gastro-esophageal reflux disease without esophagitis: Secondary | ICD-10-CM

## 2014-05-26 DIAGNOSIS — Z7901 Long term (current) use of anticoagulants: Secondary | ICD-10-CM | POA: Diagnosis not present

## 2014-05-26 DIAGNOSIS — D649 Anemia, unspecified: Secondary | ICD-10-CM | POA: Diagnosis not present

## 2014-05-26 LAB — PROTIME-INR: INR: 2.7 — AB (ref <=1.1)

## 2014-05-27 ENCOUNTER — Encounter: Payer: Self-pay | Admitting: Cardiovascular Disease

## 2014-05-29 ENCOUNTER — Ambulatory Visit (INDEPENDENT_AMBULATORY_CARE_PROVIDER_SITE_OTHER): Payer: Self-pay | Admitting: Pharmacist Clinician (PhC)/ Clinical Pharmacy Specialist

## 2014-05-29 DIAGNOSIS — Z7901 Long term (current) use of anticoagulants: Secondary | ICD-10-CM

## 2014-05-29 DIAGNOSIS — I4891 Unspecified atrial fibrillation: Secondary | ICD-10-CM

## 2014-06-02 ENCOUNTER — Ambulatory Visit (INDEPENDENT_AMBULATORY_CARE_PROVIDER_SITE_OTHER): Payer: Medicare Other | Admitting: Family Medicine

## 2014-06-02 ENCOUNTER — Encounter: Payer: Self-pay | Admitting: Family Medicine

## 2014-06-02 VITALS — BP 151/77 | HR 68 | Temp 97.4°F | Ht 64.0 in | Wt 130.2 lb

## 2014-06-02 DIAGNOSIS — Z23 Encounter for immunization: Secondary | ICD-10-CM | POA: Diagnosis not present

## 2014-06-02 DIAGNOSIS — E119 Type 2 diabetes mellitus without complications: Secondary | ICD-10-CM

## 2014-06-02 DIAGNOSIS — G894 Chronic pain syndrome: Secondary | ICD-10-CM

## 2014-06-02 DIAGNOSIS — I4892 Unspecified atrial flutter: Secondary | ICD-10-CM

## 2014-06-02 DIAGNOSIS — Z7189 Other specified counseling: Secondary | ICD-10-CM | POA: Diagnosis not present

## 2014-06-02 DIAGNOSIS — R296 Repeated falls: Secondary | ICD-10-CM

## 2014-06-02 DIAGNOSIS — G8929 Other chronic pain: Secondary | ICD-10-CM | POA: Insufficient documentation

## 2014-06-02 DIAGNOSIS — I10 Essential (primary) hypertension: Secondary | ICD-10-CM

## 2014-06-02 DIAGNOSIS — F341 Dysthymic disorder: Secondary | ICD-10-CM

## 2014-06-02 DIAGNOSIS — I4891 Unspecified atrial fibrillation: Secondary | ICD-10-CM

## 2014-06-02 LAB — POCT GLYCOSYLATED HEMOGLOBIN (HGB A1C): HEMOGLOBIN A1C: 6.1

## 2014-06-02 MED ORDER — HYDROCODONE-ACETAMINOPHEN 7.5-325 MG PO TABS: 1.0000 | ORAL_TABLET | Freq: Two times a day (BID) | ORAL | Status: DC | PRN

## 2014-06-02 MED ORDER — ALPRAZOLAM 1 MG PO TABS: 1.0000 mg | ORAL_TABLET | Freq: Two times a day (BID) | ORAL | Status: DC | PRN

## 2014-06-02 NOTE — Assessment & Plan Note (Signed)
Resolved s/p DC cardioversion 05/05/14, prior h/o PAF  Plan: 1. Continue f/u with Cardiology 2. Cont Atenolol 100mg  daily (inc from 75) and Cardizem 120mg  daily (rx with refills per Cards) 3. Cont chronic anticoag with coumadin, INR per Cards

## 2014-06-02 NOTE — Assessment & Plan Note (Signed)
Controlled BP, at goal < [id number] / 90 No complications   Plan:  1. Continue current BP meds - new changes per Cards (Atenolol inc from 75 to 100mg  daily and added Cardizem 120mg  daily) 2. Cr stable at 1.49 last checked 04/2014 3. Lifestyle Mods - stay active, dec salt intake, inc K+ rich vegs 4. Monitor BP at home occasionally

## 2014-06-02 NOTE — Assessment & Plan Note (Signed)
Recent x 2 falls without injury or trauma. No recent h/o fractures. No prior hx hip fracture. Not using walking aids.  Plan: 1. Encouraged pt to use safety when ambulating, needs to use cane or walker more regularly 2. Recommended DEXA - pt declined

## 2014-06-02 NOTE — Patient Instructions (Addendum)
Dear [name], Thank you for coming in to clinic today. It was good to see you again!  1. Diabetes is well controlled. I am happy with your blood sugar numbers today. A1c (3 month sugar) is 6.1, which is good. No changes today. 2. For your Blood Pressure / Heart Rate - it is controlled. Continue current medications - Continue Atenolol 100mg  1 tablet daily, Benazepril 20mg  daily, Cardizem 120mg  1 tablet daily (pick up refill) 3. Checked Urine Drug Screen today, signed pain contract - refills Norco (3 months) and Xanax 3 months (with refills) 4. You got Tetantus pertussis vaccine and Pneumonia vaccine today  Remember to use cane , walker to avoid future falls. Be careful when standing up suddenly.  Consider Colonoscopy screening, also recommended DEXA scan to check for osteoporosis  Please schedule a follow-up appointment with Dr. Parks Ranger in 6 months for diabetes follow-up  If you have any other questions or concerns, please feel free to call the clinic to contact me. You may also schedule an earlier appointment if necessary.  However, if your symptoms get significantly worse, please go to the Emergency Department to seek immediate medical attention.  [name], [place]

## 2014-06-02 NOTE — Assessment & Plan Note (Addendum)
Controlled, chronic LBP with OA  Plan: 1. Refilled Norco 7.5/325mg  1-2 tabs daily (#60, 0 refills) printed rx x 3 for 3 month supply 2. Obtained UDS 3. Reviewed [facility] Pain Contract today - signed new copy, to be scanned 4. Reviewed narcotic rx database with preceptor - no red flags 5. RTC 3 to 6 months

## 2014-06-02 NOTE — Assessment & Plan Note (Signed)
Well-controlled, stable. A1c 6.1 (06/02/14), last 5.9 (0000000) No complications or hypoglycemia - DM Foot exam (06/02/14) normal - Last ophtho within past 6 months  Plan: 1. Continue Metformin 1000mg  BID 2. RTC 3-6 months

## 2014-06-02 NOTE — Progress Notes (Signed)
   Subjective:    Patient ID: Glenice Bow, female    DOB: 1930-01-12, 81 y.o.   MRN: [id number]  History provided by both patient and her daughter [name] present during visit.  HPI  CHRONIC DM, Type 2: Reports - no concerns CBGs: Avg 130s, Low 98, High >200 (rarely). Checks CBGs 2x daily Meds: Metformin 1000mg  BID Reports  good compliance. Tolerating well w/o side-effects Currently on ACEi - Followed by Ophthalmologist, last eye exam 4-5 months ago, goes annually Denies hypoglycemia, polyuria, visual changes, numbness or tingling.  CHRONIC HTN: Reports - checks BP at home without significant low readings, avg 110s/50-60s Current Meds - Benazepril 20mg  daily, Atenolol 100mg  BID (increased from 75mg  BID per Cards), Cardizem-24 hr 120mg  daily (since 05/01/14) - Stopped taking Lasix regularly, only PRN swelling Reports good compliance, took meds today. Tolerating well, w/o complaints. Denies CP, dyspnea, HA, edema, dizziness / lightheadedness  AFIB with recent history of Atrial Flutter with RVR - Followed by Cardiology, see multiple recent telephone and OV notes - Last seen 05/01/14 in Cards office, arranged for outpatient DC Cardioversion performed on 05/05/14, successful, additionally med changes with inc Atenolol 75 to 100mg  daily and added Cardizem-24 120mg  daily - Overall doing better since procedure. Denies any more spells of racing heart with palpitations or pre-syncope symptoms.  ANXIETY / CHRONIC PAIN: - Chronic low back pain with history of osteoarthritis in spine - Currently functionally improved while on Norco - takes up to 2x daily PRN LBP - Requesting 3 month refills on Norco and Xanax  FALLS: - Reports 2 recent falls, accident tripping over dog, and then fell at night missed night-stand. Had multiple superficial skin abrasions. Daughter able to help her help, concerns with generalized weakness. Has cane and walker but does not use them regularly per her daughter. Added  bedside support around lower part of bed to prevent falls. - Denies LOC, HA, syncope, vision loss, leg weakness, trauma or injury  I have reviewed and updated the following as appropriate: allergies and current medications  Social Hx: - Never smoker  Review of Systems  See above HPI    Objective:   Physical Exam  BP 151/77 mmHg  Pulse 68  Temp(Src) 97.4 F (36.3 C) (Oral)  Ht 5\' 4"  (1.626 m)  Wt 130 lb 3.2 oz (59.058 kg)  BMI 22.34 kg/m2  Gen - elderly female, pleasant and cooperative, NAD HEENT - PERRL, EOMI, MMM Neck - supple, non-tender Heart - RRR, stable +1/6 SEM +R-sternal border Lungs - CTAB, no wheezing, crackles, or rhonchi. Normal work of breathing. MSK - bilateral lower back tender to palpation over paraspinal muscles, minimal tenderness directly over lumbar spinous processes, seated SLR without radicular symptoms Abd - soft, NTND, no masses, +active BS Skin - warm, significantly dry skin over extremities (dorsal aspect of feet), monofilament testing (DM foot exam) negative Neuro - awake, alert, grossly non-focal     Assessment & Plan:   See specific A&P problem list for details.

## 2014-06-03 LAB — DRUG SCR UR, PAIN MGMT, REFLEX CONF
AMPHETAMINE SCRN UR: NEGATIVE
Barbiturate Quant, Ur: NEGATIVE
COCAINE METABOLITES: NEGATIVE
Creatinine,U: 139.58 mg/dL
Marijuana Metabolite: NEGATIVE
Methadone: NEGATIVE
Opiates: NEGATIVE
Phencyclidine (PCP): NEGATIVE
Propoxyphene: NEGATIVE

## 2014-06-07 LAB — BENZODIAZEPINES (GC/LC/MS), URINE
Alprazolam metabolite (GC/LC/MS), ur confirm: 1228 ng/mL — ABNORMAL HIGH (ref <25)
Clonazepam metabolite (GC/LC/MS), ur confirm: NEGATIVE ng/mL (ref <25)
FLURAZEPAMU: NEGATIVE ng/mL (ref <50)
Lorazepam (GC/LC/MS), ur confirm: NEGATIVE ng/mL (ref <50)
Midazolam (GC/LC/MS), ur confirm: NEGATIVE ng/mL (ref <50)
NORDIAZEPAMU: NEGATIVE ng/mL (ref <50)
OXAZEPAMU: NEGATIVE ng/mL (ref <50)
Temazepam (GC/LC/MS), ur confirm: NEGATIVE ng/mL (ref <50)
Triazolam metabolite (GC/LC/MS), ur confirm: NEGATIVE ng/mL (ref <50)

## 2014-06-11 ENCOUNTER — Encounter: Payer: Medicare Other | Admitting: Cardiovascular Disease

## 2014-06-18 DIAGNOSIS — Z7901 Long term (current) use of anticoagulants: Secondary | ICD-10-CM | POA: Diagnosis not present

## 2014-06-18 LAB — PROTIME-INR: INR: 1.9 — AB (ref 0.9–1.1)

## 2014-06-19 ENCOUNTER — Ambulatory Visit (INDEPENDENT_AMBULATORY_CARE_PROVIDER_SITE_OTHER): Payer: Medicare Other | Admitting: Pharmacist Clinician (PhC)/ Clinical Pharmacy Specialist

## 2014-06-19 DIAGNOSIS — Z7901 Long term (current) use of anticoagulants: Secondary | ICD-10-CM

## 2014-06-19 DIAGNOSIS — I4891 Unspecified atrial fibrillation: Secondary | ICD-10-CM

## 2014-07-22 DIAGNOSIS — Z7901 Long term (current) use of anticoagulants: Secondary | ICD-10-CM | POA: Diagnosis not present

## 2014-07-22 LAB — PROTIME-INR: INR: 2.9 — AB (ref 0.9–1.1)

## 2014-07-23 ENCOUNTER — Ambulatory Visit (INDEPENDENT_AMBULATORY_CARE_PROVIDER_SITE_OTHER): Payer: Self-pay | Admitting: Pharmacist Clinician (PhC)/ Clinical Pharmacy Specialist

## 2014-07-23 DIAGNOSIS — I4891 Unspecified atrial fibrillation: Secondary | ICD-10-CM

## 2014-07-23 DIAGNOSIS — Z7901 Long term (current) use of anticoagulants: Secondary | ICD-10-CM

## 2014-08-13 ENCOUNTER — Other Ambulatory Visit: Payer: Self-pay | Admitting: Cardiovascular Disease

## 2014-08-13 NOTE — Telephone Encounter (Signed)
Rx(s) sent to pharmacy electronically.  

## 2014-08-25 ENCOUNTER — Encounter: Payer: Medicare Other | Admitting: Cardiovascular Disease

## 2014-09-01 ENCOUNTER — Ambulatory Visit (INDEPENDENT_AMBULATORY_CARE_PROVIDER_SITE_OTHER): Payer: Medicare Other | Admitting: Cardiovascular Disease

## 2014-09-01 ENCOUNTER — Ambulatory Visit (INDEPENDENT_AMBULATORY_CARE_PROVIDER_SITE_OTHER): Payer: Self-pay | Admitting: Pharmacist Clinician (PhC)/ Clinical Pharmacy Specialist

## 2014-09-01 ENCOUNTER — Encounter: Payer: Self-pay | Admitting: Cardiovascular Disease

## 2014-09-01 VITALS — BP 162/72 | HR 75 | Ht 64.0 in | Wt 130.0 lb

## 2014-09-01 DIAGNOSIS — Z952 Presence of prosthetic heart valve: Secondary | ICD-10-CM

## 2014-09-01 DIAGNOSIS — E785 Hyperlipidemia, unspecified: Secondary | ICD-10-CM

## 2014-09-01 DIAGNOSIS — Z7901 Long term (current) use of anticoagulants: Secondary | ICD-10-CM

## 2014-09-01 DIAGNOSIS — I5032 Chronic diastolic (congestive) heart failure: Secondary | ICD-10-CM | POA: Diagnosis not present

## 2014-09-01 DIAGNOSIS — I455 Other specified heart block: Secondary | ICD-10-CM

## 2014-09-01 DIAGNOSIS — I4891 Unspecified atrial fibrillation: Secondary | ICD-10-CM

## 2014-09-01 DIAGNOSIS — I10 Essential (primary) hypertension: Secondary | ICD-10-CM | POA: Diagnosis not present

## 2014-09-01 DIAGNOSIS — Z954 Presence of other heart-valve replacement: Secondary | ICD-10-CM | POA: Diagnosis not present

## 2014-09-01 DIAGNOSIS — Z950 Presence of cardiac pacemaker: Secondary | ICD-10-CM

## 2014-09-01 DIAGNOSIS — I4892 Unspecified atrial flutter: Secondary | ICD-10-CM

## 2014-09-01 LAB — CUP PACEART INCLINIC DEVICE CHECK
Battery Voltage: 2.98 V
Brady Statistic RA Percent Paced: 85 %
Date Time Interrogation Session: 20160705125424
Lead Channel Impedance Value: 275 Ohm
Lead Channel Pacing Threshold Amplitude: 1.125 V
Lead Channel Pacing Threshold Amplitude: 1.5 V
Lead Channel Sensing Intrinsic Amplitude: 3.2 mV
Lead Channel Sensing Intrinsic Amplitude: 5.4 mV
Lead Channel Setting Pacing Amplitude: 2.125
Lead Channel Setting Pacing Pulse Width: 1 ms
Lead Channel Setting Sensing Sensitivity: 1 mV
MDC IDC MSMT BATTERY REMAINING LONGEVITY: 109.2 mo
MDC IDC MSMT LEADCHNL RA IMPEDANCE VALUE: 387.5 Ohm
MDC IDC MSMT LEADCHNL RA PACING THRESHOLD PULSEWIDTH: 0.5 ms
MDC IDC MSMT LEADCHNL RV PACING THRESHOLD AMPLITUDE: 1.5 V
MDC IDC MSMT LEADCHNL RV PACING THRESHOLD PULSEWIDTH: 1 ms
MDC IDC MSMT LEADCHNL RV PACING THRESHOLD PULSEWIDTH: 1 ms
MDC IDC SET LEADCHNL RV PACING AMPLITUDE: 3 V
MDC IDC STAT BRADY RV PERCENT PACED: 2.6 %
Pulse Gen Serial Number: 7445049

## 2014-09-01 LAB — POCT INR: INR: 1.3

## 2014-09-01 NOTE — Progress Notes (Signed)
Patient ID: [name], female   DOB: 1930/02/11, 81 y.o.   MRN: [id number]      Cardiology Office Note   Date:  09/01/2014   ID:  [name], DOB 1929-05-26, MRN [id number]  PCP:  [name], DO  Cardiologist:   Sanda Klein, MD   Chief Complaint  Patient presents with  . post hospital follow up    Patient has felt light headed, dizzy, and a littlre swelling in her ankles.      History of Present Illness: [name] is a 81 y.o. female who presents for Status post aortic valve replacement, sinus node dysfunction status post pacemaker, paroxysmal atrial fibrillation, peripheral venous insufficiency .  She feels well and has no CV complaints. She has lost weight, has poor appetite and depression. She has not had recurrent sustained arrhythmia since she underwent cardioversion in March 2016 for a 1 month long persistent episode.  Pacemaker check shows normal device function. 85% A, paced 2.6% V paced. no NSVT, no atrial fib, 12 episodes of atrial tachycardia/flutter, all brief. Underlying rhythm is sinus bradycardia 42 bpm. Generator longevity 4-9 years.  In 2004 she underwent replacement of her aortic valve with a 21 mm biological prosthesis by Dr. Servando Snare.  In 2005 she received a dual-chamber permanent pacemaker and had a generator change in 2014 ([employer] accident DR RF device). Her atrial lead has slightly elevated pacing thresholds (2.25 V at 0.5 ms , 1.5_0 .0 ms) but her device otherwise functions normally. She has episodes of brief paroxysmal atrial tachycardia. She does have a history of atrial fibrillation for which he takes warfarin and required cardioversion for persistent AF in March 2016.Marland Kitchen She has severe hypercholesterolemia requiring simultaneous treatment with Crestor and Zetia, but by coronary angiography before AVR only had a 40% LAD stenosis without other meaningful coronary disease. She has preserved left ventricular systolic function with  an echo was recently performed in March of this year. Prosthetic valve function was normal at that time.    Past Medical History  Diagnosis Date  . PAF (paroxysmal atrial fibrillation)   . HTN (hypertension)   . GERD (gastroesophageal reflux disease)   . Anxiety   . CAD (coronary artery disease)   . CHF (congestive heart failure)     EF 55% on 01/05/11, trace AR w/ Bioprosthetic valve  . Tobacco abuse   . DM2 (diabetes mellitus, type 2)   . Restless legs syndrome (RLS)   . HLD (hyperlipidemia)   . Anemia     Past Surgical History  Procedure Laterality Date  . Aortic valve replacement  08/25/2002    tissue valve  . Pacemaker insertion  04/02/2012    [employer]  . Permanent pacemaker generator change N/A 04/02/2012    Procedure: PERMANENT PACEMAKER GENERATOR CHANGE;  Surgeon: Sanda Klein, MD;  Location: [place] C[facility] LAB;  Service: Cardiovascular;  Laterality: N/A;  . Cardioversion N/A 05/05/2014    Procedure: CARDIOVERSION;  Surgeon: Dorothy Spark, MD;  [facility];  Service: Cardiovascular;  Laterality: N/A;     Current Outpatient Prescriptions  Medication Sig Dispense Refill  . ALPRAZolam (XANAX) 1 MG tablet Take 1 tablet (1 mg total) by mouth 2 (two) times daily as needed for anxiety. 30 tablet 2  . atenolol (TENORMIN) 100 MG tablet     . benazepril (LOTENSIN) 20 MG tablet Take 1 tablet (20 mg total) by mouth daily. 30 tablet 11  . Blood Glucose Monitoring Suppl (ONE TOUCH ULTRA 2) W/DEVICE KIT  0  . CRESTOR 10 MG tablet TAKE 1 TABLET (10 MG TOTAL) BY MOUTH DAILY. 90 tablet 2  . diltiazem (CARDIZEM CD) 120 MG 24 hr capsule Take 1 capsule (120 mg total) by mouth daily. 30 capsule 6  . docusate sodium (COLACE) 100 MG capsule   0  . ezetimibe (ZETIA) 10 MG tablet Take 1 tablet (10 mg total) by mouth daily. 90 tablet 3  . fluticasone (FLONASE) 50 MCG/ACT nasal spray Place 2 sprays into both nostrils daily. 16 g 0  . furosemide (LASIX) 20 MG tablet TAKE 1 TABLET BY MOUTH  EVERY DAY 30 tablet 6  . HYDROcodone-acetaminophen (NORCO) 7.5-325 MG per tablet Take 1 tablet by mouth 2 (two) times daily as needed. 60 tablet 0  . metFORMIN (GLUCOPHAGE) 1000 MG tablet TAKE 1 TABLET BY MOUTH TWICE A DAY WITH A MEAL 60 tablet 5  . omeprazole (PRILOSEC) 20 MG capsule TAKE 1 CAPSULE (20 MG TOTAL) BY MOUTH DAILY. 90 capsule 3  . ONE TOUCH ULTRA TEST test strip   11  . warfarin (COUMADIN) 5 MG tablet Take 0-7.5 mg by mouth daily. 14m on Monday, Wednesday, and Friday. 7.5 mg Tuesday and Thursday and nothing on Saturday or Sunday     No current facility-administered medications for this visit.    Allergies:   Pravachol    Social History:  The patient  reports that she has never smoked. Her smokeless tobacco use includes Snuff. She reports that she does not drink alcohol or use illicit drugs.   Family History:  The patient's family history includes Heart attack in her brother; Kidney disease in her sister.    ROS:  Please see the history of present illness.    Otherwise, review of systems positive for none.   All other systems are reviewed and negative.    PHYSICAL EXAM: VS:  BP 162/72 mmHg  Pulse 75  Ht 5' 4" (1.626 m)  Wt 130 lb (58.968 kg)  BMI 22.30 kg/m2 , BMI Body mass index is 22.3 kg/(m^2).  General: Alert, oriented x3, no distress Head: no evidence of trauma, PERRL, EOMI, no exophtalmos or lid lag, no myxedema, no xanthelasma; normal ears, nose and oropharynx Neck: normal jugular venous pulsations and no hepatojugular reflux; brisk carotid pulses without delay and no carotid bruits Chest: clear to auscultation, no signs of consolidation by percussion or palpation, normal fremitus, symmetrical and full respiratory excursions Cardiovascular: normal position and quality of the apical impulse, regular rhythm, normal first and second heart sounds, no  murmurs, rubs or gallops Abdomen: no tenderness or distention, no masses by palpation, no abnormal pulsatility or  arterial bruits, normal bowel sounds, no hepatosplenomegaly Extremities: no clubbing, cyanosis or edema; 2+ radial, ulnar and brachial pulses bilaterally; 2+ right femoral, posterior tibial and dorsalis pedis pulses; 2+ left femoral, posterior tibial and dorsalis pedis pulses; no subclavian or femoral bruits Neurological: grossly nonfocal Psych: euthymic mood, full affect   EKG:  EKG is ordered today. The ekg ordered today demonstrates A paced V sensed with PACs   Recent Labs: 04/02/2014: TSH 1.348 05/04/2014: BUN 26*; Creat 1.49*; Hemoglobin 9.0*; Platelets 309; Potassium 4.3; Sodium 141    Lipid Panel    Component Value Date/Time   CHOL 305* 04/06/2009 2033   TRIG 203* 04/06/2009 2033   HDL 45 04/06/2009 2033   CHOLHDL 6.8 Ratio 04/06/2009 2033   VLDL 41* 04/06/2009 2033   LDLCALC 219* 04/06/2009 2033   LDLDIRECT 42 02/10/2014 1403  Wt Readings from Last 3 Encounters:  09/01/14 130 lb (58.968 kg)  06/02/14 130 lb 3.2 oz (59.058 kg)  05/05/14 133 lb (60.328 kg)     ASSESSMENT AND PLAN:  Pacemaker - [employer]. Jude dual chamber Pacemaker check in clinic. Rather high (but stable) atrial thresholds.Normal device function. Remote check via [employer] in 3 months, office visit in 12 months. Should be considered pacemaker dependent (virtually sinus arrest, intact AV conduction).  Atrial fibrillation  Very low burden of arrhythmia but keep on warfarin indefinitely barring any bleeding complications. Continue beta blocker for rate control.   HYPERLIPIDEMIA  Excellent LDL December 2015  HYPERTENSION, BENIGN  Insufficient control. Increase benazepril to 40 mg daily  S/P AVR bioprosthesis Normal valve function, normal LVEF by echo 2014     Current medicines are reviewed at length with the patient today.  The patient does not have concerns regarding medicines.  The following changes have been made:   Increase benazepril to 40 mg daily  Labs/ tests ordered today include:    Orders Placed This Encounter  Procedures  . Protime-INR  . Implantable device check  . EKG 12-Lead     Patient Instructions  Medication Instructions:   TAKE 7.5MG OF WARFARIN TODAY AND TOMORROW THEN RESUME REGULAR DOSING. WILL RECHECK BLOOD WORK IN 2 WEEKS.  Labwork:  2 WEEKS FOR PT/INR  Testing/Procedures:  NONE  Follow-Up:  Dr. Sallyanne Kuster recommends that you schedule a follow-up appointment in: 6 MONTHS      Any Other Special Instructions Will Be Listed Below (If Applicable).  Remote monitoring is used to monitor your pacemaker from home. This monitoring reduces the number of office visits required to check your device to one time per year. It allows [place] to keep an eye on the functioning of your device to ensure it is working properly. You are scheduled for a device check from home on 12/01/2014. You may send your transmission at any time that day. If you have a wireless device, the transmission will be sent automatically. After your physician reviews your transmission, you will receive a postcard with your next transmission date.         Mikael Spray, MD  09/01/2014 5:44 PM    Sanda Klein, MD, [facility] [phone] office [phone] pager

## 2014-09-01 NOTE — Patient Instructions (Addendum)
Medication Instructions:   TAKE 7.5MG  OF WARFARIN TODAY AND TOMORROW THEN RESUME REGULAR DOSING. WILL RECHECK BLOOD WORK IN 2 WEEKS.  Labwork:  2 WEEKS FOR PT/INR  Testing/Procedures:  NONE  Follow-Up:  Dr. Sallyanne Kuster recommends that you schedule a follow-up appointment in: 6 MONTHS      Any Other Special Instructions Will Be Listed Below (If Applicable).  Remote monitoring is used to monitor your pacemaker from home. This monitoring reduces the number of office visits required to check your device to one time per year. It allows [place] to keep an eye on the functioning of your device to ensure it is working properly. You are scheduled for a device check from home on 12/01/2014. You may send your transmission at any time that day. If you have a wireless device, the transmission will be sent automatically. After your physician reviews your transmission, you will receive a postcard with your next transmission date.

## 2014-09-02 ENCOUNTER — Encounter: Payer: Self-pay | Admitting: Family Medicine

## 2014-09-02 ENCOUNTER — Ambulatory Visit (INDEPENDENT_AMBULATORY_CARE_PROVIDER_SITE_OTHER): Payer: Medicare Other | Admitting: Family Medicine

## 2014-09-02 VITALS — BP 147/56 | HR 69 | Temp 98.4°F | Ht 64.0 in | Wt 129.2 lb

## 2014-09-02 DIAGNOSIS — F341 Dysthymic disorder: Secondary | ICD-10-CM | POA: Diagnosis not present

## 2014-09-02 DIAGNOSIS — E119 Type 2 diabetes mellitus without complications: Secondary | ICD-10-CM

## 2014-09-02 DIAGNOSIS — G894 Chronic pain syndrome: Secondary | ICD-10-CM | POA: Diagnosis not present

## 2014-09-02 DIAGNOSIS — N3000 Acute cystitis without hematuria: Secondary | ICD-10-CM | POA: Diagnosis not present

## 2014-09-02 DIAGNOSIS — I10 Essential (primary) hypertension: Secondary | ICD-10-CM

## 2014-09-02 DIAGNOSIS — N390 Urinary tract infection, site not specified: Secondary | ICD-10-CM | POA: Insufficient documentation

## 2014-09-02 DIAGNOSIS — R399 Unspecified symptoms and signs involving the genitourinary system: Secondary | ICD-10-CM | POA: Diagnosis not present

## 2014-09-02 LAB — POCT UA - MICROSCOPIC ONLY

## 2014-09-02 LAB — POCT URINALYSIS DIPSTICK
Bilirubin, UA: NEGATIVE
Blood, UA: NEGATIVE
Glucose, UA: NEGATIVE
Ketones, UA: NEGATIVE
Nitrite, UA: NEGATIVE
PROTEIN UA: 30
SPEC GRAV UA: 1.02
Urobilinogen, UA: 2
pH, UA: 6

## 2014-09-02 LAB — POCT GLYCOSYLATED HEMOGLOBIN (HGB A1C): Hemoglobin A1C: 5.9

## 2014-09-02 MED ORDER — HYDROCODONE-ACETAMINOPHEN 7.5-325 MG PO TABS: 1.0000 | ORAL_TABLET | Freq: Two times a day (BID) | ORAL | Status: DC | PRN

## 2014-09-02 MED ORDER — ESCITALOPRAM OXALATE 10 MG PO TABS: 10.0000 mg | ORAL_TABLET | Freq: Every day | ORAL | Status: DC

## 2014-09-02 MED ORDER — CEPHALEXIN 500 MG PO CAPS: 500.0000 mg | ORAL_CAPSULE | Freq: Three times a day (TID) | ORAL | Status: DC

## 2014-09-02 MED ORDER — ALPRAZOLAM 1 MG PO TABS: 1.0000 mg | ORAL_TABLET | Freq: Two times a day (BID) | ORAL | Status: DC | PRN

## 2014-09-02 NOTE — Assessment & Plan Note (Signed)
Controlled BP, at goal < 150 / 90. Recently inc Benazepril 20 to 40mg  daily by Cardiology, concern with previous h/o hypotension and symptomatic. Remains wide pulse pressure. No complications - S/p AVR, PAF  Plan:  1. Continue current BP meds - new changes per Cards (Benazepril 20 to 40mg  daily), Atenolol 100mg  BID, Cardizem 120mg  daily 2. Not due for BMET, SCr stable, last check 04/2014 3. Lifestyle Mods - stay active, dec salt intake, inc K+ rich vegs 4. Monitor BP at home occasionally

## 2014-09-02 NOTE — Assessment & Plan Note (Addendum)
Controlled, chronic LBP with OA - Updated [facility] surveillance at last visit.  Plan: 1. Refilled Norco 7.5/325mg  1-2 tabs daily (#60, 0 refills) printed rx x 3 for 3 month supply 2. Advised pt can call and leave message when 1-2 week prior to due next 3 mo Norco refill, will print and leave for pick-up at [facility] (for interval 3 mo refills) 3. RTC 6 months

## 2014-09-02 NOTE — Assessment & Plan Note (Signed)
Consistent with acute uncomplicated cystitis clinically. Similar to previous symptoms. UA not entirely suggestive trace leuks, WBC 1-5 - Afebrile, no complications, tolerating PO  Plan: 1. Empiric Keflex 500mg  TID x 7 days 2. Order Urine culture - f/u results 3. Return criteria given

## 2014-09-02 NOTE — Assessment & Plan Note (Signed)
Worsening recent depression with change in living situation at home. Good family support. Recent uncontrolled anxiety (ran out of xanax). - No SI/HI. No other complications or red flags  Plan: 1. Agree to start trial SSRI with Lexapro 10mg  daily 2. Refilled Xanax 1mg  BID (#60 for 1 month supply, +2 refills) - will authorize and print refill request in 3 mo for pt if requested 3. RTC 1 month for re-evaluation Depression, consider titrate up to 20mg  qd, concern with multiple [employer] medications

## 2014-09-02 NOTE — Patient Instructions (Signed)
Dear [name], Thank you for coming in to clinic today. It was good to see you again!  1. Diabetes is well controlled. I am happy with your blood sugar numbers today. A1c (3 month sugar) is 5.9 which is good. No changes today. Great job. - next diabetes check in 6 months 2. For your Blood Pressure / Heart Rate - it is controlled. Continue current medications - Continue Atenolol 100mg  1 tablet daily, Benazepril 40mg  daily, Cardizem 120mg  1 tablet daily (pick up refill) 3. refills Norco (3 months) and Xanax 3 months (with refills) - Call 1-2 weeks before you run out in 3 months, and leave message for request of refills and i will print you can pick up at our office  Depression - Started Lexapro 10mg  - take daily - follow-up in 1 month  Remember to use cane , walker to avoid future falls. Be careful when standing up suddenly.  Consider Colonoscopy screening, also recommended DEXA scan to check for osteoporosis  Please schedule a follow-up appointment with Dr. Parks Ranger in 1 month for depression, otherwise next visit 6 months for diabetes follow-up  If you have any other questions or concerns, please feel free to call the clinic to contact me. You may also schedule an earlier appointment if necessary.  However, if your symptoms get significantly worse, please go to the Emergency Department to seek immediate medical attention.  [name], [place]

## 2014-09-02 NOTE — Progress Notes (Signed)
   Subjective:    Patient ID: [name], female    DOB: 1929/05/02, 81 y.o.   MRN: [id number]  History provided by both patient and her daughter [name] present during visit.  HPI   UTI: - Symptoms started yesterday when woke up. Tolerating PO well. Last UTI months to years ago, can't recall, but symptoms feel similar. - Admits mild dysuria, R-low back pain and suprapubic pain - Denies any fevers/chills, nausea / vomiting, hematuria  CHRONIC DM, Type 2: Reports - no concerns CBGs: Avg 130s, Low 90s, High < 200 (rarely). Checks CBGs 2x daily Meds: Metformin 1000mg  BID Reports  good compliance. Tolerating well w/o side-effects Currently on ACEi - Followed by Ophthalmologist, last eye exam Winter 2015 Denies hypoglycemia, polyuria, visual changes, numbness or tingling.  CHRONIC HTN: Reports - stable, no recent checks at home. Recently followed up with Cardiology yesterday and inc Benazepril from 20 to 40mg  daily Current Meds - Benazepril 40mg  daily, Atenolol 100mg  BID, Cardizem-24 hr 120mg  daily (since 05/01/14) - Lasix is PRN swelling (infrequently takes) Reports good compliance, took meds today. Tolerating well, w/o complaints. Denies CP, dyspnea, HA, edema, dizziness / lightheadedness  DEPRESSION / ANXIETY - Family reports that patient has been more depressed recently due to change in living situation, no longer living with daughter [name] all the time, now spends nights with her but during day she is with her sister, concerned about transition to nursing home or alternative location. Her chronic anxiety seems to be affecting her worse (recently out of Xanax only filled 30 tabs instead of usual 60) - Request to start SSRI, Xanax refill #60 for 3 month supply - Admits to crying spells, sadness, reduced appetite intermittently - Denies any suicidal or homicidal ideation, hallucinations, anger/irritability  CHRONIC PAIN SYNDROME: - Chronic low back pain with history of  osteoarthritis in spine - Currently functionally improved while on Norco - takes up to 2x daily PRN LBP - Requesting 3 month refills on Norco for 3 month supply  I have reviewed and updated the following as appropriate: allergies and current medications  Social Hx: - Never smoker  Review of Systems  See above HPI    Objective:   Physical Exam  BP 147/56 mmHg  Pulse 69  Temp(Src) 98.4 F (36.9 C) (Oral)  Ht 5\' 4"  (1.626 m)  Wt 129 lb 3.2 oz (58.605 kg)  BMI 22.17 kg/m2  Gen - elderly female chronically ill but currently well, comfortable, pleasant, NAD HEENT - oropharynx clear, MMM Heart - RRR, stable +2/6 SEM +R-sternal border (s/p AVR) Lungs - CTAB, no wheezing, crackles, or rhonchi. Non-labored MSK - Back: b/l Lumbar lower paraspinal +TTP, stable, without deformity of spine, seated SLR without radicular symptoms. Skin - warm, significantly dry skin over extremities Neuro - awake, alert, grossly non-focal Psych - appropriate mood, congruent affect, good judgement and insight     Assessment & Plan:   See specific A&P problem list for details.

## 2014-09-02 NOTE — Assessment & Plan Note (Signed)
Well-controlled, stable. At goal. Current A1c 5.9 (08/28/14) last 6.1 (Q000111Q) No complications or hypoglycemia  Plan: 1. Continue Metformin 1000mg  BID 2. RTC 6 months A1c

## 2014-09-03 ENCOUNTER — Telehealth: Payer: Self-pay | Admitting: *Deleted

## 2014-09-03 NOTE — Telephone Encounter (Signed)
LM to increase Benazepril to 40mg  daily if she has not already this.  Told to call if she has any questions.  She was seen by her PCP yesterday and this increase was noted at that time on her med list.

## 2014-09-03 NOTE — Telephone Encounter (Signed)
-----   Message from Sanda Klein, MD sent at 09/01/2014  5:53 PM EDT ----- Please ask to increase benazepril to 40 mg daily. Told patient, forgot to tell you to put on AVS MCr

## 2014-09-04 LAB — URINE CULTURE
Colony Count: NO GROWTH
Organism ID, Bacteria: NO GROWTH

## 2014-09-09 ENCOUNTER — Other Ambulatory Visit: Payer: Self-pay | Admitting: Cardiovascular Disease

## 2014-09-09 NOTE — Telephone Encounter (Signed)
REFILL 

## 2014-09-21 ENCOUNTER — Encounter: Payer: Self-pay | Admitting: Cardiovascular Disease

## 2014-09-23 DIAGNOSIS — Z7901 Long term (current) use of anticoagulants: Secondary | ICD-10-CM | POA: Diagnosis not present

## 2014-09-23 LAB — PROTIME-INR: INR: 8.7 — AB (ref 0.9–1.1)

## 2014-09-24 ENCOUNTER — Telehealth: Payer: Self-pay | Admitting: Pharmacist Clinician (PhC)/ Clinical Pharmacy Specialist

## 2014-09-24 ENCOUNTER — Ambulatory Visit (INDEPENDENT_AMBULATORY_CARE_PROVIDER_SITE_OTHER): Payer: Self-pay | Admitting: Pharmacist Clinician (PhC)/ Clinical Pharmacy Specialist

## 2014-09-24 DIAGNOSIS — Z7901 Long term (current) use of anticoagulants: Secondary | ICD-10-CM

## 2014-09-24 DIAGNOSIS — I4891 Unspecified atrial fibrillation: Secondary | ICD-10-CM

## 2014-09-24 NOTE — Telephone Encounter (Signed)
[facility] reporting lab draw yesterday - PT 91.5, INR 8.7  Sent fax of results to our attn.  Will address w/ Erasmo Downer.

## 2014-09-24 NOTE — Telephone Encounter (Signed)
Fax received, see anticoag note

## 2014-09-24 NOTE — Telephone Encounter (Signed)
[name] is calling in to report critical PT INR results. Please f/u   Thanks

## 2014-09-28 DIAGNOSIS — M25561 Pain in right knee: Secondary | ICD-10-CM | POA: Diagnosis not present

## 2014-09-28 DIAGNOSIS — N399 Disorder of urinary system, unspecified: Secondary | ICD-10-CM | POA: Diagnosis not present

## 2014-09-28 DIAGNOSIS — Z79899 Other long term (current) drug therapy: Secondary | ICD-10-CM | POA: Diagnosis not present

## 2014-09-28 DIAGNOSIS — M25562 Pain in left knee: Secondary | ICD-10-CM | POA: Diagnosis not present

## 2014-09-28 DIAGNOSIS — Z7901 Long term (current) use of anticoagulants: Secondary | ICD-10-CM | POA: Diagnosis not present

## 2014-09-28 LAB — PROTIME-INR: INR: 1.4 — AB (ref 0.9–1.1)

## 2014-09-30 ENCOUNTER — Ambulatory Visit (INDEPENDENT_AMBULATORY_CARE_PROVIDER_SITE_OTHER): Payer: Self-pay | Admitting: Pharmacist Clinician (PhC)/ Clinical Pharmacy Specialist

## 2014-09-30 DIAGNOSIS — Z7901 Long term (current) use of anticoagulants: Secondary | ICD-10-CM

## 2014-09-30 DIAGNOSIS — I4891 Unspecified atrial fibrillation: Secondary | ICD-10-CM

## 2014-09-30 MED ORDER — APIXABAN 2.5 MG PO TABS: 2.5000 mg | ORAL_TABLET | Freq: Two times a day (BID) | ORAL | Status: DC

## 2014-10-01 ENCOUNTER — Telehealth: Payer: Self-pay | Admitting: *Deleted

## 2014-10-01 NOTE — Telephone Encounter (Signed)
Faxed prior auth for eliquis 2.5mg  tablets to [facility]

## 2014-10-01 NOTE — Telephone Encounter (Signed)
Patient's daughter notified that eliquis 2.5mg  BID was approved under Medicare Part D thru 10/01/2015

## 2014-10-15 ENCOUNTER — Other Ambulatory Visit: Payer: Self-pay | Admitting: Family Medicine

## 2014-10-15 DIAGNOSIS — E119 Type 2 diabetes mellitus without complications: Secondary | ICD-10-CM

## 2014-11-10 ENCOUNTER — Other Ambulatory Visit: Payer: Self-pay | Admitting: Cardiovascular Disease

## 2014-11-30 ENCOUNTER — Telehealth: Payer: Self-pay | Admitting: Family Medicine

## 2014-11-30 DIAGNOSIS — F341 Dysthymic disorder: Secondary | ICD-10-CM

## 2014-11-30 DIAGNOSIS — G894 Chronic pain syndrome: Secondary | ICD-10-CM

## 2014-11-30 MED ORDER — ALPRAZOLAM 1 MG PO TABS: 1.0000 mg | ORAL_TABLET | Freq: Two times a day (BID) | ORAL | Status: DC | PRN

## 2014-11-30 MED ORDER — HYDROCODONE-ACETAMINOPHEN 7.5-325 MG PO TABS
1.0000 | ORAL_TABLET | Freq: Two times a day (BID) | ORAL | Status: DC | PRN
Start: 2014-11-30 — End: 2014-11-30

## 2014-11-30 MED ORDER — HYDROCODONE-ACETAMINOPHEN 7.5-325 MG PO TABS: 1.0000 | ORAL_TABLET | Freq: Two times a day (BID) | ORAL | Status: DC | PRN

## 2014-11-30 NOTE — Telephone Encounter (Signed)
Need refill for her hydrocodone and her alprazolam

## 2014-11-30 NOTE — Telephone Encounter (Signed)
Last OV 08/2014 for chronic pain management and anxiety, had discussed since she has been very stable on this current regimen without problems, advised her that I could print 3 month supply rx of Norco and Alprazolam and have them ready for pick-up for next refill (which is due currently now in October 2016).  Returned call, spoke with daughter, [name], she reported that patient [name] is doing well, just requesting med refill as I had discussed. Advised her that printed rx will be available for pick-up front of [facility] office starting Tuesday Oct 4.  Advised she may follow-up with me for OV in 3 months for next chronic pain visit. Due for UDS in 05/2015 and review [facility] Pain Contract.  [name], [place], PGY-3

## 2014-12-01 ENCOUNTER — Telehealth: Payer: Self-pay | Admitting: Cardiology

## 2014-12-01 ENCOUNTER — Ambulatory Visit (INDEPENDENT_AMBULATORY_CARE_PROVIDER_SITE_OTHER): Payer: Medicare Other | Admitting: *Deleted

## 2014-12-01 ENCOUNTER — Encounter: Payer: Self-pay | Admitting: Cardiovascular Disease

## 2014-12-01 DIAGNOSIS — I455 Other specified heart block: Secondary | ICD-10-CM | POA: Diagnosis not present

## 2014-12-01 NOTE — Progress Notes (Signed)
Remote pacemaker transmission.   

## 2014-12-01 NOTE — Telephone Encounter (Signed)
LMOVM reminding pt to send remote transmission.   

## 2014-12-02 LAB — CUP PACEART REMOTE DEVICE CHECK
Battery Remaining Longevity: 80 mo
Brady Statistic AP VP Percent: 1.1 %
Brady Statistic AP VS Percent: 94 %
Brady Statistic RA Percent Paced: 92 %
Brady Statistic RV Percent Paced: 1.2 %
Date Time Interrogation Session: 20161004165320
Lead Channel Impedance Value: 290 Ohm
Lead Channel Impedance Value: 360 Ohm
Lead Channel Pacing Threshold Amplitude: 1.5 V
Lead Channel Pacing Threshold Pulse Width: 0.5 ms
Lead Channel Sensing Intrinsic Amplitude: 5.9 mV
Lead Channel Setting Pacing Amplitude: 1.875
Lead Channel Setting Pacing Amplitude: 3 V
Lead Channel Setting Pacing Pulse Width: 1 ms
Lead Channel Setting Sensing Sensitivity: 1 mV
MDC IDC MSMT BATTERY REMAINING PERCENTAGE: 95.5 %
MDC IDC MSMT BATTERY VOLTAGE: 2.98 V
MDC IDC MSMT LEADCHNL RA PACING THRESHOLD AMPLITUDE: 0.875 V
MDC IDC MSMT LEADCHNL RA SENSING INTR AMPL: 0.7 mV
MDC IDC MSMT LEADCHNL RV PACING THRESHOLD PULSEWIDTH: 1 ms
MDC IDC PG SERIAL: 7445049
MDC IDC STAT BRADY AS VP PERCENT: 1 %
MDC IDC STAT BRADY AS VS PERCENT: 4.8 %
Pulse Gen Model: 2210

## 2014-12-10 ENCOUNTER — Other Ambulatory Visit: Payer: Self-pay | Admitting: Physician Assistant

## 2014-12-10 ENCOUNTER — Other Ambulatory Visit: Payer: Self-pay | Admitting: Family Medicine

## 2014-12-10 ENCOUNTER — Encounter: Payer: Self-pay | Admitting: Cardiology

## 2014-12-10 NOTE — Telephone Encounter (Signed)
Rx(s) sent to pharmacy electronically.  

## 2015-03-10 ENCOUNTER — Encounter: Payer: Self-pay | Admitting: Family Medicine

## 2015-03-10 ENCOUNTER — Ambulatory Visit (INDEPENDENT_AMBULATORY_CARE_PROVIDER_SITE_OTHER): Payer: Medicare Other | Admitting: Family Medicine

## 2015-03-10 VITALS — BP 188/68 | HR 74 | Temp 97.8°F | Ht 64.0 in | Wt 128.1 lb

## 2015-03-10 DIAGNOSIS — Z23 Encounter for immunization: Secondary | ICD-10-CM | POA: Diagnosis not present

## 2015-03-10 DIAGNOSIS — J0100 Acute maxillary sinusitis, unspecified: Secondary | ICD-10-CM | POA: Diagnosis not present

## 2015-03-10 DIAGNOSIS — E119 Type 2 diabetes mellitus without complications: Secondary | ICD-10-CM

## 2015-03-10 DIAGNOSIS — I10 Essential (primary) hypertension: Secondary | ICD-10-CM | POA: Diagnosis not present

## 2015-03-10 DIAGNOSIS — J309 Allergic rhinitis, unspecified: Secondary | ICD-10-CM

## 2015-03-10 DIAGNOSIS — F341 Dysthymic disorder: Secondary | ICD-10-CM

## 2015-03-10 DIAGNOSIS — G894 Chronic pain syndrome: Secondary | ICD-10-CM

## 2015-03-10 DIAGNOSIS — L570 Actinic keratosis: Secondary | ICD-10-CM

## 2015-03-10 DIAGNOSIS — C44300 Unspecified malignant neoplasm of skin of unspecified part of face: Secondary | ICD-10-CM

## 2015-03-10 DIAGNOSIS — H9113 Presbycusis, bilateral: Secondary | ICD-10-CM

## 2015-03-10 LAB — POCT GLYCOSYLATED HEMOGLOBIN (HGB A1C): Hemoglobin A1C: 5.8

## 2015-03-10 MED ORDER — AMOXICILLIN-POT CLAVULANATE 875-125 MG PO TABS: 1.0000 | ORAL_TABLET | Freq: Two times a day (BID) | ORAL | Status: DC

## 2015-03-10 MED ORDER — FLUTICASONE PROPIONATE 50 MCG/ACT NA SUSP: 2.0000 | Freq: Every day | NASAL | Status: DC

## 2015-03-10 MED ORDER — HYDROCODONE-ACETAMINOPHEN 7.5-325 MG PO TABS: 1.0000 | ORAL_TABLET | Freq: Two times a day (BID) | ORAL | Status: DC | PRN

## 2015-03-10 MED ORDER — ALPRAZOLAM 1 MG PO TABS: 1.0000 mg | ORAL_TABLET | Freq: Two times a day (BID) | ORAL | Status: DC | PRN

## 2015-03-10 NOTE — Assessment & Plan Note (Signed)
Chronic worsening bilateral hearing loss in 82 yr female - Referral to Audiology for further hearing eval and potential hearing aids

## 2015-03-10 NOTE — Assessment & Plan Note (Signed)
Extensive areas on extremities with AKs considered precancerous lesions with chronic sun exposure and age. Previously opted for no further treatment. - Now with concerning lesions on face for skin CA - Referral to Dermatology

## 2015-03-10 NOTE — Assessment & Plan Note (Signed)
Elevated BP, mild improvement on manual recheck. Did not take AM meds, not adhering to regimen. Chronic wide pulse pressure prior history of hypotension. - S/p AVR, PAF  Plan:  1. Continue current BP meds - re-advised on how to correctly take (instructed daughter as well to help with meds) Benazepril 20 to 40mg  daily, Atenolol 100mg  BID, Cardizem 120mg  daily 2. Lifestyle Mods - stay active, dec salt intake, inc K+ rich vegs 3. Monitor BP at home occasionally

## 2015-03-10 NOTE — Assessment & Plan Note (Signed)
Clinically consistent with a recurrent episode of acute maxillary sinusitis (R>L) with prolonged course >2 weeks no improvement, last treated similar 03/2014 resolved with augmentin.  Plan: 1. Augmentin 875-125mg  BID x 10 days 2. Continue symptomatic treatment PRN 3. Refilled Flonase 4. RTC 2-4 weeks re-eval or sooner if treatment not improving

## 2015-03-10 NOTE — Patient Instructions (Addendum)
Dear [name], Thank you for coming in to clinic today. It was good to see you again!  1. Diabetes is well controlled. I am happy with your blood sugar numbers today. A1c (3 month sugar) is 5.8 (improved from 5.9) doing well, keep taking Metformin.  2. Sinus infection - take Augmentin 1 pill twice daily for 10 days  3. Refilled Flonase  4. Follow-up with Eye Doctor for annual diabetic eye exam and to evaluate Left eye  5. Referral to Dermatologist for Skin Cancer on face (left nostril and left ear), you will hear about this appointment  6. Referral to Audiology for Hearing Aid evaluation and management  7. Elevated Blood Pressure today, - Start taking as prescribed - Benazepril 20mg  tabs - take 2 of them for 40mg  daily - Atenolol 100mg  twice daily - Diltiazem-ER 120mg  daily (in morning) Follow-up with Cardiologist for BP  Remember to use cane , walker to avoid future falls. Be careful when standing up suddenly.  Consider Colonoscopy screening, also recommended DEXA scan to check for osteoporosis  Please schedule a follow-up appointment with Dr. Parks Ranger in 3-6 months for Diabetes and Refills chronic pain.  If you have any other questions or concerns, please feel free to call the clinic to contact me. You may also schedule an earlier appointment if necessary.  However, if your symptoms get significantly worse, please go to the Emergency Department to seek immediate medical attention.  [name], [place]

## 2015-03-10 NOTE — Progress Notes (Signed)
Subjective:    Patient ID: [name], female    DOB: 30-Jun-1929, 82 y.o.   MRN: [id number]  [name] is a 82 y.o. female presenting on 03/10/2015 for Follow-up  HPI  URI / SINUSITIS SYMPTOMS: - Reports that intermittent now worsening sinus symptoms for past 2 weeks, described as "head feels really full with pressure without headache" having some nasal congestion and rhinorrhea, also some eye drainage since improved. - No recent antibiotics. Last treated for Sinus infection in 03/2014, treated with Amoxicillin initially then evaluated at [facility] and treated with Augmentin later with resolution. - Sick contacts at home with daughter and other family members - Admits dry cough - Denies any fevers/chills (but does admit to staying cold), sweats, abdominal pain, nausea, vomiting, diarrhea, shortness of breath, chest pains  CHRONIC DM, Type 2: Reports - no concerns today. Does not follow DM diet. CBGs: Avg 135s, Low 90s, High < 200 (rarely). Checks CBGs 2x daily Meds: Metformin 1000mg  BID Reports good compliance. Tolerating well w/o side-effects Currently on ACEi - Followed by Ophthalmologist, awaiting new appointment, did not schedule since last visit. Denies hypoglycemia, polyuria, visual changes, numbness or tingling.  CHRONIC HTN: Reports - did not take anti-HTN meds today yet, had switched to taking PM. Follow-up with Cardiology 03/22/15. Current Meds - Benazepril 20mg  tabs (instructed by Cards to take 40mg  daily, has not done this), Diltiazem-ER 120mg  daily, Atenolol 100mg  BID   Reports good compliance previously, some issues with memory but daughter often helps with medications. Tolerating well, w/o complaints. Denies CP, dyspnea, HA, edema, dizziness / lightheadedness  Skin Cancer, face History of prior skin cancers treated on face and chronic sun damage. Denies known history of melanoma. Unclear onset and duration of new worsening facial lesions one on left nare and  other inside left ear. Both seem to be ulcerated and non-healing lesions. Requesting further evaluation.  PRESBYCUSIS, HEARING LOSS - Family and patient report hearing loss, thought to be both ears, chronic gradually worsening, would like to inquire about evaluation for hearing aids.  CHRONIC PAIN SYNDROME / ANXIETY - Chronic low back pain with h/o OA lumbar spine. Also with chronic anxiety. Today requests refills on Norco and Lorazepam. - Currently functionally improved while on Norco - takes up to 2x daily PRN LBP  Social History   Social History  . Marital Status: Widowed    Spouse Name: N/A  . Number of Children: 5  . Years of Education: 5   Occupational History  . Retired-Farmer    Social History Main Topics  . Smoking status: Never Smoker   . Smokeless tobacco: Current User    Types: Snuff  . Alcohol Use: No  . Drug Use: No  . Sexual Activity: Not on file   Other Topics Concern  . Not on file   Social History Narrative   Health Care POA:    Emergency Contact: daughter, [name] [phone]   End of Life Plan: gave pt ad pamphlet   Who lives with you: daughter and 2 grandchildren   Any pets: 2 dogs   Diet: pt has a varied diet of protein, starch and vegetables.   Exercise: pt garden's daily   Seatbelts: pt does not wear seatbelt because of pace maker   Sun Exposure/Protection: pt does not use sun protection, has irregular dermatology appointments.   Hobbies: gardening, sitting on porch.          Review of Systems Per HPI unless specifically indicated above  Objective:    BP 188/68 mmHg  Pulse 74  Temp(Src) 97.8 F (36.6 C) (Oral)  Ht 5\' 4"  (1.626 m)  Wt 128 lb 1.6 oz (58.106 kg)  BMI 21.98 kg/m2  Wt Readings from Last 3 Encounters:  03/10/15 128 lb 1.6 oz (58.106 kg)  09/02/14 129 lb 3.2 oz (58.605 kg)  09/01/14 130 lb (58.968 kg)    Physical Exam  Constitutional: She is oriented to person, place, and time. She appears well-developed and  well-nourished. No distress.  Elderly female, chronically ill but currently well appearing, comfortable and cooperative  HENT:  Head: Normocephalic and atraumatic.  Right Ear: External ear normal.  Left Ear: External ear normal.  Mouth/Throat: Oropharynx is clear and moist.  Bilateral TM's clear with normal landmarks. Maxillary sinuses bilateral +TTP R>L. No nasal purulence.  Eyes: Conjunctivae are normal. Right eye exhibits no discharge. Left eye exhibits no discharge.  Residual Right ear conjunctival irritation and lower eyelid inflammation without drainage or erythema, edema.  Neck: Normal range of motion. Neck supple. No thyromegaly present.  Cardiovascular: Normal rate, regular rhythm and intact distal pulses.   Murmur (Q000111Q systolic over R sternal border, s/p AVR) heard. Pulmonary/Chest: Effort normal and breath sounds normal. No respiratory distress. She has no wheezes. She has no rales.  Musculoskeletal: Normal range of motion. She exhibits no edema or tenderness.  Back: bilateral Lumbar lower paraspinal improved today with minimal tenderness to palpation, without deformity of spine, seated SLR without radicular symptoms.  Lymphadenopathy:    She has no cervical adenopathy.  Neurological: She is alert and oriented to person, place, and time.  Distal extremities intact sensation to light touch  Skin: Skin is warm and dry. No rash noted. She is not diaphoretic.  Significant extensive dry skin with chronic thickening from age and sun exposure. Suspicious lesions on face/head, 1) left nare at nasolabial fold with 1x1cm ulcerated non-healing mass. 2) left ear inside with 1-2cm ulcerated non-healing lesion.  Psychiatric: She has a normal mood and affect. Her behavior is normal.  Nursing note and vitals reviewed.      Assessment & Plan:   Problem List Items Addressed This Visit    ACTINIC KERATOSIS    Extensive areas on extremities with AKs considered precancerous lesions with chronic  sun exposure and age. Previously opted for no further treatment. - Now with concerning lesions on face for skin CA - Referral to Dermatology      Allergic rhinitis   Relevant Medications   fluticasone (FLONASE) 50 MCG/ACT nasal spray   ANXIETY DEPRESSION   Relevant Medications   ALPRAZolam (XANAX) 1 MG tablet   Chronic pain syndrome    Controlled, chronic LBP with OA - Last UDS and [facility] Pain Contract 05/2014  Plan: 1. Refilled Norco 7.5/325mg  1-2 tabs daily (#60, 0 refills) printed rx x 3 for 3 month supply 2. RTC 3 to 6 months      Relevant Medications   HYDROcodone-acetaminophen (NORCO) 7.5-325 MG tablet   Diabetes mellitus type 2, controlled ([place])    Well-controlled, stable. At goal. Current A1c 5.8 today from 5.9 (0000000) No complications or hypoglycemia  Plan: 1. Continue Metformin 1000mg  BID 2. RTC 6 months A1c - advised follow-up with Ophtho for DM eye exam      Relevant Orders   HgB A1c (Completed)   Essential hypertension, benign    Elevated BP, mild improvement on manual recheck. Did not take AM meds, not adhering to regimen. Chronic wide pulse pressure prior history of  hypotension. - S/p AVR, PAF  Plan:  1. Continue current BP meds - re-advised on how to correctly take (instructed daughter as well to help with meds) Benazepril 20 to 40mg  daily, Atenolol 100mg  BID, Cardizem 120mg  daily 2. Lifestyle Mods - stay active, dec salt intake, inc K+ rich vegs 3. 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 - Referral to Dermatology      Relevant Medications   ALPRAZolam (XANAX) 1 MG tablet   HYDROcodone-acetaminophen (NORCO) 7.5-325 MG tablet   Other Relevant Orders   Ambulatory referral to Dermatology   Presbycusis of both ears    Chronic worsening bilateral hearing loss in 82 yr female - Referral to Audiology for further hearing eval and potential hearing aids        Relevant Orders   Ambulatory referral to Audiology   Sinusitis, acute, maxillary - Primary    Clinically consistent with a recurrent episode of acute maxillary sinusitis (R>L) with prolonged course >2 weeks no improvement, last treated similar 03/2014 resolved with augmentin.  Plan: 1. Augmentin 875-125mg  BID x 10 days 2. Continue symptomatic treatment PRN 3. Refilled Flonase 4. RTC 2-4 weeks re-eval or sooner if treatment not improving      Relevant Medications   fluticasone (FLONASE) 50 MCG/ACT nasal spray   amoxicillin-clavulanate (AUGMENTIN) 875-125 MG tablet      Meds ordered this encounter  Medications  . fluticasone (FLONASE) 50 MCG/ACT nasal spray    Sig: Place 2 sprays into both nostrils daily.    Dispense:  16 g    Refill:  5  . DISCONTD: HYDROcodone-acetaminophen (NORCO) 7.5-325 MG tablet    Sig: Take 1 tablet by mouth 2 (two) times daily as needed.    Dispense:  60 tablet    Refill:  0  . ALPRAZolam (XANAX) 1 MG tablet    Sig: Take 1 tablet (1 mg total) by mouth 2 (two) times daily as needed for anxiety.    Dispense:  60 tablet    Refill:  2  . DISCONTD: HYDROcodone-acetaminophen (NORCO) 7.5-325 MG tablet    Sig: Take 1 tablet by mouth 2 (two) times daily as needed. Do not fill before 03/31/15    Dispense:  60 tablet    Refill:  0  . HYDROcodone-acetaminophen (NORCO) 7.5-325 MG tablet    Sig: Take 1 tablet by mouth 2 (two) times daily as needed. Do not fill before 03/31/15    Dispense:  60 tablet    Refill:  0    Do not fill before 04/28/15  . amoxicillin-clavulanate (AUGMENTIN) 875-125 MG tablet    Sig: Take 1 tablet by mouth 2 (two) times daily.    Dispense:  20 tablet    Refill:  0      Follow up plan: Return in about 3 months (around 06/08/2015) for blood pressure, diabetes, chronic pain.  [name], [place], PGY-3

## 2015-03-10 NOTE — Assessment & Plan Note (Signed)
Well-controlled, stable. At goal. Current A1c 5.8 today from 5.9 (0000000) No complications or hypoglycemia  Plan: 1. Continue Metformin 1000mg  BID 2. RTC 6 months A1c - advised follow-up with Ophtho for DM eye exam

## 2015-03-10 NOTE — Assessment & Plan Note (Addendum)
Controlled, chronic LBP with OA - Last UDS and [facility] Pain Contract 05/2014  Plan: 1. Refilled Norco 7.5/325mg  1-2 tabs daily (#60, 0 refills) printed rx x 3 for 3 month supply 2. RTC 3 to 6 months

## 2015-03-10 NOTE — Assessment & Plan Note (Signed)
Suspected basal cell vs squamous cell CA on left nare and left ear. - Referral to Dermatology

## 2015-03-22 ENCOUNTER — Encounter: Payer: Self-pay | Admitting: Cardiovascular Disease

## 2015-03-22 ENCOUNTER — Ambulatory Visit (INDEPENDENT_AMBULATORY_CARE_PROVIDER_SITE_OTHER): Payer: Medicare Other | Admitting: Cardiovascular Disease

## 2015-03-22 VITALS — BP 122/76 | HR 70 | Ht 64.0 in | Wt 129.7 lb

## 2015-03-22 DIAGNOSIS — I495 Sick sinus syndrome: Secondary | ICD-10-CM | POA: Diagnosis not present

## 2015-03-22 DIAGNOSIS — R899 Unspecified abnormal finding in specimens from other organs, systems and tissues: Secondary | ICD-10-CM

## 2015-03-22 DIAGNOSIS — Z954 Presence of other heart-valve replacement: Secondary | ICD-10-CM

## 2015-03-22 DIAGNOSIS — Z950 Presence of cardiac pacemaker: Secondary | ICD-10-CM

## 2015-03-22 DIAGNOSIS — I2510 Atherosclerotic heart disease of native coronary artery without angina pectoris: Secondary | ICD-10-CM

## 2015-03-22 DIAGNOSIS — I484 Atypical atrial flutter: Secondary | ICD-10-CM

## 2015-03-22 DIAGNOSIS — I10 Essential (primary) hypertension: Secondary | ICD-10-CM

## 2015-03-22 DIAGNOSIS — E785 Hyperlipidemia, unspecified: Secondary | ICD-10-CM

## 2015-03-22 DIAGNOSIS — Z952 Presence of prosthetic heart valve: Secondary | ICD-10-CM

## 2015-03-22 DIAGNOSIS — Z79899 Other long term (current) drug therapy: Secondary | ICD-10-CM

## 2015-03-22 NOTE — Progress Notes (Signed)
Patient ID: [name], female   DOB: Nov 04, 1929, 82 y.o.   MRN: [id number]     Cardiology Office Note    Date:  03/22/2015   ID:  SHIGEKO MANARD, DOB Jun 08, 1929, MRN [id number]  PCP:  [name], DO  Cardiologist:   Sanda Klein, MD   Chief Complaint  Patient presents with  . 6 month visit    AFIB//pt c/o occasional chest pressure, does not last long, dizziness, occasional swelling in bilateral lankles.    History of Present Illness:  [name] is a 82 y.o. female with a history of paroxysmal atrial fibrillation on chronic anticoagulation, sinus node dysfunction s/p dual chamber pacemaker (leads 2005, generator change 2014 [employer]), valvular heart disease with a biological aortic valve prosthesis (21 mm, 2005, Dr. Servando Snare), severe hypercholesterolemia. She has not had clinical coronary disease, but preoperative cardiac catheterization in 2005 showed 40% LAD stenosis.   She generally feels well and denies angina, dyspnea, palpitations, syncope, important edema or focal neurological complaints. She has had occasional mild ankle swelling. She denies bleeding problems. She is struggling with symptoms of an upper history tract infection and sinus congestion. She has occasional chest discomfort that is very brief.  Pacemaker interrogation shows normal device function. She has a Insurance claims handler implanted in 2014. Atrial lead parameters are excellent. Ventricular lead threshold is slightly elevated chronically. Estimated generator longevity is 3.8-9.3 years. She paces the atrium 92% of the time and only paces the ventricle 1.2% of the time. There has been no sustained atrial fibrillation since she underwent her cardioversion about a year ago. She has had a few brief episodes of mode switch that look like atypical atrial flutter. The longest one was less than 20 seconds in duration.  Her most recent echocardiogram was performed in March 2014 showed normal  bioprosthetic function and normal LVEF 55-60 percent.  Past Medical History  Diagnosis Date  . PAF (paroxysmal atrial fibrillation) ([place])   . HTN (hypertension)   . GERD (gastroesophageal reflux disease)   . Anxiety   . CAD (coronary artery disease)   . CHF (congestive heart failure) (H[facility])     EF 55% on 01/05/11, trace AR w/ Bioprosthetic valve  . Tobacco abuse   . DM2 (diabetes mellitus, type 2) ([place])   . Restless legs syndrome (RLS)   . HLD (hyperlipidemia)   . Anemia     Past Surgical History  Procedure Laterality Date  . Aortic valve replacement  08/25/2002    tissue valve  . Pacemaker insertion  04/02/2012    [employer]  . Permanent pacemaker generator change N/A 04/02/2012    Procedure: PERMANENT PACEMAKER GENERATOR CHANGE;  Surgeon: Sanda Klein, MD;  Location: [facility] CATH LAB;  Service: Cardiovascular;  Laterality: N/A;  . Cardioversion N/A 05/05/2014    Procedure: CARDIOVERSION;  Surgeon: Dorothy Spark, MD;  Location: [facility] ENDOSCOPY;  Service: Cardiovascular;  Laterality: N/A;    Outpatient Prescriptions Prior to Visit  Medication Sig Dispense Refill  . ALPRAZolam (XANAX) 1 MG tablet Take 1 tablet (1 mg total) by mouth 2 (two) times daily as needed for anxiety. 60 tablet 2  . apixaban (ELIQUIS) 2.5 MG TABS tablet Take 1 tablet (2.5 mg total) by mouth 2 (two) times daily. 60 tablet 5  . atenolol (TENORMIN) 100 MG tablet TAKE 1 TABLET (100 MG TOTAL) BY MOUTH DAILY. 90 tablet 3  . benazepril (LOTENSIN) 20 MG tablet TAKE 1 TABLET (20 MG TOTAL) BY MOUTH DAILY. 30 tablet 6  .  Blood Glucose Monitoring Suppl (ONE TOUCH ULTRA 2) W/DEVICE KIT   0  . CRESTOR 10 MG tablet TAKE 1 TABLET (10 MG TOTAL) BY MOUTH DAILY. 90 tablet 2  . diltiazem (CARDIZEM CD) 120 MG 24 hr capsule TAKE 1 CAPSULE (120 MG TOTAL) BY MOUTH DAILY. 30 capsule 9  . fluticasone (FLONASE) 50 MCG/ACT nasal spray Place 2 sprays into both nostrils daily. 16 g 5  . HYDROcodone-acetaminophen (NORCO) 7.5-325 MG tablet Take 1  tablet by mouth 2 (two) times daily as needed. Do not fill before 03/31/15 60 tablet 0  . metFORMIN (GLUCOPHAGE) 1000 MG tablet TAKE 1 TABLET BY MOUTH TWICE A DAY WITH A MEAL 60 tablet 11  . omeprazole (PRILOSEC) 20 MG capsule TAKE 1 CAPSULE (20 MG TOTAL) BY MOUTH DAILY. 90 capsule 3  . ONE TOUCH ULTRA TEST test strip TEST BLOOD SUGAR TWICE A DAY 50 each 4  . ZETIA 10 MG tablet TAKE 1 TABLET (10 MG TOTAL) BY MOUTH DAILY. 90 tablet 1  . amoxicillin-clavulanate (AUGMENTIN) 875-125 MG tablet Take 1 tablet by mouth 2 (two) times daily. 20 tablet 0  . atenolol (TENORMIN) 100 MG tablet     . docusate sodium (COLACE) 100 MG capsule   0  . escitalopram (LEXAPRO) 10 MG tablet Take 1 tablet (10 mg total) by mouth daily. 30 tablet 2  . furosemide (LASIX) 20 MG tablet TAKE 1 TABLET BY MOUTH EVERY DAY 30 tablet 6  . ZETIA 10 MG tablet TAKE 1 TABLET (10 MG TOTAL) BY MOUTH DAILY. 90 tablet 3   No facility-administered medications prior to visit.     Allergies:   Pravachol   Social History   Social History  . Marital Status: Widowed    Spouse Name: N/A  . Number of Children: 5  . Years of Education: 5   Occupational History  . Retired-Farmer    Social History Main Topics  . Smoking status: Never Smoker   . Smokeless tobacco: Current User    Types: Snuff  . Alcohol Use: No  . Drug Use: No  . Sexual Activity: Not Asked   Other Topics Concern  . None   Social History Narrative   Health Care POA:    Emergency Contact: daughter, [name] [phone]   End of Life Plan: gave pt ad pamphlet   Who lives with you: daughter and 2 grandchildren   Any pets: 2 dogs   Diet: pt has a varied diet of protein, starch and vegetables.   Exercise: pt garden's daily   Seatbelts: pt does not wear seatbelt because of pace maker   Sun Exposure/Protection: pt does not use sun protection, has irregular dermatology appointments.   Hobbies: gardening, sitting on porch.           Family History:  The patient's  family history includes Heart attack in her brother; Kidney disease in her sister.   ROS:   Please see the history of present illness.    ROS All other systems reviewed and are negative.   PHYSICAL EXAM:   VS:  BP 122/76 mmHg  Pulse 70  Ht _0  (1.626 m)  Wt 129 lb 11.2 oz (58.832 kg)  BMI 22.25 kg/m2   GEN: Well nourished, well developed, in no acute distress HEENT: normal Neck: no JVD, carotid bruits, or masses Cardiac: RRR; 2/6 early peaking aortic ejection murmur, no diastolicmurmurs, rubs, or gallops,no edema;healthy right subclavian pacemaker site  Respiratory:  clear to auscultation bilaterally, normal work of breathing GI:  soft, nontender, nondistended, + BS MS: no deformity or atrophy Skin: warm and dry, no rash Neuro:  Alert and Oriented x 3, Strength and sensation are intact Psych: euthymic mood, full affect  Wt Readings from Last 3 Encounters:  03/22/15 129 lb 11.2 oz (58.832 kg)  03/10/15 128 lb 1.6 oz (58.106 kg)  09/02/14 129 lb 3.2 oz (58.605 kg)      Studies/Labs Reviewed:   EKG:  EKG is ordered today.  The ekg ordered today demonstrates atrial paced, ventricular sensed rhythm. There is chronic mild ST segment depression in the inferior leads and V5-V6, unchanged from previous tracings.  Recent Labs: 04/02/2014: TSH 1.348 05/04/2014: BUN 26*; Creat 1.49*; Hemoglobin 9.0*; Platelets 309; Potassium 4.3; Sodium 141   Lipid Panel    Component Value Date/Time   CHOL 305* 04/06/2009 2033   TRIG 203* 04/06/2009 2033   HDL 45 04/06/2009 2033   CHOLHDL 6.8 Ratio 04/06/2009 2033   VLDL 41* 04/06/2009 2033   LDLCALC 219* 04/06/2009 2033   LDLDIRECT 42 02/10/2014 1403     ASSESSMENT:    1. Atypical atrial flutter ([place])   2. SSS (sick sinus syndrome) ([place])   3. Pacemaker   4. Coronary artery disease involving native coronary artery of native heart without angina pectoris   5. Hyperlipidemia   6. Essential hypertension, benign   7. S/P AVR (aortic valve  replacement)   8. Medication management   9. Abnormal laboratory test      PLAN:  In order of problems listed above:  1. Atrial flutter/atrial fibrillation: History of persistent atrial fibrillation requiring cardioversion roughly one year ago. Maintaining normal sinus rhythm without true antiarrhythmic therapy, just on beta blockers. On appropriate anticoagulation. CHADSVasc score 6 (age 32, gender, HBP, vascular disease, DM). 2. Sinus node dysfunction: She is not pacemaker dependent, she does have underlying sinus bradycardia 3. Normally functioning dual-chamber permanent pacemaker. Her ventricular lead has somewhat elevated pacing thresholds, the ventricular pacing only occurs roughly 1% and time. No permanent reprogramming changes necessary. We'll continue remote monitoring every 3 months. 4. CAD: Minor coronary artery disease by cardiac catheterization in 2005. No symptoms of angina pectoris. The focus is on modifying risk factors. 5. HLP: Roughly one year ago LDL cholesterol was excellent. Time to reevaluate. 6. HTN: Excellent blood pressure control 7. AVR: No clinical findings to suggest dysfunction of her biological prosthesis. Probably wise to reevaluate by echo in the next 1-2 years 8. Chronic apixaban anticoagulation: She was quite anemic last March. Time to reevaluate hemoglobin while she is on full anticoagulation therapy. Also need to recheck her renal function - has chronic kidney disease stage III.    Medication Adjustments/Labs and Tests Ordered: Current medicines are reviewed at length with the patient today.  Concerns regarding medicines are outlined above.  Medication changes, Labs and Tests ordered today are listed in the Patient Instructions below. Patient Instructions  Your physician recommends that you return for lab work in: at your [place] at [employer] [facility].  Remote monitoring is used to monitor your Pacemaker from home. This monitoring reduces the number of  office visits required to check your device to one time per year. It allows [place] to monitor the functioning of your device to ensure it is working properly. You are scheduled for a device check from home on June 21, 2015. You may send your transmission at any time that day. If you have a wireless device, the transmission will be sent automatically. After your physician reviews your  transmission, you will receive a postcard with your next transmission date.  Dr. Sallyanne Kuster recommends that you schedule a follow-up appointment in: One Year          Signed, Zivah Mayr, MD  03/22/2015 1:13 PM    [facility] [address], [place], [place]  [address] Phone: [phone]; Fax: [phone]

## 2015-03-22 NOTE — Patient Instructions (Signed)
Your physician recommends that you return for lab work in: at your [place] at [employer].  Remote monitoring is used to monitor your Pacemaker from home. This monitoring reduces the number of office visits required to check your device to one time per year. It allows [place] to monitor the functioning of your device to ensure it is working properly. You are scheduled for a device check from home on June 21, 2015. You may send your transmission at any time that day. If you have a wireless device, the transmission will be sent automatically. After your physician reviews your transmission, you will receive a postcard with your next transmission date.  Dr. Sallyanne Kuster recommends that you schedule a follow-up appointment in: One Year

## 2015-04-02 ENCOUNTER — Other Ambulatory Visit: Payer: Self-pay | Admitting: Cardiovascular Disease

## 2015-04-02 NOTE — Telephone Encounter (Signed)
Rx request sent to pharmacy.  

## 2015-04-12 ENCOUNTER — Other Ambulatory Visit: Payer: Self-pay | Admitting: Cardiovascular Disease

## 2015-04-12 ENCOUNTER — Other Ambulatory Visit: Payer: Self-pay | Admitting: *Deleted

## 2015-04-12 DIAGNOSIS — F341 Dysthymic disorder: Secondary | ICD-10-CM

## 2015-04-27 LAB — CUP PACEART INCLINIC DEVICE CHECK
Battery Remaining Percentage: 95 %
Battery Voltage: 2.98 V
Date Time Interrogation Session: 20170228134920
Implantable Lead Model: 5076
Lead Channel Setting Pacing Amplitude: 1.875
Lead Channel Setting Pacing Pulse Width: 1 ms
Lead Channel Setting Sensing Sensitivity: 1 mV
MDC IDC LEAD IMPLANT DT: 20050526
MDC IDC LEAD IMPLANT DT: 20050526
MDC IDC LEAD LOCATION: 753859
MDC IDC LEAD LOCATION: 753860
MDC IDC PG SERIAL: 7445049
MDC IDC SET LEADCHNL RV PACING AMPLITUDE: 3 V

## 2015-05-27 ENCOUNTER — Telehealth: Payer: Self-pay | Admitting: *Deleted

## 2015-05-27 MED ORDER — BENAZEPRIL HCL 20 MG PO TABS: 20.0000 mg | ORAL_TABLET | Freq: Two times a day (BID) | ORAL | Status: DC

## 2015-05-27 NOTE — Telephone Encounter (Signed)
Pt out of benazipril. Instructed by Dr. Corky Crafts to incr from 20mg  to 40mg  daily - this was never updated in med list. Did so & med refilled at daughter's request.

## 2015-06-04 DIAGNOSIS — E119 Type 2 diabetes mellitus without complications: Secondary | ICD-10-CM | POA: Diagnosis not present

## 2015-06-04 LAB — HM DIABETES EYE EXAM

## 2015-06-14 ENCOUNTER — Ambulatory Visit (INDEPENDENT_AMBULATORY_CARE_PROVIDER_SITE_OTHER): Payer: Medicare Other | Admitting: Family Medicine

## 2015-06-14 ENCOUNTER — Encounter: Payer: Self-pay | Admitting: Family Medicine

## 2015-06-14 VITALS — BP 160/82 | HR 76 | Temp 98.7°F | Ht 64.0 in | Wt 126.8 lb

## 2015-06-14 DIAGNOSIS — G894 Chronic pain syndrome: Secondary | ICD-10-CM | POA: Diagnosis not present

## 2015-06-14 DIAGNOSIS — F341 Dysthymic disorder: Secondary | ICD-10-CM | POA: Diagnosis not present

## 2015-06-14 DIAGNOSIS — E785 Hyperlipidemia, unspecified: Secondary | ICD-10-CM

## 2015-06-14 DIAGNOSIS — Z7189 Other specified counseling: Secondary | ICD-10-CM | POA: Diagnosis not present

## 2015-06-14 DIAGNOSIS — E1122 Type 2 diabetes mellitus with diabetic chronic kidney disease: Secondary | ICD-10-CM

## 2015-06-14 DIAGNOSIS — N183 Chronic kidney disease, stage 3 (moderate): Secondary | ICD-10-CM

## 2015-06-14 DIAGNOSIS — E119 Type 2 diabetes mellitus without complications: Secondary | ICD-10-CM

## 2015-06-14 DIAGNOSIS — E1169 Type 2 diabetes mellitus with other specified complication: Secondary | ICD-10-CM

## 2015-06-14 DIAGNOSIS — I10 Essential (primary) hypertension: Secondary | ICD-10-CM | POA: Diagnosis not present

## 2015-06-14 DIAGNOSIS — G8929 Other chronic pain: Secondary | ICD-10-CM

## 2015-06-14 LAB — COMPLETE METABOLIC PANEL WITH GFR
ALBUMIN: 4.3 g/dL (ref 3.6–5.1)
ALK PHOS: 42 U/L (ref 33–130)
ALT: 10 U/L (ref 6–29)
AST: 18 U/L (ref 10–35)
BUN: 20 mg/dL (ref 7–25)
CHLORIDE: 102 mmol/L (ref 98–110)
CO2: 24 mmol/L (ref 20–31)
Calcium: 9.3 mg/dL (ref 8.6–10.4)
Creat: 1.27 mg/dL — ABNORMAL HIGH (ref 0.60–0.88)
GFR, EST NON AFRICAN AMERICAN: 39 mL/min — AB (ref >=60)
GFR, Est African American: 45 mL/min — ABNORMAL LOW (ref >=60)
GLUCOSE: 85 mg/dL (ref 65–99)
POTASSIUM: 4.2 mmol/L (ref 3.5–5.3)
Sodium: 140 mmol/L (ref 135–146)
Total Bilirubin: 0.3 mg/dL (ref 0.2–1.2)
Total Protein: 7.1 g/dL (ref 6.1–8.1)

## 2015-06-14 LAB — LDL CHOLESTEROL, DIRECT: Direct LDL: 141 mg/dL — ABNORMAL HIGH (ref <130)

## 2015-06-14 LAB — POCT GLYCOSYLATED HEMOGLOBIN (HGB A1C): HEMOGLOBIN A1C: 5.6

## 2015-06-14 MED ORDER — HYDROCODONE-ACETAMINOPHEN 7.5-325 MG PO TABS: 1.0000 | ORAL_TABLET | Freq: Two times a day (BID) | ORAL | Status: DC | PRN

## 2015-06-14 MED ORDER — ALPRAZOLAM 1 MG PO TABS: 1.0000 mg | ORAL_TABLET | Freq: Two times a day (BID) | ORAL | Status: DC | PRN

## 2015-06-14 NOTE — Progress Notes (Signed)
Subjective:    Patient ID: [name], female    DOB: 1929/04/26, 82 y.o.   MRN: [id number]  [name] is a 82 y.o. female presenting on 06/14/2015 for Diabetes  HPI  CHRONIC DM, Type 2: Reports - no concerns today. Does not follow DM diet. CBGs: Avg 120-130s, Low 90s, High < 200 (rarely). Checks CBGs 2x daily Meds: Metformin 1000mg  BID Reports good compliance. Tolerating well w/o side-effects Currently on ACEi - Saw Ophtho, awaiting DM eye exam report Denies hypoglycemia, polyuria, visual changes, numbness or tingling.  CHRONIC HTN: Reports - sometimes anxiety makes BP increase, checks frequently at home but unsure if cuff is accurate Current Meds - Benazepril 20mg  BID (ran out for few days to week previously, couldn't get refilled), Diltiazem-ER 120mg  daily, Atenolol 100mg  BID   Reports good compliance previously, some issues with memory but daughter often helps with medications. Tolerating well, w/o complaints. Denies CP, dyspnea, HA, edema, dizziness / lightheadedness  CHRONIC PAIN SYNDROME / ANXIETY - Chronic low back pain with h/o OA lumbar spine. Also with chronic anxiety. Today requests refills on Norco and Lorazepam. - Currently functionally improved while on Norco - takes up to 2x daily PRN LBP - Reports that often will get anxious, now staying home alone in senior apartment, daughter is nearby within walking distance. She will move back in with daughter once son goes off to college. - Not interested in any medicines for depression today - Good family support - Denies any suicidal or homicidal ideation, insomnia, decreased appetite   Social History  Substance Use Topics  . Smoking status: Never Smoker   . Smokeless tobacco: Current User    Types: Snuff  . Alcohol Use: No    Review of Systems Per HPI unless specifically indicated above     Objective:    BP 160/82 mmHg  Pulse 76  Temp(Src) 98.7 F (37.1 C) (Oral)  Ht 5\' 4"  (1.626 m)  Wt  126 lb 12.8 oz (57.516 kg)  BMI 21.75 kg/m2  Wt Readings from Last 3 Encounters:  06/14/15 126 lb 12.8 oz (57.516 kg)  03/22/15 129 lb 11.2 oz (58.832 kg)  03/10/15 128 lb 1.6 oz (58.106 kg)    Physical Exam  Constitutional: She is oriented to person, place, and time. She appears well-developed and well-nourished. No distress.  Elderly female, curently well appearing, comfortable  HENT:  Head: Normocephalic and atraumatic.  Mouth/Throat: Oropharynx is clear and moist.  Cardiovascular: Normal rate, regular rhythm and intact distal pulses.   Murmur (stable [id number] systolic over R sternal border, s/p AVR) heard. Pulmonary/Chest: Effort normal and breath sounds normal. No respiratory distress. She has no wheezes. She has no rales.  Musculoskeletal: Normal range of motion. She exhibits no edema or tenderness.  Back: bilateral Lumbar lower paraspinal non-tender, without deformity of spine, seated SLR without radicular symptoms.  Neurological: She is alert and oriented to person, place, and time.  Distal extremities intact sensation to light touch  Skin: Skin is warm and dry. No rash noted. She is not diaphoretic.  Significant extensive dry skin with chronic thickening from age and sun exposure. Still present left nare non-healing ulceration.  Nursing note and vitals reviewed.  DM FOOT EXAM - extensive dry skin appearance, no lesions, calluses, or ulcers, intact sensation to monofilament bilaterally      Assessment & Plan:   Problem List Items Addressed This Visit    Hyperlipidemia associated with type 2 diabetes mellitus ([place])    Check  direct LDL (non-fasting) prior range from 40 to 140 - Results with LDL 141 - Continue Crestor and Zetia - Will taper down on Metformin first before considering offer inc Crestor      Essential hypertension, benign    Persistent elevated BP, mild improvement on manual recheck. Chronic wide pulse pressure, seems adhering to meds. Goal < 150/90 given prior  history of hypotension - S/p AVR, PAF  Plan:  1. Continue current BP meds - Benazepril 20mg  BID (consider switching to 40mg  daily instead, Atenolol 100mg  BID, Cardizem 120mg  daily 2. Lifestyle Mods - stay active, dec salt intake, inc K+ rich vegs 3. Monitor BP at home occasionally      Relevant Orders   COMPLETE METABOLIC PANEL WITH GFR (Completed)   Encounter for chronic pain management   Relevant Orders   Drug Scr Ur, Pain Mgmt, Reflex Conf (Completed)   Diabetes mellitus type 2, controlled ([place]) - Primary    Well-controlled, stable. At goal. Current A1c 5.6 down from 5.8 Complication with CKD-III, no hypoglycemia  Plan: 1. Reduce Metformin from 1000mg  to 500mg  BID - given stable very well controlled DM and advancing age with CKD-III GFR remains >30 2. RTC 6 months A1c - advised follow-up with Ophtho for DM eye exam      Relevant Orders   HgB A1c (Completed)   COMPLETE METABOLIC PANEL WITH GFR (Completed)   LDL cholesterol, direct (Completed)   CKD stage 3 due to type 2 diabetes mellitus (HCC)    Secondary to DM2 and HTN, also with advancing age - Check CMET, results Cr 1.2 with GFR >30 - Reduce Metformin to 500 BID      Chronic pain syndrome    Controlled, chronic LBP with OA - Updated UDS and [place] Pain Contract 06/14/15  Plan: 1. Refilled Norco 7.5/325mg  1-2 tabs daily (#60, 0 refills) printed rx x 3 for 3 month supply 2. RTC 3 to 6 months      Relevant Medications   HYDROcodone-acetaminophen (NORCO) 7.5-325 MG tablet   Other Relevant Orders   Drug Scr Ur, Pain Mgmt, Reflex Conf (Completed)   ANXIETY DEPRESSION    Stable adjustment disorder vs depression with continued changing family situation, not living with daughter at the moment, will return once her son leaves for college. - Anxiety controlled with xanax chronically - Prior trial on Lexapro unclear exactly why discontinued (08/2014 started) - PHQ2: score 0  Plan: 1. Refill Xanax x 3 months, advised future  plans to consider taper and may try alternative SSRI for better symptom control 2. Anticipate improvement once returns to live with daughter 3. Follow-up with Geriatric Depression Tool and may refer to Geriatric Clinic at [facility] for further eval      Relevant Medications   ALPRAZolam (XANAX) 1 MG tablet      Meds ordered this encounter  Medications  . ALPRAZolam (XANAX) 1 MG tablet    Sig: Take 1 tablet (1 mg total) by mouth 2 (two) times daily as needed for anxiety.    Dispense:  60 tablet    Refill:  2  . DISCONTD: HYDROcodone-acetaminophen (NORCO) 7.5-325 MG tablet    Sig: Take 1 tablet by mouth 2 (two) times daily as needed. Do not fill before 03/31/15    Dispense:  60 tablet    Refill:  0  . DISCONTD: HYDROcodone-acetaminophen (NORCO) 7.5-325 MG tablet    Sig: Take 1 tablet by mouth 2 (two) times daily as needed.    Dispense:  60 tablet  Refill:  0  . DISCONTD: HYDROcodone-acetaminophen (NORCO) 7.5-325 MG tablet    Sig: Take 1 tablet by mouth 2 (two) times daily as needed.    Dispense:  60 tablet    Refill:  0    Do not fill before 07/14/15  . HYDROcodone-acetaminophen (NORCO) 7.5-325 MG tablet    Sig: Take 1 tablet by mouth 2 (two) times daily as needed.    Dispense:  60 tablet    Refill:  0    Do not fill before 08/14/15      Follow up plan: Return in about 3 months (around 09/13/2015) for diabetes, blood pressure, chronic pain.  [name], [place], PGY-3

## 2015-06-14 NOTE — Patient Instructions (Signed)
Dear [name], Thank you for coming in to clinic today. It was good to see you again!  1. Diabetes is well controlled. I am happy with your blood sugar numbers today. A1c (3 month sugar) is 5.6 (improved from 5.9) doing well, keep taking Metformin. - Go ahead and start reduced dose Metformin down to half pill 500mg  twice daily  2. Elevated Blood Pressure today, - Continue Benazepril 20mg  tabs - take one twice daily - (you have refills for next few months) - Atenolol 100mg  twice daily - Diltiazem-ER 120mg  daily (in morning) Follow-up with Cardiologist for BP and additional lab work  Remember to use cane , walker to avoid future falls. Be careful when standing up suddenly.  Consider Colonoscopy screening, also recommended DEXA scan to check for osteoporosis  Please schedule a follow-up appointment with Dr. Parks Ranger in 3-6 months for Diabetes and Refills chronic pain.  If you have any other questions or concerns, please feel free to call the clinic to contact me. You may also schedule an earlier appointment if necessary.  However, if your symptoms get significantly worse, please go to the Emergency Department to seek immediate medical attention.  [name], [place]

## 2015-06-15 DIAGNOSIS — N183 Chronic kidney disease, stage 3 unspecified: Secondary | ICD-10-CM | POA: Insufficient documentation

## 2015-06-15 DIAGNOSIS — E1122 Type 2 diabetes mellitus with diabetic chronic kidney disease: Secondary | ICD-10-CM | POA: Insufficient documentation

## 2015-06-15 LAB — DRUG SCR UR, PAIN MGMT, REFLEX CONF
Amphetamine Screen, Ur: NEGATIVE
Barbiturate Quant, Ur: NEGATIVE
Cocaine Metabolites: NEGATIVE
Creatinine,U: 19.67 mg/dL
METHADONE: NEGATIVE
Marijuana Metabolite: NEGATIVE
OPIATES: NEGATIVE
Phencyclidine (PCP): NEGATIVE
Propoxyphene: NEGATIVE

## 2015-06-15 NOTE — Assessment & Plan Note (Signed)
Controlled, chronic LBP with OA - Updated UDS and [place] Pain Contract 06/14/15  Plan: 1. Refilled Norco 7.5/325mg  1-2 tabs daily (#60, 0 refills) printed rx x 3 for 3 month supply 2. RTC 3 to 6 months

## 2015-06-15 NOTE — Assessment & Plan Note (Signed)
Secondary to DM2 and HTN, also with advancing age - Check CMET, results Cr 1.2 with GFR >30 - Reduce Metformin to 500 BID

## 2015-06-15 NOTE — Assessment & Plan Note (Signed)
Stable adjustment disorder vs depression with continued changing family situation, not living with daughter at the moment, will return once her son leaves for college. - Anxiety controlled with xanax chronically - Prior trial on Lexapro unclear exactly why discontinued (08/2014 started) - PHQ2: score 0  Plan: 1. Refill Xanax x 3 months, advised future plans to consider taper and may try alternative SSRI for better symptom control 2. Anticipate improvement once returns to live with daughter 3. Follow-up with Geriatric Depression Tool and may refer to Geriatric Clinic at [facility] for further eval

## 2015-06-15 NOTE — Assessment & Plan Note (Signed)
Persistent elevated BP, mild improvement on manual recheck. Chronic wide pulse pressure, seems adhering to meds. Goal < 150/90 given prior history of hypotension - S/p AVR, PAF  Plan:  1. Continue current BP meds - Benazepril 20mg  BID (consider switching to 40mg  daily instead, Atenolol 100mg  BID, Cardizem 120mg  daily 2. Lifestyle Mods - stay active, dec salt intake, inc K+ rich vegs 3. Monitor BP at home occasionally

## 2015-06-15 NOTE — Assessment & Plan Note (Signed)
Well-controlled, stable. At goal. Current A1c 5.6 down from 5.8 Complication with CKD-III, no hypoglycemia  Plan: 1. Reduce Metformin from 1000mg  to 500mg  BID - given stable very well controlled DM and advancing age with CKD-III GFR remains >30 2. RTC 6 months A1c - advised follow-up with Ophtho for DM eye exam

## 2015-06-15 NOTE — Assessment & Plan Note (Signed)
Check direct LDL (non-fasting) prior range from 40 to 140 - Results with LDL 141 - Continue Crestor and Zetia - Will taper down on Metformin first before considering offer inc Crestor

## 2015-06-18 LAB — BENZODIAZEPINES (GC/LC/MS), URINE
ALPRAZOLAMU: 87 ng/mL — AB (ref <25)
Clonazepam metabolite (GC/LC/MS), ur confirm: NEGATIVE ng/mL (ref <25)
Flurazepam metabolite (GC/LC/MS), ur confirm: NEGATIVE ng/mL (ref <50)
LORAZEPAMU: NEGATIVE ng/mL (ref <50)
MIDAZOLAMU: NEGATIVE ng/mL (ref <50)
Nordiazepam (GC/LC/MS), ur confirm: NEGATIVE ng/mL (ref <50)
OXAZEPAMU: NEGATIVE ng/mL (ref <50)
TEMAZEPAMU: NEGATIVE ng/mL (ref <50)
Triazolam metabolite (GC/LC/MS), ur confirm: NEGATIVE ng/mL (ref <50)

## 2015-06-21 ENCOUNTER — Other Ambulatory Visit: Payer: Self-pay | Admitting: Family Medicine

## 2015-06-21 ENCOUNTER — Ambulatory Visit (INDEPENDENT_AMBULATORY_CARE_PROVIDER_SITE_OTHER): Payer: Medicare Other | Admitting: *Deleted

## 2015-06-21 ENCOUNTER — Telehealth: Payer: Self-pay | Admitting: Cardiology

## 2015-06-21 DIAGNOSIS — I495 Sick sinus syndrome: Secondary | ICD-10-CM | POA: Diagnosis not present

## 2015-06-21 DIAGNOSIS — K219 Gastro-esophageal reflux disease without esophagitis: Secondary | ICD-10-CM

## 2015-06-21 NOTE — Progress Notes (Signed)
Remote pacemaker transmission.   

## 2015-06-21 NOTE — Telephone Encounter (Signed)
LMOVM reminding pt to send remote transmission.   

## 2015-07-17 ENCOUNTER — Other Ambulatory Visit: Payer: Self-pay | Admitting: Cardiovascular Disease

## 2015-07-19 NOTE — Telephone Encounter (Signed)
Rx(s) sent to pharmacy electronically.  

## 2015-07-26 LAB — CUP PACEART REMOTE DEVICE CHECK
Battery Remaining Longevity: 91 mo
Brady Statistic AS VP Percent: 1 %
Brady Statistic AS VS Percent: 4.9 %
Brady Statistic RA Percent Paced: 90 %
Implantable Lead Implant Date: 20050526
Implantable Lead Location: 753859
Implantable Lead Location: 753860
Implantable Lead Model: 5076
Lead Channel Impedance Value: 360 Ohm
Lead Channel Pacing Threshold Amplitude: 1 V
Lead Channel Pacing Threshold Pulse Width: 0.5 ms
Lead Channel Pacing Threshold Pulse Width: 1 ms
Lead Channel Sensing Intrinsic Amplitude: 6 mV
Lead Channel Setting Pacing Amplitude: 2.5 V
MDC IDC LEAD IMPLANT DT: 20050526
MDC IDC MSMT BATTERY REMAINING PERCENTAGE: 95.5 %
MDC IDC MSMT BATTERY VOLTAGE: 2.98 V
MDC IDC MSMT LEADCHNL RA SENSING INTR AMPL: 1.3 mV
MDC IDC MSMT LEADCHNL RV IMPEDANCE VALUE: 260 Ohm
MDC IDC MSMT LEADCHNL RV PACING THRESHOLD AMPLITUDE: 1 V
MDC IDC SESS DTM: 20170424200518
MDC IDC SET LEADCHNL RA PACING AMPLITUDE: 2 V
MDC IDC SET LEADCHNL RV PACING PULSEWIDTH: 1 ms
MDC IDC SET LEADCHNL RV SENSING SENSITIVITY: 1 mV
MDC IDC STAT BRADY AP VP PERCENT: 1.6 %
MDC IDC STAT BRADY AP VS PERCENT: 93 %
MDC IDC STAT BRADY RV PERCENT PACED: 1.8 %
Pulse Gen Model: 2210
Pulse Gen Serial Number: 7445049

## 2015-07-26 NOTE — Progress Notes (Signed)
Normal remote reviewed. <1% AF, +Eliquis  Next Merlin 09/20/15

## 2015-07-30 ENCOUNTER — Encounter: Payer: Self-pay | Admitting: Cardiology

## 2015-09-02 ENCOUNTER — Ambulatory Visit (INDEPENDENT_AMBULATORY_CARE_PROVIDER_SITE_OTHER): Payer: Medicare Other | Admitting: Family Medicine

## 2015-09-02 ENCOUNTER — Encounter: Payer: Self-pay | Admitting: Family Medicine

## 2015-09-02 VITALS — BP 156/66 | HR 77 | Temp 98.2°F | Wt 126.0 lb

## 2015-09-02 DIAGNOSIS — G2581 Restless legs syndrome: Secondary | ICD-10-CM | POA: Diagnosis not present

## 2015-09-02 DIAGNOSIS — I10 Essential (primary) hypertension: Secondary | ICD-10-CM

## 2015-09-02 DIAGNOSIS — H9190 Unspecified hearing loss, unspecified ear: Secondary | ICD-10-CM | POA: Insufficient documentation

## 2015-09-02 DIAGNOSIS — L570 Actinic keratosis: Secondary | ICD-10-CM

## 2015-09-02 DIAGNOSIS — E113299 Type 2 diabetes mellitus with mild nonproliferative diabetic retinopathy without macular edema, unspecified eye: Secondary | ICD-10-CM

## 2015-09-02 DIAGNOSIS — C44300 Unspecified malignant neoplasm of skin of unspecified part of face: Secondary | ICD-10-CM | POA: Diagnosis not present

## 2015-09-02 DIAGNOSIS — E119 Type 2 diabetes mellitus without complications: Secondary | ICD-10-CM | POA: Diagnosis not present

## 2015-09-02 DIAGNOSIS — Z954 Presence of other heart-valve replacement: Secondary | ICD-10-CM

## 2015-09-02 DIAGNOSIS — H9193 Unspecified hearing loss, bilateral: Secondary | ICD-10-CM

## 2015-09-02 DIAGNOSIS — Z952 Presence of prosthetic heart valve: Secondary | ICD-10-CM

## 2015-09-02 DIAGNOSIS — G894 Chronic pain syndrome: Secondary | ICD-10-CM

## 2015-09-02 DIAGNOSIS — F341 Dysthymic disorder: Secondary | ICD-10-CM

## 2015-09-02 LAB — BASIC METABOLIC PANEL WITH GFR
BUN: 21 mg/dL (ref 7–25)
CALCIUM: 9.1 mg/dL (ref 8.6–10.4)
CO2: 24 mmol/L (ref 20–31)
CREATININE: 1.27 mg/dL — AB (ref 0.60–0.88)
Chloride: 106 mmol/L (ref 98–110)
GFR, EST AFRICAN AMERICAN: 45 mL/min — AB (ref >=60)
GFR, Est Non African American: 39 mL/min — ABNORMAL LOW (ref >=60)
GLUCOSE: 110 mg/dL — AB (ref 65–99)
POTASSIUM: 4.1 mmol/L (ref 3.5–5.3)
Sodium: 141 mmol/L (ref 135–146)

## 2015-09-02 LAB — CBC
HEMATOCRIT: 27.7 % — AB (ref 35.0–45.0)
Hemoglobin: 8.3 g/dL — ABNORMAL LOW (ref 11.7–15.5)
MCH: 21.7 pg — AB (ref 27.0–33.0)
MCHC: 30 g/dL — AB (ref 32.0–36.0)
MCV: 72.3 fL — ABNORMAL LOW (ref 80.0–100.0)
MPV: 9.6 fL (ref 7.5–12.5)
Platelets: 239 10*3/uL (ref 140–400)
RBC: 3.83 MIL/uL (ref 3.80–5.10)
RDW: 18.6 % — AB (ref 11.0–15.0)
WBC: 9.1 10*3/uL (ref 3.8–10.8)

## 2015-09-02 LAB — FOLATE: Folate: 14.4 ng/mL (ref >5.4)

## 2015-09-02 LAB — POCT GLYCOSYLATED HEMOGLOBIN (HGB A1C): Hemoglobin A1C: 5.7

## 2015-09-02 LAB — IRON AND TIBC
%SAT: 7 % — ABNORMAL LOW (ref 11–50)
IRON: 29 ug/dL — AB (ref 45–160)
TIBC: 441 ug/dL (ref 250–450)
UIBC: 412 ug/dL — ABNORMAL HIGH (ref 125–400)

## 2015-09-02 LAB — VITAMIN B12: VITAMIN B 12: 238 pg/mL (ref 200–1100)

## 2015-09-02 MED ORDER — METFORMIN HCL 500 MG PO TABS: 500.0000 mg | ORAL_TABLET | Freq: Two times a day (BID) | ORAL | Status: DC

## 2015-09-02 MED ORDER — HYDROCODONE-ACETAMINOPHEN 7.5-325 MG PO TABS: 1.0000 | ORAL_TABLET | Freq: Two times a day (BID) | ORAL | Status: DC | PRN

## 2015-09-02 MED ORDER — ALPRAZOLAM 1 MG PO TABS: 1.0000 mg | ORAL_TABLET | Freq: Two times a day (BID) | ORAL | Status: DC | PRN

## 2015-09-02 NOTE — Assessment & Plan Note (Signed)
Above goal today, patient reports meaning the Dr. stressful Denies any shortness of breath or chest pain or headaches or vision changes Continue current medications Follow-up in one month and at upcoming cardiology appointment

## 2015-09-02 NOTE — Assessment & Plan Note (Addendum)
Refilled Xanax Follow-up in one month PH Q9 today reveals score of 20 and somewhat difficult Consider remeron in the future

## 2015-09-02 NOTE — Assessment & Plan Note (Signed)
Check labs today for other causes of restless leg syndrome including vitamin B-12, iron panel, folate, CBC, BMP Follow-up in one month

## 2015-09-02 NOTE — Assessment & Plan Note (Signed)
Well-controlled with A1c of 5.7 Decrease metformin to 500 mg twice a day Follow-up in 3 months and consider discontinuing metformin at that time

## 2015-09-02 NOTE — Assessment & Plan Note (Signed)
Referral to audiology for discussion of possible hearing aids

## 2015-09-02 NOTE — Patient Instructions (Signed)
Your diabetes is under great control with an A1c of 5.7. You can stop checking her blood sugars if he would like. Decrease her metformin to 500 mg twice a day. We will follow-up on this in 3 months. If it is still there at that time, we could consider stopping her metformin altogether.  We are getting some labs today to check for other causes for restless leg syndrome. Someone will call you or send you a letter about these results when they're available. I'll see back in one month's follow-up on this.  I put in referral for dermatology and audiology. Someone should call you about these within the next 2 weeks. If you do not hear from [place] in the next 2 weeks, please call us and let us know.  We are making you a sooner appointment with cardiology to discuss eliquis versus Coumadin.  I need to see you back in one month about your pain and nerve pills. I've given you a refill to make it to that appointment.  Your blood pressures elevated today, but I know the you find it stressful to medical new doctor. Continue current medications and we will follow-up in one month.  Take care, Dr. Jacinto Reap

## 2015-09-02 NOTE — Assessment & Plan Note (Signed)
Continue eliquis currently Patient is dissatisfied with her new medication change and feels as though a lot of her symptoms are related to her eliquis, though they are not likely related Reasonable for patient to get back on Coumadin as she does well on this for many years and does not mind INR checks Appointment with cardiology medicine next month

## 2015-09-02 NOTE — Progress Notes (Signed)
Subjective:   [name] is a 82 y.o. female with a history of HTN, T2 DM, chronic pain syndrome, anxiety here for diabetes follow-up  T2DM - Checking BG at home: Yes, checks twice daily, avg 130 - Medications: Metformin 1000 mg twice a day - Compliance: Good - Diet: Does not follow diabetes diet, eats whatever she wants to eat - Exercise: none - eye exam: 05/2015 - foot exam: 05/2015 - microalbumin: NA, ace inhibitor - denies symptoms of hypoglycemia, polyuria, polydipsia  Cardiology changed from warfarin to eliquis for anti-coag recently - bruising worsening and feeling "strange" - feels like weakness is worse since changing anti-coag - takes longer to get dressed in AM - reports she was doing well on coumadin for ~12 yrs - cardiology thought it would be less hassle without INR checks - she didn't mind INR checks  Feels that hearing is getting a lot worse - TV is blaring all the time - never worn hearing aids - never seen audiology  RLS - worse at night -feels as though she has to move - never taken anything for this - duaghter takes some medication for RLS, thinks its Requip - gets up and walks around intermittently to help - no pain with ambulation - no numbness/tingling  Depression/anxiety - per daughter, Acting sad all the time - Not eating as well as usual - Not doing as much around the house as usual - Seems to have happened since starting eliquis - Very resistant to talking to psychology or taking any medications - Patient reports occasionally feeling down - Patient  Denies SI/HI  Skin changes - several non-healing Wounds on left malar, left ear, right forearm - Appear she was previously referred to dermatology, but she reports she did not see them - Multiple rough and dry 5 including cutaneous horns on arms and face  Review of Systems:  Per HPI.   Social History: Never smoker. Living with her daughter  Objective:  BP 156/66 mmHg  Pulse 77   Temp(Src) 98.2 F (36.8 C) (Oral)  Wt 126 lb (57.153 kg)  SpO2 97%  Gen:  82 y.o. female in NAD, Pleasant and interactive HEENT: NCAT, MMM, EOMI, PERRL, anicteric sclerae CV: RRR, no MRG Resp: Non-labored, CTAB, no wheezes noted Abd: Soft, NTND, BS present, no guarding or organomegaly Ext: WWP, no edema MSK: No obvious deformities, gait intact Skin: Multiple actinic keratosis over face and arms, nonhealing ulcerated areas on left knee are and left ear, raised 1 cm skin colored lesion on right forearm Neuro: Alert and oriented, speech normal      Chemistry      Component Value Date/Time   NA 140 06/14/2015 1507   K 4.2 06/14/2015 1507   CL 102 06/14/2015 1507   CO2 24 06/14/2015 1507   BUN 20 06/14/2015 1507   CREATININE 1.27* 06/14/2015 1507   CREATININE 1.01 11/13/2009 1538      Component Value Date/Time   CALCIUM 9.3 06/14/2015 1507   ALKPHOS 42 06/14/2015 1507   AST 18 06/14/2015 1507   ALT 10 06/14/2015 1507   BILITOT 0.3 06/14/2015 1507      Lab Results  Component Value Date   WBC 9.6 05/04/2014   HGB 9.0* 05/04/2014   HCT 30.1* 05/04/2014   MCV 77.8* 05/04/2014   PLT 309 05/04/2014   Lab Results  Component Value Date   TSH 1.348 04/02/2014   Lab Results  Component Value Date   HGBA1C 5.7 09/02/2015  Assessment & Plan:     [name] is a 82 y.o. female here for   Essential hypertension, benign Above goal today, patient reports meaning the Dr. stressful Denies any shortness of breath or chest pain or headaches or vision changes Continue current medications Follow-up in one month and at upcoming cardiology appointment  Diabetes mellitus type 2, controlled ([place]) Well-controlled with A1c of 5.7 Decrease metformin to 500 mg twice a day Follow-up in 3 months and consider discontinuing metformin at that time  Hearing loss Referral to audiology for discussion of possible hearing aids  ACTINIC KERATOSIS Actinic keratosis over most of face  and arms Referral to dermatology  Malignant neoplasm skin of face Nonhealing and repeatedly bleeding wounds to left greater, left ear, right forearm Concern for basal cell versus squamous cell carcinoma Referral to dermatology  ANXIETY DEPRESSION Refilled Xanax Follow-up in one month PH Q9 today reveals score of 20 and somewhat difficult Consider remeron in the future  Chronic pain syndrome 1 refill of Norco given today Follow-up in one month to discuss more in depth  S/P AVR (aortic valve replacement) Continue eliquis currently Patient is dissatisfied with her new medication change and feels as though a lot of her symptoms are related to her eliquis, though they are not likely related Reasonable for patient to get back on Coumadin as she does well on this for many years and does not mind INR checks Appointment with cardiology medicine next month  Restless leg syndrome Check labs today for other causes of restless leg syndrome including vitamin B-12, iron panel, folate, CBC, BMP Follow-up in one month     Virginia Crews, MD MPH PGY-3,  [facility] 09/02/2015  11:38 AM

## 2015-09-02 NOTE — Assessment & Plan Note (Signed)
Nonhealing and repeatedly bleeding wounds to left greater, left ear, right forearm Concern for basal cell versus squamous cell carcinoma Referral to dermatology

## 2015-09-02 NOTE — Assessment & Plan Note (Signed)
Actinic keratosis over most of face and arms Referral to dermatology

## 2015-09-02 NOTE — Assessment & Plan Note (Signed)
1 refill of Norco given today Follow-up in one month to discuss more in depth

## 2015-09-03 ENCOUNTER — Encounter: Payer: Self-pay | Admitting: Family Medicine

## 2015-09-03 MED ORDER — FERROUS SULFATE 325 (65 FE) MG PO TABS: 325.0000 mg | ORAL_TABLET | Freq: Two times a day (BID) | ORAL | Status: DC

## 2015-09-03 NOTE — Addendum Note (Signed)
Addended by: Virginia Crews on: 09/03/2015 02:50 PM   Modules accepted: Orders

## 2015-09-10 DIAGNOSIS — L570 Actinic keratosis: Secondary | ICD-10-CM | POA: Diagnosis not present

## 2015-09-10 DIAGNOSIS — C44622 Squamous cell carcinoma of skin of right upper limb, including shoulder: Secondary | ICD-10-CM | POA: Diagnosis not present

## 2015-09-10 DIAGNOSIS — C44311 Basal cell carcinoma of skin of nose: Secondary | ICD-10-CM | POA: Diagnosis not present

## 2015-09-10 DIAGNOSIS — L578 Other skin changes due to chronic exposure to nonionizing radiation: Secondary | ICD-10-CM | POA: Diagnosis not present

## 2015-09-13 ENCOUNTER — Telehealth: Payer: Self-pay | Admitting: Cardiovascular Disease

## 2015-09-13 NOTE — Telephone Encounter (Signed)
New message  Pt dtr would like to know can her mom come off the blood thinner for surgery. Please call.

## 2015-09-13 NOTE — Telephone Encounter (Signed)
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 They are asking if she can be off eliquis for 3 days prior to the procedure. If not able to stop eliquis, procedure can be done on coag med. Will forward for dr croitoru's review

## 2015-09-14 NOTE — Telephone Encounter (Signed)
OK to stop Eliquis for the procedure, but 48 hours should be enough MCr

## 2015-09-14 NOTE — Telephone Encounter (Signed)
Spoke with pt dtr, aware of dr croitoru's recommendations.

## 2015-09-15 DIAGNOSIS — C44311 Basal cell carcinoma of skin of nose: Secondary | ICD-10-CM | POA: Diagnosis not present

## 2015-09-17 DIAGNOSIS — C44629 Squamous cell carcinoma of skin of left upper limb, including shoulder: Secondary | ICD-10-CM | POA: Diagnosis not present

## 2015-09-20 ENCOUNTER — Encounter: Payer: Medicare Other | Admitting: *Deleted

## 2015-09-20 ENCOUNTER — Telehealth: Payer: Self-pay | Admitting: Cardiology

## 2015-09-20 NOTE — Telephone Encounter (Signed)
LMOVM reminding pt to send remote transmission.   

## 2015-09-22 ENCOUNTER — Ambulatory Visit: Payer: Medicare Other | Admitting: Nurse Practitioner

## 2015-09-23 ENCOUNTER — Encounter: Payer: Self-pay | Admitting: *Deleted

## 2015-09-23 DIAGNOSIS — R8299 Other abnormal findings in urine: Secondary | ICD-10-CM | POA: Diagnosis not present

## 2015-09-23 DIAGNOSIS — N399 Disorder of urinary system, unspecified: Secondary | ICD-10-CM | POA: Diagnosis not present

## 2015-09-23 DIAGNOSIS — M545 Low back pain: Secondary | ICD-10-CM | POA: Diagnosis not present

## 2015-09-24 ENCOUNTER — Encounter: Payer: Self-pay | Admitting: Cardiology

## 2015-09-25 ENCOUNTER — Other Ambulatory Visit: Payer: Self-pay | Admitting: Cardiovascular Disease

## 2015-09-25 ENCOUNTER — Other Ambulatory Visit: Payer: Self-pay | Admitting: Family Medicine

## 2015-09-25 DIAGNOSIS — J309 Allergic rhinitis, unspecified: Secondary | ICD-10-CM

## 2015-09-27 NOTE — Telephone Encounter (Signed)
REFILL 

## 2015-10-05 ENCOUNTER — Ambulatory Visit: Payer: Medicare Other | Admitting: Internal Medicine

## 2015-10-11 ENCOUNTER — Encounter: Payer: Medicare Other | Admitting: *Deleted

## 2015-10-14 ENCOUNTER — Other Ambulatory Visit: Payer: Self-pay | Admitting: Cardiovascular Disease

## 2015-10-14 NOTE — Telephone Encounter (Signed)
REFILL 

## 2015-10-18 ENCOUNTER — Ambulatory Visit: Payer: Medicare Other | Admitting: Internal Medicine

## 2015-10-27 ENCOUNTER — Ambulatory Visit: Payer: Medicare Other | Admitting: Internal Medicine

## 2015-10-28 ENCOUNTER — Encounter: Payer: Self-pay | Admitting: Family Medicine

## 2015-10-28 ENCOUNTER — Ambulatory Visit (INDEPENDENT_AMBULATORY_CARE_PROVIDER_SITE_OTHER): Payer: Medicare Other | Admitting: Family Medicine

## 2015-10-28 DIAGNOSIS — G894 Chronic pain syndrome: Secondary | ICD-10-CM

## 2015-10-28 DIAGNOSIS — F341 Dysthymic disorder: Secondary | ICD-10-CM | POA: Diagnosis not present

## 2015-10-28 MED ORDER — HYDROCODONE-ACETAMINOPHEN 7.5-325 MG PO TABS: 1.0000 | ORAL_TABLET | Freq: Two times a day (BID) | ORAL | 0 refills | Status: DC | PRN

## 2015-10-28 NOTE — Assessment & Plan Note (Addendum)
Patient complaining of chronic back pain. Pain helped with regular prescription of Norco. No evidence of point tenderness. No new changes in this pain. No radicular symptoms reported. - Refilled Norco prescription. Would strongly consider discontinuing long-term narcotic use due to the fact that patient is currently on benzodiazepines and is over the age of 81. - Consider transition to tricyclic/SNRI (amitriptyline/nortriptyline/Cymbalta) for chronic pain management. >> Leaning more towards SNRI due to age. - Considered the possible benefit of prednisone taper due to the inability to use NSAIDs secondary to her any disease. Did not prescribe these today due to the fact that she has a type II diabetic and her daughter reports a recent change in her metformin dosing from her nephrologist. Patient would likely benefit from this or a simple IM steroid injection in the future.

## 2015-10-28 NOTE — Assessment & Plan Note (Addendum)
Patient is asking for refills on her Xanax. Her into my records patient should still have refills available. I contacted her pharmacy while she was present and she still has 2 refills available. Informed her and her daughter of this and both were very grateful. - No changes at this time. - Would consider initiation of SNRI (to cover pain and depression)  Of note: Patient exhibiting emotional signs of substance dependence. I discussed the possibility of having a conversation with her PCP about transitioning to a SNRI. She was VERY displeased with the possibility of no longer having Xanax as a regular part of her management, and it took some time for her to change topics even after reassurance that no changes were being made today.

## 2015-10-28 NOTE — Progress Notes (Signed)
   HPI  CC: Medication refills. Patient is here for medication refills. She states that she is been having significant difficulty since running out of her medications. She typically takes hydrocodone for chronic back pain and arthritis. She also takes Xanax for anxiety and depression. She endorses no new issues at this time, only uncontrolled chronic issues. She denies any recent dizziness, confusion, lightheadedness, falls, weakness, numbness, paresthesias.  Daughter is joining her today in clinic. She has no other complaints at this time  Review of Systems   See HPI for ROS. All other systems reviewed and are negative.  CC, SH/smoking status, and VS noted  Objective: BP (!) 176/88 (BP Location: Right Arm, Patient Position: Sitting, Cuff Size: Normal)   Pulse (!) 117   Temp 98.1 F (36.7 C) (Oral)   Wt 134 lb (60.8 kg)   BMI 23.00 kg/m  Gen: NAD, Pleasant and interactive CV:  tachycardic otherwise RR Resp: Non-labored, CTAB  Ext: WWP, no edema Neuro: Alert and oriented, speech normal  Assessment and plan:  Chronic pain syndrome Patient complaining of chronic back pain. Pain helped with regular prescription of Norco. No evidence of point tenderness. No new changes in this pain. No radicular symptoms reported. - Refilled Norco prescription. Would strongly consider discontinuing long-term narcotic use due to the fact that patient is currently on benzodiazepines and is over the age of 30. - Consider transition to tricyclic/SNRI (amitriptyline/nortriptyline/Cymbalta) for chronic pain management. >> Leaning more towards SNRI due to age. - Considered the possible benefit of prednisone taper due to the inability to use NSAIDs secondary to her any disease. Did not prescribe these today due to the fact that she has a type II diabetic and her daughter reports a recent change in her metformin dosing from her nephrologist. Patient would likely benefit from this or a simple IM steroid injection  in the future.  ANXIETY DEPRESSION Patient is asking for refills on her Xanax. Her into my records patient should still have refills available. I contacted her pharmacy while she was present and she still has 2 refills available. Informed her and her daughter of this and both were very grateful. - No changes at this time. - Would consider initiation of SNRI (to cover pain and depression)  Of note: Patient exhibiting emotional signs of substance dependence. I discussed the possibility of having a conversation with her PCP about transitioning to a SNRI. She was VERY displeased with the possibility of no longer having Xanax as a regular part of her management, and it took some time for her to change topics even after reassurance that no changes were being made today.    Meds ordered this encounter  Medications  . HYDROcodone-acetaminophen (NORCO) 7.5-325 MG tablet    Sig: Take 1 tablet by mouth 2 (two) times daily as needed.    Dispense:  60 tablet    Refill:  0    Do not fill before 30 days from last refill     Elberta Leatherwood, MD,MS,  PGY3 10/28/2015 6:36 PM

## 2015-10-28 NOTE — Patient Instructions (Signed)
It was a pleasure seeing you today in our clinic. Today we discussed your pain and anxiety. Here is the treatment plan we have discussed and agreed upon together:   - You still have 2 refills of her Xanax left to be picked up. - Today I provided you a refill on her hydrocodone. - Make sure to come back in 1-2 months for refills at that time.

## 2015-11-16 ENCOUNTER — Other Ambulatory Visit: Payer: Self-pay | Admitting: *Deleted

## 2015-11-16 DIAGNOSIS — K219 Gastro-esophageal reflux disease without esophagitis: Secondary | ICD-10-CM

## 2015-11-17 MED ORDER — OMEPRAZOLE 20 MG PO CPDR: DELAYED_RELEASE_CAPSULE | ORAL | 1 refills | Status: DC

## 2015-11-18 ENCOUNTER — Ambulatory Visit (INDEPENDENT_AMBULATORY_CARE_PROVIDER_SITE_OTHER): Payer: Medicare Other | Admitting: Family Medicine

## 2015-11-18 VITALS — BP 148/88 | HR 113 | Temp 98.0°F | Wt 136.0 lb

## 2015-11-18 DIAGNOSIS — G894 Chronic pain syndrome: Secondary | ICD-10-CM

## 2015-11-18 DIAGNOSIS — F341 Dysthymic disorder: Secondary | ICD-10-CM | POA: Diagnosis not present

## 2015-11-18 DIAGNOSIS — G8929 Other chronic pain: Secondary | ICD-10-CM

## 2015-11-18 DIAGNOSIS — Z7189 Other specified counseling: Secondary | ICD-10-CM | POA: Diagnosis not present

## 2015-11-18 MED ORDER — HYDROCODONE-ACETAMINOPHEN 7.5-325 MG PO TABS: 1.0000 | ORAL_TABLET | Freq: Two times a day (BID) | ORAL | 0 refills | Status: DC | PRN

## 2015-11-18 MED ORDER — DULOXETINE HCL 30 MG PO CPEP: 30.0000 mg | ORAL_CAPSULE | Freq: Every day | ORAL | 0 refills | Status: DC

## 2015-11-18 MED ORDER — ALPRAZOLAM 1 MG PO TABS: 1.0000 mg | ORAL_TABLET | Freq: Two times a day (BID) | ORAL | 0 refills | Status: DC | PRN

## 2015-11-18 NOTE — Assessment & Plan Note (Signed)
Refilled one month of Norco per pain contract Lengthy discussion about dangers and ineffectiveness of long-term management with benzos and opioids. Start cymbalta 30mg  daily  F/u in 1 month and increase cymbalta to 60mg  daily is tolerating lower dose Discussed long-term weaning of narcotics after cymbalta is on-board

## 2015-11-18 NOTE — Progress Notes (Signed)
   Subjective:   [name] is a 82 y.o. female with a history of HTN, T2 DM, chronic pain syndrome, anxiety here for diabetes follow-up  Depression/anxiety - worried about losing medication after discussion last month - taking xanax BID for ~60 years - feels like anxiety is well controlled - Very resistant to talking to psychology or taking any medications - Patient denies SI/HI - reports taking other medications in the past but doesn't remember what  Chronic back pain - taking Norco BID prn - pretty consistently - no radicular symptoms - knee pain is getting worse - no bowel/bladder incontinence, no fevers  Review of Systems:  Per HPI.   Social History: Never smoker. Living with her daughter  Objective:  BP (!) 148/88   Pulse (!) 113   Temp 98 F (36.7 C) (Oral)   Wt 136 lb (61.7 kg)   BMI 23.34 kg/m   Gen:  82 y.o. female in NAD HEENT: NCAT, MMM, EOMI, PERRL, anicteric sclerae CV: RRR, no MRG Resp: Non-labored, CTAB, no wheezes noted Ext: WWP, no edema MSK: No obvious deformities, gait intact, knees diffusely TTP, no swelling or ligamental laxity, Back non TTP, neg SLR b/l Neuro: Alert and oriented, speech normal     Assessment & Plan:     [name] is a 82 y.o. female here for   Chronic pain syndrome Refilled one month of Norco per pain contract Lengthy discussion about dangers and ineffectiveness of long-term management with benzos and opioids. Start cymbalta 30mg  daily  F/u in 1 month and increase cymbalta to 60mg  daily is tolerating lower dose Discussed long-term weaning of narcotics after cymbalta is on-board  ANXIETY DEPRESSION Start cymbalta as above and increase at next visit  One month supply of Xanax given  Patient gets very agitated with discussion of changing medications.  She seems to be exhibiting signs of benzo and opioid dependence.  She becomes tearful during discussions and perseverates on matter.  Lengthy discussion about  dangers and ineffectiveness of long-term management with benzos and opioids. Also discussed plan to taper benzos after cymbalta on board    Virginia Crews, MD MPH PGY-3,  [facility] 11/18/2015  11:04 AM

## 2015-11-18 NOTE — Assessment & Plan Note (Signed)
Start cymbalta as above and increase at next visit  One month supply of Xanax given  Patient gets very agitated with discussion of changing medications.  She seems to be exhibiting signs of benzo and opioid dependence.  She becomes tearful during discussions and perseverates on matter.  Lengthy discussion about dangers and ineffectiveness of long-term management with benzos and opioids. Also discussed plan to taper benzos after cymbalta on board

## 2015-11-18 NOTE — Patient Instructions (Signed)
Nice to see you today. I refilled your medications. We discussed the dangers of taking these medications together and that they're ineffective and the long-term for treatment of pain and anxiety. We have started Cymbalta, once daily medication that should help your anxiety and your chronic pain. I'll see back in one month and we will try to increase the dose of Cymbalta at that time.  Take care, Dr. Jacinto Reap

## 2015-12-10 ENCOUNTER — Encounter (HOSPITAL_COMMUNITY): Payer: Self-pay | Admitting: *Deleted

## 2015-12-10 ENCOUNTER — Emergency Department (HOSPITAL_COMMUNITY)
Admission: EM | Admit: 2015-12-10 | Discharge: 2015-12-10 | Disposition: A | Discharge status: Home / Self Care | Payer: Medicare Other | Attending: Dermatology | Admitting: Dermatology

## 2015-12-10 ENCOUNTER — Telehealth: Payer: Self-pay | Admitting: Cardiovascular Disease

## 2015-12-10 DIAGNOSIS — R002 Palpitations: Secondary | ICD-10-CM | POA: Diagnosis not present

## 2015-12-10 DIAGNOSIS — Z5321 Procedure and treatment not carried out due to patient leaving prior to being seen by health care provider: Secondary | ICD-10-CM | POA: Diagnosis not present

## 2015-12-10 DIAGNOSIS — R531 Weakness: Secondary | ICD-10-CM | POA: Insufficient documentation

## 2015-12-10 LAB — BASIC METABOLIC PANEL
Anion gap: 7 (ref 5–15)
BUN: 18 mg/dL (ref 6–20)
CHLORIDE: 108 mmol/L (ref 101–111)
CO2: 23 mmol/L (ref 22–32)
CREATININE: 1.27 mg/dL — AB (ref 0.44–1.00)
Calcium: 9.2 mg/dL (ref 8.9–10.3)
GFR, EST AFRICAN AMERICAN: 44 mL/min — AB (ref >60)
GFR, EST NON AFRICAN AMERICAN: 38 mL/min — AB (ref >60)
Glucose, Bld: 105 mg/dL — ABNORMAL HIGH (ref 65–99)
Potassium: 4.2 mmol/L (ref 3.5–5.1)
SODIUM: 138 mmol/L (ref 135–145)

## 2015-12-10 LAB — URINALYSIS, ROUTINE W REFLEX MICROSCOPIC
Bilirubin Urine: NEGATIVE
Glucose, UA: NEGATIVE mg/dL
Hgb urine dipstick: NEGATIVE
KETONES UR: NEGATIVE mg/dL
NITRITE: NEGATIVE
PROTEIN: 30 mg/dL — AB
Specific Gravity, Urine: 1.013 (ref 1.005–1.030)
pH: 7 (ref 5.0–8.0)

## 2015-12-10 LAB — CBC
HCT: 30.3 % — ABNORMAL LOW (ref 36.0–46.0)
Hemoglobin: 8.7 g/dL — ABNORMAL LOW (ref 12.0–15.0)
MCH: 21.1 pg — ABNORMAL LOW (ref 26.0–34.0)
MCHC: 28.7 g/dL — ABNORMAL LOW (ref 30.0–36.0)
MCV: 73.4 fL — ABNORMAL LOW (ref 78.0–100.0)
PLATELETS: 207 10*3/uL (ref 150–400)
RBC: 4.13 MIL/uL (ref 3.87–5.11)
RDW: 20 % — AB (ref 11.5–15.5)
WBC: 10 10*3/uL (ref 4.0–10.5)

## 2015-12-10 LAB — URINE MICROSCOPIC-ADD ON

## 2015-12-10 NOTE — Telephone Encounter (Signed)
Received call from daughter Clarene Critchley, [place] reporting patient has been feeling very fatigued, weak, BP running up and HR variable between 113-135. Pt has A Fib history, on rate/rhythm correcting medications. I advised w symptoms to have patient seen in ED for best evaluation. May need IV correction for rate control. Caller voiced understanding and thanks.  Notified Trish cardmaster via VM

## 2015-12-10 NOTE — ED Triage Notes (Signed)
Pt's daughter reports pt lives with her, states pt has been c/o weakness for 2 weeks, which became severe x 2 days ago when she started to have palpitations.  States pt has hx of SVT and was "shocked" about a year ago.  Pt is A&Ox 4, denies any pain at this time.

## 2015-12-10 NOTE — Telephone Encounter (Signed)
Pt's heart rate keep is running between 113 to 125.

## 2015-12-10 NOTE — ED Notes (Addendum)
Encouraged pt. To stay, Pt. s family upset and wanted pt.s results.  Explained to the pt. We were unable to give results out.  She stated, "This is  A ridiculous wait,."  Apoligized to the pt. And her family numerous times. And explained that we cared about them and encouraged them to stay. .Pt.'s daughter upset and stated, "We are leaving."

## 2015-12-13 ENCOUNTER — Encounter (HOSPITAL_COMMUNITY): Payer: Self-pay

## 2015-12-13 ENCOUNTER — Emergency Department (HOSPITAL_COMMUNITY): Payer: Medicare Other

## 2015-12-13 ENCOUNTER — Telehealth: Payer: Self-pay | Admitting: Cardiovascular Disease

## 2015-12-13 ENCOUNTER — Inpatient Hospital Stay (HOSPITAL_COMMUNITY)
Admission: EM | Admit: 2015-12-13 | Discharge: 2015-12-17 | DRG: 308 | Disposition: A | Discharge status: Home Health | Payer: Medicare Other | Attending: Family Medicine | Admitting: Family Medicine

## 2015-12-13 DIAGNOSIS — I10 Essential (primary) hypertension: Secondary | ICD-10-CM | POA: Diagnosis present

## 2015-12-13 DIAGNOSIS — E1122 Type 2 diabetes mellitus with diabetic chronic kidney disease: Secondary | ICD-10-CM | POA: Diagnosis present

## 2015-12-13 DIAGNOSIS — I495 Sick sinus syndrome: Secondary | ICD-10-CM | POA: Diagnosis not present

## 2015-12-13 DIAGNOSIS — K219 Gastro-esophageal reflux disease without esophagitis: Secondary | ICD-10-CM | POA: Diagnosis present

## 2015-12-13 DIAGNOSIS — M170 Bilateral primary osteoarthritis of knee: Secondary | ICD-10-CM | POA: Diagnosis not present

## 2015-12-13 DIAGNOSIS — E7800 Pure hypercholesterolemia, unspecified: Secondary | ICD-10-CM | POA: Diagnosis not present

## 2015-12-13 DIAGNOSIS — D509 Iron deficiency anemia, unspecified: Secondary | ICD-10-CM | POA: Diagnosis not present

## 2015-12-13 DIAGNOSIS — E785 Hyperlipidemia, unspecified: Secondary | ICD-10-CM

## 2015-12-13 DIAGNOSIS — J90 Pleural effusion, not elsewhere classified: Secondary | ICD-10-CM | POA: Diagnosis not present

## 2015-12-13 DIAGNOSIS — Z79899 Other long term (current) drug therapy: Secondary | ICD-10-CM

## 2015-12-13 DIAGNOSIS — Z66 Do not resuscitate: Secondary | ICD-10-CM | POA: Diagnosis present

## 2015-12-13 DIAGNOSIS — I480 Paroxysmal atrial fibrillation: Principal | ICD-10-CM | POA: Diagnosis present

## 2015-12-13 DIAGNOSIS — M8580 Other specified disorders of bone density and structure, unspecified site: Secondary | ICD-10-CM | POA: Diagnosis present

## 2015-12-13 DIAGNOSIS — Z841 Family history of disorders of kidney and ureter: Secondary | ICD-10-CM

## 2015-12-13 DIAGNOSIS — R002 Palpitations: Secondary | ICD-10-CM | POA: Diagnosis present

## 2015-12-13 DIAGNOSIS — G2581 Restless legs syndrome: Secondary | ICD-10-CM | POA: Diagnosis present

## 2015-12-13 DIAGNOSIS — I2510 Atherosclerotic heart disease of native coronary artery without angina pectoris: Secondary | ICD-10-CM | POA: Diagnosis not present

## 2015-12-13 DIAGNOSIS — N183 Chronic kidney disease, stage 3 unspecified: Secondary | ICD-10-CM | POA: Diagnosis present

## 2015-12-13 DIAGNOSIS — Z888 Allergy status to other drugs, medicaments and biological substances status: Secondary | ICD-10-CM

## 2015-12-13 DIAGNOSIS — J9811 Atelectasis: Secondary | ICD-10-CM | POA: Diagnosis present

## 2015-12-13 DIAGNOSIS — E113299 Type 2 diabetes mellitus with mild nonproliferative diabetic retinopathy without macular edema, unspecified eye: Secondary | ICD-10-CM | POA: Diagnosis not present

## 2015-12-13 DIAGNOSIS — Z950 Presence of cardiac pacemaker: Secondary | ICD-10-CM | POA: Diagnosis not present

## 2015-12-13 DIAGNOSIS — I4892 Unspecified atrial flutter: Secondary | ICD-10-CM | POA: Diagnosis present

## 2015-12-13 DIAGNOSIS — I4891 Unspecified atrial fibrillation: Secondary | ICD-10-CM | POA: Diagnosis not present

## 2015-12-13 DIAGNOSIS — R079 Chest pain, unspecified: Secondary | ICD-10-CM | POA: Diagnosis not present

## 2015-12-13 DIAGNOSIS — F17200 Nicotine dependence, unspecified, uncomplicated: Secondary | ICD-10-CM | POA: Diagnosis present

## 2015-12-13 DIAGNOSIS — E1169 Type 2 diabetes mellitus with other specified complication: Secondary | ICD-10-CM

## 2015-12-13 DIAGNOSIS — I5033 Acute on chronic diastolic (congestive) heart failure: Secondary | ICD-10-CM | POA: Diagnosis not present

## 2015-12-13 DIAGNOSIS — F341 Dysthymic disorder: Secondary | ICD-10-CM

## 2015-12-13 DIAGNOSIS — I509 Heart failure, unspecified: Secondary | ICD-10-CM | POA: Diagnosis not present

## 2015-12-13 DIAGNOSIS — R5381 Other malaise: Secondary | ICD-10-CM

## 2015-12-13 DIAGNOSIS — H9113 Presbycusis, bilateral: Secondary | ICD-10-CM | POA: Diagnosis present

## 2015-12-13 DIAGNOSIS — Z23 Encounter for immunization: Secondary | ICD-10-CM

## 2015-12-13 DIAGNOSIS — J9601 Acute respiratory failure with hypoxia: Secondary | ICD-10-CM | POA: Diagnosis not present

## 2015-12-13 DIAGNOSIS — R000 Tachycardia, unspecified: Secondary | ICD-10-CM | POA: Diagnosis not present

## 2015-12-13 DIAGNOSIS — Z952 Presence of prosthetic heart valve: Secondary | ICD-10-CM | POA: Diagnosis not present

## 2015-12-13 DIAGNOSIS — G894 Chronic pain syndrome: Secondary | ICD-10-CM | POA: Diagnosis not present

## 2015-12-13 DIAGNOSIS — Z720 Tobacco use: Secondary | ICD-10-CM

## 2015-12-13 DIAGNOSIS — F329 Major depressive disorder, single episode, unspecified: Secondary | ICD-10-CM | POA: Diagnosis present

## 2015-12-13 DIAGNOSIS — R0600 Dyspnea, unspecified: Secondary | ICD-10-CM | POA: Diagnosis not present

## 2015-12-13 DIAGNOSIS — I483 Typical atrial flutter: Secondary | ICD-10-CM | POA: Diagnosis not present

## 2015-12-13 DIAGNOSIS — J9600 Acute respiratory failure, unspecified whether with hypoxia or hypercapnia: Secondary | ICD-10-CM | POA: Diagnosis not present

## 2015-12-13 DIAGNOSIS — F419 Anxiety disorder, unspecified: Secondary | ICD-10-CM | POA: Diagnosis present

## 2015-12-13 DIAGNOSIS — Z8249 Family history of ischemic heart disease and other diseases of the circulatory system: Secondary | ICD-10-CM

## 2015-12-13 DIAGNOSIS — I110 Hypertensive heart disease with heart failure: Secondary | ICD-10-CM | POA: Diagnosis not present

## 2015-12-13 DIAGNOSIS — I130 Hypertensive heart and chronic kidney disease with heart failure and stage 1 through stage 4 chronic kidney disease, or unspecified chronic kidney disease: Secondary | ICD-10-CM | POA: Diagnosis present

## 2015-12-13 DIAGNOSIS — E119 Type 2 diabetes mellitus without complications: Secondary | ICD-10-CM

## 2015-12-13 DIAGNOSIS — Z7984 Long term (current) use of oral hypoglycemic drugs: Secondary | ICD-10-CM

## 2015-12-13 DIAGNOSIS — Z7901 Long term (current) use of anticoagulants: Secondary | ICD-10-CM

## 2015-12-13 DIAGNOSIS — R0902 Hypoxemia: Secondary | ICD-10-CM

## 2015-12-13 DIAGNOSIS — R0602 Shortness of breath: Secondary | ICD-10-CM

## 2015-12-13 DIAGNOSIS — I5031 Acute diastolic (congestive) heart failure: Secondary | ICD-10-CM | POA: Diagnosis not present

## 2015-12-13 DIAGNOSIS — Z7951 Long term (current) use of inhaled steroids: Secondary | ICD-10-CM

## 2015-12-13 LAB — BASIC METABOLIC PANEL
ANION GAP: 9 (ref 5–15)
BUN: 18 mg/dL (ref 6–20)
CALCIUM: 9.1 mg/dL (ref 8.9–10.3)
CHLORIDE: 106 mmol/L (ref 101–111)
CO2: 22 mmol/L (ref 22–32)
CREATININE: 1.24 mg/dL — AB (ref 0.44–1.00)
GFR calc non Af Amer: 39 mL/min — ABNORMAL LOW (ref >60)
GFR, EST AFRICAN AMERICAN: 45 mL/min — AB (ref >60)
Glucose, Bld: 171 mg/dL — ABNORMAL HIGH (ref 65–99)
Potassium: 3.9 mmol/L (ref 3.5–5.1)
SODIUM: 137 mmol/L (ref 135–145)

## 2015-12-13 LAB — CBC
HEMATOCRIT: 30.9 % — AB (ref 36.0–46.0)
HEMOGLOBIN: 9.1 g/dL — AB (ref 12.0–15.0)
MCH: 21.5 pg — ABNORMAL LOW (ref 26.0–34.0)
MCHC: 29.4 g/dL — AB (ref 30.0–36.0)
MCV: 73 fL — ABNORMAL LOW (ref 78.0–100.0)
Platelets: 259 10*3/uL (ref 150–400)
RBC: 4.23 MIL/uL (ref 3.87–5.11)
RDW: 19.8 % — ABNORMAL HIGH (ref 11.5–15.5)
WBC: 11.1 10*3/uL — AB (ref 4.0–10.5)

## 2015-12-13 LAB — BRAIN NATRIURETIC PEPTIDE: B NATRIURETIC PEPTIDE 5: 936.4 pg/mL — AB (ref 0.0–100.0)

## 2015-12-13 LAB — I-STAT TROPONIN, ED: TROPONIN I, POC: 0.02 ng/mL (ref 0.00–0.08)

## 2015-12-13 LAB — D-DIMER, QUANTITATIVE: D-Dimer, Quant: 1.37 ug/mL-FEU — ABNORMAL HIGH (ref 0.00–0.50)

## 2015-12-13 MED ORDER — IOPAMIDOL (ISOVUE-370) INJECTION 76%
INTRAVENOUS | Status: AC
  Filled 2015-12-13: qty 100

## 2015-12-13 MED ORDER — FUROSEMIDE 10 MG/ML IJ SOLN
40.0000 mg | Freq: Once | INTRAMUSCULAR | Status: AC
  Administered 2015-12-14: 40 mg via INTRAVENOUS
  Filled 2015-12-13: qty 4

## 2015-12-13 MED ORDER — ACETAMINOPHEN 325 MG PO TABS
650.0000 mg | ORAL_TABLET | Freq: Once | ORAL | Status: AC
  Administered 2015-12-13: 650 mg via ORAL
  Filled 2015-12-13: qty 2

## 2015-12-13 MED ORDER — ONDANSETRON 4 MG PO TBDP
4.0000 mg | ORAL_TABLET | Freq: Once | ORAL | Status: AC
  Administered 2015-12-14: 4 mg via ORAL
  Filled 2015-12-13: qty 1

## 2015-12-13 NOTE — Telephone Encounter (Signed)
Left msg for patient to call. 

## 2015-12-13 NOTE — ED Provider Notes (Signed)
82 yo with c/o palpitations x 4 days, has pacer. Cardiology did remote interrogation with some a flutter, history of same, on Eliquis Today weak and tired with continued palpitations 84% today on arrival, on ventimask Elevated d-dimer - pending CT chest  CHF vs PE BNP 900 Lasix, 40 IV given PCP - [facility]  Plan - admit to [facility]   CTA shows no PE but is showing bilateral pleural effusions. Discussed with [place] resident, Dr. Juanito Doom, who accepts the patient for admission  Evaluation of the patient: she is resting comfortably. O2 sat 95% on 3L. She is awake, alert, in NAD and without any needs. Discussed admission and patient agrees.    Charlann Lange, PA-C 12/14/15 0128    Alfonzo Beers, MD 12/24/15 [phone]

## 2015-12-13 NOTE — Telephone Encounter (Signed)
Reviewed manual transmission.  AF burden increased since the end of August, V rates during AMS in 110-120bpm range approximately 65% of the time.  1 HVR--AF w/RVR from 11/14/15.  Presenting rhythm A-flutter/Vs 115bpm.  Will print copy of transmission for review at APP appointment.

## 2015-12-13 NOTE — Telephone Encounter (Signed)
Noted some flutter-waves falling into blanking upon further review of transmission.  Patient's daughter is agreeable to sooner appointment with Rosaria Ferries, PA on 12/14/15 at 10:30am.  Advised that the PPM is not causing patient's HR to run higher than normal, but that slight reprogramming will allow for better detection of A-flutter in the future.  [facility] rep aware of patient's appointment date and time and will be present for device check.  Patient's daughter verbalizes understanding and appreciation.  She denies additional questions or concerns at this time.

## 2015-12-13 NOTE — Telephone Encounter (Signed)
LMOVM for pt daughter to return call.  

## 2015-12-13 NOTE — ED Provider Notes (Signed)
[facility] DEPT Provider Note   CSN: [id number] Arrival date & time: 12/13/15  2023     History   Chief Complaint Chief Complaint  Patient presents with  . Palpitations    HPI BETHANNIE IGLEHART is a 82 y.o. female.  HPI  Pt with hx of CAD, CHF, DM, HTN presenting with c/o palpitations/fast heart rate and shortness of breath.  She came to the ED 4 days ago and did not wait to be seen due to long wait in waiting room.  She has continued to have rapid heart rate, but today developed generalized weakness and shortness of breath. No fever, dry cough.  No leg swelling.  No chest pain.  She had her pacemaker interrogated remotely today by cardiology- saw some aflutter- has appointment to be seen tomorrow by cardiology.  However, as she started to feel weak and short of breath, EMS was called and came to the ED for evaluation.  There are no other associated systemic symptoms, there are no other alleviating or modifying factors.   Past Medical History:  Diagnosis Date  . Anemia   . Anxiety   . CAD (coronary artery disease)   . CHF (congestive heart failure) (HCC)    EF 55% on 01/05/11, trace AR w/ Bioprosthetic valve  . DM2 (diabetes mellitus, type 2) ([place])   . GERD (gastroesophageal reflux disease)   . HLD (hyperlipidemia)   . HTN (hypertension)   . PAF (paroxysmal atrial fibrillation) ([place])   . Restless legs syndrome (RLS)   . Tobacco abuse     Patient Active Problem List   Diagnosis Date Noted  . Hearing loss 09/02/2015  . Restless leg syndrome 09/02/2015  . CKD stage 3 due to type 2 diabetes mellitus ([place]) 06/15/2015  . SSS (sick sinus syndrome) ([place]) 03/22/2015  . Presbycusis of both ears 03/10/2015  . Malignant neoplasm skin of face 03/10/2015  . Encounter for chronic pain management 06/02/2014  . Recurrent falls 06/02/2014  . S/P AVR (aortic valve replacement) 05/17/2014  . Atrial flutter ([place]) 05/01/2014  . Constipation 02/10/2014  . Allergic rhinitis 08/06/2013    . Chronic pain syndrome 05/22/2013  . Insomnia 03/13/2013  . Dry skin dermatitis 03/13/2013  . Pacemaker 01/08/2013  . Mild nonproliferative diabetic retinopathy(362.04) 12/05/2012  . TOBACCO USER 03/01/2009  . OSTEOPENIA 08/10/2008  . B12 DEFICIENCY 08/07/2008  . GERD 08/07/2008  . Essential hypertension, benign 03/26/2008  . UNSPECIFIED PROBLEMS W/LIMBS AND OTHER PROBLEMS 02/26/2008  . [facility] COMPLICATION DUE HEART VALVE PROSTHESIS 11/22/2007  . Hyperlipidemia associated with type 2 diabetes mellitus ([place]) 10/23/2007  . ACTINIC KERATOSIS 10/23/2007  . OSTEOARTHRITIS, KNEES, BILATERAL 10/23/2007  . ANXIETY DEPRESSION 08/20/2007  . CAD (coronary artery disease) 04/05/2007  . ANEMIA NEC 09/12/2006  . Diabetes mellitus type 2, controlled ([place]) 05/08/2006  . Congestive heart failure ([place]) 05/08/2006    Past Surgical History:  Procedure Laterality Date  . AORTIC VALVE REPLACEMENT  08/25/2002   tissue valve  . CARDIOVERSION N/A 05/05/2014   Procedure: CARDIOVERSION;  Surgeon: Dorothy Spark, MD;  Location: [facility];  Service: Cardiovascular;  Laterality: N/A;  . PACEMAKER INSERTION  04/02/2012   [facility]  . PERMANENT PACEMAKER GENERATOR CHANGE N/A 04/02/2012   Procedure: PERMANENT PACEMAKER GENERATOR CHANGE;  Surgeon: Sanda Klein, MD;  Location: [facility] CATH LAB;  Service: Cardiovascular;  Laterality: N/A;    OB History    No data available       Home Medications    Prior to Admission medications  Medication Sig Start Date End Date Taking? Authorizing Provider  ALPRAZolam Prudy Feeler) 1 MG tablet Take 1 tablet (1 mg total) by mouth 2 (two) times daily as needed for anxiety. 11/18/15   Erasmo Downer, MD  atenolol (TENORMIN) 100 MG tablet TAKE 1 TABLET (100 MG TOTAL) BY MOUTH DAILY. 09/27/15   Mihai Croitoru, MD  benazepril (LOTENSIN) 20 MG tablet TAKE 1 TABLET (20 MG TOTAL) BY MOUTH 2 (TWO) TIMES DAILY. 09/27/15   Mihai Croitoru, MD  Blood Glucose Monitoring Suppl (ONE TOUCH  ULTRA 2) W/DEVICE KIT  12/03/13   Historical Provider, MD  CRESTOR 10 MG tablet TAKE 1 TABLET (10 MG TOTAL) BY MOUTH DAILY. 08/13/14   Mihai Croitoru, MD  diltiazem (CARDIZEM CD) 120 MG 24 hr capsule TAKE 1 CAPSULE (120 MG TOTAL) BY MOUTH DAILY. 10/14/15   Mihai Croitoru, MD  DULoxetine (CYMBALTA) 30 MG capsule Take 1 capsule (30 mg total) by mouth daily. 11/18/15   Erasmo Downer, MD  ELIQUIS 2.5 MG TABS tablet TAKE 1 TABLET (2.5 MG TOTAL) BY MOUTH 2 (TWO) TIMES DAILY. 04/02/15   Mihai Croitoru, MD  ferrous sulfate 325 (65 FE) MG tablet Take 1 tablet (325 mg total) by mouth 2 (two) times daily with a meal. 09/03/15   Erasmo Downer, MD  fluticasone (FLONASE) 50 MCG/ACT nasal spray PLACE 2 SPRAYS INTO BOTH NOSTRILS DAILY. 09/27/15   Erasmo Downer, MD  HYDROcodone-acetaminophen (NORCO) 7.5-325 MG tablet Take 1 tablet by mouth 2 (two) times daily as needed. 11/18/15   Erasmo Downer, MD  metFORMIN (GLUCOPHAGE) 500 MG tablet Take 1 tablet (500 mg total) by mouth 2 (two) times daily with a meal. 09/02/15   Erasmo Downer, MD  omeprazole (PRILOSEC) 20 MG capsule TAKE 1 CAPSULE (20 MG TOTAL) BY MOUTH DAILY. 11/17/15   Erasmo Downer, MD  ONE TOUCH ULTRA TEST test strip TEST BLOOD SUGAR TWICE A DAY 12/10/14   Narda Bonds, MD  ZETIA 10 MG tablet TAKE 1 TABLET (10 MG TOTAL) BY MOUTH DAILY. 07/19/15   Thurmon Fair, MD    Family History Family History  Problem Relation Age of Onset  . Heart attack Brother   . Kidney disease Sister     Social History Social History  Substance Use Topics  . Smoking status: Never Smoker  . Smokeless tobacco: Current User    Types: Snuff  . Alcohol use No     Allergies   Pravachol [pravastatin sodium]   Review of Systems Review of Systems  ROS reviewed and all otherwise negative except for mentioned in HPI   Physical Exam Updated Vital Signs BP (!) 171/106   Pulse 115   Temp 97.7 F (36.5 C) (Oral)   Resp (!) 36   SpO2 92%   Vitals reviewed Physical Exam Physical Examination: General appearance - alert, chronically ill appearing, and in no distress Mental status - alert, oriented to person, place, and time Eyes - no conjunctival injection, no scleral icterus Mouth - mucous membranes moist, pharynx normal without lesions Neck - supple, no significant adenopathy Chest - clear to auscultation, no wheezes, rales or rhonchi, symmetric air entry, some increased respiratory effort Heart - tachycardia, regular rhythm, normal S1, S2, no murmurs, rubs, clicks or gallops Abdomen - soft, nontender, nondistended, no masses or organomegaly Neurological - alert, oriented, normal speech, no focal findings or movement disorder noted Extremities - peripheral pulses normal, no pedal edema, no clubbing or cyanosis Skin - normal coloration and turgor, no  rashes  ED Treatments / Results  Labs (all labs ordered are listed, but only abnormal results are displayed) Labs Reviewed  CBC - Abnormal; Notable for the following:       Result Value   WBC 11.1 (*)    Hemoglobin 9.1 (*)    HCT 30.9 (*)    MCV 73.0 (*)    MCH 21.5 (*)    MCHC 29.4 (*)    RDW 19.8 (*)    All other components within normal limits  BASIC METABOLIC PANEL - Abnormal; Notable for the following:    Glucose, Bld 171 (*)    Creatinine, Ser 1.24 (*)    GFR calc non Af Amer 39 (*)    GFR calc Af Amer 45 (*)    All other components within normal limits  BRAIN NATRIURETIC PEPTIDE - Abnormal; Notable for the following:    B Natriuretic Peptide 936.4 (*)    All other components within normal limits  D-DIMER, QUANTITATIVE (NOT AT [facility]) - Abnormal; Notable for the following:    D-Dimer, Quant 1.37 (*)    All other components within normal limits  I-STAT TROPOININ, ED    EKG  EKG Interpretation  Date/Time:  Monday December 13 2015 20:31:21 EDT Ventricular Rate:  115 PR Interval:    QRS Duration: 98 QT Interval:  340 QTC Calculation: 471 R  Axis:   -55 Text Interpretation:  Sinus tachycardia Inferior infarct, old sinus versus atrial flutter with 2:1 conduction No significant change since last tracing Confirmed by Canary Brim  MD, Joseh Sjogren [phone]) on 12/13/2015 8:48:37 PM       Radiology Dg Chest Portable 1 View  Result Date: 12/13/2015 CLINICAL DATA:  Subacute onset of palpitations and shortness of breath. Generalized chest pain. Initial encounter. EXAM: PORTABLE CHEST 1 VIEW COMPARISON:  Chest radiograph performed 05/04/2014 FINDINGS: The lungs are well-aerated. Small to moderate right and small left pleural effusions are noted, with bibasilar airspace opacities, possibly reflecting mild pulmonary edema. There is no evidence of pneumothorax. The cardiomediastinal silhouette is enlarged. The patient is status post median sternotomy. A valve replacement is noted. A pacemaker is noted overlying the right chest wall, with leads ending overlying the right atrium and right ventricle. No acute osseous abnormalities are seen. IMPRESSION: Small to moderate right and small left pleural effusions, with bibasilar airspace opacities, possibly reflecting mild pulmonary edema. Cardiomegaly noted. Electronically Signed   By: Garald Balding M.D.   On: 12/13/2015 22:12    Procedures Procedures (including critical care time)  Medications Ordered in ED Medications  ondansetron (ZOFRAN-ODT) disintegrating tablet 4 mg (not administered)  iopamidol (ISOVUE-370) 76 % injection (not administered)  furosemide (LASIX) injection 40 mg (not administered)  acetaminophen (TYLENOL) tablet 650 mg (650 mg Oral Given 12/13/15 2237)     Initial Impression / Assessment and Plan / ED Course  I have reviewed the triage vital signs and the nursing notes.  Pertinent labs & imaging results that were available during my care of the patient were reviewed by me and considered in my medical decision making (see chart for details).  Clinical Course    Pt presenting with  tachycardia, shortness of breath, hypoxic on arrival.  She is feeling improved on ventimask (was not breathing nasally to get [place] O2)- O2 sats were 84% on arrival.  EKG shows sinus tach versus aflutter- which she has a hx of.  She is on oral diltiazem and eliquis.  No chest pain.  BNP is elevated and CXR shows some  pleural effusions- given IV lasix.  Awaiting CT angio of chest and patient will need admission.  PMD is [facility] and cardiology is Dr. Sallyanne Kuster.    Final Clinical Impressions(s) / ED Diagnoses   Final diagnoses:  Palpitations  Acute congestive heart failure, unspecified congestive heart failure type [facility])    New Prescriptions New Prescriptions   No medications on file     Alfonzo Beers, MD 12/14/15 0004

## 2015-12-13 NOTE — Telephone Encounter (Signed)
Spoke w/ pt daughter. Instructed her how to send remote transmission. Transmission received. Informed pt daughter that a device tech will review and will call her back w/ results. Pt daughter verbalized understanding.

## 2015-12-13 NOTE — Telephone Encounter (Signed)
Follow up ° °Pt voiced returning nurses call. °

## 2015-12-13 NOTE — ED Triage Notes (Signed)
Patient comes from home for an elevated heart rate.  Denies any pain at this time just complains heart is racing.  Patient has pacemaker and history of ablation.  A&Ox4 at this time

## 2015-12-13 NOTE — Telephone Encounter (Signed)
Pt went to the ER on Friday as you told her.Pt was never seen.They took vitals,urine and EKG and that was it,sit there in the waiting are about 7 hours.Please call her.

## 2015-12-13 NOTE — Telephone Encounter (Signed)
Spoke to [place], daughter of patient/listed DPR. Notes patient seen for initial workup in ER for fast HR & fatigue, but not taken to a room. She was c/o back problems after a few hours in waiting room and elected to go home after reporting feeling better. Daughter notes patient has had issues w tachycardia in the past, not sure if other symptoms assoc w this. Reports that her Bp typically runs high in AM and corrects, but HR remains high.  Asked if PM interrogation might be beneficial.  Aware I will send an inquiry to device clinic to see if we can obtain remote download and assess an A Fib burden or other ongoing issue.  Pt scheduled for appt this week w APP, daughter voiced understanding of appt details. Also aware that if patient has any urgent symptoms such as new SOB, pronounced fatigue, chest pain, syncope/near syncope, to call 911.

## 2015-12-13 NOTE — ED Notes (Signed)
Pt is on venti-mask per MD. RT set O2 therapy at 50% FiO2. Pt is tolerating it well. sats are stable. Pt is tachypnic at this,  no acute distress or increase "labored" work of breathing.

## 2015-12-14 ENCOUNTER — Ambulatory Visit: Payer: Medicare Other | Admitting: Physician Assistant

## 2015-12-14 DIAGNOSIS — E1122 Type 2 diabetes mellitus with diabetic chronic kidney disease: Secondary | ICD-10-CM | POA: Diagnosis not present

## 2015-12-14 DIAGNOSIS — J9601 Acute respiratory failure with hypoxia: Secondary | ICD-10-CM | POA: Diagnosis not present

## 2015-12-14 DIAGNOSIS — I509 Heart failure, unspecified: Secondary | ICD-10-CM | POA: Diagnosis not present

## 2015-12-14 DIAGNOSIS — J90 Pleural effusion, not elsewhere classified: Secondary | ICD-10-CM

## 2015-12-14 DIAGNOSIS — R0902 Hypoxemia: Secondary | ICD-10-CM | POA: Diagnosis present

## 2015-12-14 DIAGNOSIS — I495 Sick sinus syndrome: Secondary | ICD-10-CM | POA: Diagnosis not present

## 2015-12-14 DIAGNOSIS — E7800 Pure hypercholesterolemia, unspecified: Secondary | ICD-10-CM | POA: Diagnosis present

## 2015-12-14 DIAGNOSIS — G2581 Restless legs syndrome: Secondary | ICD-10-CM | POA: Diagnosis present

## 2015-12-14 DIAGNOSIS — G894 Chronic pain syndrome: Secondary | ICD-10-CM | POA: Diagnosis present

## 2015-12-14 DIAGNOSIS — E113299 Type 2 diabetes mellitus with mild nonproliferative diabetic retinopathy without macular edema, unspecified eye: Secondary | ICD-10-CM | POA: Diagnosis present

## 2015-12-14 DIAGNOSIS — J9600 Acute respiratory failure, unspecified whether with hypoxia or hypercapnia: Secondary | ICD-10-CM | POA: Diagnosis not present

## 2015-12-14 DIAGNOSIS — K219 Gastro-esophageal reflux disease without esophagitis: Secondary | ICD-10-CM | POA: Diagnosis present

## 2015-12-14 DIAGNOSIS — I5031 Acute diastolic (congestive) heart failure: Secondary | ICD-10-CM | POA: Diagnosis not present

## 2015-12-14 DIAGNOSIS — I130 Hypertensive heart and chronic kidney disease with heart failure and stage 1 through stage 4 chronic kidney disease, or unspecified chronic kidney disease: Secondary | ICD-10-CM | POA: Diagnosis present

## 2015-12-14 DIAGNOSIS — Z952 Presence of prosthetic heart valve: Secondary | ICD-10-CM | POA: Diagnosis not present

## 2015-12-14 DIAGNOSIS — M8580 Other specified disorders of bone density and structure, unspecified site: Secondary | ICD-10-CM | POA: Diagnosis present

## 2015-12-14 DIAGNOSIS — F329 Major depressive disorder, single episode, unspecified: Secondary | ICD-10-CM | POA: Diagnosis present

## 2015-12-14 DIAGNOSIS — N183 Chronic kidney disease, stage 3 (moderate): Secondary | ICD-10-CM | POA: Diagnosis present

## 2015-12-14 DIAGNOSIS — I4892 Unspecified atrial flutter: Secondary | ICD-10-CM | POA: Diagnosis present

## 2015-12-14 DIAGNOSIS — R000 Tachycardia, unspecified: Secondary | ICD-10-CM | POA: Diagnosis not present

## 2015-12-14 DIAGNOSIS — Z950 Presence of cardiac pacemaker: Secondary | ICD-10-CM

## 2015-12-14 DIAGNOSIS — D509 Iron deficiency anemia, unspecified: Secondary | ICD-10-CM | POA: Diagnosis present

## 2015-12-14 DIAGNOSIS — I4891 Unspecified atrial fibrillation: Secondary | ICD-10-CM | POA: Diagnosis not present

## 2015-12-14 DIAGNOSIS — H9113 Presbycusis, bilateral: Secondary | ICD-10-CM | POA: Diagnosis present

## 2015-12-14 DIAGNOSIS — I483 Typical atrial flutter: Secondary | ICD-10-CM

## 2015-12-14 DIAGNOSIS — I2510 Atherosclerotic heart disease of native coronary artery without angina pectoris: Secondary | ICD-10-CM | POA: Diagnosis present

## 2015-12-14 DIAGNOSIS — I480 Paroxysmal atrial fibrillation: Secondary | ICD-10-CM | POA: Diagnosis not present

## 2015-12-14 DIAGNOSIS — M170 Bilateral primary osteoarthritis of knee: Secondary | ICD-10-CM | POA: Diagnosis present

## 2015-12-14 DIAGNOSIS — R0600 Dyspnea, unspecified: Secondary | ICD-10-CM | POA: Diagnosis not present

## 2015-12-14 DIAGNOSIS — R002 Palpitations: Secondary | ICD-10-CM | POA: Diagnosis present

## 2015-12-14 DIAGNOSIS — Z66 Do not resuscitate: Secondary | ICD-10-CM | POA: Diagnosis present

## 2015-12-14 DIAGNOSIS — F419 Anxiety disorder, unspecified: Secondary | ICD-10-CM | POA: Diagnosis present

## 2015-12-14 DIAGNOSIS — I10 Essential (primary) hypertension: Secondary | ICD-10-CM | POA: Diagnosis not present

## 2015-12-14 DIAGNOSIS — I5033 Acute on chronic diastolic (congestive) heart failure: Secondary | ICD-10-CM | POA: Diagnosis not present

## 2015-12-14 DIAGNOSIS — E1169 Type 2 diabetes mellitus with other specified complication: Secondary | ICD-10-CM | POA: Diagnosis not present

## 2015-12-14 DIAGNOSIS — J9811 Atelectasis: Secondary | ICD-10-CM | POA: Diagnosis present

## 2015-12-14 DIAGNOSIS — Z23 Encounter for immunization: Secondary | ICD-10-CM | POA: Diagnosis not present

## 2015-12-14 LAB — FERRITIN: Ferritin: 15 ng/mL (ref 11–307)

## 2015-12-14 LAB — GLUCOSE, CAPILLARY
Glucose-Capillary: 122 mg/dL — ABNORMAL HIGH (ref 65–99)
Glucose-Capillary: 91 mg/dL (ref 65–99)
Glucose-Capillary: 117 mg/dL — ABNORMAL HIGH (ref 65–99)
Glucose-Capillary: 136 mg/dL — ABNORMAL HIGH (ref 65–99)

## 2015-12-14 LAB — TSH: TSH: 2.322 u[IU]/mL (ref 0.350–4.500)

## 2015-12-14 LAB — IRON AND TIBC
Iron: 77 ug/dL (ref 28–170)
SATURATION RATIOS: 17 % (ref 10.4–31.8)
TIBC: 463 ug/dL — ABNORMAL HIGH (ref 250–450)
UIBC: 386 ug/dL

## 2015-12-14 LAB — BASIC METABOLIC PANEL
ANION GAP: 9 (ref 5–15)
BUN: 16 mg/dL (ref 6–20)
CALCIUM: 9.2 mg/dL (ref 8.9–10.3)
CO2: 26 mmol/L (ref 22–32)
Chloride: 104 mmol/L (ref 101–111)
Creatinine, Ser: 1.25 mg/dL — ABNORMAL HIGH (ref 0.44–1.00)
GFR calc Af Amer: 45 mL/min — ABNORMAL LOW (ref >60)
GFR, EST NON AFRICAN AMERICAN: 39 mL/min — AB (ref >60)
Glucose, Bld: 108 mg/dL — ABNORMAL HIGH (ref 65–99)
POTASSIUM: 3.6 mmol/L (ref 3.5–5.1)
SODIUM: 139 mmol/L (ref 135–145)

## 2015-12-14 LAB — RETICULOCYTES
RBC.: 4.25 MIL/uL (ref 3.87–5.11)
RETIC COUNT ABSOLUTE: 80.8 10*3/uL (ref 19.0–186.0)
RETIC CT PCT: 1.9 % (ref 0.4–3.1)

## 2015-12-14 LAB — CBC
HEMATOCRIT: 28.2 % — AB (ref 36.0–46.0)
HEMOGLOBIN: 8.4 g/dL — AB (ref 12.0–15.0)
MCH: 21.5 pg — ABNORMAL LOW (ref 26.0–34.0)
MCHC: 29.8 g/dL — ABNORMAL LOW (ref 30.0–36.0)
MCV: 72.1 fL — ABNORMAL LOW (ref 78.0–100.0)
Platelets: 213 10*3/uL (ref 150–400)
RBC: 3.91 MIL/uL (ref 3.87–5.11)
RDW: 19.2 % — AB (ref 11.5–15.5)
WBC: 9.3 10*3/uL (ref 4.0–10.5)

## 2015-12-14 LAB — FOLATE: FOLATE: 19.6 ng/mL (ref >5.9)

## 2015-12-14 LAB — TROPONIN I

## 2015-12-14 LAB — VITAMIN B12: VITAMIN B 12: 174 pg/mL — AB (ref 180–914)

## 2015-12-14 MED ORDER — BENAZEPRIL HCL 20 MG PO TABS: 20.0000 mg | ORAL_TABLET | Freq: Two times a day (BID) | ORAL | Status: DC

## 2015-12-14 MED ORDER — ORAL CARE MOUTH RINSE
15.0000 mL | Freq: Two times a day (BID) | OROMUCOSAL | Status: DC
  Administered 2015-12-14 – 2015-12-17 (×5): 15 mL via OROMUCOSAL

## 2015-12-14 MED ORDER — POLYETHYLENE GLYCOL 3350 17 G PO PACK
17.0000 g | PACK | Freq: Every day | ORAL | Status: DC | PRN
  Administered 2015-12-15: 17 g via ORAL
  Filled 2015-12-14: qty 1

## 2015-12-14 MED ORDER — FLUTICASONE PROPIONATE 50 MCG/ACT NA SUSP
2.0000 | Freq: Every day | NASAL | Status: DC
  Administered 2015-12-14 – 2015-12-16 (×3): 2 via NASAL
  Filled 2015-12-14: qty 16

## 2015-12-14 MED ORDER — ROSUVASTATIN CALCIUM 10 MG PO TABS
20.0000 mg | ORAL_TABLET | Freq: Every day | ORAL | Status: DC
  Administered 2015-12-16 – 2015-12-17 (×2): 20 mg via ORAL
  Filled 2015-12-14 (×4): qty 2

## 2015-12-14 MED ORDER — IOPAMIDOL (ISOVUE-370) INJECTION 76%
75.0000 mL | Freq: Once | INTRAVENOUS | Status: AC | PRN
  Administered 2015-12-14: 75 mL via INTRAVENOUS

## 2015-12-14 MED ORDER — ALPRAZOLAM 0.5 MG PO TABS: 1.0000 mg | ORAL_TABLET | Freq: Two times a day (BID) | ORAL | Status: DC | PRN

## 2015-12-14 MED ORDER — PANTOPRAZOLE SODIUM 40 MG PO TBEC
40.0000 mg | DELAYED_RELEASE_TABLET | Freq: Every day | ORAL | Status: DC
  Administered 2015-12-14 – 2015-12-17 (×4): 40 mg via ORAL
  Filled 2015-12-14 (×5): qty 1

## 2015-12-14 MED ORDER — EZETIMIBE 10 MG PO TABS
10.0000 mg | ORAL_TABLET | Freq: Every day | ORAL | Status: DC
  Administered 2015-12-14 – 2015-12-17 (×3): 10 mg via ORAL
  Filled 2015-12-14 (×4): qty 1

## 2015-12-14 MED ORDER — ONDANSETRON HCL 4 MG/2ML IJ SOLN: 4.0000 mg | Freq: Four times a day (QID) | INTRAMUSCULAR | Status: DC | PRN

## 2015-12-14 MED ORDER — FUROSEMIDE 10 MG/ML IJ SOLN
40.0000 mg | Freq: Two times a day (BID) | INTRAMUSCULAR | Status: DC
  Administered 2015-12-14: 40 mg via INTRAVENOUS
  Filled 2015-12-14: qty 4

## 2015-12-14 MED ORDER — SODIUM CHLORIDE 0.9% FLUSH
3.0000 mL | Freq: Two times a day (BID) | INTRAVENOUS | Status: DC
  Administered 2015-12-14 – 2015-12-16 (×4): 3 mL via INTRAVENOUS

## 2015-12-14 MED ORDER — DILTIAZEM HCL ER COATED BEADS 120 MG PO CP24
120.0000 mg | ORAL_CAPSULE | Freq: Every day | ORAL | Status: DC
Start: 2015-12-14 — End: 2015-12-14
  Administered 2015-12-14: 120 mg via ORAL
  Filled 2015-12-14: qty 1

## 2015-12-14 MED ORDER — APIXABAN 2.5 MG PO TABS
2.5000 mg | ORAL_TABLET | Freq: Two times a day (BID) | ORAL | Status: DC
  Administered 2015-12-14 – 2015-12-17 (×6): 2.5 mg via ORAL
  Filled 2015-12-14 (×6): qty 1

## 2015-12-14 MED ORDER — FERROUS SULFATE 325 (65 FE) MG PO TABS
325.0000 mg | ORAL_TABLET | Freq: Two times a day (BID) | ORAL | Status: DC
  Administered 2015-12-14 – 2015-12-17 (×7): 325 mg via ORAL
  Filled 2015-12-14 (×7): qty 1

## 2015-12-14 MED ORDER — SODIUM CHLORIDE 0.9% FLUSH
3.0000 mL | Freq: Two times a day (BID) | INTRAVENOUS | Status: DC
  Administered 2015-12-14 – 2015-12-16 (×2): 3 mL via INTRAVENOUS

## 2015-12-14 MED ORDER — CETAPHIL MOISTURIZING EX LOTN
TOPICAL_LOTION | Freq: Two times a day (BID) | CUTANEOUS | Status: DC
  Administered 2015-12-14: 22:00:00 via TOPICAL
  Administered 2015-12-14: 1 via TOPICAL
  Administered 2015-12-15 – 2015-12-17 (×5): via TOPICAL
  Filled 2015-12-14: qty 473

## 2015-12-14 MED ORDER — HYDROCODONE-ACETAMINOPHEN 7.5-325 MG PO TABS: 1.0000 | ORAL_TABLET | Freq: Two times a day (BID) | ORAL | Status: DC | PRN

## 2015-12-14 MED ORDER — SODIUM CHLORIDE 0.9 % IV SOLN: 250.0000 mL | INTRAVENOUS | Status: DC | PRN

## 2015-12-14 MED ORDER — ATENOLOL 25 MG PO TABS
100.0000 mg | ORAL_TABLET | Freq: Every day | ORAL | Status: DC
  Administered 2015-12-14: 100 mg via ORAL
  Filled 2015-12-14: qty 4

## 2015-12-14 MED ORDER — ROSUVASTATIN CALCIUM 10 MG PO TABS
10.0000 mg | ORAL_TABLET | Freq: Every day | ORAL | Status: DC
  Administered 2015-12-14: 10 mg via ORAL
  Filled 2015-12-14: qty 1

## 2015-12-14 MED ORDER — METOPROLOL TARTRATE 100 MG PO TABS
100.0000 mg | ORAL_TABLET | Freq: Two times a day (BID) | ORAL | Status: DC
  Administered 2015-12-14 – 2015-12-17 (×7): 100 mg via ORAL
  Filled 2015-12-14 (×7): qty 1

## 2015-12-14 MED ORDER — ONDANSETRON HCL 4 MG PO TABS: 4.0000 mg | ORAL_TABLET | Freq: Four times a day (QID) | ORAL | Status: DC | PRN

## 2015-12-14 MED ORDER — SODIUM CHLORIDE 0.9% FLUSH: 3.0000 mL | INTRAVENOUS | Status: DC | PRN

## 2015-12-14 MED ORDER — FUROSEMIDE 10 MG/ML IJ SOLN
40.0000 mg | Freq: Every day | INTRAMUSCULAR | Status: DC
  Administered 2015-12-15: 40 mg via INTRAVENOUS
  Filled 2015-12-14 (×2): qty 4

## 2015-12-14 MED ORDER — ALPRAZOLAM 0.5 MG PO TABS: 0.5000 mg | ORAL_TABLET | Freq: Two times a day (BID) | ORAL | Status: DC | PRN

## 2015-12-14 MED ORDER — ENOXAPARIN SODIUM 30 MG/0.3ML ~~LOC~~ SOLN
30.0000 mg | SUBCUTANEOUS | Status: DC
  Administered 2015-12-14: 30 mg via SUBCUTANEOUS
  Filled 2015-12-14: qty 0.3

## 2015-12-14 MED ORDER — DILTIAZEM HCL ER COATED BEADS 240 MG PO CP24: 240.0000 mg | ORAL_CAPSULE | Freq: Every day | ORAL | Status: DC

## 2015-12-14 MED ORDER — INFLUENZA VAC SPLIT QUAD 0.5 ML IM SUSY: 0.5000 mL | PREFILLED_SYRINGE | INTRAMUSCULAR | Status: DC

## 2015-12-14 MED ORDER — ALPRAZOLAM 0.5 MG PO TABS
0.5000 mg | ORAL_TABLET | Freq: Three times a day (TID) | ORAL | Status: DC | PRN
  Administered 2015-12-14 – 2015-12-16 (×3): 0.5 mg via ORAL
  Filled 2015-12-14 (×3): qty 1

## 2015-12-14 MED ORDER — INSULIN ASPART 100 UNIT/ML ~~LOC~~ SOLN
0.0000 [IU] | Freq: Three times a day (TID) | SUBCUTANEOUS | Status: DC
  Administered 2015-12-14: 2 [IU] via SUBCUTANEOUS
  Administered 2015-12-15: 3 [IU] via SUBCUTANEOUS
  Administered 2015-12-16 – 2015-12-17 (×2): 2 [IU] via SUBCUTANEOUS

## 2015-12-14 MED ORDER — DULOXETINE HCL 30 MG PO CPEP
30.0000 mg | ORAL_CAPSULE | Freq: Every day | ORAL | Status: DC
Start: 2015-12-14 — End: 2015-12-17
  Administered 2015-12-17: 30 mg via ORAL
  Filled 2015-12-14 (×4): qty 1

## 2015-12-14 NOTE — Progress Notes (Signed)
FPTS Interim Progress Note  Patient seen this AM.  She has been persistently tachycardic to 110-120s since admission.  Changed Atenolol to Metoprolol 100 BID per discussion with FM team.  Cardiology consulted, appreciate recs and will continue to follow. Echocardiogram pending.  Not fluid overloaded on exam this morning.  Reduced Lasix frequency from BID to once daily.    Lindsey Kim, MD 12/14/2015, 11:52 AM PGY-1, [facility] Medicine Service pager [phone]

## 2015-12-14 NOTE — Progress Notes (Deleted)
Cardiology Office Note   Date:  12/14/2015   ID:  [name], DOB [birth year], MRN [id number]  PCP:  Lavon Paganini, MD  Cardiologist:  Rosaria Ferries, PA-C   No chief complaint on file.   History of Present Illness: [name] is a 82 y.o. female with a history of ***    [name] presents for ***   Past Medical History:  Diagnosis Date  . Anemia   . Anxiety   . CAD (coronary artery disease)   . CHF (congestive heart failure) (HCC)    EF 55% on 01/05/11, trace AR w/ Bioprosthetic valve  . DM2 (diabetes mellitus, type 2) ([place])   . GERD (gastroesophageal reflux disease)   . HLD (hyperlipidemia)   . HTN (hypertension)   . PAF (paroxysmal atrial fibrillation) ([place])   . Restless legs syndrome (RLS)   . Tobacco abuse     Past Surgical History:  Procedure Laterality Date  . AORTIC VALVE REPLACEMENT  08/25/2002   tissue valve  . CARDIOVERSION N/A 05/05/2014   Procedure: CARDIOVERSION;  Surgeon: Dorothy Spark, MD;  Location: [facility];  Service: Cardiovascular;  Laterality: N/A;  . PACEMAKER INSERTION  04/02/2012   [facility]  . PERMANENT PACEMAKER GENERATOR CHANGE N/A 04/02/2012   Procedure: PERMANENT PACEMAKER GENERATOR CHANGE;  Surgeon: Sanda Klein, MD;  Location: [facility] CATH LAB;  Service: Cardiovascular;  Laterality: N/A;    No current facility-administered medications for this visit.    No current outpatient prescriptions on file.   Facility-Administered Medications Ordered in Other Visits  Medication Dose Route Frequency Provider Last Rate Last Dose  . 0.9 %  sodium chloride infusion  250 mL Intravenous PRN Carlyle Dolly, MD      . ALPRAZolam Duanne Moron) tablet 1 mg  1 mg Oral BID PRN Carlyle Dolly, MD      . atenolol (TENORMIN) tablet 100 mg  100 mg Oral Daily Carlyle Dolly, MD      . cetaphil (CETAPHIL) lotion   Topical BID Carlyle Dolly, MD   Stopped at 12/14/15 [phone]  . diltiazem (CARDIZEM CD) 24 hr  capsule 120 mg  120 mg Oral Daily Carlyle Dolly, MD      . DULoxetine (CYMBALTA) DR capsule 30 mg  30 mg Oral Daily Carlyle Dolly, MD      . enoxaparin (LOVENOX) injection 30 mg  30 mg Subcutaneous Q24H Carlyle Dolly, MD      . ezetimibe (ZETIA) tablet 10 mg  10 mg Oral Daily Carlyle Dolly, MD      . ferrous sulfate tablet 325 mg  325 mg Oral BID WC Carlyle Dolly, MD      . fluticasone (FLONASE) 50 MCG/ACT nasal spray 2 spray  2 spray Each Nare Daily Carlyle Dolly, MD      . furosemide (LASIX) injection 40 mg  40 mg Intravenous BID Carlyle Dolly, MD      . HYDROcodone-acetaminophen (NORCO) 7.5-325 MG per tablet 1 tablet  1 tablet Oral BID PRN Carlyle Dolly, MD      . Derrill Memo ON 12/15/2015] Influenza vac split quadrivalent PF (FLUARIX) injection 0.5 mL  0.5 mL Intramuscular Tomorrow-1000 Carlyle Dolly, MD      . insulin aspart (novoLOG) injection 0-15 Units  0-15 Units Subcutaneous TID WC Carlyle Dolly, MD      . MEDLINE mouth rinse  15 mL Mouth Rinse BID Carlyle Dolly, MD      .  ondansetron (ZOFRAN) tablet 4 mg  4 mg Oral Q6H PRN Carlyle Dolly, MD       Or  . ondansetron [facility]) injection 4 mg  4 mg Intravenous Q6H PRN Carlyle Dolly, MD      . pantoprazole (PROTONIX) EC tablet 40 mg  40 mg Oral Daily Carlyle Dolly, MD      . polyethylene glycol (MIRALAX / GLYCOLAX) packet 17 g  17 g Oral Daily PRN Carlyle Dolly, MD      . rosuvastatin (CRESTOR) tablet 10 mg  10 mg Oral Daily Carlyle Dolly, MD      . sodium chloride flush (NS) 0.9 % injection 3 mL  3 mL Intravenous Q12H Carlyle Dolly, MD      . sodium chloride flush (NS) 0.9 % injection 3 mL  3 mL Intravenous Q12H Carlyle Dolly, MD      . sodium chloride flush (NS) 0.9 % injection 3 mL  3 mL Intravenous PRN Carlyle Dolly, MD        Allergies:   Pravachol [pravastatin sodium] and Tape    Social History:  The patient  reports that  she has never smoked. Her smokeless tobacco use includes Snuff. She reports that she does not drink alcohol or use drugs.   Family History:  The patient's family history includes Heart attack in her brother; Kidney disease in her sister.    ROS:  Please see the history of present illness. All other systems are reviewed and negative.    PHYSICAL EXAM: VS:  There were no vitals taken for this visit. , BMI There is no height or weight on file to calculate BMI. GEN: Well nourished, well developed, female in no acute distress HEENT: normal for age  Neck: no JVD, no carotid bruit, no masses Cardiac: RRR; no murmur, no rubs, or gallops Respiratory:  clear to auscultation bilaterally, normal work of breathing GI: soft, nontender, nondistended, + BS MS: no deformity or atrophy; no edema; distal pulses are 2+ in all 4 extremities  Skin: warm and dry, no rash Neuro:  Strength and sensation are intact Psych: euthymic mood, full affect   EKG:  EKG {ACTION; IS/IS YIR:[id number]} ordered today. The ekg ordered today demonstrates ***   Recent Labs: 06/14/2015: ALT 10 12/13/2015: B Natriuretic Peptide 936.4 12/14/2015: BUN 16; Creatinine, Ser 1.25; Hemoglobin 8.4; Platelets 213; Potassium 3.6; Sodium 139    Lipid Panel    Component Value Date/Time   CHOL 305 (H) 04/06/2009 2033   TRIG 203 (H) 04/06/2009 2033   HDL 45 04/06/2009 2033   CHOLHDL 6.8 Ratio 04/06/2009 2033   VLDL 41 (H) 04/06/2009 2033   LDLCALC 219 (H) 04/06/2009 2033   LDLDIRECT 141 (H) 06/14/2015 1507     Wt Readings from Last 3 Encounters:  12/14/15 130 lb 1.6 oz (59 kg)  12/10/15 136 lb (61.7 kg)  11/18/15 136 lb (61.7 kg)     Other studies Reviewed: Additional studies/ records that were reviewed today include: ***.  ASSESSMENT AND PLAN:  1.  ***   Current medicines are reviewed at length with the patient today.  The patient {ACTIONS; HAS/DOES NOT HAVE:19233} concerns regarding medicines.  The following  changes have been made:  {PLAN; NO CHANGE:13088:s}  Labs/ tests ordered today include: *** No orders of the defined types were placed in this encounter.    Disposition:   FU with ***  Signed, Rosaria Ferries, PA-C  12/14/2015 8:08 AM  [place] Phone: [phone]; Fax: [phone]  This note was written with the assistance of speech recognition software. Please excuse any transcriptional errors.

## 2015-12-14 NOTE — Evaluation (Signed)
Physical Therapy Evaluation Patient Details Name: [name] MRN: [id number] DOB: [birth year] Today's Date: 12/14/2015   History of Present Illness  82 yo female with PMH of PAF on Eliquis, SSS s/p PPM (leads 2005, generator change 2014 [employer] [facility]), valvular heart disease with a biological aortic valve prosthesis (21 mm, 2005, Dr. Servando Snare), severe hypercholesterolemia who presented with c/o dyspnea and palpitations for the past couple of weeks  Clinical Impression  Patient demonstrates deficits in functional mobility as indicated below. Will need continued skilled PT to address deficits and maixmize function. Will see as indicated and progress as tolerated.  OF NOTE: VSS throughout session (HR max mid 120s) no chest pain or SOB noted. Nsg aware  Recommend supervision and HHPT upon acute discharge    Follow Up Recommendations Home health PT;Supervision/Assistance - 24 hour    Equipment Recommendations   (patient states that she has cane and RW)    Recommendations for Other Services       Precautions / Restrictions Precautions Precautions: Fall      Mobility  Bed Mobility Overal bed mobility: Needs Assistance Bed Mobility: Supine to Sit;Sit to Supine     Supine to sit: Supervision Sit to supine: Supervision   General bed mobility comments: no physical assist need to come to EOB, supervision for safety   Transfers Overall transfer level: Needs assistance Equipment used: None Transfers: Sit to/from Stand Sit to Stand: Min guard         General transfer comment: min guard for safety and stability, patient reaching out for counter upon standing  Ambulation/Gait Ambulation/Gait assistance: Min guard;Min assist Ambulation Distance (Feet): 160 Feet Assistive device: 1 person hand held assist Gait Pattern/deviations: Step-through pattern;Decreased stride length;Narrow base of support;Drifts right/left;Leaning posteriorly Gait velocity: decreased Gait velocity  interpretation: Below normal speed for age/gender General Gait Details: min guard to min assist for stability, multiple balance checks posteriorly. Improved with increased cadence  Stairs            Wheelchair Mobility    Modified Rankin (Stroke Patients Only)       Balance                                             Pertinent Vitals/Pain Pain Assessment: Faces Faces Pain Scale: Hurts little more Pain Location: BLEs (pt reports restless legs) Pain Descriptors / Indicators: Restless Pain Intervention(s): Monitored during session    Home Living Family/patient expects to be discharged to:: Private residence Living Arrangements: Alone Available Help at Discharge: Family Type of Home: Apartment Home Access: Level entry     Home Layout: One level Home Equipment: Environmental consultant - 2 wheels;Cane - single point      Prior Function Level of Independence: Independent with assistive device(s) (uses a cane sometimes)               Hand Dominance   Dominant Hand: Right    Extremity/Trunk Assessment   Upper Extremity Assessment: Generalized weakness           Lower Extremity Assessment: Generalized weakness         Communication   Communication: HOH  Cognition Arousal/Alertness: Awake/alert Behavior During Therapy: WFL for tasks assessed/performed Overall Cognitive Status: No family/caregiver present to determine baseline cognitive functioning                      General  Comments      Exercises     Assessment/Plan    PT Assessment Patient needs continued PT services  PT Problem List Decreased strength;Decreased activity tolerance;Decreased balance;Decreased mobility;Cardiopulmonary status limiting activity;Pain          PT Treatment Interventions DME instruction;Gait training;Functional mobility training;Therapeutic activities;Therapeutic exercise;Balance training;Patient/family education    PT Goals (Current goals can be  found in the Care Plan section)  Acute Rehab PT Goals Patient Stated Goal: to go home PT Goal Formulation: With patient Time For Goal Achievement: 12/28/15 Potential to Achieve Goals: Good    Frequency Min 3X/week   Barriers to discharge        Co-evaluation               End of Session Equipment Utilized During Treatment: Gait belt Activity Tolerance: Patient tolerated treatment well Patient left: in bed;with call bell/phone within reach;with bed alarm set Nurse Communication: Mobility status         Time: 2482-5003 PT Time Calculation (min) (ACUTE ONLY): 20 min   Charges:   PT Evaluation $PT Eval Moderate Complexity: 1 Procedure     PT G CodesDuncan Dull January 12, 2016, 5:06 PM Alben Deeds, [place] DPT  [phone]

## 2015-12-14 NOTE — ED Notes (Signed)
Patient is on nasal cannula at 2 Lpm, patient is mouth breathing and would benefit from the venturi mask.  Respiratory called and patient will be placed on the venturi mask per respiratory discretion.

## 2015-12-14 NOTE — H&P (Signed)
[facility] Admission History and Physical Service Pager: [phone]  Patient name: [name] Medical record number: [id number] Date of birth: [birth year] Age: 82 y.o. Gender: female  Primary Care Provider: Lavon Paganini, MD Consultants: None  Code Status: DO NOT RESUSCITATE per discussion on admission  Chief Complaint: dyspnea  Assessment and Plan: [name] is a 82 y.o. female presenting with dyspnea and palpitations . PMH is significant for CAD with a pacemaker, paroxysmal A. fib with chronic anticoagulation, status post aortic valve replacement, CHF, CKD stage III, hearing loss, restless leg syndrome, DM, HTN, anxiety, depression, anemia, GERD, actinic keratosis, bilateral knee arthritis, chronic dry skin.   Acute Respiratory Failure: Palpitations 1 week with increased dyspnea and generalized weakness for the last couple days. In ED patient was tachycardic and hypoxic to 84% O2, she was placed on a Ventimask. Vital signs on admission: Afebrile, tachycardic to 115 with blood pressure to 170/104. RR 20 but is satting in the high 90's on 3 L nasal cannula. Physical exam notable for tachycardia, tachypnea, and bilateral crackles heard best in lower lung fields, no edema. Labs remarkable for BNP of 936.4. Creatinine 1.24. WBC 11.1. Hemoglobin 9.1. D-dimer 1.37, CTA negative for PE although positive for moderate to large right and small to moderate left pleural effusions with compressive atelectasis. which are also seen on chest x-ray. Leading differentials include CHF exacerbation versus arrhythmia. CHF is on problem list however her last echocardiogram in March 2014 showed normal bioprosthetic function and normal LVEF 55-60 %. This could also be atypical presentation of acute MI although EKG does not show any ST changes and troponin negative at 0.02.  No signs of pneumonia and no known pulmonary disease. -Admit to telemetry, attending Dr.  Mingo Amber -Vital signs per floor protocol -Continuous cardiac monitoring -Continuous pulse ox -Supplemental oxygen with goal oxygen above 92% -Will diurese with 40 mg IV Lasix twice a day -Strict I's and O's -Daily weights -Echocardiogram -A.m. EKG  -Due to extensive cardiac history, consult cardiology in the morning -Repeat chest x-ray in 24 hours for repeat stat if worsening respiratory status -Consider consult for pleural effusion drainage if patient does not improve with diuresis or pleural effusions worsen  Cardiac: Patient has extensive cardiac history including paroxysmal atrial fibrillation on chronic anticoagulation (Eliquis), sinus node dysfunction s/p dual chamber pacemaker (leads 2005, generator change 2014 [employer]), valvular heart disease with a biological aortic valve prosthesis (2005, Dr. Servando Snare), hypertension, CHF, and severe hypercholesterolemia. Cardiac catheterization in 2005 showed 40% LAD stenosis. EKG on admission showing sinus tachycardia and an old Inferior infarct, appears to be in sinus rhythm, no ST changes. Troponin 0.02 in ED. -Consider cardiology consult in the morning -continuous cardiac monitoring as above -A.m. EKG, echocardiogram as above  -Lovenox for DVT prophylaxis, holding home Eliquis -Continue home diltiazem 120 mg daily, atenolol 100 mg daily.  -Continue Crestor 10 mg daily and is that he 10 g daily  CKD stage III: Baseline creatinine appears to be 1.24-1.27. Patient at baseline at 1.24 today -Avoid nephrotoxic agents -Daily BMP -Hold home Benzapril  Diabetes: Controlled, last A1c in 09/02/2015 was 5.7. Home medications include metformin 500 mg twice a day next -hold home medication -Sliding scale insulin while inpatient -CBGs  GERD: Home medications include Prilosec 20 mg daily -Pantoprazole 40 mg daily while inpatient  Microcytic Anemia: Likely iron deficiency anemia. Hemoglobin of 9.1 on admission. Baseline appears to be 8.3-9.1. Iron  studies 3 months ago showing low iron at 29.  Endorsing some PICA symptoms for crushed ice.  - Daily CBC - continue home iron   Chronic dry skin:  -Lotion twice a day  Chronic pain: Home medications include Norco 7.5-325 mg tablet twice a day when necessary and Cymbalta 30 mg daily -Resume home medications  Anxiety and depression: Home medications include Xanax 1 mg twice a day when necessary for anxiety and Cymbalta 30 mg daily -Resume home medications   FEN/GI: Saline lock IV, heart healthy diet  Prophylaxis: Lovenox   Disposition: Admit to telemetry, attending Dr. Mingo Amber  History of Present Illness:  [name] is a 82 y.o. female presenting with palpitations and shortness of breath.  Patient comes in today stating that she has had palpitations for the last week. She tried to come into the emergency room about 4 days ago but was not seen because she did not want to wait longer in the waiting room. Over the last couple days patient endorses some increased shortness of breath with some generalized weakness. She could not comment on any particular activity that made her short of breath but stated that doing her normal activities caused dyspnea. She went to the cardiologist yesterday. Patient states that she has been in the hospital before for episode like this and had a Place on oxygen. She does not use any oxygen therapy at home and never has before. Has never been diagnosed with COPD or any lung problems. Denies any cough with sputum. States sometimes she is feels better if she does not lay flat. Denies any long trips or previous DVT.  She denies fever, any cough, chest pain, immobility, extremity swelling, extremity pain or numbness, dysuria.   Patient states that she does most of her ADLs by herself. She lives in an apartment by herself and her daughter lives across the street from her and helps her when needed.  Review Of Systems: Per HPI with the following additions: See  history of present illness  ROS  Patient Active Problem List   Diagnosis Date Noted  . Hearing loss 09/02/2015  . Restless leg syndrome 09/02/2015  . CKD stage 3 due to type 2 diabetes mellitus ([place]) 06/15/2015  . SSS (sick sinus syndrome) ([place]) 03/22/2015  . Presbycusis of both ears 03/10/2015  . Malignant neoplasm skin of face 03/10/2015  . Encounter for chronic pain management 06/02/2014  . Recurrent falls 06/02/2014  . S/P AVR (aortic valve replacement) 05/17/2014  . Atrial flutter ([place]) 05/01/2014  . Constipation 02/10/2014  . Allergic rhinitis 08/06/2013  . Chronic pain syndrome 05/22/2013  . Insomnia 03/13/2013  . Dry skin dermatitis 03/13/2013  . Pacemaker 01/08/2013  . Mild nonproliferative diabetic retinopathy(362.04) 12/05/2012  . TOBACCO USER 03/01/2009  . OSTEOPENIA 08/10/2008  . B12 DEFICIENCY 08/07/2008  . GERD 08/07/2008  . Essential hypertension, benign 03/26/2008  . UNSPECIFIED PROBLEMS W/LIMBS AND OTHER PROBLEMS 02/26/2008  . [facility] COMPLICATION DUE HEART VALVE PROSTHESIS 11/22/2007  . Hyperlipidemia associated with type 2 diabetes mellitus ([place]) 10/23/2007  . ACTINIC KERATOSIS 10/23/2007  . OSTEOARTHRITIS, KNEES, BILATERAL 10/23/2007  . ANXIETY DEPRESSION 08/20/2007  . CAD (coronary artery disease) 04/05/2007  . ANEMIA NEC 09/12/2006  . Diabetes mellitus type 2, controlled ([place]) 05/08/2006  . Congestive heart failure ([place]) 05/08/2006    Past Medical History: Past Medical History:  Diagnosis Date  . Anemia   . Anxiety   . CAD (coronary artery disease)   . CHF (congestive heart failure) (HCC)    EF 55% on 01/05/11, trace AR w/ Bioprosthetic  valve  . DM2 (diabetes mellitus, type 2) ([place])   . GERD (gastroesophageal reflux disease)   . HLD (hyperlipidemia)   . HTN (hypertension)   . PAF (paroxysmal atrial fibrillation) ([place])   . Restless legs syndrome (RLS)   . Tobacco abuse     Past Surgical History: Past Surgical History:  Procedure Laterality  Date  . AORTIC VALVE REPLACEMENT  08/25/2002   tissue valve  . CARDIOVERSION N/A 05/05/2014   Procedure: CARDIOVERSION;  Surgeon: Dorothy Spark, MD;  Location: [facility];  Service: Cardiovascular;  Laterality: N/A;  . PACEMAKER INSERTION  04/02/2012   [employer]  . PERMANENT PACEMAKER GENERATOR CHANGE N/A 04/02/2012   Procedure: PERMANENT PACEMAKER GENERATOR CHANGE;  Surgeon: Sanda Klein, MD;  Location: [facility];  Service: Cardiovascular;  Laterality: N/A;    Social History: Social History  Substance Use Topics  . Smoking status: Never Smoker  . Smokeless tobacco: Current User    Types: Snuff  . Alcohol use No    Please also refer to relevant sections of EMR.  Family History: Family History  Problem Relation Age of Onset  . Heart attack Brother   . Kidney disease Sister     Allergies and Medications: Allergies  Allergen Reactions  . Pravachol [Pravastatin Sodium]     myalgias   No current facility-administered medications on file prior to encounter.    Current Outpatient Prescriptions on File Prior to Encounter  Medication Sig Dispense Refill  . ALPRAZolam (XANAX) 1 MG tablet Take 1 tablet (1 mg total) by mouth 2 (two) times daily as needed for anxiety. 60 tablet 0  . atenolol (TENORMIN) 100 MG tablet TAKE 1 TABLET (100 MG TOTAL) BY MOUTH DAILY. 90 tablet 1  . benazepril (LOTENSIN) 20 MG tablet TAKE 1 TABLET (20 MG TOTAL) BY MOUTH 2 (TWO) TIMES DAILY. 180 tablet 1  . Blood Glucose Monitoring Suppl (ONE TOUCH ULTRA 2) W/DEVICE KIT   0  . CRESTOR 10 MG tablet TAKE 1 TABLET (10 MG TOTAL) BY MOUTH DAILY. 90 tablet 2  . diltiazem (CARDIZEM CD) 120 MG 24 hr capsule TAKE 1 CAPSULE (120 MG TOTAL) BY MOUTH DAILY. 30 capsule 5  . DULoxetine (CYMBALTA) 30 MG capsule Take 1 capsule (30 mg total) by mouth daily. 30 capsule 0  . ELIQUIS 2.5 MG TABS tablet TAKE 1 TABLET (2.5 MG TOTAL) BY MOUTH 2 (TWO) TIMES DAILY. 60 tablet 11  . ferrous sulfate 325 (65 FE) MG tablet Take 1 tablet  (325 mg total) by mouth 2 (two) times daily with a meal. 60 tablet 3  . fluticasone (FLONASE) 50 MCG/ACT nasal spray PLACE 2 SPRAYS INTO BOTH NOSTRILS DAILY. 16 g 5  . HYDROcodone-acetaminophen (NORCO) 7.5-325 MG tablet Take 1 tablet by mouth 2 (two) times daily as needed. 60 tablet 0  . metFORMIN (GLUCOPHAGE) 500 MG tablet Take 1 tablet (500 mg total) by mouth 2 (two) times daily with a meal. 60 tablet 3  . omeprazole (PRILOSEC) 20 MG capsule TAKE 1 CAPSULE (20 MG TOTAL) BY MOUTH DAILY. 90 capsule 1  . ONE TOUCH ULTRA TEST test strip TEST BLOOD SUGAR TWICE A DAY 50 each 4  . ZETIA 10 MG tablet TAKE 1 TABLET (10 MG TOTAL) BY MOUTH DAILY. 90 tablet 3    Objective: BP (!) 171/106   Pulse 115   Temp 97.7 F (36.5 C) (Oral)   Resp (!) 36   SpO2 92%  Exam: General: Elderly female sitting up in bed, in no acute  distress, pleasant, Slightly difficult to understand speech as patient is missing teeth Eyes:  Nonicteric sclera, pupils equal and reactive to light, extraocular muscles intact ENTM:  No nasal discharge, slight hyperpigmentation of tongue, tachy oral mucosa, missing teeth, no pharyngeal erythema Neck:  No lymphadenopathy Cardiovascular:  Tachycardic rate, regular rhythm, loud heart sounds, no murmur appreciated Respiratory:  On 3 L nasal cannula, tachypneic, no intercostal retractions, faint crackles appreciated bilateral lung bases, no wheezing Gastrointestinal:  Soft, slightly distended, nontender to palpation, normal bowel sounds MSK:  Pacemaker visualized under skin on right parasternal region Derm:  Diffuse dry skin, patchy areas of ecchymosis on extremities, healing lesion on nose Neuro:  Alert and oriented 3, 5 out of 5 strength in bilateral upper and lower extremities, sensation grossly intact throughout, gait not observed, no tremor Psych:  Normal mood and affect  Labs and Imaging: CBC BMET   Recent Labs Lab 12/13/15 2050  WBC 11.1*  HGB 9.1*  HCT 30.9*  PLT 259     Recent Labs Lab 12/13/15 2050  NA 137  K 3.9  CL 106  CO2 22  BUN 18  CREATININE 1.24*  GLUCOSE 171*  CALCIUM 9.1     Ct Angio Chest Pe W Or Wo Contrast  Result Date: 12/14/2015 CLINICAL DATA:  Heart palpitations and dyspnea. EXAM: CT ANGIOGRAPHY CHEST WITH CONTRAST TECHNIQUE: Multidetector CT imaging of the chest was performed using the standard protocol during bolus administration of intravenous contrast. Multiplanar CT image reconstructions and MIPs were obtained to evaluate the vascular anatomy. CONTRAST:  75 mL of Isovue 370 administered IV COMPARISON:  11/13/2009 chest CT, chest radiograph from 12/13/2015 FINDINGS: Cardiovascular: The study is of quality for the evaluation of pulmonary embolism. However due to respiratory motion artifacts, there is slight image degradation. There are no filling defects in the central, lobar, segmental or subsegmental pulmonary artery branches to suggest acute pulmonary embolism. Atherosclerosis of the aortic arch. Normal configuration of great vessels also containing atherosclerotic calcifications. Borderline cardiomegaly. No significant pericardial fluid/thickening. Coronary arteriosclerosis. Mild pectus deformity of the anterior chest wall impressing upon the right heart border. Aortic valvular replacement with median sternotomy sutures noted. Mediastinum/Nodes: No discrete thyroid nodules. Mild air-filled appearance of the esophagus. No pathologically enlarged axillary, mediastinal or hilar lymph nodes. Lungs/Pleura: No pneumothorax. Moderate to large right and small to moderate left pleural effusions with compressive atelectasis. Upper abdomen: Some reflux of contrast into the hepatic veins. Musculoskeletal:  No acute osseous abnormality. IMPRESSION: Bilateral pleural effusions with compressive atelectasis. No pulmonary embolus. Coronary arteriosclerosis.  Aortic atherosclerosis. Electronically Signed   By: Ashley Royalty M.D.   On: 12/14/2015 00:54   Dg  Chest Portable 1 View  Result Date: 12/13/2015 CLINICAL DATA:  Subacute onset of palpitations and shortness of breath. Generalized chest pain. Initial encounter. EXAM: PORTABLE CHEST 1 VIEW COMPARISON:  Chest radiograph performed 05/04/2014 FINDINGS: The lungs are well-aerated. Small to moderate right and small left pleural effusions are noted, with bibasilar airspace opacities, possibly reflecting mild pulmonary edema. There is no evidence of pneumothorax. The cardiomediastinal silhouette is enlarged. The patient is status post median sternotomy. A valve replacement is noted. A pacemaker is noted overlying the right chest wall, with leads ending overlying the right atrium and right ventricle. No acute osseous abnormalities are seen. IMPRESSION: Small to moderate right and small left pleural effusions, with bibasilar airspace opacities, possibly reflecting mild pulmonary edema. Cardiomegaly noted. Electronically Signed   By: Garald Balding M.D.   On: 12/13/2015 22:12  Carlyle Dolly, MD 12/14/2015, 1:26 AM PGY-2, [place] Intern pager: [phone], text pages welcome

## 2015-12-14 NOTE — Plan of Care (Signed)
Problem: Education: Goal: Knowledge of [facility] Education information/materials will improve Outcome: Progressing Patient aware of plan of care.  Patient denying pain.  RN instructed patient to call and wait for staff assistance prior to getting out of bed.  Patient stated understanding.

## 2015-12-14 NOTE — Consult Note (Signed)
Cardiology Consult    Patient ID: WINNA GOLLA MRN: [id number], DOB/AGE: 1929-12-17   Admit date: 12/13/2015 Date of Consult: 12/14/2015  Primary Physician: Lavon Paganini, MD Primary Cardiologist: Dr. Sallyanne Kuster Requesting Provider: Dr. Mingo Amber Reason for Consultation: Tachycardia  Patient Profile    83 yo female with PMH of PAF on Eliquis, SSS s/p PPM (leads 2005, generator change 2014 St. Jude), valvular heart disease with a biological aortic valve prosthesis (21 mm, 2005, Dr. Servando Snare), severe hypercholesterolemia who presented with c/o dyspnea and palpitations for the past couple of weeks.   Past Medical History   Past Medical History:  Diagnosis Date  . Anemia   . Anxiety   . CAD (coronary artery disease)   . CHF (congestive heart failure) (HCC)    EF 55% on 01/05/11, trace AR w/ Bioprosthetic valve  . DM2 (diabetes mellitus, type 2) ([place])   . GERD (gastroesophageal reflux disease)   . HLD (hyperlipidemia)   . HTN (hypertension)   . PAF (paroxysmal atrial fibrillation) ([place])   . Restless legs syndrome (RLS)   . Tobacco abuse     Past Surgical History:  Procedure Laterality Date  . AORTIC VALVE REPLACEMENT  08/25/2002   tissue valve  . CARDIOVERSION N/A 05/05/2014   Procedure: CARDIOVERSION;  Surgeon: Dorothy Spark, MD;  Location: [facility];  Service: Cardiovascular;  Laterality: N/A;  . PACEMAKER INSERTION  04/02/2012   [employer]  . PERMANENT PACEMAKER GENERATOR CHANGE N/A 04/02/2012   Procedure: PERMANENT PACEMAKER GENERATOR CHANGE;  Surgeon: Sanda Klein, MD;  Location: [facility];  Service: Cardiovascular;  Laterality: N/A;     Allergies  Allergies  Allergen Reactions  . Pravachol [Pravastatin Sodium] Other (See Comments)    myalgias  . Tape Other (See Comments)    Patient states that she has skin cancer and that it is thin (PLEASE USE COBAN WRAP INSTEAD OF TAPE!)    History of Present Illness    Mrs. Keir is a 82 yo female with PMH of of  PAF on Eliquis, SSS s/p PPM (leads 2005, generator change 2014 St. Jude), valvular heart disease with a biological aortic valve prosthesis (21 mm, 2005, Dr. Servando Snare), severe hypercholesterolemia. Her last heart cath was in 2005 that showed 40% LAD stenosis.   Last Echo in 2014 showed normal EF with no WMA, Aortic valve:  Trivial regurgitation. Valve area: 1.36cm^2(VTI). Valve area:1.25cm^2 (Vmax).  Mitral valve: Calcified annulus. Valve area by continuity equation (using LVOT flow): 3.23cm^2.  She was last seen in the office earlier this year and reported feeling well. No changes were made her medications at that time. Maintaining SR since her cardioversion about a year prior to that visit.   Over the past couple of weeks, she has noted worsening dyspnea and felt palpitations while at home. Has noticed some intermittent swelling in her lower extremities as well. Reports the past couple of days her breathing significantly worsened and she presented to the ED. Had an appt today at the office, but felt she could not wait. Denies any chest pain.   On admission labs showed BNP 936, Cr 1.25, Hgb 9.1, stable electrolytes. CXR showed bilateral pleural effusions with right >left. EKG reads a ST but p waves are difficulty to identify. She was placed on IV lasix with 1L UOP thus far. Home medications were changed from atenolol to metoprolol given her tachycardia by primary team.   Inpatient Medications    . cetaphil   Topical BID  . diltiazem  120  mg Oral Daily  . DULoxetine  30 mg Oral Daily  . enoxaparin (LOVENOX) injection  30 mg Subcutaneous Q24H  . ezetimibe  10 mg Oral Daily  . ferrous sulfate  325 mg Oral BID WC  . fluticasone  2 spray Each Nare Daily  . [START ON 12/15/2015] furosemide  40 mg Intravenous Daily  . [START ON 12/15/2015] Influenza vac split quadrivalent PF  0.5 mL Intramuscular Tomorrow-1000  . insulin aspart  0-15 Units Subcutaneous TID WC  . mouth rinse  15 mL Mouth Rinse  BID  . metoprolol tartrate  100 mg Oral BID  . pantoprazole  40 mg Oral Daily  . [START ON 12/15/2015] rosuvastatin  20 mg Oral Daily  . sodium chloride flush  3 mL Intravenous Q12H  . sodium chloride flush  3 mL Intravenous Q12H    Family History    Family History  Problem Relation Age of Onset  . Heart attack Brother   . Kidney disease Sister     Social History    Social History   Social History  . Marital status: Widowed    Spouse name: N/A  . Number of children: 5  . Years of education: 5   Occupational History  . Retired-Farmer Retired   Social History Main Topics  . Smoking status: Never Smoker  . Smokeless tobacco: Current User    Types: Snuff  . Alcohol use No  . Drug use: No  . Sexual activity: Not on file   Other Topics Concern  . Not on file   Social History Narrative   Health Care POA:    Emergency Contact: daughter, [name] [phone]   End of Life Plan: gave pt ad pamphlet   Who lives with you: daughter and 2 grandchildren   Any pets: 2 dogs   Diet: pt has a varied diet of protein, starch and vegetables.   Exercise: pt garden's daily   Seatbelts: pt does not wear seatbelt because of pace maker   Sun Exposure/Protection: pt does not use sun protection, has irregular dermatology appointments.   Hobbies: gardening, sitting on porch.           Review of Systems    General:  No chills, fever, night sweats or weight changes.  Cardiovascular:  See HPI Dermatological: No rash, lesions/masses Respiratory: See HPI Urologic: No hematuria, dysuria Abdominal:   No nausea, vomiting, diarrhea, bright red blood per rectum, melena, or hematemesis Neurologic:  No visual changes, wkns, changes in mental status. All other systems reviewed and are otherwise negative except as noted above.  Physical Exam    Blood pressure (!) 155/99, pulse (!) 115, temperature 97.5 F (36.4 C), temperature source Oral, resp. rate (!) 22, height 5\' 4"  (1.626 m), weight 130  lb 1.6 oz (59 kg), SpO2 98 %.  General: Pleasant older female, NAD Psych: Normal affect. Neuro: Alert and oriented X 3. Moves all extremities spontaneously. HEENT: Normal  Neck: Supple without bruits or JVD. Lungs:  Resp regular and unlabored, diminished R>L with mild crackles. Heart: tachy no s3, s4, 3/6 systolic murmur. Abdomen: Soft, non-tender, non-distended, BS + x 4.  Extremities: No clubbing, cyanosis R>L LE edema. DP/PT/Radials 2+ and equal bilaterally.  Labs    Troponin [facility] Test)  Recent Labs  12/13/15 2110  TROPIPOC 0.02    Recent Labs  12/14/15 0846  TROPONINI <0.03   Lab Results  Component Value Date   WBC 9.3 12/14/2015   HGB 8.4 (L) 12/14/2015  HCT 28.2 (L) 12/14/2015   MCV 72.1 (L) 12/14/2015   PLT 213 12/14/2015    Recent Labs Lab 12/14/15 0612  NA 139  K 3.6  CL 104  CO2 26  BUN 16  CREATININE 1.25*  CALCIUM 9.2  GLUCOSE 108*   Lab Results  Component Value Date   CHOL 305 (H) 04/06/2009   HDL 45 04/06/2009   LDLCALC 219 (H) 04/06/2009   TRIG 203 (H) 04/06/2009   Lab Results  Component Value Date   DDIMER 1.37 (H) 12/13/2015     Radiology Studies    Ct Angio Chest Pe W Or Wo Contrast  Result Date: 12/14/2015 CLINICAL DATA:  Heart palpitations and dyspnea. EXAM: CT ANGIOGRAPHY CHEST WITH CONTRAST TECHNIQUE: Multidetector CT imaging of the chest was performed using the standard protocol during bolus administration of intravenous contrast. Multiplanar CT image reconstructions and MIPs were obtained to evaluate the vascular anatomy. CONTRAST:  75 mL of Isovue 370 administered IV COMPARISON:  11/13/2009 chest CT, chest radiograph from 12/13/2015 FINDINGS: Cardiovascular: The study is of quality for the evaluation of pulmonary embolism. However due to respiratory motion artifacts, there is slight image degradation. There are no filling defects in the central, lobar, segmental or subsegmental pulmonary artery branches to suggest  acute pulmonary embolism. Atherosclerosis of the aortic arch. Normal configuration of great vessels also containing atherosclerotic calcifications. Borderline cardiomegaly. No significant pericardial fluid/thickening. Coronary arteriosclerosis. Mild pectus deformity of the anterior chest wall impressing upon the right heart border. Aortic valvular replacement with median sternotomy sutures noted. Mediastinum/Nodes: No discrete thyroid nodules. Mild air-filled appearance of the esophagus. No pathologically enlarged axillary, mediastinal or hilar lymph nodes. Lungs/Pleura: No pneumothorax. Moderate to large right and small to moderate left pleural effusions with compressive atelectasis. Upper abdomen: Some reflux of contrast into the hepatic veins. Musculoskeletal:  No acute osseous abnormality. IMPRESSION: Bilateral pleural effusions with compressive atelectasis. No pulmonary embolus. Coronary arteriosclerosis.  Aortic atherosclerosis. Electronically Signed   By: Ashley Royalty M.D.   On: 12/14/2015 00:54   Dg Chest Portable 1 View  Result Date: 12/13/2015 CLINICAL DATA:  Subacute onset of palpitations and shortness of breath. Generalized chest pain. Initial encounter. EXAM: PORTABLE CHEST 1 VIEW COMPARISON:  Chest radiograph performed 05/04/2014 FINDINGS: The lungs are well-aerated. Small to moderate right and small left pleural effusions are noted, with bibasilar airspace opacities, possibly reflecting mild pulmonary edema. There is no evidence of pneumothorax. The cardiomediastinal silhouette is enlarged. The patient is status post median sternotomy. A valve replacement is noted. A pacemaker is noted overlying the right chest wall, with leads ending overlying the right atrium and right ventricle. No acute osseous abnormalities are seen. IMPRESSION: Small to moderate right and small left pleural effusions, with bibasilar airspace opacities, possibly reflecting mild pulmonary edema. Cardiomegaly noted.  Electronically Signed   By: Garald Balding M.D.   On: 12/13/2015 22:12    ECG & Cardiac Imaging    EKG: AF? Some ST depression noted in lateral leads (may rate related)  Echo: 3/14  Study Conclusions  - Left ventricle: The cavity size was normal. There was mild concentric hypertrophy. Systolic function was normal. The estimated ejection fraction was in the range of 55% to 60%. Wall motion was normal; there were no regional wall motion abnormalities. Left ventricular diastolic function parameters were normal. - Aortic valve: A bioprosthesis was present. Trivial regurgitation. Valve area: 1.36cm^2(VTI). Valve area: 1.25cm^2 (Vmax). - Mitral valve: Calcified annulus. Valve area by continuity equation (using LVOT flow): 3.23cm^2. -  Atrial septum: No defect or patent foramen ovale was identified. Impressions:  - Technically very difficult study. Atrial paced rhythm with intact A-V conduction. Normally functioning bioprosthetic AoV.  Assessment & Plan    82 yo female with PMH of PAF on Eliquis, SSS s/p PPM (leads 2005, generator change 2014 St. Jude), valvular heart disease with a biological aortic valve prosthesis (21 mm, 2005, Dr. Servando Snare), severe hypercholesterolemia who presented with c/o dyspnea and palpitations for the past couple of weeks.   1. Tachycardia: Admission EKG is difficulty to identify p waves. Question 2:1 atrial flutter on telemetry? Has a known Hx of PAF. Last remote transmission in 5/17 noted <1% AF. Home medications include Diltiazem and atenolol, along with Eliquis 2.5mg . Atenolol was changed to metoprolol this admission via primary team. -- Rates around 100-120, could increase diltiazem to 240mg  for better rate control.  -- Continue with home dose of diltiazem, Eliquis was held on admission by primary team and placed on Lovenox. Would restart Eliquis.  -- This patients CHA2DS2-VASc Score and unadjusted Ischemic Stroke Rate (% per year) is  equal to 7.2 % stroke rate/year from a score of 5 Above score calculated as 1 point each if present [CHF, HTN, DM, Vascular=MI/PAD/Aortic Plaque, Age if 65-74, or Female] Above score calculated as 2 points each if present [Age > 75, or Stroke/TIA/TE]  2. Acute Respiratory Failure: Increasing dyspnea over the past couple of weeks, also noted palpitations. CXR with bilateral pleural effusions. BNP 936. Question this is 2/2 the above stated. Currently on IV lasix with good UOP thus far and stable Cr.  -- check echo for EF and valve anatomy  3. AVR: Stable per last echo in 2014. Reports some occasional dizziness at times. No syncope  4. Anemia: Hgb 9.1>>8.4. Denies any active bleeding. On home Iron BID. Monitor CBC  5. HTN: Bp uncontrolled. Increase diltiazem as above.   Barnet Pall, NP-C Pager [phone] 12/14/2015, 1:05 PM  I have examined the patient and reviewed assessment and plan and discussed with patient.  Agree with above as stated.  I personally reviewed the ECG and telemetry.  Her rhythm appears to be a slow atrial flutter with 2:1 conduction.  Will increase diltiazem to slow heart rate.  Restart Eliquis for stroke prevention.  It was unclear why it was stopped when she came to the hospital.  Pleural effusions may be from diastolic dysfunction from increased heart rate.  If HR has been high for a longer time, there may be some associated systolic dysfunction as well due to tachycardia.  Echo pending.    Larae Grooms

## 2015-12-15 ENCOUNTER — Inpatient Hospital Stay (HOSPITAL_COMMUNITY): Payer: Medicare Other

## 2015-12-15 DIAGNOSIS — I5033 Acute on chronic diastolic (congestive) heart failure: Secondary | ICD-10-CM

## 2015-12-15 DIAGNOSIS — I495 Sick sinus syndrome: Secondary | ICD-10-CM

## 2015-12-15 DIAGNOSIS — R0600 Dyspnea, unspecified: Secondary | ICD-10-CM

## 2015-12-15 DIAGNOSIS — I509 Heart failure, unspecified: Secondary | ICD-10-CM

## 2015-12-15 DIAGNOSIS — I4891 Unspecified atrial fibrillation: Secondary | ICD-10-CM

## 2015-12-15 DIAGNOSIS — I10 Essential (primary) hypertension: Secondary | ICD-10-CM

## 2015-12-15 LAB — GLUCOSE, CAPILLARY
GLUCOSE-CAPILLARY: 122 mg/dL — AB (ref 65–99)
GLUCOSE-CAPILLARY: 116 mg/dL — AB (ref 65–99)
Glucose-Capillary: 168 mg/dL — ABNORMAL HIGH (ref 65–99)
Glucose-Capillary: 119 mg/dL — ABNORMAL HIGH (ref 65–99)

## 2015-12-15 LAB — CBC
HCT: 27.9 % — ABNORMAL LOW (ref 36.0–46.0)
HEMOGLOBIN: 8.3 g/dL — AB (ref 12.0–15.0)
MCH: 21.6 pg — ABNORMAL LOW (ref 26.0–34.0)
MCHC: 29.7 g/dL — ABNORMAL LOW (ref 30.0–36.0)
MCV: 72.5 fL — ABNORMAL LOW (ref 78.0–100.0)
Platelets: 203 10*3/uL (ref 150–400)
RBC: 3.85 MIL/uL — AB (ref 3.87–5.11)
RDW: 19.4 % — ABNORMAL HIGH (ref 11.5–15.5)
WBC: 7.6 10*3/uL (ref 4.0–10.5)

## 2015-12-15 LAB — BASIC METABOLIC PANEL
ANION GAP: 8 (ref 5–15)
BUN: 23 mg/dL — AB (ref 6–20)
CO2: 26 mmol/L (ref 22–32)
Calcium: 8.7 mg/dL — ABNORMAL LOW (ref 8.9–10.3)
Chloride: 103 mmol/L (ref 101–111)
Creatinine, Ser: 1.69 mg/dL — ABNORMAL HIGH (ref 0.44–1.00)
GFR, EST AFRICAN AMERICAN: 31 mL/min — AB (ref >60)
GFR, EST NON AFRICAN AMERICAN: 27 mL/min — AB (ref >60)
Glucose, Bld: 135 mg/dL — ABNORMAL HIGH (ref 65–99)
POTASSIUM: 3.6 mmol/L (ref 3.5–5.1)
SODIUM: 137 mmol/L (ref 135–145)

## 2015-12-15 LAB — ECHOCARDIOGRAM COMPLETE
HEIGHTINCHES: 64 in
WEIGHTICAEL: 2059.2 [oz_av]

## 2015-12-15 MED ORDER — DILTIAZEM HCL ER COATED BEADS 180 MG PO CP24
300.0000 mg | ORAL_CAPSULE | Freq: Every day | ORAL | Status: DC
  Administered 2015-12-15 – 2015-12-17 (×3): 300 mg via ORAL
  Filled 2015-12-15 (×3): qty 1

## 2015-12-15 NOTE — Care Management Note (Addendum)
Case Management Note  Patient Details  Name: [name] MRN: [id number] Date of Birth: Jul 10, 1929  Subjective/Objective:  Pt presented for hypoxia. Pt is from home alone in an apartment that is right beside her daughters apartment. Pt states she has a cane and rolling walker. PT recommendations for HHPT Services with 24 hr supervision. Per pt she stays at daughters @ times. Daughter provides transportation for pt to MD appointments.                 Action/Plan: CM did get permission to call daughter [name] and CM is awaiting call back to set up home services. Pt stated she will not need a HHRN once stable for d/c. CM will continue to monitor.   Expected Discharge Date:                  Expected Discharge Plan:  [place]  In-House Referral:  NA  Discharge planning Services  CM Consult  Post Acute Care Choice:  Home Health Choice offered to:  Patient, Adult Children  DME Arranged:   N/A DME Agency:  NA  HH Arranged: N/A  HH Agency:   N/A  Status of Service: Completed If discussed at [place] of Stay Meetings, dates discussed:    Additional Comments: [place] 12-16-15 Jacqlyn Krauss, RN, BSN [phone] Pt is refusing [place] at this time. Daughter will be with pt once she is d/c. No further needs from CM at this time.  Bethena Roys, RN 12/15/2015, 10:52 AM

## 2015-12-15 NOTE — Evaluation (Signed)
Occupational Therapy Evaluation Patient Details Name: [name] MRN: [id number] DOB: Aug 22, 1929 Today's Date: 12/15/2015    History of Present Illness 82 yo female with PMH of PAF on Eliquis, SSS s/p PPM (leads 2005, generator change 2014 [employer]. Jude), valvular heart disease with a biological aortic valve prosthesis (21 mm, 2005, Dr. Servando Snare), severe hypercholesterolemia who presented with c/o dyspnea and palpitations for the past couple of weeks   Clinical Impression   Patient presenting with decreased I in self care, balance, functional mobility/transfers, and safety awareness.  Patient reports being mod I PTA. Patient currently functioning at min A level. Patient will benefit from acute OT to increase overall independence in the areas of ADLs, functional mobility, and safety awareness in order to safely discharge home with daughter.     Follow Up Recommendations  Home health OT;Supervision/Assistance - 24 hour    Equipment Recommendations  None recommended by OT       Precautions / Restrictions Precautions Precautions: Fall      Mobility Bed Mobility Overal bed mobility: Needs Assistance Bed Mobility: Supine to Sit;Sit to Supine     Supine to sit: Supervision Sit to supine: Supervision      Transfers Overall transfer level: Needs assistance Equipment used: 1 person hand held assist Transfers: Sit to/from Stand Sit to Stand: Min assist         General transfer comment: steady hand held assist for mobility within room.          ADL Overall ADL's : Needs assistance/impaired            Toilet Transfer: Minimal assistance;BSC   Toileting- Clothing Manipulation and Hygiene: Minimal assistance;Sit to/from stand       Functional mobility during ADLs: Minimal assistance General ADL Comments: Pt standing with supervision from bed and ambulating with hand held assist to [facility] ~ 10' with steady assist for toilet transfer. Pt performed transfer with min  A for safety. Pt stepping over simulated tub ledge as well with min A for balance.Pt returning to bed and doffing B socks before performing sit >supine with close supervision.      Vision Vision Assessment?: No apparent visual deficits          Pertinent Vitals/Pain Pain Assessment: No/denies pain     Hand Dominance Right   Extremity/Trunk Assessment Upper Extremity Assessment Upper Extremity Assessment: Generalized weakness   Lower Extremity Assessment Lower Extremity Assessment: Generalized weakness       Communication Communication Communication: HOH   Cognition Arousal/Alertness: Awake/alert Behavior During Therapy: WFL for tasks assessed/performed Overall Cognitive Status: No family/caregiver present to determine baseline cognitive functioning                                Home Living Family/patient expects to be discharged to:: Private residence Living Arrangements: Alone Available Help at Discharge: Family Type of Home: Apartment Home Access: Level entry     Home Layout: One level     Bathroom Shower/Tub: Other (comment) (sponge bath at sink)   Biochemist, clinical: Standard     Home Equipment: Environmental consultant - 2 wheels;Cane - single point          Prior Functioning/Environment Level of Independence: Independent with assistive device(s)                 OT Problem List: Decreased strength;Decreased activity tolerance;Impaired balance (sitting and/or standing);Decreased safety awareness   OT Treatment/Interventions: Self-care/ADL training;Therapeutic exercise;Energy  conservation;DME and/or AE instruction;Therapeutic activities;Patient/family education;Balance training    OT Goals(Current goals can be found in the care plan section) Acute Rehab OT Goals Patient Stated Goal: to go home OT Goal Formulation: With patient Time For Goal Achievement: 12/29/15 Potential to Achieve Goals: Fair ADL Goals Pt Will Perform Grooming: with supervision Pt  Will Perform Upper Body Bathing: with supervision Pt Will Perform Lower Body Bathing: with supervision Pt Will Perform Upper Body Dressing: with supervision Pt Will Perform Lower Body Dressing: with supervision Pt Will Transfer to Toilet: with supervision Pt Will Perform Toileting - Clothing Manipulation and hygiene: with supervision Pt Will Perform Tub/Shower Transfer: with supervision;Tub transfer  OT Frequency: Min 2X/week   Barriers to D/C:    none known at this time          End of Session    Activity Tolerance: Patient tolerated treatment well Patient left: in bed;with call bell/phone within reach;with bed alarm set   Time: 0370-4888 OT Time Calculation (min): 12 min Charges:  OT General Charges $OT Visit: 1 Procedure OT Evaluation $OT Eval Moderate Complexity: 1 Procedure G-Codes:    Phineas Semen, MS, OTR/L 12/15/2015, 5:19 PM

## 2015-12-15 NOTE — Progress Notes (Signed)
Patient Name: [name] Date of Encounter: 12/15/2015  Primary Cardiologist: Dr. Delila Pereyra Problem List     Active Problems:   Diabetes mellitus type 2, controlled ([place])   Hyperlipidemia associated with type 2 diabetes mellitus ([place])   ANXIETY DEPRESSION   TOBACCO USER   Essential hypertension, benign   CAD (coronary artery disease)   Congestive heart failure (HCC)   Pacemaker   Chronic pain syndrome   S/P AVR (aortic valve replacement)   SSS (sick sinus syndrome) ([place])   CKD stage 3 due to type 2 diabetes mellitus ([place])   Hypoxia     Subjective   Breathing has much improved, states she is feeling better.   Inpatient Medications    Scheduled Meds: . apixaban  2.5 mg Oral BID  . cetaphil   Topical BID  . diltiazem  240 mg Oral Daily  . DULoxetine  30 mg Oral Daily  . ezetimibe  10 mg Oral Daily  . ferrous sulfate  325 mg Oral BID WC  . fluticasone  2 spray Each Nare Daily  . furosemide  40 mg Intravenous Daily  . Influenza vac split quadrivalent PF  0.5 mL Intramuscular Tomorrow-1000  . insulin aspart  0-15 Units Subcutaneous TID WC  . mouth rinse  15 mL Mouth Rinse BID  . metoprolol tartrate  100 mg Oral BID  . pantoprazole  40 mg Oral Daily  . rosuvastatin  20 mg Oral Daily  . sodium chloride flush  3 mL Intravenous Q12H  . sodium chloride flush  3 mL Intravenous Q12H   Continuous Infusions:   PRN Meds: sodium chloride, ALPRAZolam, HYDROcodone-acetaminophen, ondansetron **OR** ondansetron (ZOFRAN) IV, polyethylene glycol, sodium chloride flush   Vital Signs    Vitals:   12/14/15 0700 12/14/15 1356 12/14/15 2100 12/15/15 0500  BP:   118/71 121/74  Pulse:   (!) 113 (!) 112  Resp:   18 17  Temp:  98.1 F (36.7 C) 98.9 F (37.2 C) 98.2 F (36.8 C)  TempSrc:  Oral Oral Oral  SpO2: 98% 94% 98% 95%  Weight:    128 lb 11.2 oz (58.4 kg)  Height:        Intake/Output Summary (Last 24 hours) at 12/15/15 0914 Last data filed at  12/15/15 0720  Gross per 24 hour  Intake              280 ml  Output             1500 ml  Net            -1220 ml   Filed Weights   12/14/15 0558 12/15/15 0500  Weight: 130 lb 1.6 oz (59 kg) 128 lb 11.2 oz (58.4 kg)    Physical Exam   GEN: Well nourished, well developed, in no acute distress.  HEENT: Grossly normal.  Neck: Supple, no JVD, carotid bruits, or masses. Cardiac: Tachy, no murmurs, rubs, or gallops. No clubbing, cyanosis, edema.  Radials/DP/PT 2+ and equal bilaterally.  Respiratory:  Respirations regular and unlabored. Diminished R>L, but improved. GI: Soft, nontender, nondistended, BS + x 4. MS: no deformity or atrophy. Skin: warm and dry, no rash. Neuro:  Strength and sensation are intact. Psych: AAOx3.  Normal affect.  Labs    CBC  Recent Labs  12/14/15 0612 12/15/15 0826  WBC 9.3 7.6  HGB 8.4* 8.3*  HCT 28.2* 27.9*  MCV 72.1* 72.5*  PLT 213 573   Basic Metabolic Panel  Recent  Labs  12/13/15 2050 12/14/15 0612  NA 137 139  K 3.9 3.6  CL 106 104  CO2 22 26  GLUCOSE 171* 108*  BUN 18 16  CREATININE 1.24* 1.25*  CALCIUM 9.1 9.2   Liver Function Tests No results for input(s): AST, ALT, ALKPHOS, BILITOT, PROT, ALBUMIN in the last 72 hours. No results for input(s): LIPASE, AMYLASE in the last 72 hours. Cardiac Enzymes  Recent Labs  12/14/15 0846 12/14/15 1339 12/14/15 2016  TROPONINI <0.03 <0.03 <0.03   BNP Invalid input(s): POCBNP D-Dimer  Recent Labs  12/13/15 2147  DDIMER 1.37*   Hemoglobin A1C No results for input(s): HGBA1C in the last 72 hours. Fasting Lipid Panel No results for input(s): CHOL, HDL, LDLCALC, TRIG, CHOLHDL, LDLDIRECT in the last 72 hours. Thyroid Function Tests  Recent Labs  12/14/15 1339  TSH 2.322    Telemetry    Atrial Flutter - Personally Reviewed  ECG    See previous note - Personally Reviewed  Radiology    Ct Angio Chest Pe W Or Wo Contrast  Result Date: 12/14/2015 CLINICAL DATA:   Heart palpitations and dyspnea. EXAM: CT ANGIOGRAPHY CHEST WITH CONTRAST TECHNIQUE: Multidetector CT imaging of the chest was performed using the standard protocol during bolus administration of intravenous contrast. Multiplanar CT image reconstructions and MIPs were obtained to evaluate the vascular anatomy. CONTRAST:  75 mL of Isovue 370 administered IV COMPARISON:  11/13/2009 chest CT, chest radiograph from 12/13/2015 FINDINGS: Cardiovascular: The study is of quality for the evaluation of pulmonary embolism. However due to respiratory motion artifacts, there is slight image degradation. There are no filling defects in the central, lobar, segmental or subsegmental pulmonary artery branches to suggest acute pulmonary embolism. Atherosclerosis of the aortic arch. Normal configuration of great vessels also containing atherosclerotic calcifications. Borderline cardiomegaly. No significant pericardial fluid/thickening. Coronary arteriosclerosis. Mild pectus deformity of the anterior chest wall impressing upon the right heart border. Aortic valvular replacement with median sternotomy sutures noted. Mediastinum/Nodes: No discrete thyroid nodules. Mild air-filled appearance of the esophagus. No pathologically enlarged axillary, mediastinal or hilar lymph nodes. Lungs/Pleura: No pneumothorax. Moderate to large right and small to moderate left pleural effusions with compressive atelectasis. Upper abdomen: Some reflux of contrast into the hepatic veins. Musculoskeletal:  No acute osseous abnormality. IMPRESSION: Bilateral pleural effusions with compressive atelectasis. No pulmonary embolus. Coronary arteriosclerosis.  Aortic atherosclerosis. Electronically Signed   By: Ashley Royalty M.D.   On: 12/14/2015 00:54   Dg Chest Portable 1 View  Result Date: 12/13/2015 CLINICAL DATA:  Subacute onset of palpitations and shortness of breath. Generalized chest pain. Initial encounter. EXAM: PORTABLE CHEST 1 VIEW COMPARISON:  Chest  radiograph performed 05/04/2014 FINDINGS: The lungs are well-aerated. Small to moderate right and small left pleural effusions are noted, with bibasilar airspace opacities, possibly reflecting mild pulmonary edema. There is no evidence of pneumothorax. The cardiomediastinal silhouette is enlarged. The patient is status post median sternotomy. A valve replacement is noted. A pacemaker is noted overlying the right chest wall, with leads ending overlying the right atrium and right ventricle. No acute osseous abnormalities are seen. IMPRESSION: Small to moderate right and small left pleural effusions, with bibasilar airspace opacities, possibly reflecting mild pulmonary edema. Cardiomegaly noted. Electronically Signed   By: Garald Balding M.D.   On: 12/13/2015 22:12    Cardiac Studies   Echo pending.  Patient Profile     82 yo female with PMH of PAF on Eliquis, SSS s/p PPM (leads 2005, generator change 2014  [employer]), valvular heart disease with a biological aortic valve prosthesis (21 mm, 2005, Dr. Servando Snare), severe hypercholesterolemia who presented with c/o dyspnea and palpitations for the past couple of weeks. Found to be in atrial flutter with bilateral pleural effusions.   Assessment & Plan    1. Tachycardia: Admission EKG is difficulty to identify p waves. Question 2:1 atrial flutter on telemetry? Has a known Hx of PAF. Last remote transmission in 5/17 noted <1% AF. Home medications include Diltiazem and atenolol, along with Eliquis 2.5mg . Atenolol was changed to metoprolol this admission via primary team. -- Rates around 100-120, increased diltiazem to 240mg  yesterday, will further increase to 300mg  today -- Continue Eliquis -- This patients CHA2DS2-VASc Score and unadjusted Ischemic Stroke Rate (% per year) is equal to 7.2 % stroke rate/year from a score of 5 Above score calculated as 1 point each if present [CHF, HTN, DM, Vascular=MI/PAD/Aortic Plaque, Age if 65-74, or Female] Above score  calculated as 2 points each if present [Age > 75, or Stroke/TIA/TE]  2. Acute Respiratory Failure: Increasing dyspnea over the past couple of weeks, also noted palpitations. CXR with bilateral pleural effusions. BNP 936. Question this is 2/2 the above stated. Currently on IV lasix with good UOP thus far and stable Cr. Breathing has improved today.   -- echo pending.  3. AVR: Stable per last echo in 2014. Reports some occasional dizziness at times. No syncope  4. Anemia: Hgb 9.1>>8.4. Denies any active bleeding. On home Iron BID. Monitor CBC, question if this could be contributing to her tachycardia  5. HTN: Bp improved.   Signed, Reino Bellis, NP  12/15/2015, 9:14 AM   I have examined the patient and reviewed assessment and plan and discussed with patient.  Agree with above as stated.  Increase diltiazem for atrial flutter with HR > 100.  Continue Eliquis.  Cannot cardiovert at this time since Eliquis was held for a short time, but could reconsider cardioversion in 3 weeks if her high HR persists.  Breathing is better.  She would like to go home soon.  F/u with Dr. Sallyanne Kuster.  Larae Grooms

## 2015-12-15 NOTE — Progress Notes (Signed)
Family Medicine Teaching Service Daily Progress Note Intern Pager: [phone]  Patient name: [name] Medical record number: [id number] Date of birth: [birth year] Age: 82 y.o. Gender: female  Primary Care Provider: Lavon Paganini, MD Consultants: None Code Status: DNR   Pt Overview and Major Events to Date:    Assessment and Plan: [name] is a 82 y.o. female presenting with dyspnea and palpitations . PMH is significant for CAD with a pacemaker, paroxysmal A. fib with chronic anticoagulation, status post aortic valve replacement, CHF, CKD stage III, hearing loss, restless leg syndrome, DM, HTN, anxiety, depression, anemia, GERD, actinic keratosis, bilateral knee arthritis, chronic dry skin.   Acute Respiratory Failure: Palpitations 1 week with increased dyspnea and generalized weakness for the last couple days. In ED patient was tachycardic and hypoxic to 84% O2, she was placed on a Ventimask. Vital signs on admission: Afebrile, tachycardic to 115 with blood pressure to 170/104. RR 20 but is satting in the high 90's on 3 L nasal cannula. Physical exam notable for tachycardia, tachypnea, and bilateral crackles heard best in lower lung fields, no edema. Labs remarkable for BNP of 936.4. Creatinine 1.24. WBC 11.1. Hemoglobin 9.1. D-dimer 1.37, CTA negative for PE although positive for moderate to large right and small to moderate left pleural effusions with compressive atelectasis. which are also seen on chest x-ray. Likely acute CHF 2/2 PAF.  Last echocardiogram in March 2014 showed normal bioprosthetic function and normal LVEF 55-60 %. Echo done today--f/u read.   I/O: 280/1450.  Weight down 2 pounds.   Turned off O2 and patient satted well during our visit. Seen by PT recommending HHPT and 24 hr supervision.  -Continuous cardiac monitoring -Continuous pulse ox -Supplemental oxygen with goal oxygen above 92% -Will diurese with 40 mg IV Lasix twice a day -Strict I's and  O's -Daily weights -Echocardiogram -Repeat chest x-ray in 24 hours for repeat stat if worsening respiratory status -Consider consult for pleural effusion drainage if patient does not improve with diuresis or pleural effusions worsen  Cardiac:  Patient has extensive cardiac history including paroxysmal atrial fibrillation on chronic anticoagulation (Eliquis), sinus node dysfunction s/p dual chamber pacemaker (leads 2005, generator change 2014 [employer]), valvular heart disease with a biological aortic valve prosthesis (2005, [name]), hypertension, CHF, and severe hypercholesterolemia. Cardiac catheterization in 2005 showed 40% LAD stenosis. EKG on admission showing sinus tachycardia and an old Inferior infarct, appears to be in sinus rhythm, no ST changes. Troponin 0.02 in ED.  Started back on eliquis and increased diltiazem to 240mg  per cardiology.  Patient still tachycardic this morning.  -cardiology consulted appreciate recs -continuous cardiac monitoring as above -f/u echo Continue home Eliquis -Continue diltiazem 240 mg daily, metoprolol 100 mg daily.  -crestor 20mg  daily  CKD stage III: Baseline creatinine appears to be 1.24-1.27. Patient at baseline at 1.24 today -Avoid nephrotoxic agents -Daily BMP -Hold home Benzapril  Diabetes: Controlled, last A1c in 09/02/2015 was 5.7. Home medications include metformin 500 mg twice a day next -hold home medication -Sliding scale insulin while inpatient -CBGs  GERD: Home medications include Prilosec 20 mg daily -Pantoprazole 40 mg daily while inpatient  Microcytic Anemia: Likely iron deficiency anemia. Hemoglobin of 9.1 on admission. Baseline appears to be 8.3-9.1. Iron studies 3 months ago showing low iron at 29. Endorsing some PICA symptoms for crushed ice.  Hgb 8.3 this morning down from 8.4 yesterday.  - Daily CBC - continue home iron   Chronic dry skin:  -Lotion twice  a day  Chronic pain: Home medications include Norco  7.5-325 mg tablet twice a day when necessary and Cymbalta 30 mg daily -Resume home medications  Anxiety and depression: Home medications include Xanax 1 mg twice a day when necessary for anxiety and Cymbalta 30 mg daily -Resume home medications   FEN/GI: Saline lock IV, heart healthy diet  Prophylaxis: Lovenox   Disposition: discharge home pending medical improvement  Subjective:  Patient feels well this morning.  No CP, SOB, NVD or palpitations.   Objective: Temp:  [98.1 F (36.7 C)-98.9 F (37.2 C)] 98.2 F (36.8 C) (10/18 0500) Pulse Rate:  [112-113] 112 (10/18 0500) Resp:  [17-18] 17 (10/18 0500) BP: (118-121)/(71-74) 121/74 (10/18 0500) SpO2:  [94 %-98 %] 95 % (10/18 0500) Weight:  [128 lb 11.2 oz (58.4 kg)] 128 lb 11.2 oz (58.4 kg) (10/18 0500) Physical Exam: General: Elderly female sitting up in bed in NAD Eyes:  EOMI ENTM:  MMM Neck:  No lymphadenopathy Cardiovascular:  Tachycardic, regular rhythm Respiratory:  On 2 L nasal cannula, normal WOB, mild crackles bilateral bases Gastrointestinal: soft, NTND Extremities: warm and well perfused, no cyanosis, 1+ pitting edema to shins MSK:  moves all extremities Neuro:  Alert and oriented 3 Psych:  Normal mood and affect  Laboratory:  Recent Labs Lab 12/10/15 1530 12/13/15 2050 12/14/15 0612  WBC 10.0 11.1* 9.3  HGB 8.7* 9.1* 8.4*  HCT 30.3* 30.9* 28.2*  PLT 207 259 213    Recent Labs Lab 12/10/15 1530 12/13/15 2050 12/14/15 0612  NA 138 137 139  K 4.2 3.9 3.6  CL 108 106 104  CO2 23 22 26   BUN 18 18 16   CREATININE 1.27* 1.24* 1.25*  CALCIUM 9.2 9.1 9.2  GLUCOSE 105* 171* 108*    Imaging/Diagnostic Tests: Ct Angio Chest Pe W Or Wo Contrast  Result Date: 12/14/2015 CLINICAL DATA:  Heart palpitations and dyspnea. EXAM: CT ANGIOGRAPHY CHEST WITH CONTRAST TECHNIQUE: Multidetector CT imaging of the chest was performed using the standard protocol during bolus administration of intravenous contrast.  Multiplanar CT image reconstructions and MIPs were obtained to evaluate the vascular anatomy. CONTRAST:  75 mL of Isovue 370 administered IV COMPARISON:  11/13/2009 chest CT, chest radiograph from 12/13/2015 FINDINGS: Cardiovascular: The study is of quality for the evaluation of pulmonary embolism. However due to respiratory motion artifacts, there is slight image degradation. There are no filling defects in the central, lobar, segmental or subsegmental pulmonary artery branches to suggest acute pulmonary embolism. Atherosclerosis of the aortic arch. Normal configuration of great vessels also containing atherosclerotic calcifications. Borderline cardiomegaly. No significant pericardial fluid/thickening. Coronary arteriosclerosis. Mild pectus deformity of the anterior chest wall impressing upon the right heart border. Aortic valvular replacement with median sternotomy sutures noted. Mediastinum/Nodes: No discrete thyroid nodules. Mild air-filled appearance of the esophagus. No pathologically enlarged axillary, mediastinal or hilar lymph nodes. Lungs/Pleura: No pneumothorax. Moderate to large right and small to moderate left pleural effusions with compressive atelectasis. Upper abdomen: Some reflux of contrast into the hepatic veins. Musculoskeletal:  No acute osseous abnormality. IMPRESSION: Bilateral pleural effusions with compressive atelectasis. No pulmonary embolus. Coronary arteriosclerosis.  Aortic atherosclerosis. Electronically Signed   By: Ashley Royalty M.D.   On: 12/14/2015 00:54   Dg Chest Portable 1 View  Result Date: 12/13/2015 CLINICAL DATA:  Subacute onset of palpitations and shortness of breath. Generalized chest pain. Initial encounter. EXAM: PORTABLE CHEST 1 VIEW COMPARISON:  Chest radiograph performed 05/04/2014 FINDINGS: The lungs are well-aerated. Small to moderate right  and small left pleural effusions are noted, with bibasilar airspace opacities, possibly reflecting mild pulmonary edema.  There is no evidence of pneumothorax. The cardiomediastinal silhouette is enlarged. The patient is status post median sternotomy. A valve replacement is noted. A pacemaker is noted overlying the right chest wall, with leads ending overlying the right atrium and right ventricle. No acute osseous abnormalities are seen. IMPRESSION: Small to moderate right and small left pleural effusions, with bibasilar airspace opacities, possibly reflecting mild pulmonary edema. Cardiomegaly noted. Electronically Signed   By: Garald Balding M.D.   On: 12/13/2015 22:12   Eloise Levels, MD 12/15/2015, 7:00 AM PGY-1, [place] Intern pager: [phone], text pages welcome

## 2015-12-15 NOTE — Progress Notes (Signed)
  Echocardiogram 2D Echocardiogram has been performed.  [name] 12/15/2015, 3:31 PM

## 2015-12-15 NOTE — Plan of Care (Signed)
Problem: Activity: Goal: Risk for activity intolerance will decrease Outcome: Progressing Patient to and from the bedside commode, no shortness of breath noted.

## 2015-12-16 ENCOUNTER — Ambulatory Visit: Payer: Medicare Other | Admitting: Physician Assistant

## 2015-12-16 DIAGNOSIS — E785 Hyperlipidemia, unspecified: Secondary | ICD-10-CM

## 2015-12-16 DIAGNOSIS — E1169 Type 2 diabetes mellitus with other specified complication: Secondary | ICD-10-CM

## 2015-12-16 DIAGNOSIS — J90 Pleural effusion, not elsewhere classified: Secondary | ICD-10-CM

## 2015-12-16 DIAGNOSIS — I5031 Acute diastolic (congestive) heart failure: Secondary | ICD-10-CM

## 2015-12-16 LAB — CBC
HEMATOCRIT: 27.2 % — AB (ref 36.0–46.0)
Hemoglobin: 7.9 g/dL — ABNORMAL LOW (ref 12.0–15.0)
MCH: 20.7 pg — ABNORMAL LOW (ref 26.0–34.0)
MCHC: 29 g/dL — AB (ref 30.0–36.0)
MCV: 71.4 fL — ABNORMAL LOW (ref 78.0–100.0)
PLATELETS: 228 10*3/uL (ref 150–400)
RBC: 3.81 MIL/uL — ABNORMAL LOW (ref 3.87–5.11)
RDW: 19.5 % — AB (ref 11.5–15.5)
WBC: 9.9 10*3/uL (ref 4.0–10.5)

## 2015-12-16 LAB — GLUCOSE, CAPILLARY
GLUCOSE-CAPILLARY: 119 mg/dL — AB (ref 65–99)
GLUCOSE-CAPILLARY: 149 mg/dL — AB (ref 65–99)
Glucose-Capillary: 132 mg/dL — ABNORMAL HIGH (ref 65–99)

## 2015-12-16 LAB — BASIC METABOLIC PANEL
ANION GAP: 8 (ref 5–15)
BUN: 24 mg/dL — ABNORMAL HIGH (ref 6–20)
CALCIUM: 9 mg/dL (ref 8.9–10.3)
CO2: 29 mmol/L (ref 22–32)
CREATININE: 1.56 mg/dL — AB (ref 0.44–1.00)
Chloride: 101 mmol/L (ref 101–111)
GFR, EST AFRICAN AMERICAN: 34 mL/min — AB (ref >60)
GFR, EST NON AFRICAN AMERICAN: 30 mL/min — AB (ref >60)
Glucose, Bld: 139 mg/dL — ABNORMAL HIGH (ref 65–99)
Potassium: 3.6 mmol/L (ref 3.5–5.1)
SODIUM: 138 mmol/L (ref 135–145)

## 2015-12-16 LAB — HEMOGLOBIN AND HEMATOCRIT, BLOOD
HEMATOCRIT: 32.2 % — AB (ref 36.0–46.0)
HEMOGLOBIN: 9.7 g/dL — AB (ref 12.0–15.0)

## 2015-12-16 LAB — ABO/RH: ABO/RH(D): A NEG

## 2015-12-16 LAB — PREPARE RBC (CROSSMATCH)

## 2015-12-16 MED ORDER — METOPROLOL TARTRATE 100 MG PO TABS: 100.0000 mg | ORAL_TABLET | Freq: Two times a day (BID) | ORAL | 0 refills | Status: DC

## 2015-12-16 MED ORDER — ALPRAZOLAM 1 MG PO TABS: 0.5000 mg | ORAL_TABLET | Freq: Two times a day (BID) | ORAL | 0 refills | Status: DC | PRN

## 2015-12-16 MED ORDER — SODIUM CHLORIDE 0.9 % IV SOLN: Freq: Once | INTRAVENOUS | Status: DC

## 2015-12-16 MED ORDER — DILTIAZEM HCL ER COATED BEADS 300 MG PO CP24: 300.0000 mg | ORAL_CAPSULE | Freq: Every day | ORAL | 0 refills | Status: DC

## 2015-12-16 MED ORDER — INFLUENZA VAC SPLIT QUAD 0.5 ML IM SUSY
0.5000 mL | PREFILLED_SYRINGE | INTRAMUSCULAR | Status: AC | PRN
  Administered 2015-12-17: 0.5 mL via INTRAMUSCULAR
  Filled 2015-12-16: qty 0.5

## 2015-12-16 NOTE — Consult Note (Signed)
[facility] CM Primary Care Navigator  12/16/2015  [name] 09-23-1929 [id number]  Met with patient at the bedside to identify possible discharge needs. Patient shares having "fast heart beat and shortness of breath with walking" which had led to this admission. Patient confirms Dr. Lavon Paganini with [facility] as her primary care provider.    Patient shared using [facility] at [facility] to obtain medications with no difficulty since being mostly covered by Medicare/ Medicaid. Patient verbalized managing her medicines at home with daughter's assistance and uses "pill box" system.   Her daughter [name]) whom she lives with, provides transportation to her doctors' appointments and she is the primary caregiver at home as well.  Patient voiced understanding to call primary care provider's office for a post discharge follow-up appointment within a week or sooner if needs arise.   Patient letter provided as a reminder.  PT recommendation is for [facility] with 24 hr supervision.  In patient CM is awaiting a call back from patient's daughter to set up home health services as stated.  Patient denies further needs or concerns at this time.   For additional questions please contact:  Edwena Felty A. Grafton Warzecha, BSN, RN-BC [facility] PRIMARY CARE Navigator Cell: [phone]

## 2015-12-16 NOTE — Progress Notes (Signed)
Family Medicine Teaching Service Daily Progress Note Intern Pager: [phone]  Patient name: [name] Medical record number: [id number] Date of birth: 1929-11-26 Age: 82 y.o. Gender: female  Primary Care Provider: Lavon Paganini, MD Consultants: None Code Status: DNR   Pt Overview and Major Events to Date:   Assessment and Plan: [name] is a 82 y.o. female presenting with dyspnea and palpitations . PMH is significant for CAD with a pacemaker, paroxysmal A. fib with chronic anticoagulation, status post aortic valve replacement, CHF, CKD stage III, hearing loss, restless leg syndrome, DM, HTN, anxiety, depression, anemia, GERD, actinic keratosis, bilateral knee arthritis, chronic dry skin.   Acute Respiratory Failure: Palpitations 1 week with increased dyspnea and generalized weakness for the last couple days.  Likely acute CHF 2/2 PAF.  Last echocardiogram in March 2014 showed normal bioprosthetic function and normal LVEF 55-60 %.  Echo 10/18 unchanged with EF 55-60%.  Weight down 2 pounds.   Satting well on RA.  Seen by PT recommending HHPT and 24 hr supervision.  -Continuous cardiac monitoring -Continuous pulse ox -Will diurese with 40 mg IV Lasix a day  -Strict I's and O's -Daily weights -Repeat chest x-ray in 24 hours for repeat stat if worsening respiratory status -Consider consult for pleural effusion drainage if patient does not improve with diuresis or pleural effusions worsen  Cardiac:  Patient has extensive cardiac history including paroxysmal atrial fibrillation on chronic anticoagulation (Eliquis), sinus node dysfunction s/p dual chamber pacemaker (leads 2005, generator change 2014 [employer]), valvular heart disease with a biological aortic valve prosthesis (2005, Dr. Servando Snare), hypertension, CHF, and severe hypercholesterolemia. Cardiac catheterization in 2005 showed 40% LAD stenosis. Started back on eliquis and increased diltiazem to 300 mg per cardiology  recs.  Patient still tachycardic this morning. Per cards, Continue Eliquis,  Cannot cardiovert at this time since Eliquis was held for a short time but could reconsider cardioversion in 3 weeks if her tachycardia persists.  Appreciate recs.  -Continue metoprolol 100 mg bid  -crestor 20mg  daily  CKD stage III: Baseline creatinine appears to be 1.24-1.27. Patient at baseline at 1.69 today -Avoid nephrotoxic agents -Daily BMP -Hold home Benzapril  Diabetes: Controlled, last A1c in 09/02/2015 was 5.7. Home medications include metformin 500 mg twice a day next -hold home medication -Sliding scale insulin while inpatient -CBGs  GERD: Home medications include Prilosec 20 mg daily -Pantoprazole 40 mg daily while inpatient  Microcytic Anemia: Likely iron deficiency anemia. Hemoglobin of 9.1 on admission. Baseline appears to be 8.3-9.1. Iron studies 3 months ago showing low iron at 29. Endorsing some PICA symptoms for crushed ice.  Hgb 7.9 this morning down from 8.3 yesterday.  Denies any active bleeding.  -Transfusion threshold 8 given cardiac hx.  Transfusing 1 unit. -Post transfusion H&H - continue home iron  Chronic dry skin:  -Lotion twice a day  Chronic pain: Home medications include Norco 7.5-325 mg tablet twice a day when necessary and Cymbalta 30 mg daily -Resume home medications  Anxiety and depression: Home medications include Xanax 1 mg twice a day when necessary for anxiety and Cymbalta 30 mg daily -Resume home medications  FEN/GI: Saline lock IV, heart healthy diet  Prophylaxis: Lovenox   Disposition: discharge home pending Hgb stable after transfusion  Subjective:  Patient feels well this morning.  No CP, SOB, NVD or palpitations.   Objective: Temp:  [97.6 F (36.4 C)-98.4 F (36.9 C)] 97.6 F (36.4 C) (10/19 0501) Pulse Rate:  [86-115] 112 (10/19 0501)  Resp:  [15-24] 22 (10/19 0501) BP: (112-133)/(61-73) 133/70 (10/19 0501) SpO2:  [93 %-98 %] 96 % (10/19  0501) Weight:  [128 lb 4.8 oz (58.2 kg)] 128 lb 4.8 oz (58.2 kg) (10/19 0501) Physical Exam: General: Elderly female sitting up in bed in NAD Eyes:  EOMI ENTM:  MMM Neck:  No lymphadenopathy Cardiovascular:  Tachycardic, regular rhythm Respiratory:  On 2 L nasal cannula, normal WOB, mild crackles bilateral bases Gastrointestinal: soft, NTND Extremities: warm and well perfused, no cyanosis, 1+ pitting edema to shins MSK:  moves all extremities Neuro:  Alert and oriented 3 Psych:  Normal mood and affect  Laboratory:  Recent Labs Lab 12/14/15 0612 12/15/15 0826 12/16/15 0400  WBC 9.3 7.6 9.9  HGB 8.4* 8.3* 7.9*  HCT 28.2* 27.9* 27.2*  PLT 213 203 228    Recent Labs Lab 12/13/15 2050 12/14/15 0612 12/15/15 1152  NA 137 139 137  K 3.9 3.6 3.6  CL 106 104 103  CO2 22 26 26   BUN 18 16 23*  CREATININE 1.24* 1.25* 1.69*  CALCIUM 9.1 9.2 8.7*  GLUCOSE 171* 108* 135*   Imaging/Diagnostic Tests: No results found. Lovenia Kim, MD 12/16/2015, 6:50 AM PGY-1, [place] Intern pager: [phone], text pages welcome

## 2015-12-16 NOTE — Progress Notes (Signed)
PT Cancellation Note  Patient Details Name: BRODI NERY MRN: [id number] DOB: [birth year]   Cancelled Treatment:    Reason Eval/Treat Not Completed: Medical issues which prohibited therapy (awaiting a transfusion and declined therapy).  Will try again tomorrow.   Ramond Dial 12/16/2015, 1:32 PM   Mee Hives, PT MS Acute Rehab Dept. Number: [place] and [place]

## 2015-12-16 NOTE — Progress Notes (Signed)
Patient Name: [name] Date of Encounter: 12/16/2015  Primary Cardiologist: Dr. Delila Pereyra Problem List     Active Problems:   Diabetes mellitus type 2, controlled ([place])   Hyperlipidemia associated with type 2 diabetes mellitus ([place])   ANXIETY DEPRESSION   TOBACCO USER   Essential hypertension, benign   CAD (coronary artery disease)   Congestive heart failure (HCC)   Pacemaker   Chronic pain syndrome   S/P AVR (aortic valve replacement)   SSS (sick sinus syndrome) (HCC)   CKD stage 3 due to type 2 diabetes mellitus (HCC)   Hypoxia   Acute congestive heart failure (HCC)   Atrial fibrillation with RVR (HCC)     Subjective   Breathing ok. Wants to go home. Walked yesterday and felt well.   Inpatient Medications    Scheduled Meds: . apixaban  2.5 mg Oral BID  . cetaphil   Topical BID  . diltiazem  300 mg Oral Daily  . DULoxetine  30 mg Oral Daily  . ezetimibe  10 mg Oral Daily  . ferrous sulfate  325 mg Oral BID WC  . fluticasone  2 spray Each Nare Daily  . furosemide  40 mg Intravenous Daily  . Influenza vac split quadrivalent PF  0.5 mL Intramuscular Tomorrow-1000  . insulin aspart  0-15 Units Subcutaneous TID WC  . mouth rinse  15 mL Mouth Rinse BID  . metoprolol tartrate  100 mg Oral BID  . pantoprazole  40 mg Oral Daily  . rosuvastatin  20 mg Oral Daily  . sodium chloride flush  3 mL Intravenous Q12H  . sodium chloride flush  3 mL Intravenous Q12H   Continuous Infusions:   PRN Meds: sodium chloride, ALPRAZolam, HYDROcodone-acetaminophen, ondansetron **OR** ondansetron (ZOFRAN) IV, polyethylene glycol, sodium chloride flush   Vital Signs    Vitals:   12/15/15 1536 12/15/15 2123 12/16/15 0501 12/16/15 0758  BP: 112/61 125/73 133/70 (!) 147/58  Pulse: 86 (!) 115 (!) 112 (!) 111  Resp: 15 (!) 24 (!) 22 18  Temp: 98.4 F (36.9 C) 98.2 F (36.8 C) 97.6 F (36.4 C) 98.3 F (36.8 C)  TempSrc: Oral Oral Oral Axillary  SpO2: 95% 93% 96%  98%  Weight:   128 lb 4.8 oz (58.2 kg)   Height:        Intake/Output Summary (Last 24 hours) at 12/16/15 0917 Last data filed at 12/16/15 0858  Gross per 24 hour  Intake              600 ml  Output             1050 ml  Net             -450 ml   Filed Weights   12/14/15 0558 12/15/15 0500 12/16/15 0501  Weight: 130 lb 1.6 oz (59 kg) 128 lb 11.2 oz (58.4 kg) 128 lb 4.8 oz (58.2 kg)    Physical Exam   GEN: Well nourished, well developed, in no acute distress.  HEENT: Grossly normal.  Neck: Supple, no JVD, carotid bruits, or masses. Cardiac: Tachy, no murmurs, rubs, or gallops. No clubbing, cyanosis, edema.  Radials/DP/PT 2+ and equal bilaterally.  Respiratory:  Respirations regular and unlabored. Diminished R>L, but improved. GI: Soft, nontender, nondistended, BS + x 4. MS: no deformity or atrophy. Skin: warm and dry, no rash. Neuro:  Strength and sensation are intact. Psych: AAOx3.  Normal affect.  Labs    CBC  Recent Labs  12/15/15 0826 12/16/15 0400  WBC 7.6 9.9  HGB 8.3* 7.9*  HCT 27.9* 27.2*  MCV 72.5* 71.4*  PLT 203 704   Basic Metabolic Panel  Recent Labs  12/14/15 0612 12/15/15 1152  NA 139 137  K 3.6 3.6  CL 104 103  CO2 26 26  GLUCOSE 108* 135*  BUN 16 23*  CREATININE 1.25* 1.69*  CALCIUM 9.2 8.7*   Liver Function Tests No results for input(s): AST, ALT, ALKPHOS, BILITOT, PROT, ALBUMIN in the last 72 hours. No results for input(s): LIPASE, AMYLASE in the last 72 hours. Cardiac Enzymes  Recent Labs  12/14/15 0846 12/14/15 1339 12/14/15 2016  TROPONINI <0.03 <0.03 <0.03   BNP Invalid input(s): POCBNP D-Dimer  Recent Labs  12/13/15 2147  DDIMER 1.37*   Hemoglobin A1C No results for input(s): HGBA1C in the last 72 hours. Fasting Lipid Panel No results for input(s): CHOL, HDL, LDLCALC, TRIG, CHOLHDL, LDLDIRECT in the last 72 hours. Thyroid Function Tests  Recent Labs  12/14/15 1339  TSH 2.322    Telemetry    Atrial  Flutter - Personally Reviewed  ECG    See previous note - Personally Reviewed  Radiology    No results found.  Cardiac Studies   TTE:10/18  ------------------------------------------------------------------- Study Conclusions  - Left ventricle: The cavity size was normal. There was mild   concentric hypertrophy. Systolic function was normal. The   estimated ejection fraction was in the range of 55% to 60%. The   study is not technically sufficient to allow evaluation of LV   diastolic function. - Aortic valve: Bioprosthetic AVR with trivial central AR no   periprosthetic regurgitation and no change in   gradients since previous echo Valve area (VTI): 0.68 cm^2. Valve   area (Vmax): 0.62 cm^2. Valve area (Vmean): 0.59 cm^2. - Mitral valve: There was mild regurgitation. - Left atrium: The atrium was mildly dilated. - Atrial septum: No defect or patent foramen ovale was identified. - Pulmonary arteries: PA peak pressure: 34 mm Hg (S). - Line: A venous catheter was visualized in the superior vena cava,   with its tip in the right atrium. No abnormal features noted.  Patient Profile     82 yo female with PMH of PAF on Eliquis, SSS s/p PPM (leads 2005, generator change 2014 [employer]), valvular heart disease with a biological aortic valve prosthesis (21 mm, 2005, Dr. Servando Snare), severe hypercholesterolemia who presented with c/o dyspnea and palpitations for the past couple of weeks. Found to be in atrial flutter with bilateral pleural effusions.   Assessment & Plan    1. Tachycardia: Admission EKG is difficulty to identify p waves. Question 2:1 atrial flutter on telemetry? Has a known Hx of PAF. Last remote transmission in 5/17 noted <1% AF. Home medications include Diltiazem and atenolol, along with Eliquis 2.5mg . Atenolol was changed to metoprolol this admission via primary team. -- Rates around 110, increased diltiazem to 300mg  yesterday, but she is asymptomatic at this time. Can  increase further today, may need to adjust her BB. -- Continue Eliquis, can not cardiovert as her Eliquis was held briefly this admission.  -- This patients CHA2DS2-VASc Score and unadjusted Ischemic Stroke Rate (% per year) is equal to 7.2 % stroke rate/year from a score of 5 Above score calculated as 1 point each if present [CHF, HTN, DM, Vascular=MI/PAD/Aortic Plaque, Age if 65-74, or Female] Above score calculated as 2 points each if present [Age > 75, or Stroke/TIA/TE]  2. Acute Respiratory Failure:  Increasing dyspnea over the past couple of weeks, also noted palpitations. CXR with bilateral pleural effusions. BNP 936. Question this is 2/2 the above stated. Currently on IV lasix with good UOP 1.9L.  -- Cr has started to increase today, will hold IV dose this morning. May need home lasix at discharge.  Breathing has improved today.   -- echo showed normal EF, stable AVR.  3. AVR: Stable on echo this admission.   4. Anemia: Hgb 9.1>>8.4>>7.9. Denies any active bleeding. On home Iron BID. Monitor CBC, question if this could be contributing to her tachycardia?  5. HTN: Bp improved.   Signed, Reino Bellis, NP-C  12/16/2015, 9:17 AM   I have examined the patient and reviewed assessment and plan and discussed with patient.  Agree with above as stated.  Continue current dose of meds.  HR decreases during sleep.  After a few more weeks of uninterrupted Eliquis, could consider cardioversion.  She will need Lasix to prevent reaccumulation of her pleural effusions.  OK to discharge from a cardiac standpoint.  Workup of anemia in progress.    Larae Grooms

## 2015-12-17 ENCOUNTER — Ambulatory Visit: Payer: Medicare Other | Admitting: Family Medicine

## 2015-12-17 DIAGNOSIS — N183 Chronic kidney disease, stage 3 (moderate): Secondary | ICD-10-CM

## 2015-12-17 DIAGNOSIS — Z23 Encounter for immunization: Secondary | ICD-10-CM | POA: Diagnosis not present

## 2015-12-17 DIAGNOSIS — E1122 Type 2 diabetes mellitus with diabetic chronic kidney disease: Secondary | ICD-10-CM

## 2015-12-17 LAB — TYPE AND SCREEN
ABO/RH(D): A NEG
ANTIBODY SCREEN: NEGATIVE
UNIT DIVISION: 0

## 2015-12-17 LAB — GLUCOSE, CAPILLARY: Glucose-Capillary: 143 mg/dL — ABNORMAL HIGH (ref 65–99)

## 2015-12-17 MED ORDER — FUROSEMIDE 40 MG PO TABS: 40.0000 mg | ORAL_TABLET | Freq: Two times a day (BID) | ORAL | Status: DC | PRN

## 2015-12-17 NOTE — Progress Notes (Signed)
Physical Therapy Treatment Patient Details Name: [name] MRN: [id number] DOB: 1929-05-18 Today's Date: 12/17/2015    History of Present Illness 82 yo female with PMH of PAF on Eliquis, SSS s/p PPM (leads 2005, generator change 2014 [employer] Jude), valvular heart disease with a biological aortic valve prosthesis (21 mm, 2005, Dr. Servando Snare), severe hypercholesterolemia who presented with c/o dyspnea and palpitations for the past couple of weeks. She had low demoglobin and received transfsion 12/16/15.    PT Comments    Pt using snuff upon entering room.  Her granddaughter was present during session. Pt wished to have discussion about home equipment and DME to be shared with her daughter prior to final decisions being made.  Discussed progress with MD at end of session with pt present. She may need new RW as her current walker may be in need of some repair (per pt).   Follow Up Recommendations  Home health PT;Supervision/Assistance - 24 hour     Equipment Recommendations  Rolling walker with 5" wheels (my current walker is a piece)    Recommendations for Other Services       Precautions / Restrictions Precautions Precautions: Fall Precaution Comments: HOH Restrictions Weight Bearing Restrictions: No    Mobility  Bed Mobility Overal bed mobility: Independent Bed Mobility: Supine to Sit;Sit to Supine              Transfers Overall transfer level: Independent Equipment used: None Transfers: Sit to/from [employer] to Stand: Independent Stand pivot transfers: Independent          Ambulation/Gait Ambulation/Gait assistance: Supervision Ambulation Distance (Feet): 350 Feet Assistive device: None Gait Pattern/deviations: Decreased step length - right;Decreased step length - left Gait velocity: decreased   General Gait Details: decr environmental scanning   Stairs            Wheelchair Mobility    Modified Rankin (Stroke Patients  Only)       Balance Overall balance assessment: Modified Independent                                  Cognition Arousal/Alertness: Awake/alert Behavior During Therapy: WFL for tasks assessed/performed Overall Cognitive Status: History of cognitive impairments - at baseline                      Exercises General Exercises - Lower Extremity Heel Slides: AROM;Right;Left;10 reps;Standing Hip ABduction/ADduction: AROM;Right;Left;10 reps;Standing Toe Raises: AROM;Both;10 reps;Standing Heel Raises: AROM;Both;10 reps;Standing Mini-Sqauts: AROM;Both;10 reps;Standing    General Comments General comments (skin integrity, edema, etc.): able to look over shoulder without support with good balance      Pertinent Vitals/Pain Pain Assessment: No/denies pain    Home Living                      Prior Function            PT Goals (current goals can now be found in the care plan section) Acute Rehab PT Goals Patient Stated Goal: go home PT Goal Formulation: With patient/family Time For Goal Achievement: 12/28/15 Potential to Achieve Goals: Good Progress towards PT goals: Progressing toward goals    Frequency    Min 3X/week      PT Plan Current plan remains appropriate    Co-evaluation             End of Session Equipment Utilized During Treatment: Gait  belt Activity Tolerance: Patient tolerated treatment well Patient left: in bed;with call bell/phone within reach;with family/visitor present;Other (comment) (MD present)     Time: 9244-6286 PT Time Calculation (min) (ACUTE ONLY): 12 min  Charges:  $Gait Training: 8-22 mins                    G CodesMalka So, PT [phone]  [place] 12/17/2015, 11:26 AM

## 2015-12-17 NOTE — Care Management Important Message (Signed)
Important Message  Patient Details  Name: [name] MRN: [id number] Date of Birth: June 13, 1929   Medicare Important Message Given:  Yes    Nathen May 12/17/2015, 12:34 PM

## 2015-12-17 NOTE — Progress Notes (Signed)
Patient Name: [name] Date of Encounter: 12/17/2015  Primary Cardiologist: Dr. Delila Pereyra Problem List     Active Problems:   Diabetes mellitus type 2, controlled ([place])   Hyperlipidemia associated with type 2 diabetes mellitus ([place])   ANXIETY DEPRESSION   TOBACCO USER   Essential hypertension, benign   CAD (coronary artery disease)   Congestive heart failure (HCC)   Pacemaker   Chronic pain syndrome   S/P AVR (aortic valve replacement)   SSS (sick sinus syndrome) (HCC)   CKD stage 3 due to type 2 diabetes mellitus (HCC)   Hypoxia   Acute congestive heart failure (HCC)   Atrial fibrillation with RVR (HCC)   Pleural effusion, bilateral     Subjective   Breathing ok. Wants to go home. Walked yesterday and felt well. Received transfusion yesterday.  Inpatient Medications    Scheduled Meds: . sodium chloride   Intravenous Once  . apixaban  2.5 mg Oral BID  . cetaphil   Topical BID  . diltiazem  300 mg Oral Daily  . DULoxetine  30 mg Oral Daily  . ezetimibe  10 mg Oral Daily  . ferrous sulfate  325 mg Oral BID WC  . fluticasone  2 spray Each Nare Daily  . insulin aspart  0-15 Units Subcutaneous TID WC  . mouth rinse  15 mL Mouth Rinse BID  . metoprolol tartrate  100 mg Oral BID  . pantoprazole  40 mg Oral Daily  . rosuvastatin  20 mg Oral Daily  . sodium chloride flush  3 mL Intravenous Q12H  . sodium chloride flush  3 mL Intravenous Q12H   Continuous Infusions:   PRN Meds: sodium chloride, ALPRAZolam, HYDROcodone-acetaminophen, ondansetron **OR** ondansetron (ZOFRAN) IV, polyethylene glycol, sodium chloride flush   Vital Signs    Vitals:   12/16/15 1800 12/16/15 1958 12/16/15 2257 12/17/15 0500  BP: 127/64 (!) 142/83 (!) 166/90 128/84  Pulse:  83 (!) 110 70  Resp: 18 18  18   Temp:  99.3 F (37.4 C)  98.4 F (36.9 C)  TempSrc:  Oral  Oral  SpO2:  97%  91%  Weight:    58.8 kg (129 lb 11.2 oz)  Height:        Intake/Output Summary  (Last 24 hours) at 12/17/15 1040 Last data filed at 12/17/15 0900  Gross per 24 hour  Intake              800 ml  Output              350 ml  Net              450 ml   Filed Weights   12/15/15 0500 12/16/15 0501 12/17/15 0500  Weight: 58.4 kg (128 lb 11.2 oz) 58.2 kg (128 lb 4.8 oz) 58.8 kg (129 lb 11.2 oz)    Physical Exam   GEN: Well nourished, well developed, in no acute distress.  HEENT: Grossly normal.  Neck: Supple, no JVD, carotid bruits, or masses. Cardiac: Irregular, no murmurs, rubs, or gallops. No clubbing, cyanosis, edema.  Radials/DP/PT 2+ and equal bilaterally.  Respiratory:  Respirations regular and unlabored. Diminished R>L, but improved. GI: Soft, nontender, nondistended, BS + x 4. MS: no deformity or atrophy. Skin: warm and dry, no rash. Neuro:  Strength and sensation are intact. Psych: AAOx3.  Normal affect.  Labs    CBC  Recent Labs  12/15/15 0826 12/16/15 0400 12/16/15 1859  WBC 7.6 9.9  --  HGB 8.3* 7.9* 9.7*  HCT 27.9* 27.2* 32.2*  MCV 72.5* 71.4*  --   PLT 203 228  --    Basic Metabolic Panel  Recent Labs  12/15/15 1152 12/16/15 0916  NA 137 138  K 3.6 3.6  CL 103 101  CO2 26 29  GLUCOSE 135* 139*  BUN 23* 24*  CREATININE 1.69* 1.56*  CALCIUM 8.7* 9.0   Liver Function Tests No results for input(s): AST, ALT, ALKPHOS, BILITOT, PROT, ALBUMIN in the last 72 hours. No results for input(s): LIPASE, AMYLASE in the last 72 hours. Cardiac Enzymes  Recent Labs  12/14/15 1339 12/14/15 2016  TROPONINI <0.03 <0.03   BNP Invalid input(s): POCBNP D-Dimer No results for input(s): DDIMER in the last 72 hours. Hemoglobin A1C No results for input(s): HGBA1C in the last 72 hours. Fasting Lipid Panel No results for input(s): CHOL, HDL, LDLCALC, TRIG, CHOLHDL, LDLDIRECT in the last 72 hours. Thyroid Function Tests  Recent Labs  12/14/15 1339  TSH 2.322    Telemetry    Atrial Flutter - Personally Reviewed  ECG    See previous  note - Personally Reviewed  Radiology    No results found.  Cardiac Studies   TTE:10/18  ------------------------------------------------------------------- Study Conclusions  - Left ventricle: The cavity size was normal. There was mild   concentric hypertrophy. Systolic function was normal. The   estimated ejection fraction was in the range of 55% to 60%. The   study is not technically sufficient to allow evaluation of LV   diastolic function. - Aortic valve: Bioprosthetic AVR with trivial central AR no   periprosthetic regurgitation and no change in   gradients since previous echo Valve area (VTI): 0.68 cm^2. Valve   area (Vmax): 0.62 cm^2. Valve area (Vmean): 0.59 cm^2. - Mitral valve: There was mild regurgitation. - Left atrium: The atrium was mildly dilated. - Atrial septum: No defect or patent foramen ovale was identified. - Pulmonary arteries: PA peak pressure: 34 mm Hg (S). - Line: A venous catheter was visualized in the superior vena cava,   with its tip in the right atrium. No abnormal features noted.  Patient Profile     82 yo female with PMH of PAF on Eliquis, SSS s/p PPM (leads 2005, generator change 2014 [employer]. Jude), valvular heart disease with a biological aortic valve prosthesis (21 mm, 2005, Dr. Servando Snare), severe hypercholesterolemia who presented with c/o dyspnea and palpitations for the past couple of weeks. Found to be in atrial flutter with bilateral pleural effusions.   Assessment & Plan    1. Tachycardia: Better now.  Had atrial flutter.  On anticoagulation.  F/u Dr. Avon Gully. OK to discharge from a cardiac standpoint.  They may consider cardioversion after Eliquis has been taken without interruption.  2. Acute Respiratory Failure: Improved.  WOuld send home with furosemide 40 mg PO BID prn swelling or SHOB, to hopefully prevent readmission.   3. AVR: Stable on echo this admission.   4. Anemia: s/p transfusion  5. HTN: Bp improved.    Signed, Larae Grooms, MD 12/17/2015, 10:40 AM

## 2015-12-18 NOTE — Discharge Summary (Signed)
[facility] Discharge Summary  Patient name: [name] Medical record number: [id number] Date of birth: Oct 09, 1929 Age: 82 y.o. Gender: female Date of Admission: 12/13/2015  Date of Discharge: 12/17/2015 Admitting Physician: Alveda Reasons, MD  Primary Care Provider: Lavon Paganini, MD Consultants: Cardiology  Indication for Hospitalization: Dyspnea, palpitations  Discharge Diagnoses/Problem List:  CAD with pacemaker Paroxysmal a fib with chronic anticoagulation S/p aortic valve replacement CHF CKD Stage III Hearing loss DM HTN  Disposition: Home  Discharge Condition: Stable, improved  Discharge Exam:  General: Elderly female sitting up in bed in NAD Eyes: EOMI ENTM: MMM Neck: No lymphadenopathy Cardiovascular:  Tachycardic, regular rhythm Respiratory: On 2L nasal cannula, normal WOB, mild crackles bilateral bases Gastrointestinal: soft, NTND Extremities: warm and well perfused, no cyanosis, 1+ pitting edema to shins MSK: moves all extremities Neuro: Alert and oriented 3 Psych: Normal mood and affect  Brief Hospital Course:  ALEAYAH Murray a 82 y.o.femalepresenting with dyspnea and palpitations. PMH is significant for CADwith a pacemaker, paroxysmal A. fib with chronic anticoagulation, status post aortic valve replacement, CHF, CKD stage III, hearing loss, restless leg syndrome, DM, HTN, anxiety, depression, anemia, GERD, actinic keratosis, bilateral knee arthritis, chronic dry skin.   Presented with palpitations for one week with increased dyspnea and generalized weakness.  Likely acute CHF secondary to paroxysmal atrial fibrillation. On Eliquis for chronic anticoagulation. She began becoming short of breath 2 days prior to admission. Cough productive of thick white sputum. No fevers or chills. Endorsing worsening positional orthopnea for past 2 days as well. Last Echo march 2014 showing LVEF 55-60%.  Echo on  this admission unchanged with EF 55-60%.  Was given IV Lasix for diuresis with daily weights and I's and O's monitoring.  From a respiratory standpoint, satting well on RA. Patient remained persistently tachycardic to 110-120s.  However, she remained asymptomatic.  Cardiology consulted and recommended increasing her Diltiazem to 300 mg.  Eliquis was continued however cardioversion not an option as it was briefly held this admission.  Per cardiology recs, she was stable for discharge and can consider cardioversion after a few more weeks of uninterrupted Eliquis.  She was discharged on po Lasix to prevent fluid accumulation and was no longer dyspneic.  No leg swelling and stable at time of discharge.   Issues for Follow Up:  1. Check Hgb at next visit given she required a unit transfusion prior to discharge date. 2. Follow up with cardiology after few weeks of uninterrupted Eliquis to reconisder cardioversion.  Significant Procedures: None  Significant Labs and Imaging:   Recent Labs Lab 12/14/15 0612 12/15/15 0826 12/16/15 0400 12/16/15 1859  WBC 9.3 7.6 9.9  --   HGB 8.4* 8.3* 7.9* 9.7*  HCT 28.2* 27.9* 27.2* 32.2*  PLT 213 203 228  --     Recent Labs Lab 12/13/15 2050 12/14/15 0612 12/15/15 1152 12/16/15 0916  NA 137 139 137 138  K 3.9 3.6 3.6 3.6  CL 106 104 103 101  CO2 '22 26 26 29  ' GLUCOSE 171* 108* 135* 139*  BUN 18 16 23* 24*  CREATININE 1.24* 1.25* 1.69* 1.56*  CALCIUM 9.1 9.2 8.7* 9.0   Results/Tests Pending at Time of Discharge: None  Discharge Medications:    Medication List    STOP taking these medications   atenolol 100 MG tablet Commonly known as:  TENORMIN     TAKE these medications   ALPRAZolam 1 MG tablet Commonly known as:  XANAX Take 0.5  tablets (0.5 mg total) by mouth 2 (two) times daily as needed for anxiety. What changed:  how much to take   benazepril 20 MG tablet Commonly known as:  LOTENSIN TAKE 1 TABLET (20 MG TOTAL) BY MOUTH 2 (TWO)  TIMES DAILY.   CRESTOR 10 MG tablet Generic drug:  rosuvastatin TAKE 1 TABLET (10 MG TOTAL) BY MOUTH DAILY.   diltiazem 300 MG 24 hr capsule Commonly known as:  CARDIZEM CD Take 1 capsule (300 mg total) by mouth daily. What changed:  See the new instructions.   DULoxetine 30 MG capsule Commonly known as:  CYMBALTA Take 1 capsule (30 mg total) by mouth daily.   ELIQUIS 2.5 MG Tabs tablet Generic drug:  apixaban TAKE 1 TABLET (2.5 MG TOTAL) BY MOUTH 2 (TWO) TIMES DAILY.   ferrous sulfate 325 (65 FE) MG tablet Take 1 tablet (325 mg total) by mouth 2 (two) times daily with a meal. What changed:  how much to take  when to take this   fluticasone 50 MCG/ACT nasal spray Commonly known as:  FLONASE PLACE 2 SPRAYS INTO BOTH NOSTRILS DAILY.   HYDROcodone-acetaminophen 7.5-325 MG tablet Commonly known as:  NORCO Take 1 tablet by mouth 2 (two) times daily as needed. What changed:  reasons to take this   metFORMIN 500 MG tablet Commonly known as:  GLUCOPHAGE Take 1 tablet (500 mg total) by mouth 2 (two) times daily with a meal.   metoprolol 100 MG tablet Commonly known as:  LOPRESSOR Take 1 tablet (100 mg total) by mouth 2 (two) times daily.   omeprazole 20 MG capsule Commonly known as:  PRILOSEC TAKE 1 CAPSULE (20 MG TOTAL) BY MOUTH DAILY.   ONE TOUCH ULTRA 2 w/Device Kit   ONE TOUCH ULTRA TEST test strip Generic drug:  glucose blood TEST BLOOD SUGAR TWICE A DAY   ZETIA 10 MG tablet Generic drug:  ezetimibe TAKE 1 TABLET (10 MG TOTAL) BY MOUTH DAILY.      Discharge Instructions: Please refer to Patient Instructions section of EMR for full details.  Patient was counseled important signs and symptoms that should prompt return to medical care, changes in medications, dietary instructions, activity restrictions, and follow up appointments.   Follow-Up Appointments: Follow-up Information    Lavon Paganini, MD. Go on 12/17/2015.   Specialty:  Family Medicine Why:   1.45 pm. For hospital follow up.  Contact information: [place] [id number] [phone]          Lovenia Kim, MD 12/18/2015, 1:21 AM PGY-1, [place]

## 2015-12-20 ENCOUNTER — Telehealth: Payer: Self-pay | Admitting: *Deleted

## 2015-12-20 NOTE — Telephone Encounter (Addendum)
Transitional Care Post-Discharge Follow-Up Phone Call:   Date of Discharge: 12/17/2015  Principal Discharge Diagnosis:  CAD with pacemaker Paroxysmal a fib with chronic anticoagulation S/p aortic valve replacement CHF CKD Stage III Hearing loss DM HTN  Reason for Chronic Case Management: Co-morbidities as listed above  Post-discharge Communication: Call Completed: Yes, with patient and daughter, [name], who is primary caregiver  Please check all that apply:  X  Patient is knowledgeable of his/her condition(s) and/or treatment.  X  Family and/or caregiver is knowledgeable of patient's condition(s) and/or treatment.  ? Patient is caring for self at home.  ? Patient is receiving home health services. If so, name of agency.   Medication Reconciliation:  ? Medication list reviewed with patient.  X Patient able to obtain needed medications. If not, why? Patient states she has all her medications except lasix which she said her "heart doctor" told her to take. Lasix is not listed on current med list.  Activities of Daily Living:  ? Independent  X  Needs assist: Daughter helps patient with bathing and dressing, prepares meals and takes care of home management. ? [facility] (describe)   Community resources in place for patient:  X  None, however, daughter is hoping to have CNA from [facility] come in to help patient with ADL's as daughter is feeling "worn out." Encouraged to bring necessary paperwork to f/u appt on 12/23/2015 for PCP to review. ? Home Health  ? Assisted Living  ? Hospice  ? Support Group   Topics discussed: Patient states she is feeling better since discharge and denies any difficulty with her breathing. States she is trying to walk and is "staggering around." Patient states that she has been walking with a "walking cane." Asked patient if she had access to a walker and she said, "yes." Daughter reports several falls in the last year with no injuries. Daughter  states they prefer patient to use walker but patient feels that is a sign of "old age." Daughter states there are no tripping hazards such as throw rugs or wires or clutter in walking paths. Encouraged patient to walk with walker especially as she tries to build her strength following hospital discharge and she said she would. Asked if patient was weighing herself daily and patient reports she has recently moved and scale is not with her. Daughter states she can get scale today. Patient/daughter instructed to weigh patient daily without clothes or same amount of clothes upon waking and after voiding. Patient instructed to record daily weights and bring log to all future appts. F/u appt scheduled with PCP for 12/23/2015 at 11:15. Daughter, [name], able to bring patient to all appts.  Patient and daughter deny any further questions or concerns at this time and expressed their thanks for following up with patient. Velora Heckler, RN

## 2015-12-20 NOTE — Telephone Encounter (Signed)
Transitional Care Clinic Post-discharge Follow-Up Phone Call:  Date of Discharge: 12/17/2015  Principal Discharge Diagnosis(es):  CAD with pacemaker Paroxysmal a fib with chronic anticoagulation S/p aortic valve replacement CHF CKD Stage III Hearing loss DM HTN  Post-discharge Communication: Attempt #1 to reach patient and complete post-discharge follow-up phone call. Call placed to # [phone]; unable to reach patient. HIPPA compliant voicemail left requesting return call.  Call Completed: No                      Nicandro Perrault, Orvis Brill, RN

## 2015-12-23 ENCOUNTER — Ambulatory Visit (INDEPENDENT_AMBULATORY_CARE_PROVIDER_SITE_OTHER): Payer: Medicare Other | Admitting: Family Medicine

## 2015-12-23 ENCOUNTER — Encounter: Payer: Self-pay | Admitting: Family Medicine

## 2015-12-23 VITALS — BP 142/88 | HR 108 | Temp 97.5°F | Ht 64.0 in | Wt 132.2 lb

## 2015-12-23 DIAGNOSIS — R54 Age-related physical debility: Secondary | ICD-10-CM

## 2015-12-23 DIAGNOSIS — G894 Chronic pain syndrome: Secondary | ICD-10-CM

## 2015-12-23 DIAGNOSIS — I10 Essential (primary) hypertension: Secondary | ICD-10-CM | POA: Diagnosis not present

## 2015-12-23 DIAGNOSIS — I5033 Acute on chronic diastolic (congestive) heart failure: Secondary | ICD-10-CM | POA: Diagnosis not present

## 2015-12-23 DIAGNOSIS — D509 Iron deficiency anemia, unspecified: Secondary | ICD-10-CM

## 2015-12-23 DIAGNOSIS — I4891 Unspecified atrial fibrillation: Secondary | ICD-10-CM

## 2015-12-23 DIAGNOSIS — F341 Dysthymic disorder: Secondary | ICD-10-CM

## 2015-12-23 LAB — POCT HEMOGLOBIN: Hemoglobin: 10.1 g/dL — AB (ref 12.2–16.2)

## 2015-12-23 MED ORDER — ALPRAZOLAM 1 MG PO TABS: 0.5000 mg | ORAL_TABLET | Freq: Two times a day (BID) | ORAL | 0 refills | Status: DC | PRN

## 2015-12-23 MED ORDER — HYDROCODONE-ACETAMINOPHEN 7.5-325 MG PO TABS: 1.0000 | ORAL_TABLET | Freq: Two times a day (BID) | ORAL | 0 refills | Status: DC | PRN

## 2015-12-23 MED ORDER — FUROSEMIDE 40 MG PO TABS: 40.0000 mg | ORAL_TABLET | Freq: Two times a day (BID) | ORAL | 3 refills | Status: DC | PRN

## 2015-12-23 NOTE — Progress Notes (Signed)
TRANSITION OF CARE   Primary Care Physician (PCP): [facility]                                                   [place], [place] [address]                                                   [place]  Type of visit:        Face-to-face: 12/23/2015   Duration of visit in minutes:   5-10:     11-20:      21-30:  XX   31-60:      >60:   Date of Admission: 12/13/15  Date of Discharge: 12/17/15  Discharged from: Hospital  Discharge Diagnosis: CAD with pacemaker Paroxysmal A. fib with chronic anticoagulation Status post aortic valve replacement CHF CKD stage III Hearing loss T2 DM HTN  Summary of Admission: Presented with palpitations for one week with increased dyspnea and generalized weakness.  Likely acute CHF secondary to paroxysmal atrial fibrillation. On Eliquis for chronic anticoagulation. She began becoming short of breath 2 days prior to admission. Cough productive of thick white sputum. No fevers or chills. Endorsing worsening positional orthopnea for past 2 days as well. Last Echo march 2014 showing LVEF 55-60%.  Echo on this admission unchanged with EF 55-60%.  Was given IV Lasix for diuresis with daily weights and I's and O's monitoring.  From a respiratory standpoint, satting well on RA. Patient remained persistently tachycardic to 110-120s.  However, she remained asymptomatic.  Cardiology consulted and recommended increasing her Diltiazem to 300 mg.  Eliquis was continued however cardioversion not an option as it was briefly held this admission.  Per cardiology recs, she was stable for discharge and can consider cardioversion after a few more weeks of uninterrupted Eliquis.  She was discharged on po Lasix to prevent fluid accumulation and was no longer dyspneic.  No leg swelling and stable at time of discharge. Presented with palpitations for one week  with increased dyspnea and generalized weakness.  Likely acute CHF secondary to paroxysmal atrial fibrillation. On Eliquis for chronic anticoagulation. She began becoming short of breath 2 days prior to admission. Cough productive of thick white sputum. No fevers or chills. Endorsing worsening positional orthopnea for past 2 days as well. Last Echo march 2014 showing LVEF 55-60%.  Echo on this admission unchanged with EF 55-60%.  Was given IV Lasix for diuresis with daily weights and I's and O's monitoring.  From a respiratory standpoint, satting well on RA. Patient remained persistently tachycardic to 110-120s.  However,  she remained asymptomatic.  Cardiology consulted and recommended increasing her Diltiazem to 300 mg.  Eliquis was continued however cardioversion not an option as it was briefly held this admission.  Per cardiology recs, she was stable for discharge and can consider cardioversion after a few more weeks of uninterrupted Eliquis.  She was discharged on po Lasix to prevent fluid accumulation and was no longer dyspneic.  No leg swelling and stable at time of discharge.   TODAY's VISIT  Patient/Caregiver self-reported problems/concerns: feeling swimmy headed today - thinks it is related to allergies - usually gets better with flonase - forgot to take today  Has been doing well since d/c from hospital  Physical exam BP (!) 142/88   Pulse (!) 108   Temp 97.5 F (36.4 C) (Oral)   Ht 5\' 4"  (1.626 m)   Wt 132 lb 3.2 oz (60 kg)   BMI 22.69 kg/m  Gen: frail-appearing 82 y.o.female in NAD HEENT: PERRL, MMM, posterior oropharynx clear Pulm: Non-labored; CTAB, no wheezes or crackles CV: irreg irreg, no murmur; distal pulses intact/symmetric; no LE edema GI: + BS; soft, non-tender, non-distended, no HSM Skin: No rashes, wounds, ulcers MSK: slow gait and station; no digital clubbing/cyanosis Neuro: CN II-XII without deficits, sensation intact to light touch Psych: A&Ox3, becomes agitated  when discussing pain meds, speech clear and coherent    ASSESSMENT Patient Medical Status: stable  Active Diagnoses:  Essential hypertension, benign Well-controlled on manual recheck Continue current medications  Congestive heart failure (HCC) Euvolemic today Taking medications as prescribed Lasix not prescribed on discharge but prescription sent for Lasix 40 mg twice a day when necessary for swelling or shortness of breath today Patient will start recording daily weights at home  Atrial fibrillation with RVR ([place]) Rate controlled currently To make follow-up appointment in 3-4 weeks with cardiology to consider cardioversion  continue eliquis  Chronic pain syndrome Refilled one month of Norco per pain contract Another lengthy discussion about dangers and ineffectiveness of long-term management with benzos and opioids Follow-up in 2 weeks and wean narcotics Consider trial of Lyrica the patient is very resistant to trying other medicines as they "don't make her feel good"  Anemia Hemoglobin improved from 9.7-10.1 Continue iron supplementation    CHF - daily weights: Will start today - lasix not Rx'd at d/c - rx sent today  Not taking cymbalta - "made me sick" - only took for 1 wk  Does the patient have the support of a caregiver? yes  If yes, name of caregiver: daughter, [name]  Describe level of support the caregiver provides: lives with her, helps with bathing and dressing, prepares meals and home management  Confidence of patient and/or caregiver to carry out care at home: good, but is getting warn out - hoping to get some CNA help from [facility]  Does patient live alone? no  If no, who does patient live with?: daughter  Does patient/ caregiver have concerns about access to food? no  If yes, describe: n/a  Are there stairs in the home?: no   Is the home dwelling  Safe?: yes per daughter  If not safe, indicate concerns:  Fall Risk Assessment:  high risk for falls. She uses "walking cane". Daughter reports several falls in the last year with no injuries. Daughter states they prefer patient to use walker but patient feels that is a sign of "old age." Daughter states there are no tripping hazards such as throw rugs or wires or clutter in walking  paths.  MEDICATIONS Medication Reconciliation conducted with patient or caregiver? Yes/No: yes  New medications prescribed/discontinued upon discharge ? Yes/No: yes  Describe how patient takes medication: as prescribed with exception of crestor and cymbalta   Barriers identified related to medications Does not take Crestor due to muscle cramps - also tried pravacol in the past Does not take Cymbalta because it made her nauseous and didn't make her feel good like her Xanax and [facility] Ordered: Yes - rolling walker  PATIENT EDUCATION PROVIDED: fall prevention, using walker, daily weights  FOLLOW-UP : in 2 wks for chronic pain and attempting to wean benzo and narcotic   SIGNATURE AND DATE:  Virginia Crews, MD, MPH PGY-3,  [facility] 12/23/2015 12:21 PM

## 2015-12-23 NOTE — Patient Instructions (Signed)
Fall Prevention in the Home  Falls can cause injuries and can affect people from all age groups. There are many simple things that you can do to make your home safe and to help prevent falls. WHAT CAN I DO ON THE OUTSIDE OF MY HOME?  Regularly repair the edges of walkways and driveways and fix any cracks.  Remove high doorway thresholds.  Trim any shrubbery on the main path into your home.  Use bright outdoor lighting.  Clear walkways of debris and clutter, including tools and rocks.  Regularly check that handrails are securely fastened and in good repair. Both sides of any steps should have handrails.  Install guardrails along the edges of any raised decks or porches.  Have leaves, snow, and ice cleared regularly.  Use sand or salt on walkways during winter months.  In the garage, clean up any spills right away, including grease or oil spills. WHAT CAN I DO IN THE BATHROOM?  Use night lights.  Install grab bars by the toilet and in the tub and shower. Do not use towel bars as grab bars.  Use non-skid mats or decals on the floor of the tub or shower.  If you need to sit down while you are in the shower, use a plastic, non-slip stool..  Keep the floor dry. Immediately clean up any water that spills on the floor.  Remove soap buildup in the tub or shower on a regular basis.  Attach bath mats securely with double-sided non-slip rug tape.  Remove throw rugs and other tripping hazards from the floor. WHAT CAN I DO IN THE BEDROOM?  Use night lights.  Make sure that a bedside light is easy to reach.  Do not use oversized bedding that drapes onto the floor.  Have a firm chair that has side arms to use for getting dressed.  Remove throw rugs and other tripping hazards from the floor. WHAT CAN I DO IN THE KITCHEN?   Clean up any spills right away.  Avoid walking on wet floors.  Place frequently used items in easy-to-reach places.  If you need to reach for something  above you, use a sturdy step stool that has a grab bar.  Keep electrical cables out of the way.  Do not use floor polish or wax that makes floors slippery. If you have to use wax, make sure that it is non-skid floor wax.  Remove throw rugs and other tripping hazards from the floor. WHAT CAN I DO IN THE STAIRWAYS?  Do not leave any items on the stairs.  Make sure that there are handrails on both sides of the stairs. Fix handrails that are broken or loose. Make sure that handrails are as long as the stairways.  Check any carpeting to make sure that it is firmly attached to the stairs. Fix any carpet that is loose or worn.  Avoid having throw rugs at the top or bottom of stairways, or secure the rugs with carpet tape to prevent them from moving.  Make sure that you have a light switch at the top of the stairs and the bottom of the stairs. If you do not have them, have them installed. WHAT ARE SOME OTHER FALL PREVENTION TIPS?  Wear closed-toe shoes that fit well and support your feet. Wear shoes that have rubber soles or low heels.  When you use a stepladder, make sure that it is completely opened and that the sides are firmly locked. Have someone hold the ladder while you   are using it. Do not climb a closed stepladder.  Add color or contrast paint or tape to grab bars and handrails in your home. Place contrasting color strips on the first and last steps.  Use mobility aids as needed, such as canes, walkers, scooters, and crutches.  Turn on lights if it is dark. Replace any light bulbs that burn out.  Set up furniture so that there are clear paths. Keep the furniture in the same spot.  Fix any uneven floor surfaces.  Choose a carpet design that does not hide the edge of steps of a stairway.  Be aware of any and all pets.  Review your medicines with your healthcare provider. Some medicines can cause dizziness or changes in blood pressure, which increase your risk of falling. Talk  with your health care provider about other ways that you can decrease your risk of falls. This may include working with a physical therapist or trainer to improve your strength, balance, and endurance.   This information is not intended to replace advice given to you by your health care provider. Make sure you discuss any questions you have with your health care provider.   Document Released: 02/03/2002 Document Revised: 06/30/2014 Document Reviewed: 03/20/2014 Elsevier Interactive Patient Education 2016 Elsevier Inc.  

## 2015-12-23 NOTE — Assessment & Plan Note (Signed)
Hemoglobin improved from 9.7-10.1 Continue iron supplementation

## 2015-12-23 NOTE — Assessment & Plan Note (Signed)
Well-controlled on manual recheck Continue current medications

## 2015-12-23 NOTE — Assessment & Plan Note (Signed)
Refilled one month of Norco per pain contract Another lengthy discussion about dangers and ineffectiveness of long-term management with benzos and opioids Follow-up in 2 weeks and wean narcotics Consider trial of Lyrica the patient is very resistant to trying other medicines as they "don't make her feel good"

## 2015-12-23 NOTE — Assessment & Plan Note (Signed)
Euvolemic today Taking medications as prescribed Lasix not prescribed on discharge but prescription sent for Lasix 40 mg twice a day when necessary for swelling or shortness of breath today Patient will start recording daily weights at home

## 2015-12-23 NOTE — Assessment & Plan Note (Signed)
Rate controlled currently To make follow-up appointment in 3-4 weeks with cardiology to consider cardioversion  continue eliquis

## 2015-12-24 ENCOUNTER — Encounter: Payer: Self-pay | Admitting: Licensed Clinical Social Worker

## 2015-12-24 NOTE — Progress Notes (Signed)
Patient ID: [name], female   DOB: [birth year], 82 y.o.   MRN: [id number]  CSW received new request application for personal care services from Dr Brita Romp. Top of form completed and faxed to [employer] at [phone]. Patient and daughter informed by MD that referral was being made.   Plan: LCSW will follow up on referral in two weeks  Casimer Lanius, LCSW Licensed Clinical Social Worker [place]   [phone] 3:37 PM

## 2015-12-30 ENCOUNTER — Telehealth: Payer: Self-pay | Admitting: *Deleted

## 2015-12-30 NOTE — Telephone Encounter (Signed)
Spoke with patient and patient's daughter, [name], who states patient has been "doing good." States patient has c/o of "stomach issues". Patient describes as "nerves on fire in stomach." Patient denies feeling anxious. Has been eating well and denies fever, N/V, diarrhea. States cough is better and is non-productive. Continues to take alll meds. [name] made aware that referral for home health aid has been approved with [facility] as requested. She will call them tomorrow to discuss when services will start. Patient does not have walker yet. [name] will talk with [facility] tomorrow regarding walker and incontinence supplies. Patient expressed gratitude for call and will call back if stomach symptom persists or other issues arise.  Hubbard Hartshorn, RN, BSN

## 2016-01-04 ENCOUNTER — Telehealth: Payer: Self-pay | Admitting: *Deleted

## 2016-01-04 NOTE — Telephone Encounter (Addendum)
Attempted to reach patient to see how she is feeling and to schedule Transitions of Care 2 week f/u with Dr. Brita Romp for tomorrow or Thursday. Left message on home VM requesting a return call.  Hubbard Hartshorn, RN, BSN

## 2016-01-05 NOTE — Telephone Encounter (Signed)
Attempted to reach patient to see how she is feeling and to schedule Transitions of Care 2 week f/u with Dr. Brita Romp for today or tomorrow. Left message on home VM requesting a return call.  Hubbard Hartshorn, RN, BSN

## 2016-01-07 NOTE — Telephone Encounter (Signed)
Third attempt made to reach patient to learn how she is feeling. Message left on home VM requesting return call. Will need to schedule f/u with PCP.  Hubbard Hartshorn, RN, BSN

## 2016-01-11 ENCOUNTER — Encounter: Payer: Self-pay | Admitting: Licensed Clinical Social Worker

## 2016-01-11 NOTE — Progress Notes (Signed)
Patient ID: [name], female   DOB: Aug 11, 1929, 82 y.o.   MRN: [id number]  Follow up call to [facility] on 01/07/16 office closed for Holiday.  Call to [facility] to follow up on personal Care referral.  Per [facility], they did not receive patient's referral.   The referral needs to be refaxed. Referral re-faxed today.  Plan: LCSW will follow up with [facility] in 2 days to make sure fax was received.  Casimer Lanius, LCSW Licensed Clinical Social Worker [place]   [phone] 1:38 PM

## 2016-01-14 ENCOUNTER — Telehealth: Payer: Self-pay | Admitting: Licensed Clinical Social Worker

## 2016-01-14 NOTE — Progress Notes (Signed)
Follow up call to [facility] in reference to [place] referral.  Per Veleta Miners, in customer service the referral was received.  [facility] will contact patient to schedule an assessment.   Called patient to provide an update.  Left message to call LCSW in ref. to update on [facility] referral.   Plan:  LCSW will wait for return call  Casimer Lanius, LCSW Licensed Clinical Social Worker [place]   [phone] 2:45 PM

## 2016-01-23 ENCOUNTER — Telehealth: Payer: Self-pay | Admitting: Internal Medicine

## 2016-01-23 NOTE — Telephone Encounter (Signed)
**  After Hours/ Emergency Line Call*  Received a call to report that Glenice Bow has not been feeling well. She has been feeling jittery and weak for the last two days. She has also been having palpitations. She denies any chest pain, shortness of breath, slurred speech, focal weakness, changes in vision, or lower extremity edema. Pt's daughter states that she has been out of Eliquis since Friday and Diltiazem since Thursday. She called our office to get refills, but she hasn't heard back from [place]. I explained that we have been closed for the holidays and that she should try to call our office at least 1 week before she runs out of her medications, instead of calling on the day she runs out. Pt's daughter checked her vitals at home and they were BP 169/126 and HR 103. Pt had just taken her Metoprolol a few minutes prior. Because she is having palpitations and not feeling well, will call refills into her pharmacy over the phone. Pt has an appointment with her PCP in the next few days. I have refilled Eliquis #10 and Diltiazem #10. Pt can get full refills from her PCP at her appointment. We discussed in detail reasons to come to the ED. Pt's daughter voiced understanding.   Will forward to PCP.  Evette Doffing, MD PGY-2, [facility]

## 2016-01-24 ENCOUNTER — Other Ambulatory Visit: Payer: Self-pay | Admitting: *Deleted

## 2016-01-25 MED ORDER — METOPROLOL TARTRATE 100 MG PO TABS: 100.0000 mg | ORAL_TABLET | Freq: Two times a day (BID) | ORAL | 0 refills | Status: DC

## 2016-01-25 NOTE — Telephone Encounter (Signed)
2nd request.  Martin, Tamika L, RN  

## 2016-01-27 ENCOUNTER — Ambulatory Visit (INDEPENDENT_AMBULATORY_CARE_PROVIDER_SITE_OTHER): Payer: Medicare Other | Admitting: Family Medicine

## 2016-01-27 VITALS — BP 137/75 | HR 94 | Temp 97.6°F | Ht 64.0 in | Wt 134.2 lb

## 2016-01-27 DIAGNOSIS — G4700 Insomnia, unspecified: Secondary | ICD-10-CM

## 2016-01-27 DIAGNOSIS — F341 Dysthymic disorder: Secondary | ICD-10-CM

## 2016-01-27 DIAGNOSIS — I4891 Unspecified atrial fibrillation: Secondary | ICD-10-CM

## 2016-01-27 DIAGNOSIS — G894 Chronic pain syndrome: Secondary | ICD-10-CM

## 2016-01-27 DIAGNOSIS — G8929 Other chronic pain: Secondary | ICD-10-CM | POA: Diagnosis not present

## 2016-01-27 DIAGNOSIS — R296 Repeated falls: Secondary | ICD-10-CM

## 2016-01-27 MED ORDER — GABAPENTIN 100 MG PO CAPS: 100.0000 mg | ORAL_CAPSULE | Freq: Every day | ORAL | 1 refills | Status: DC

## 2016-01-27 MED ORDER — APIXABAN 2.5 MG PO TABS: ORAL_TABLET | ORAL | 11 refills | Status: DC

## 2016-01-27 MED ORDER — DILTIAZEM HCL ER COATED BEADS 300 MG PO CP24: 300.0000 mg | ORAL_CAPSULE | Freq: Every day | ORAL | 5 refills | Status: DC

## 2016-01-27 MED ORDER — ALPRAZOLAM 1 MG PO TABS: 0.5000 mg | ORAL_TABLET | Freq: Two times a day (BID) | ORAL | 0 refills | Status: DC | PRN

## 2016-01-27 MED ORDER — HYDROCODONE-ACETAMINOPHEN 5-325 MG PO TABS: 1.0000 | ORAL_TABLET | Freq: Two times a day (BID) | ORAL | 0 refills | Status: DC | PRN

## 2016-01-27 MED ORDER — METOPROLOL TARTRATE 100 MG PO TABS: 100.0000 mg | ORAL_TABLET | Freq: Two times a day (BID) | ORAL | 5 refills | Status: DC

## 2016-01-27 NOTE — Assessment & Plan Note (Signed)
Start gabapentin 100 mg daily at bedtime Would like to get her off the Xanax in the near future

## 2016-01-27 NOTE — Progress Notes (Signed)
Subjective:   KRUTI HORACEK is a 82 y.o. female with a history of Chronic back pain, A. fib with RVR, HTN, T2 DM, HLD, CK D3, anxiety and depression here for follow-up of A. fib, anxiety, chronic pain  A fib with RVR - ran out of diltiazem, metoprolol, eliquis on 11/22 - Dr Brett Albino refilled 10 pills of diltiazem and eliquis 11/26 - was having palpitations and racing heartbeat but that has improved since restarting medications - knows that she needs to call earlier for refills  - dizziness has resolved - no more falls since hospitalization - still has not gotten walker  Depression/anxiety - worried about losing medication after discussion last month - taking xanax BID for ~60 years - feels like anxiety is well controlled - Very resistant to talking to psychology or taking any other medications - Patient denies SI/HI - reports taking other medications in the past but doesn't remember what  Chronic back pain - taking Norco BID prn - pretty consistently - no radicular symptoms - knee pain is getting worse - no bowel/bladder incontinence, no fevers - hyperalgesia, pain everywhere   Review of Systems:  Per HPI.   Social History: Never smoker  Objective:  BP 137/75   Pulse 94   Temp 97.6 F (36.4 C) (Oral)   Ht 5\' 4"  (1.626 m)   Wt 134 lb 3.2 oz (60.9 kg)   BMI 23.04 kg/m   Gen:  82 y.o. female in NAD HEENT: NCAT, MMM, EOMI, PERRL, anicteric sclerae CV: RRR, no MRG, no JVD Resp: Non-labored, CTAB, no wheezes noted Abd: Soft, NTND, BS present, no guarding or organomegaly Ext: WWP, no edema MSK: No tenderness palpation over spinous processes or paraspinal muscles, gait slow and shuffling Neuro: Alert and oriented, speech normal       Chemistry      Component Value Date/Time   NA 138 12/16/2015 0916   K 3.6 12/16/2015 0916   CL 101 12/16/2015 0916   CO2 29 12/16/2015 0916   BUN 24 (H) 12/16/2015 0916   CREATININE 1.56 (H) 12/16/2015 0916   CREATININE 1.27 (H)  09/02/2015 1124      Component Value Date/Time   CALCIUM 9.0 12/16/2015 0916   ALKPHOS 42 06/14/2015 1507   AST 18 06/14/2015 1507   ALT 10 06/14/2015 1507   BILITOT 0.3 06/14/2015 1507      Lab Results  Component Value Date   WBC 9.9 12/16/2015   HGB 10.1 (A) 12/23/2015   HCT 32.2 (L) 12/16/2015   MCV 71.4 (L) 12/16/2015   PLT 228 12/16/2015   Lab Results  Component Value Date   TSH 2.322 12/14/2015   Lab Results  Component Value Date   HGBA1C 5.7 09/02/2015   Assessment & Plan:     REIGHLYNN SWINEY is a 82 y.o. female here for   Recurrent falls No further falls since last follow-up Prescription for walker given  Atrial fibrillation with RVR ([place]) Currently rate controlled Needs to make follow-up appointment with cardiology as they were considering cardioversion Continue L, diltiazem, metoprolol Refills given today  Insomnia Start gabapentin 100 mg daily at bedtime Would like to get her off the Xanax in the near future  Chronic pain syndrome Decrease Norco to 5 mg dosing Continue twice a day when necessary frequency Starting gabapentin to help with chronic pain Follow-up in one month and consider decreasing Norco dosing further and increasing gabapentin  ANXIETY DEPRESSION One month supply of Xanax given Patient becomes very  agitated with discussions of stopping her Xanax. She seems to be exhibiting signs of benzo dependence. She is less tearful today. Another lengthy discussion about the dangers and effectiveness of long-term management benzos and opioids occurred today Discussed plan to attempt to taper benzos after gabapentin is effective     Virginia Crews, MD MPH PGY-3,  [facility] 01/27/2016  5:19 PM

## 2016-01-27 NOTE — Assessment & Plan Note (Signed)
One month supply of Xanax given Patient becomes very agitated with discussions of stopping her Xanax. She seems to be exhibiting signs of benzo dependence. She is less tearful today. Another lengthy discussion about the dangers and effectiveness of long-term management benzos and opioids occurred today Discussed plan to attempt to taper benzos after gabapentin is effective

## 2016-01-27 NOTE — Assessment & Plan Note (Signed)
Currently rate controlled Needs to make follow-up appointment with cardiology as they were considering cardioversion Continue L, diltiazem, metoprolol Refills given today

## 2016-01-27 NOTE — Assessment & Plan Note (Signed)
No further falls since last follow-up Prescription for walker given

## 2016-01-27 NOTE — Patient Instructions (Signed)
We will decrease her pain medicines with 5 mg tablets. I refilled her other medications. Start gabapentin 100 mg daily at bedtime for your chronic pain and you are insomnia.  I will see back in one month to follow-up on these issues.  Take care, Dr. Jacinto Reap

## 2016-01-27 NOTE — Assessment & Plan Note (Signed)
Decrease Norco to 5 mg dosing Continue twice a day when necessary frequency Starting gabapentin to help with chronic pain Follow-up in one month and consider decreasing Norco dosing further and increasing gabapentin

## 2016-01-31 ENCOUNTER — Other Ambulatory Visit: Payer: Self-pay | Admitting: *Deleted

## 2016-01-31 DIAGNOSIS — K219 Gastro-esophageal reflux disease without esophagitis: Secondary | ICD-10-CM

## 2016-01-31 MED ORDER — OMEPRAZOLE 20 MG PO CPDR: DELAYED_RELEASE_CAPSULE | ORAL | 1 refills | Status: DC

## 2016-02-17 ENCOUNTER — Ambulatory Visit: Payer: Medicare Other | Admitting: Family Medicine

## 2016-02-20 ENCOUNTER — Other Ambulatory Visit: Payer: Self-pay | Admitting: Family Medicine

## 2016-02-20 DIAGNOSIS — E113299 Type 2 diabetes mellitus with mild nonproliferative diabetic retinopathy without macular edema, unspecified eye: Secondary | ICD-10-CM

## 2016-03-02 ENCOUNTER — Ambulatory Visit: Payer: Medicare Other | Admitting: Family Medicine

## 2016-03-15 ENCOUNTER — Ambulatory Visit: Payer: Medicare Other | Admitting: Family Medicine

## 2016-03-17 ENCOUNTER — Other Ambulatory Visit: Payer: Self-pay | Admitting: Family Medicine

## 2016-03-17 DIAGNOSIS — F341 Dysthymic disorder: Secondary | ICD-10-CM

## 2016-03-17 NOTE — Telephone Encounter (Signed)
Needs refill on her hydrocodone maybe zanax.  Pt doesn't know about these. Please call when ready for pickup.

## 2016-03-19 NOTE — Telephone Encounter (Signed)
Patient needs to be seen to receive refills on controlled substances.  Please schedule an appt for her.  Thanks!  Virginia Crews, MD, MPH PGY-3,  [facility] 03/19/2016 9:10 AM

## 2016-03-20 NOTE — Telephone Encounter (Signed)
Patient has an appt on 04/06/16. Nat Christen, CMA

## 2016-03-21 DIAGNOSIS — I10 Essential (primary) hypertension: Secondary | ICD-10-CM | POA: Diagnosis not present

## 2016-03-21 DIAGNOSIS — R5383 Other fatigue: Secondary | ICD-10-CM | POA: Diagnosis not present

## 2016-03-21 DIAGNOSIS — Z13220 Encounter for screening for lipoid disorders: Secondary | ICD-10-CM | POA: Diagnosis not present

## 2016-03-21 DIAGNOSIS — N390 Urinary tract infection, site not specified: Secondary | ICD-10-CM | POA: Diagnosis not present

## 2016-03-21 DIAGNOSIS — D649 Anemia, unspecified: Secondary | ICD-10-CM | POA: Diagnosis not present

## 2016-03-24 ENCOUNTER — Other Ambulatory Visit: Payer: Self-pay | Admitting: Family Medicine

## 2016-03-24 ENCOUNTER — Other Ambulatory Visit: Payer: Self-pay | Admitting: Cardiovascular Disease

## 2016-03-24 DIAGNOSIS — E113299 Type 2 diabetes mellitus with mild nonproliferative diabetic retinopathy without macular edema, unspecified eye: Secondary | ICD-10-CM

## 2016-04-04 ENCOUNTER — Encounter: Payer: Self-pay | Admitting: *Deleted

## 2016-04-04 ENCOUNTER — Ambulatory Visit (INDEPENDENT_AMBULATORY_CARE_PROVIDER_SITE_OTHER): Payer: Medicare Other | Admitting: Family Medicine

## 2016-04-04 ENCOUNTER — Encounter: Payer: Self-pay | Admitting: Family Medicine

## 2016-04-04 ENCOUNTER — Encounter: Payer: Medicare Other | Admitting: Cardiovascular Disease

## 2016-04-04 VITALS — BP 130/60 | HR 72 | Temp 98.2°F | Wt 132.2 lb

## 2016-04-04 DIAGNOSIS — G8929 Other chronic pain: Secondary | ICD-10-CM

## 2016-04-04 DIAGNOSIS — F341 Dysthymic disorder: Secondary | ICD-10-CM | POA: Diagnosis not present

## 2016-04-04 DIAGNOSIS — G894 Chronic pain syndrome: Secondary | ICD-10-CM

## 2016-04-04 NOTE — Progress Notes (Signed)
Subjective:   [name] is a 83 y.o. female with a history of Chronic back, pain, A fib with RVR, HTN, T2DM, HLD, CKD3, anxiety, depression here for Chronic pain and anxiety management follow-up  Depression/anxiety - Patient and her daughter are very angry upon arrival today - There are mad that her Xanax dose was decreased to 0.5 mg twice a day - Discussed that this change was made when she was hospitalized in 11/2015 - She reports that she continued to take 1 mg twice a day and ran out a month early - taking xanax BID for ~60 years - feels like Xanax helps with her nerves, mood, shakes, focus - Very resistant to talking to psychology or taking any other medications - Patient denies SI/HI - reports taking other medications in the past but doesn't remember what - Reports that after she ran out of her Xanax early from taking a higher dose than prescribed, she "got some by other means" - When asked if she was getting some without a prescription, she reports that she will not share her secrets  Chronic back pain - taking Norco BID prn - pretty consistently - Was reduced to 5 mg of hydrocodone at each dose at last visit 2 months ago - no radicular symptoms - hyperalgesia, pain everywhere - Reports that she does not need me to prescribe Norco anymore because I will not give her Xanax and she knows that this will soon be tapered down as well - She reports that she ran out of this early and has not been taking any for the last 2 weeks - She threatens that she will have other providers prescribe her more narcotics and benzodiazepines  Review of Systems:  Per HPI.   Social History: Never smoker  Objective:  BP 130/60   Pulse 72   Temp 98.2 F (36.8 C) (Oral)   Wt 132 lb 3.2 oz (60 kg)   SpO2 96%   BMI 22.69 kg/m   Gen:  83 y.o. female in NAD HEENT: NCAT, MMM, PERRL, anicteric sclerae CV: RRR, no MRG Resp: Non-labored, CTAB, no wheezes noted Abd: Soft, NTND, BS  present, no guarding or organomegaly Ext: WWP, no edema MSK: Refuses to participate in exam Neuro: Alert and oriented, speech normal Psych: Agitated, non-participatory       Chemistry      Component Value Date/Time   NA 138 12/16/2015 0916   K 3.6 12/16/2015 0916   CL 101 12/16/2015 0916   CO2 29 12/16/2015 0916   BUN 24 (H) 12/16/2015 0916   CREATININE 1.56 (H) 12/16/2015 0916   CREATININE 1.27 (H) 09/02/2015 1124      Component Value Date/Time   CALCIUM 9.0 12/16/2015 0916   ALKPHOS 42 06/14/2015 1507   AST 18 06/14/2015 1507   ALT 10 06/14/2015 1507   BILITOT 0.3 06/14/2015 1507      Lab Results  Component Value Date   WBC 9.9 12/16/2015   HGB 10.1 (A) 12/23/2015   HCT 32.2 (L) 12/16/2015   MCV 71.4 (L) 12/16/2015   PLT 228 12/16/2015   Lab Results  Component Value Date   TSH 2.322 12/14/2015   Lab Results  Component Value Date   HGBA1C 5.7 09/02/2015   Assessment & Plan:     [name] is a 83 y.o. female here for   ANXIETY DEPRESSION Patient very agitated with discussions of her Xanax She has been taking more than what has been prescribed She seems  to be obtaining some him potentially illegal ways without a prescription She is exhibiting signs of benzo addiction and dependence We had a very lengthy discussion about the dangers and lack of effectiveness of long-term management with benzos and opioids Discussed that these medications can lead to death given her multiple comorbidities and advanced age, and patient reports that she is okay with dying as long as she has Xanax Her daughter also reports that the patient has lived a long enough life and she would like her to just have Xanax for the remainder of it even if it is short  No evidence of benzo withdrawal currently Given low dose of prescription, safe to discontinue Xanax at this point Attempted to discuss warning signs for withdrawal as she has been taking this by other means and she becomes  very angry and will not listen  Chronic pain syndrome Attempted to discuss chronic pain management with the patient She is unwilling to try other more effective medications for her chronic pain She never took gabapentin as was prescribed Offered prescription for Norco with plan to continue to taper down slowly, the patient refused because she "is willing to do without this if she can just have Xanax"    Virginia Crews, MD MPH PGY-3,  [facility] 04/05/2016  5:47 PM

## 2016-04-04 NOTE — Patient Instructions (Signed)
We have stopped your Xanax, as we have discussed previously.  You were reduced to a low dose in the hospital, so this is safe to stop.  You have stopped your pain medications.  We should see you back in the next month to discuss other chronic medical conditions, such as blood pressure.  Take Care, Dr. Jacinto Reap

## 2016-04-05 NOTE — Assessment & Plan Note (Signed)
Attempted to discuss chronic pain management with the patient She is unwilling to try other more effective medications for her chronic pain She never took gabapentin as was prescribed Offered prescription for Norco with plan to continue to taper down slowly, the patient refused because she "is willing to do without this if she can just have Xanax"

## 2016-04-05 NOTE — Assessment & Plan Note (Addendum)
Patient very agitated with discussions of her Xanax She has been taking more than what has been prescribed She seems to be obtaining some him potentially illegal ways without a prescription She is exhibiting signs of benzo addiction and dependence We had a very lengthy discussion about the dangers and lack of effectiveness of long-term management with benzos and opioids Discussed that these medications can lead to death given her multiple comorbidities and advanced age, and patient reports that she is okay with dying as long as she has Xanax Her daughter also reports that the patient has lived a long enough life and she would like her to just have Xanax for the remainder of it even if it is short  No evidence of benzo withdrawal currently Given low dose of prescription, safe to discontinue Xanax at this point Attempted to discuss warning signs for withdrawal as she has been taking this by other means and she becomes very angry and will not listen

## 2016-04-24 ENCOUNTER — Telehealth: Payer: Self-pay | Admitting: Family Medicine

## 2016-04-24 NOTE — Telephone Encounter (Signed)
Called patient to discuss possibility of scheduling her in [place] [facility].  Daughter reports that patient is not doing well after Xanax was "snatched from her."  She would rather have an appointment sooner than Thursday. She is not interested in discussing this further with me. Appointment was scheduled with Dr. Lajuana Ripple on 04/25/16 and 11 AM. Daughter denies any withdrawal symptoms but reports her mother is depressed about not being able to have her Xanax anymore.  Of note, patient was overusing her Xanax at a higher dose than prescribed and showing signs of dependence at last visit. She also mentioned getting her Xanax by other means at that visit. Please see last office visit with me prior to upcoming visit.  Virginia Crews, MD, MPH PGY-3,  [facility] 04/24/2016 9:39 AM

## 2016-04-25 ENCOUNTER — Ambulatory Visit: Payer: Medicare Other | Admitting: Family Medicine

## 2016-05-03 ENCOUNTER — Telehealth: Payer: Self-pay | Admitting: Family Medicine

## 2016-05-03 NOTE — Telephone Encounter (Signed)
Daughter [name] would like to talk to someone about the drs treatment of her mother at her last visit.  Daughter feels the dr was rude and should be reported to a supervisor at the office. Please call [name] to discuss this

## 2016-05-18 ENCOUNTER — Other Ambulatory Visit: Payer: Self-pay | Admitting: Cardiovascular Disease

## 2016-05-18 ENCOUNTER — Other Ambulatory Visit: Payer: Self-pay | Admitting: Family Medicine

## 2016-05-18 DIAGNOSIS — K219 Gastro-esophageal reflux disease without esophagitis: Secondary | ICD-10-CM

## 2016-05-25 DIAGNOSIS — R8299 Other abnormal findings in urine: Secondary | ICD-10-CM | POA: Diagnosis not present

## 2016-05-25 DIAGNOSIS — N390 Urinary tract infection, site not specified: Secondary | ICD-10-CM | POA: Diagnosis not present

## 2016-05-25 DIAGNOSIS — Z79899 Other long term (current) drug therapy: Secondary | ICD-10-CM | POA: Diagnosis not present

## 2016-05-25 DIAGNOSIS — I10 Essential (primary) hypertension: Secondary | ICD-10-CM | POA: Diagnosis not present

## 2016-05-25 DIAGNOSIS — M545 Low back pain: Secondary | ICD-10-CM | POA: Diagnosis not present

## 2016-05-25 DIAGNOSIS — E119 Type 2 diabetes mellitus without complications: Secondary | ICD-10-CM | POA: Diagnosis not present

## 2016-05-30 ENCOUNTER — Telehealth: Payer: Self-pay | Admitting: Cardiovascular Disease

## 2016-05-30 NOTE — Telephone Encounter (Signed)
Remote transmission received. Presenting rhythm: AF/Vs ~115bpm. Persistent since 02/04/16. V rates btwn 110-120bpm ~30% during AF (per histogram).  Spoke to patient's daughter regarding elevated HR. Daughter states that patient has not c/o palps x 1 week, but they have noticed that her HR has been elevated with the cuff that they're using at home. Daughter states that she has also noticed a decline in patient's energy level x 2-3 months, but she attributes that to the changes her PCP did to her Xanax Rx. Daughter states that patient hasn't c/o increased ShOB, or any other sx's.   Daughter did admit that patient had missed some doses of her Cardizem, but states that it was a couple of weeks ago, and she has since resumed it. Patient has also been taking Lopressor as Rx'd without any missed doses.  Systolic BP has been running between 140-145mmHg.  I encouraged daughter to make sure that pt takes all of her medications as Rx'd, but especially her Lopressor and cardizem to prevent RVR. Daughter verbalized understanding.  Will forward information to Dr.Croitoru and notify patient with any recommendations.  Patient is scheduled to f/u in 06-2016.

## 2016-05-30 NOTE — Telephone Encounter (Signed)
Spoke w/ pt daughter and she stated that pt has missed about two doses of her diltiazem.Instructed pt daughter to send a remote transmission w/ pt home monitor.

## 2016-05-30 NOTE — Telephone Encounter (Signed)
New message      Pt has a pacemaker.  Daughter states that pt's heart rate is high---113-116.  Do you want her to send in a remote transmission?

## 2016-05-30 NOTE — Telephone Encounter (Signed)
Please increase diltiazem to 360 mg daily and call us back with HR, BP in about 2 weeks. MCr

## 2016-05-31 MED ORDER — DILTIAZEM HCL ER COATED BEADS 360 MG PO CP24: 360.0000 mg | ORAL_CAPSULE | Freq: Every day | ORAL | 1 refills | Status: DC

## 2016-05-31 NOTE — Telephone Encounter (Signed)
Spoke with daughter and explained that we would be increasing her mom's diltiazem from 300mg  to 360mg  daily. I confirmed pharmacy and reviewed dosing instructions. Daughter states that they have a BP cuff. She will call back in two weeks with BP and HR.

## 2016-06-13 ENCOUNTER — Other Ambulatory Visit: Payer: Self-pay | Admitting: Family Medicine

## 2016-06-26 ENCOUNTER — Other Ambulatory Visit: Payer: Self-pay | Admitting: Family Medicine

## 2016-06-26 DIAGNOSIS — D513 Other dietary vitamin B12 deficiency anemia: Secondary | ICD-10-CM | POA: Diagnosis not present

## 2016-06-26 DIAGNOSIS — E119 Type 2 diabetes mellitus without complications: Secondary | ICD-10-CM | POA: Diagnosis not present

## 2016-06-26 DIAGNOSIS — J309 Allergic rhinitis, unspecified: Secondary | ICD-10-CM | POA: Diagnosis not present

## 2016-07-22 ENCOUNTER — Other Ambulatory Visit: Payer: Self-pay | Admitting: Cardiovascular Disease

## 2016-07-23 DIAGNOSIS — Z953 Presence of xenogenic heart valve: Secondary | ICD-10-CM | POA: Insufficient documentation

## 2016-07-23 NOTE — Progress Notes (Signed)
Patient ID: [name], female   DOB: 12/16/1929, 83 y.o.   MRN: [id number]     Cardiology Office Note    Date:  07/26/2016   ID:  [name], DOB 04-15-1929, MRN [id number]  PCP:  Imagene Riches, NP  Cardiologist:   Sanda Klein, MD   No chief complaint on file.   History of Present Illness:  [name] is a 83 y.o. female with a history of paroxysmal atrial fibrillation on chronic anticoagulation, sinus node dysfunction s/p dual chamber pacemaker (leads 2005, generator change 2014 [employer]), valvular heart disease with a biological aortic valve prosthesis (21 mm, 2005, Dr. Servando Snare), severe hypercholesterolemia. She has not had clinical coronary disease, but preoperative cardiac catheterization in 2005 showed 40% LAD stenosis.   The patient specifically denies any chest pain at rest exertion, dyspnea at rest or with exertion, orthopnea, paroxysmal nocturnal dyspnea, syncope, palpitations, focal neurological deficits, intermittent claudication, lower extremity edema, unexplained weight gain, cough, hemoptysis or wheezing.  She has established new primary care follow-up at [facility] with Heide Scales, [place].  Pacemaker interrogation shows normal device function. She has a Insurance claims handler implanted in 2014. Atrial lead parameters are excellent. Ventricular lead threshold is slightly elevated chronically. Estimated generator longevity is 6-8 years. She paces the atrium 38 % of the time and only paces the ventricle 33 % of the time. She has been in persistent atrial fibrillation since December, almost without pause. There have been no episodes of rapid ventricular response. She is compliant with anticoagulation has not had any bleeding problems. She has a variety of musculoskeletal complaints but knows that she cannot take NSAIDs due to bleeding risk.  Despite the persistent arrhythmia, there has been no noticeable change in her exercise tolerance her  overall functional status.  Her most recent echocardiogram was performed in March 2014 showed normal bioprosthetic function and normal LVEF 55-60 %.  Past Medical History:  Diagnosis Date  . Anemia   . Anxiety   . CAD (coronary artery disease)   . CHF (congestive heart failure) (HCC)    EF 55% on 01/05/11, trace AR w/ Bioprosthetic valve  . DM2 (diabetes mellitus, type 2) ([place])   . GERD (gastroesophageal reflux disease)   . HLD (hyperlipidemia)   . HTN (hypertension)   . PAF (paroxysmal atrial fibrillation) ([place])   . Restless legs syndrome (RLS)   . Tobacco abuse     Past Surgical History:  Procedure Laterality Date  . AORTIC VALVE REPLACEMENT  08/25/2002   tissue valve  . CARDIOVERSION N/A 05/05/2014   Procedure: CARDIOVERSION;  Surgeon: Dorothy Spark, MD;  Location: [facility];  Service: Cardiovascular;  Laterality: N/A;  . PACEMAKER INSERTION  04/02/2012   [employer]  . PERMANENT PACEMAKER GENERATOR CHANGE N/A 04/02/2012   Procedure: PERMANENT PACEMAKER GENERATOR CHANGE;  Surgeon: Sanda Klein, MD;  Location: [facility] C[facility];  Service: Cardiovascular;  Laterality: N/A;    Outpatient Medications Prior to Visit  Medication Sig Dispense Refill  . ALPRAZolam (XANAX) 1 MG tablet Take 0.5 tablets (0.5 mg total) by mouth 2 (two) times daily as needed for anxiety. 60 tablet 0  . apixaban (ELIQUIS) 2.5 MG TABS tablet TAKE 1 TABLET (2.5 MG TOTAL) BY MOUTH 2 (TWO) TIMES DAILY. 60 tablet 11  . benazepril (LOTENSIN) 20 MG tablet TAKE 1 TABLET (20 MG TOTAL) BY MOUTH 2 (TWO) TIMES DAILY. 180 tablet 0  . Blood Glucose Monitoring Suppl (ONE TOUCH ULTRA 2) W/DEVICE KIT  0  . diltiazem (CARDIZEM CD) 360 MG 24 hr capsule TAKE 1 CAPSULE (360 MG TOTAL) BY MOUTH DAILY. 30 capsule 0  . ferrous sulfate 325 (65 FE) MG tablet Take 1 tablet (325 mg total) by mouth 2 (two) times daily with a meal. (Patient taking differently: Take 650 mg by mouth daily with breakfast. ) 60 tablet 3  . fluticasone (FLONASE)  50 MCG/ACT nasal spray PLACE 2 SPRAYS INTO BOTH NOSTRILS DAILY. 16 g 5  . furosemide (LASIX) 40 MG tablet TAKE 1 TABLET BY MOUTH TWICE A DAY AS NEEDED FOR SWELLING OR SHORTNESS OF BREATH 60 tablet 2  . metFORMIN (GLUCOPHAGE) 500 MG tablet TAKE 1 TABLET (500 MG TOTAL) BY MOUTH 2 (TWO) TIMES DAILY WITH A MEAL. 60 tablet 4  . metoprolol (LOPRESSOR) 100 MG tablet Take 1 tablet (100 mg total) by mouth 2 (two) times daily. 60 tablet 5  . omeprazole (PRILOSEC) 20 MG capsule TAKE 1 CAPSULE (20 MG TOTAL) BY MOUTH DAILY. 90 capsule 0  . ONE TOUCH ULTRA TEST test strip TEST BLOOD SUGAR TWICE A DAY 50 each 4  . ZETIA 10 MG tablet TAKE 1 TABLET (10 MG TOTAL) BY MOUTH DAILY. 90 tablet 3  . CRESTOR 10 MG tablet TAKE 1 TABLET (10 MG TOTAL) BY MOUTH DAILY. (Patient not taking: Reported on 07/26/2016) 90 tablet 2  . gabapentin (NEURONTIN) 100 MG capsule Take 1 capsule (100 mg total) by mouth at bedtime. (Patient not taking: Reported on 07/26/2016) 30 capsule 1  . HYDROcodone-acetaminophen (NORCO) 5-325 MG tablet Take 1 tablet by mouth 2 (two) times daily as needed for moderate pain. (Patient not taking: Reported on 07/26/2016) 60 tablet 0   No facility-administered medications prior to visit.      Allergies:   Pravachol [pravastatin sodium] and Tape   Social History   Social History  . Marital status: Widowed    Spouse name: N/A  . Number of children: 5  . Years of education: 5   Occupational History  . Retired-Farmer Retired   Social History Main Topics  . Smoking status: Never Smoker  . Smokeless tobacco: Current User    Types: Snuff  . Alcohol use No  . Drug use: No  . Sexual activity: Not on file   Other Topics Concern  . Not on file   Social History Narrative   Health Care POA:    Emergency Contact: daughter, [name] [phone]   End of Life Plan: gave pt ad pamphlet   Who lives with you: daughter and 2 grandchildren   Any pets: 2 dogs   Diet: pt has a varied diet of protein, starch and  vegetables.   Exercise: pt garden's daily   Seatbelts: pt does not wear seatbelt because of pace maker   Sun Exposure/Protection: pt does not use sun protection, has irregular dermatology appointments.   Hobbies: gardening, sitting on porch.           Family History:  The patient's family history includes Heart attack in her brother; Kidney disease in her sister.   ROS:   Please see the history of present illness.    ROS All other systems reviewed and are negative.   PHYSICAL EXAM:   VS:  BP 140/85 (BP Location: Right Arm, Patient Position: Sitting, Cuff Size: Normal)   Pulse 85   Ht '5\' 4"'  (1.626 m)   Wt 140 lb 12.8 oz (63.9 kg)   BMI 24.17 kg/m      General: Alert, oriented x3,  no distress Head: no evidence of trauma, PERRL, EOMI, no exophtalmos or lid lag, no myxedema, no xanthelasma; normal ears, nose and oropharynx Neck: normal jugular venous pulsations and no hepatojugular reflux; brisk carotid pulses without delay and no carotid bruits Chest: clear to auscultation, no signs of consolidation by percussion or palpation, normal fremitus, symmetrical and full respiratory excursions Cardiovascular: normal position and quality of the apical impulse, irregular rhythm, normal first and second heart sounds, 2/6 early peaking systolic ejection murmur in the aortic focus. Healthy left subclavian pacemaker site Abdomen: no tenderness or distention, no masses by palpation, no abnormal pulsatility or arterial bruits, normal bowel sounds, no hepatosplenomegaly Extremities: no clubbing, cyanosis or edema; 2+ radial, ulnar and brachial pulses bilaterally; 2+ right femoral, posterior tibial and dorsalis pedis pulses; 2+ left femoral, posterior tibial and dorsalis pedis pulses; no subclavian or femoral bruits Neurological: grossly nonfocal  Psych: euthymic mood, full affect  Wt Readings from Last 3 Encounters:  07/26/16 140 lb 12.8 oz (63.9 kg)  04/04/16 132 lb 3.2 oz (60 kg)  01/27/16 134  lb 3.2 oz (60.9 kg)      Studies/Labs Reviewed:   EKG:  EKG is  ordered today.  The ekg ordered today demonstrates Coarse atrial fibrillation with occasional ventricular pacing. Then natively conducted QRS complexes are followed by deep T-wave inversion in leads V4-V6 and in the inferior leads, possibly "memory T waves".  Recent Labs: 12/13/2015: B Natriuretic Peptide 936.4 12/14/2015: TSH 2.322 12/16/2015: BUN 24; Creatinine, Ser 1.56; Platelets 228; Potassium 3.6; Sodium 138 12/23/2015: Hemoglobin 10.1   Lipid Panel    Component Value Date/Time   CHOL 305 (H) 04/06/2009 2033   TRIG 203 (H) 04/06/2009 2033   HDL 45 04/06/2009 2033   CHOLHDL 6.8 Ratio 04/06/2009 2033   VLDL 41 (H) 04/06/2009 2033   LDLCALC 219 (H) 04/06/2009 2033   LDLDIRECT 141 (H) 06/14/2015 1507     ASSESSMENT:    1. Atrial fibrillation with RVR ([place])   2. Pacemaker   3. Coronary artery disease involving native coronary artery of native heart without angina pectoris   4. Hyperlipidemia associated with type 2 diabetes mellitus ([place])   5. Controlled type 2 diabetes mellitus without complication, unspecified whether long term insulin use ([place])   6. Essential hypertension, benign   7. History of aortic valve replacement with bioprosthetic valve   8. Long term (current) use of anticoagulants   9. CKD stage 3 due to type 2 diabetes mellitus ([place])      PLAN:  In order of problems listed above:  1. Atrial flutter/atrial fibrillation: Now in long-term persistent atrial fibrillation with good ventricular rate control and no obvious associated symptoms. On appropriate anticoagulation. CHADSVasc score 6 (age 50, gender, HBP, vascular disease, DM). There seems to be little reason to consider cardioversion and even less reason to prescribe antiarrhythmics with their pursuant side effects and risks 2. Pacemaker.  continue remote monitoring every 3 months and yearly office visits. 3. CAD: Asymptomatic. Minor coronary  artery disease by cardiac catheterization in 2005.  4. HLP:  On zetia due to statin intolerance.Time to reevaluate.She believes she had labs performed when she first saw [place]. We will request a copy. 5. DM:  A1c 5.7% last July, do not have more recent values. 6. HTN: Excellent blood pressure control 7. AVR: No clinical findings to suggest dysfunction of her biological prosthesis. Last echo performed in 2014. Probably wise to reevaluate by echoEvery 5 years or so. 8. Chronic apixaban anticoagulation:  She was quite anemic last March. Hemoglobin improved gradually even though she remained on anticoagulation.  9. CKD: Baseline GFR approximately 30-40 mL/minute    Medication Adjustments/Labs and Tests Ordered: Current medicines are reviewed at length with the patient today.  Concerns regarding medicines are outlined above.  Medication changes, Labs and Tests ordered today are listed in the Patient Instructions below. Patient Instructions  Dr Sallyanne Kuster recommends that you continue on your current medications as directed. Please refer to the Current Medication list given to you today.  Remote monitoring is used to monitor your Pacemaker or ICD from home. This monitoring reduces the number of office visits required to check your device to one time per year. It allows [place] to keep an eye on the functioning of your device to ensure it is working properly. You are scheduled for a device check from home on Wednesday, August 29th, 2018. You may send your transmission at any time that day. If you have a wireless device, the transmission will be sent automatically. After your physician reviews your transmission, you will receive a notification with your next transmission date.  Dr Sallyanne Kuster recommends that you schedule a follow-up appointment in 12 months with a pacemaker check. You will receive a reminder letter in the mail two months in advance. If you don't receive a letter, please call our office to  schedule the follow-up appointment.  If you need a refill on your cardiac medications before your next appointment, please call your pharmacy.      Signed, Sanda Klein, MD  07/26/2016 1:57 PM    [facility] [place], [place], [place]  [address] Phone: [phone]; Fax: [phone]

## 2016-07-26 ENCOUNTER — Ambulatory Visit (INDEPENDENT_AMBULATORY_CARE_PROVIDER_SITE_OTHER): Payer: Medicare Other | Admitting: Cardiovascular Disease

## 2016-07-26 ENCOUNTER — Encounter: Payer: Self-pay | Admitting: Cardiovascular Disease

## 2016-07-26 VITALS — BP 140/85 | HR 85 | Ht 64.0 in | Wt 140.8 lb

## 2016-07-26 DIAGNOSIS — E785 Hyperlipidemia, unspecified: Secondary | ICD-10-CM | POA: Diagnosis not present

## 2016-07-26 DIAGNOSIS — I2510 Atherosclerotic heart disease of native coronary artery without angina pectoris: Secondary | ICD-10-CM

## 2016-07-26 DIAGNOSIS — I10 Essential (primary) hypertension: Secondary | ICD-10-CM | POA: Diagnosis not present

## 2016-07-26 DIAGNOSIS — E1169 Type 2 diabetes mellitus with other specified complication: Secondary | ICD-10-CM

## 2016-07-26 DIAGNOSIS — Z7901 Long term (current) use of anticoagulants: Secondary | ICD-10-CM

## 2016-07-26 DIAGNOSIS — I495 Sick sinus syndrome: Secondary | ICD-10-CM | POA: Diagnosis not present

## 2016-07-26 DIAGNOSIS — Z950 Presence of cardiac pacemaker: Secondary | ICD-10-CM

## 2016-07-26 DIAGNOSIS — I4891 Unspecified atrial fibrillation: Secondary | ICD-10-CM

## 2016-07-26 DIAGNOSIS — E1122 Type 2 diabetes mellitus with diabetic chronic kidney disease: Secondary | ICD-10-CM

## 2016-07-26 DIAGNOSIS — Z953 Presence of xenogenic heart valve: Secondary | ICD-10-CM

## 2016-07-26 DIAGNOSIS — E119 Type 2 diabetes mellitus without complications: Secondary | ICD-10-CM | POA: Diagnosis not present

## 2016-07-26 DIAGNOSIS — N183 Chronic kidney disease, stage 3 (moderate): Secondary | ICD-10-CM

## 2016-07-26 LAB — CUP PACEART INCLINIC DEVICE CHECK
Battery Voltage: 2.96 V
Brady Statistic RA Percent Paced: 38 %
Brady Statistic RV Percent Paced: 33 %
Date Time Interrogation Session: 20180530135123
Implantable Lead Implant Date: 20050526
Implantable Lead Location: 753859
Implantable Lead Location: 753860
Implantable Lead Model: 5076
Lead Channel Impedance Value: 325 Ohm
Lead Channel Impedance Value: 437.5 Ohm
Lead Channel Pacing Threshold Amplitude: 1 V
Lead Channel Pacing Threshold Amplitude: 1.125 V
Lead Channel Pacing Threshold Pulse Width: 0.5 ms
Lead Channel Pacing Threshold Pulse Width: 1 ms
Lead Channel Pacing Threshold Pulse Width: 1 ms
Lead Channel Sensing Intrinsic Amplitude: 7.4 mV
Lead Channel Setting Pacing Amplitude: 2.125
Lead Channel Setting Sensing Sensitivity: 1 mV
MDC IDC LEAD IMPLANT DT: 20050526
MDC IDC MSMT BATTERY REMAINING LONGEVITY: 73 mo
MDC IDC MSMT BATTERY REMAINING PERCENTAGE: 95 % — AB
MDC IDC MSMT LEADCHNL RA SENSING INTR AMPL: 1.7 mV
MDC IDC MSMT LEADCHNL RV PACING THRESHOLD AMPLITUDE: 1 V
MDC IDC PG IMPLANT DT: 20140204
MDC IDC SET LEADCHNL RV PACING AMPLITUDE: 2.5 V
MDC IDC SET LEADCHNL RV PACING PULSEWIDTH: 1 ms
Pulse Gen Model: 2210
Pulse Gen Serial Number: 7445049

## 2016-07-26 NOTE — Patient Instructions (Signed)

## 2016-08-07 ENCOUNTER — Other Ambulatory Visit: Payer: Self-pay | Admitting: Family Medicine

## 2016-08-12 ENCOUNTER — Other Ambulatory Visit: Payer: Self-pay | Admitting: Family Medicine

## 2016-08-12 DIAGNOSIS — E113299 Type 2 diabetes mellitus with mild nonproliferative diabetic retinopathy without macular edema, unspecified eye: Secondary | ICD-10-CM

## 2016-08-14 MED ORDER — FUROSEMIDE 40 MG PO TABS: ORAL_TABLET | ORAL | 2 refills | Status: DC

## 2016-08-14 NOTE — Telephone Encounter (Signed)
2nd request.  Daritza Brees L, RN  

## 2016-08-20 ENCOUNTER — Other Ambulatory Visit: Payer: Self-pay | Admitting: Cardiovascular Disease

## 2016-08-21 DIAGNOSIS — Z79899 Other long term (current) drug therapy: Secondary | ICD-10-CM | POA: Diagnosis not present

## 2016-08-21 DIAGNOSIS — Z1389 Encounter for screening for other disorder: Secondary | ICD-10-CM | POA: Diagnosis not present

## 2016-08-21 DIAGNOSIS — E119 Type 2 diabetes mellitus without complications: Secondary | ICD-10-CM | POA: Diagnosis not present

## 2016-08-21 DIAGNOSIS — D513 Other dietary vitamin B12 deficiency anemia: Secondary | ICD-10-CM | POA: Diagnosis not present

## 2016-08-21 DIAGNOSIS — I4891 Unspecified atrial fibrillation: Secondary | ICD-10-CM | POA: Diagnosis not present

## 2016-09-11 ENCOUNTER — Other Ambulatory Visit: Payer: Self-pay | Admitting: Cardiovascular Disease

## 2016-09-23 ENCOUNTER — Other Ambulatory Visit: Payer: Self-pay | Admitting: Cardiovascular Disease

## 2016-10-16 ENCOUNTER — Other Ambulatory Visit: Payer: Self-pay | Admitting: *Deleted

## 2016-10-16 MED ORDER — DILTIAZEM HCL ER COATED BEADS 360 MG PO CP24: 360.0000 mg | ORAL_CAPSULE | Freq: Every day | ORAL | 3 refills | Status: DC

## 2016-10-19 ENCOUNTER — Other Ambulatory Visit: Payer: Self-pay | Admitting: *Deleted

## 2016-10-19 DIAGNOSIS — K219 Gastro-esophageal reflux disease without esophagitis: Secondary | ICD-10-CM

## 2016-10-20 MED ORDER — OMEPRAZOLE 20 MG PO CPDR: DELAYED_RELEASE_CAPSULE | ORAL | 0 refills | Status: DC

## 2016-10-25 ENCOUNTER — Encounter: Payer: Medicare Other | Admitting: *Deleted

## 2016-10-27 ENCOUNTER — Encounter: Payer: Self-pay | Admitting: Cardiology

## 2016-10-27 NOTE — Progress Notes (Signed)
Letter  

## 2016-11-02 DIAGNOSIS — Z79891 Long term (current) use of opiate analgesic: Secondary | ICD-10-CM | POA: Diagnosis not present

## 2016-11-02 DIAGNOSIS — Z79899 Other long term (current) drug therapy: Secondary | ICD-10-CM | POA: Diagnosis not present

## 2016-11-02 DIAGNOSIS — E559 Vitamin D deficiency, unspecified: Secondary | ICD-10-CM | POA: Diagnosis not present

## 2016-11-02 DIAGNOSIS — I10 Essential (primary) hypertension: Secondary | ICD-10-CM | POA: Diagnosis not present

## 2016-11-02 DIAGNOSIS — D513 Other dietary vitamin B12 deficiency anemia: Secondary | ICD-10-CM | POA: Diagnosis not present

## 2016-11-02 DIAGNOSIS — I481 Persistent atrial fibrillation: Secondary | ICD-10-CM | POA: Diagnosis not present

## 2016-11-02 DIAGNOSIS — M25562 Pain in left knee: Secondary | ICD-10-CM | POA: Diagnosis not present

## 2016-11-02 DIAGNOSIS — E119 Type 2 diabetes mellitus without complications: Secondary | ICD-10-CM | POA: Diagnosis not present

## 2016-11-06 ENCOUNTER — Ambulatory Visit (INDEPENDENT_AMBULATORY_CARE_PROVIDER_SITE_OTHER): Payer: Medicare Other | Admitting: *Deleted

## 2016-11-06 DIAGNOSIS — I495 Sick sinus syndrome: Secondary | ICD-10-CM

## 2016-11-07 NOTE — Progress Notes (Signed)
Remote pacemaker transmission.   

## 2016-11-08 ENCOUNTER — Encounter: Payer: Self-pay | Admitting: Cardiology

## 2016-11-09 LAB — CUP PACEART REMOTE DEVICE CHECK
Date Time Interrogation Session: 20180913125126
Implantable Lead Implant Date: 20050526
Implantable Lead Implant Date: 20050526
Implantable Lead Location: 753859
Implantable Lead Model: 5076
Lead Channel Setting Pacing Amplitude: 2.125
Lead Channel Setting Pacing Amplitude: 2.5 V
Lead Channel Setting Pacing Pulse Width: 1 ms
MDC IDC LEAD LOCATION: 753860
MDC IDC PG IMPLANT DT: 20140204
MDC IDC PG SERIAL: 7445049
MDC IDC SET LEADCHNL RV SENSING SENSITIVITY: 1 mV

## 2016-11-13 ENCOUNTER — Other Ambulatory Visit: Payer: Self-pay | Admitting: *Deleted

## 2016-11-13 MED ORDER — FUROSEMIDE 40 MG PO TABS: ORAL_TABLET | ORAL | 2 refills | Status: DC

## 2016-11-13 MED ORDER — METFORMIN HCL 500 MG PO TABS: 500.0000 mg | ORAL_TABLET | Freq: Two times a day (BID) | ORAL | 4 refills | Status: DC

## 2016-11-13 MED ORDER — FLUTICASONE PROPIONATE 50 MCG/ACT NA SUSP: 2.0000 | Freq: Every day | NASAL | 5 refills | Status: DC

## 2016-11-14 ENCOUNTER — Other Ambulatory Visit: Payer: Self-pay | Admitting: *Deleted

## 2016-11-14 MED ORDER — METOPROLOL TARTRATE 100 MG PO TABS: 100.0000 mg | ORAL_TABLET | Freq: Two times a day (BID) | ORAL | 5 refills | Status: DC

## 2016-11-22 DIAGNOSIS — Z23 Encounter for immunization: Secondary | ICD-10-CM | POA: Diagnosis not present

## 2016-11-22 DIAGNOSIS — D72829 Elevated white blood cell count, unspecified: Secondary | ICD-10-CM | POA: Diagnosis not present

## 2016-11-22 DIAGNOSIS — J309 Allergic rhinitis, unspecified: Secondary | ICD-10-CM | POA: Diagnosis not present

## 2016-11-22 DIAGNOSIS — D513 Other dietary vitamin B12 deficiency anemia: Secondary | ICD-10-CM | POA: Diagnosis not present

## 2016-11-22 DIAGNOSIS — R8299 Other abnormal findings in urine: Secondary | ICD-10-CM | POA: Diagnosis not present

## 2017-01-15 ENCOUNTER — Other Ambulatory Visit: Payer: Self-pay | Admitting: Internal Medicine

## 2017-01-15 DIAGNOSIS — K219 Gastro-esophageal reflux disease without esophagitis: Secondary | ICD-10-CM

## 2017-01-16 DIAGNOSIS — J309 Allergic rhinitis, unspecified: Secondary | ICD-10-CM | POA: Diagnosis not present

## 2017-01-16 DIAGNOSIS — N390 Urinary tract infection, site not specified: Secondary | ICD-10-CM | POA: Diagnosis not present

## 2017-01-16 DIAGNOSIS — J029 Acute pharyngitis, unspecified: Secondary | ICD-10-CM | POA: Diagnosis not present

## 2017-02-05 ENCOUNTER — Ambulatory Visit (INDEPENDENT_AMBULATORY_CARE_PROVIDER_SITE_OTHER): Payer: Medicare Other | Admitting: *Deleted

## 2017-02-05 DIAGNOSIS — I495 Sick sinus syndrome: Secondary | ICD-10-CM

## 2017-02-06 NOTE — Progress Notes (Signed)
Remote pacemaker transmission.   

## 2017-02-07 ENCOUNTER — Other Ambulatory Visit: Payer: Self-pay | Admitting: Internal Medicine

## 2017-02-07 MED ORDER — APIXABAN 2.5 MG PO TABS: ORAL_TABLET | ORAL | 11 refills | Status: DC

## 2017-02-07 NOTE — Progress Notes (Signed)
Refill for Eliquis sent in electronically.   Adin Hector, MD, MPH PGY-3 [facility] Pager [phone]

## 2017-02-09 ENCOUNTER — Encounter: Payer: Self-pay | Admitting: Cardiology

## 2017-03-01 LAB — CUP PACEART REMOTE DEVICE CHECK
Brady Statistic AS VP Percent: 13 %
Brady Statistic RA Percent Paced: 49 %
Date Time Interrogation Session: 20181211070006
Implantable Lead Implant Date: 20050526
Implantable Lead Location: 753859
Lead Channel Pacing Threshold Pulse Width: 0.5 ms
Lead Channel Pacing Threshold Pulse Width: 1 ms
Lead Channel Sensing Intrinsic Amplitude: 7.8 mV
Lead Channel Setting Pacing Amplitude: 2.125
Lead Channel Setting Pacing Amplitude: 2.5 V
Lead Channel Setting Pacing Pulse Width: 1 ms
MDC IDC LEAD IMPLANT DT: 20050526
MDC IDC LEAD LOCATION: 753860
MDC IDC MSMT BATTERY REMAINING LONGEVITY: 71 mo
MDC IDC MSMT BATTERY REMAINING PERCENTAGE: 91 %
MDC IDC MSMT BATTERY VOLTAGE: 2.95 V
MDC IDC MSMT LEADCHNL RA IMPEDANCE VALUE: 430 Ohm
MDC IDC MSMT LEADCHNL RA PACING THRESHOLD AMPLITUDE: 1.125 V
MDC IDC MSMT LEADCHNL RA SENSING INTR AMPL: 1.3 mV
MDC IDC MSMT LEADCHNL RV IMPEDANCE VALUE: 380 Ohm
MDC IDC MSMT LEADCHNL RV PACING THRESHOLD AMPLITUDE: 1 V
MDC IDC PG IMPLANT DT: 20140204
MDC IDC SET LEADCHNL RV SENSING SENSITIVITY: 1 mV
MDC IDC STAT BRADY AP VP PERCENT: 50 %
MDC IDC STAT BRADY AP VS PERCENT: 34 %
MDC IDC STAT BRADY AS VS PERCENT: 3 %
MDC IDC STAT BRADY RV PERCENT PACED: 72 %
Pulse Gen Model: 2210
Pulse Gen Serial Number: 7445049

## 2017-04-13 ENCOUNTER — Other Ambulatory Visit: Payer: Self-pay | Admitting: Internal Medicine

## 2017-04-13 DIAGNOSIS — K219 Gastro-esophageal reflux disease without esophagitis: Secondary | ICD-10-CM

## 2017-05-07 ENCOUNTER — Telehealth: Payer: Self-pay | Admitting: Cardiology

## 2017-05-07 ENCOUNTER — Ambulatory Visit (INDEPENDENT_AMBULATORY_CARE_PROVIDER_SITE_OTHER): Payer: Medicare Other | Admitting: *Deleted

## 2017-05-07 DIAGNOSIS — I495 Sick sinus syndrome: Secondary | ICD-10-CM | POA: Diagnosis not present

## 2017-05-07 NOTE — Telephone Encounter (Signed)
Confirmed remote transmission w/ pt daughter.   

## 2017-05-08 DIAGNOSIS — Z79899 Other long term (current) drug therapy: Secondary | ICD-10-CM | POA: Diagnosis not present

## 2017-05-08 DIAGNOSIS — I10 Essential (primary) hypertension: Secondary | ICD-10-CM | POA: Diagnosis not present

## 2017-05-08 DIAGNOSIS — I4891 Unspecified atrial fibrillation: Secondary | ICD-10-CM | POA: Diagnosis not present

## 2017-05-08 DIAGNOSIS — E119 Type 2 diabetes mellitus without complications: Secondary | ICD-10-CM | POA: Diagnosis not present

## 2017-05-09 ENCOUNTER — Encounter: Payer: Self-pay | Admitting: Cardiology

## 2017-05-26 LAB — CUP PACEART REMOTE DEVICE CHECK
Battery Remaining Longevity: 67 mo
Battery Remaining Percentage: 91 %
Battery Voltage: 2.95 V
Brady Statistic AP VS Percent: 37 %
Brady Statistic AS VP Percent: 11 %
Brady Statistic AS VS Percent: 2.5 %
Implantable Lead Implant Date: 20050526
Implantable Lead Model: 5076
Implantable Pulse Generator Implant Date: 20140204
Lead Channel Impedance Value: 400 Ohm
Lead Channel Pacing Threshold Amplitude: 1 V
Lead Channel Pacing Threshold Pulse Width: 1 ms
Lead Channel Sensing Intrinsic Amplitude: 1.4 mV
Lead Channel Sensing Intrinsic Amplitude: 5.2 mV
Lead Channel Setting Pacing Amplitude: 2.25 V
Lead Channel Setting Pacing Amplitude: 2.5 V
Lead Channel Setting Pacing Pulse Width: 1 ms
Lead Channel Setting Sensing Sensitivity: 1 mV
MDC IDC LEAD IMPLANT DT: 20050526
MDC IDC LEAD LOCATION: 753859
MDC IDC LEAD LOCATION: 753860
MDC IDC MSMT LEADCHNL RA PACING THRESHOLD AMPLITUDE: 1.25 V
MDC IDC MSMT LEADCHNL RA PACING THRESHOLD PULSEWIDTH: 0.5 ms
MDC IDC MSMT LEADCHNL RV IMPEDANCE VALUE: 290 Ohm
MDC IDC PG SERIAL: 7445049
MDC IDC SESS DTM: 20190311201708
MDC IDC STAT BRADY AP VP PERCENT: 49 %
MDC IDC STAT BRADY RA PERCENT PACED: 44 %
MDC IDC STAT BRADY RV PERCENT PACED: 69 %

## 2017-06-13 DIAGNOSIS — E119 Type 2 diabetes mellitus without complications: Secondary | ICD-10-CM | POA: Diagnosis not present

## 2017-06-13 DIAGNOSIS — L440 Pityriasis rubra pilaris: Secondary | ICD-10-CM | POA: Diagnosis not present

## 2017-06-23 ENCOUNTER — Other Ambulatory Visit: Payer: Self-pay | Admitting: Internal Medicine

## 2017-07-03 DIAGNOSIS — L440 Pityriasis rubra pilaris: Secondary | ICD-10-CM | POA: Diagnosis not present

## 2017-07-03 DIAGNOSIS — D0461 Carcinoma in situ of skin of right upper limb, including shoulder: Secondary | ICD-10-CM | POA: Diagnosis not present

## 2017-07-06 ENCOUNTER — Other Ambulatory Visit: Payer: Self-pay | Admitting: Cardiovascular Disease

## 2017-07-06 NOTE — Telephone Encounter (Signed)
Rx(s) sent to pharmacy electronically.  

## 2017-07-15 ENCOUNTER — Other Ambulatory Visit: Payer: Self-pay | Admitting: Internal Medicine

## 2017-07-15 DIAGNOSIS — K219 Gastro-esophageal reflux disease without esophagitis: Secondary | ICD-10-CM

## 2017-08-06 ENCOUNTER — Telehealth: Payer: Self-pay

## 2017-08-06 ENCOUNTER — Ambulatory Visit (INDEPENDENT_AMBULATORY_CARE_PROVIDER_SITE_OTHER): Payer: Medicare Other | Admitting: *Deleted

## 2017-08-06 DIAGNOSIS — I495 Sick sinus syndrome: Secondary | ICD-10-CM | POA: Diagnosis not present

## 2017-08-06 NOTE — Telephone Encounter (Signed)
LMOVM reminding pt to send remote transmission.   

## 2017-08-06 NOTE — Telephone Encounter (Signed)
Confirmed remote transmission w/ pt daughter. I helped her send the transmission.

## 2017-08-07 ENCOUNTER — Encounter: Payer: Self-pay | Admitting: Cardiology

## 2017-08-07 NOTE — Progress Notes (Signed)
Remote pacemaker transmission.   

## 2017-08-16 DIAGNOSIS — E119 Type 2 diabetes mellitus without complications: Secondary | ICD-10-CM | POA: Diagnosis not present

## 2017-08-16 DIAGNOSIS — H5203 Hypermetropia, bilateral: Secondary | ICD-10-CM | POA: Diagnosis not present

## 2017-08-16 DIAGNOSIS — H52223 Regular astigmatism, bilateral: Secondary | ICD-10-CM | POA: Diagnosis not present

## 2017-08-17 DIAGNOSIS — E119 Type 2 diabetes mellitus without complications: Secondary | ICD-10-CM | POA: Diagnosis not present

## 2017-08-17 DIAGNOSIS — I10 Essential (primary) hypertension: Secondary | ICD-10-CM | POA: Diagnosis not present

## 2017-08-17 DIAGNOSIS — I4891 Unspecified atrial fibrillation: Secondary | ICD-10-CM | POA: Diagnosis not present

## 2017-08-17 DIAGNOSIS — D513 Other dietary vitamin B12 deficiency anemia: Secondary | ICD-10-CM | POA: Diagnosis not present

## 2017-08-17 DIAGNOSIS — E785 Hyperlipidemia, unspecified: Secondary | ICD-10-CM | POA: Diagnosis not present

## 2017-08-21 LAB — CUP PACEART REMOTE DEVICE CHECK
Brady Statistic AP VP Percent: 53 %
Brady Statistic AP VS Percent: 36 %
Brady Statistic AS VP Percent: 9 %
Brady Statistic AS VS Percent: 2.1 %
Implantable Lead Implant Date: 20050526
Implantable Lead Location: 753859
Implantable Pulse Generator Implant Date: 20140204
Lead Channel Impedance Value: 360 Ohm
Lead Channel Impedance Value: 400 Ohm
Lead Channel Pacing Threshold Amplitude: 1 V
Lead Channel Pacing Threshold Pulse Width: 1 ms
Lead Channel Setting Pacing Amplitude: 2.5 V
Lead Channel Setting Pacing Pulse Width: 1 ms
Lead Channel Setting Sensing Sensitivity: 1 mV
MDC IDC LEAD IMPLANT DT: 20050526
MDC IDC LEAD LOCATION: 753860
MDC IDC MSMT BATTERY REMAINING LONGEVITY: 64 mo
MDC IDC MSMT BATTERY REMAINING PERCENTAGE: 81 %
MDC IDC MSMT BATTERY VOLTAGE: 2.93 V
MDC IDC MSMT LEADCHNL RA PACING THRESHOLD AMPLITUDE: 1 V
MDC IDC MSMT LEADCHNL RA PACING THRESHOLD PULSEWIDTH: 0.5 ms
MDC IDC MSMT LEADCHNL RA SENSING INTR AMPL: 1.2 mV
MDC IDC MSMT LEADCHNL RV SENSING INTR AMPL: 8.8 mV
MDC IDC SESS DTM: 20190610204534
MDC IDC SET LEADCHNL RA PACING AMPLITUDE: 2 V
MDC IDC STAT BRADY RA PERCENT PACED: 42 %
MDC IDC STAT BRADY RV PERCENT PACED: 68 %
Pulse Gen Model: 2210
Pulse Gen Serial Number: 7445049

## 2017-09-04 ENCOUNTER — Ambulatory Visit (INDEPENDENT_AMBULATORY_CARE_PROVIDER_SITE_OTHER): Payer: Medicare Other | Admitting: Cardiovascular Disease

## 2017-09-04 ENCOUNTER — Encounter: Payer: Self-pay | Admitting: Cardiovascular Disease

## 2017-09-04 VITALS — BP 138/70 | HR 70 | Ht 64.0 in | Wt 152.0 lb

## 2017-09-04 DIAGNOSIS — I495 Sick sinus syndrome: Secondary | ICD-10-CM | POA: Diagnosis not present

## 2017-09-04 DIAGNOSIS — Z952 Presence of prosthetic heart valve: Secondary | ICD-10-CM | POA: Diagnosis not present

## 2017-09-04 DIAGNOSIS — I2510 Atherosclerotic heart disease of native coronary artery without angina pectoris: Secondary | ICD-10-CM

## 2017-09-04 DIAGNOSIS — Z950 Presence of cardiac pacemaker: Secondary | ICD-10-CM | POA: Diagnosis not present

## 2017-09-04 DIAGNOSIS — I481 Persistent atrial fibrillation: Secondary | ICD-10-CM | POA: Diagnosis not present

## 2017-09-04 DIAGNOSIS — I10 Essential (primary) hypertension: Secondary | ICD-10-CM | POA: Diagnosis not present

## 2017-09-04 DIAGNOSIS — I4819 Other persistent atrial fibrillation: Secondary | ICD-10-CM

## 2017-09-04 DIAGNOSIS — E7800 Pure hypercholesterolemia, unspecified: Secondary | ICD-10-CM | POA: Diagnosis not present

## 2017-09-04 DIAGNOSIS — Z7901 Long term (current) use of anticoagulants: Secondary | ICD-10-CM

## 2017-09-04 DIAGNOSIS — N184 Chronic kidney disease, stage 4 (severe): Secondary | ICD-10-CM

## 2017-09-04 MED ORDER — DILTIAZEM HCL ER COATED BEADS 240 MG PO CP24: 240.0000 mg | ORAL_CAPSULE | Freq: Every day | ORAL | 3 refills | Status: DC

## 2017-09-04 NOTE — Progress Notes (Signed)
Patient ID: [name], female   DOB: [birth year], 84 y.o.   MRN: [id number]     Cardiology Office Note    Date:  09/06/2017   ID:  [name], DOB January 21, 1930, MRN [id number]  PCP:  Imagene Riches, NP  Cardiologist:   Sanda Klein, MD   Chief Complaint  Patient presents with  . Follow-up    12 months    History of Present Illness:  [name] is a 84 y.o. female with a history of paroxysmal atrial fibrillation on chronic anticoagulation, sinus node dysfunction s/p dual chamber pacemaker (leads 2005, generator change 2014 [employer]), valvular heart disease with a biological aortic valve prosthesis (21 mm, 2005, Dr. Servando Snare), severe hypercholesterolemia. She has not had clinical coronary disease, but preoperative cardiac catheterization in 2005 showed 40% LAD stenosis.   The patient specifically denies any chest pain at rest exertion, dyspnea at rest or with exertion, orthopnea, paroxysmal nocturnal dyspnea, syncope, palpitations, focal neurological deficits, intermittent claudication,unexplained weight gain, cough, hemoptysis or wheezing.  She has mild symmetrical ankle swelling, no more than 2+  Pacemaker interrogation shows normal device function. She has a Insurance claims handler implanted in 2014. Atrial lead parameters are excellent. Ventricular lead threshold is slightly elevated chronically. Estimated generator longevity is 5-7 years. She paces the atrium 53% of the time and paces the ventricle 68 % of the time.  She appears to pace the ventricle more frequently when she is in atrial fibrillation  Overall burden of atrial fibrillation over the past year is about 50%.  She was in uninterrupted atrial fibrillation throughout all of February March and April.  She went back into atrial fibrillation just a few days before her last device download.  She appears to be oblivious to the arrhythmia.  He can tell a difference symptomatically.  She is in atrial fibrillation  today with mostly ventricular paced beats and occasional fusion beats  Her most recent echocardiogram was performed in October 2017.  The ventricular systolic function is normal, gradients across aortic valve prosthesis (peak 24, mean 13) are normal and she has trivial central aortic insufficiency.  She has not been able to tolerate statins after trying multiple different agents.  Labs performed just a few weeks ago showed severe hypercholesterolemia.  Total cholesterol  286, LDL 195 (direct), triglycerides 142, HDL 50.  Borderline hemoglobin A1c 6.1%, hemoglobin 12.3, creatinine 1.79, BUN 30, normal liver function tests, potassium 4.1 Past Medical History:  Diagnosis Date  . Anemia   . Anxiety   . CAD (coronary artery disease)   . CHF (congestive heart failure) (HCC)    EF 55% on 01/05/11, trace AR w/ Bioprosthetic valve  . DM2 (diabetes mellitus, type 2) ([place])   . GERD (gastroesophageal reflux disease)   . HLD (hyperlipidemia)   . HTN (hypertension)   . PAF (paroxysmal atrial fibrillation) ([place])   . Restless legs syndrome (RLS)   . Tobacco abuse     Past Surgical History:  Procedure Laterality Date  . AORTIC VALVE REPLACEMENT  08/25/2002   tissue valve  . CARDIOVERSION N/A 05/05/2014   Procedure: CARDIOVERSION;  Surgeon: Dorothy Spark, MD;  Location: [facility];  Service: Cardiovascular;  Laterality: N/A;  . PACEMAKER INSERTION  04/02/2012   [employer]  . PERMANENT PACEMAKER GENERATOR CHANGE N/A 04/02/2012   Procedure: PERMANENT PACEMAKER GENERATOR CHANGE;  Surgeon: Sanda Klein, MD;  Location: [facility];  Service: Cardiovascular;  Laterality: N/A;    Outpatient Medications Prior to  Visit  Medication Sig Dispense Refill  . ALPRAZolam (XANAX) 1 MG tablet Take 0.5 tablets (0.5 mg total) by mouth 2 (two) times daily as needed for anxiety. 60 tablet 0  . apixaban (ELIQUIS) 2.5 MG TABS tablet TAKE 1 TABLET (2.5 MG TOTAL) BY MOUTH 2 (TWO) TIMES DAILY. 60 tablet 11  . benazepril  (LOTENSIN) 20 MG tablet TAKE 1 TABLET (20 MG TOTAL) BY MOUTH 2 (TWO) TIMES DAILY. 180 tablet 0  . Blood Glucose Monitoring Suppl (ONE TOUCH ULTRA 2) W/DEVICE KIT   0  . ezetimibe (ZETIA) 10 MG tablet TAKE 1 TABLET (10 MG TOTAL) BY MOUTH DAILY. 90 tablet 3  . ferrous sulfate 325 (65 FE) MG tablet Take 1 tablet (325 mg total) by mouth 2 (two) times daily with a meal. (Patient taking differently: Take 650 mg by mouth daily with breakfast. ) 60 tablet 3  . fluticasone (FLONASE) 50 MCG/ACT nasal spray Place 2 sprays into both nostrils daily. 16 g 5  . furosemide (LASIX) 40 MG tablet TAKE 1 TABLET BY MOUTH TWICE A DAY AS NEEDED FOR SWELLING OR SHORTNESS OF BREATH 60 tablet 2  . metFORMIN (GLUCOPHAGE) 500 MG tablet TAKE 1 TABLET (500 MG TOTAL) BY MOUTH 2 (TWO) TIMES DAILY WITH A MEAL. 180 tablet 3  . metoprolol tartrate (LOPRESSOR) 100 MG tablet Take 1 tablet (100 mg total) by mouth 2 (two) times daily. 60 tablet 5  . omeprazole (PRILOSEC) 20 MG capsule TAKE 1 CAPSULE BY MOUTH EVERY DAY 90 capsule 0  . ONE TOUCH ULTRA TEST test strip TEST BLOOD SUGAR TWICE A DAY 50 each 4  . diltiazem (CARDIZEM CD) 360 MG 24 hr capsule Take 1 capsule (360 mg total) by mouth daily. 90 capsule 3   No facility-administered medications prior to visit.      Allergies:   Pravachol [pravastatin sodium] and Tape   Social History   Socioeconomic History  . Marital status: Widowed    Spouse name: Not on file  . Number of children: 5  . Years of education: 5  . Highest education level: Not on file  Occupational History  . Occupation: Museum/gallery exhibitions officer: RETIRED  Social Needs  . Financial resource strain: Not on file  . Food insecurity:    Worry: Not on file    Inability: Not on file  . Transportation needs:    Medical: Not on file    Non-medical: Not on file  Tobacco Use  . Smoking status: Never Smoker  . Smokeless tobacco: Current User    Types: Snuff  Substance and Sexual Activity  . Alcohol use: No      Alcohol/week: 0.0 oz  . Drug use: No  . Sexual activity: Not on file  Lifestyle  . Physical activity:    Days per week: Not on file    Minutes per session: Not on file  . Stress: Not on file  Relationships  . Social connections:    Talks on phone: Not on file    Gets together: Not on file    Attends religious service: Not on file    Active member of club or organization: Not on file    Attends meetings of clubs or organizations: Not on file    Relationship status: Not on file  Other Topics Concern  . Not on file  Social History Narrative   Health Care POA:    Emergency Contact: daughter, [name] [phone]   End of Life Plan: gave pt ad pamphlet  Who lives with you: daughter and 2 grandchildren   Any pets: 2 dogs   Diet: pt has a varied diet of protein, starch and vegetables.   Exercise: pt garden's daily   Seatbelts: pt does not wear seatbelt because of pace maker   Sun Exposure/Protection: pt does not use sun protection, has irregular dermatology appointments.   Hobbies: gardening, sitting on porch.           Family History:  The patient's family history includes Heart attack in her brother; Kidney disease in her sister.   ROS:   Please see the history of present illness.    ROS The patient specifically denies any chest pain at rest exertion, dyspnea at rest or with exertion, orthopnea, paroxysmal nocturnal dyspnea, syncope, palpitations, focal neurological deficits, intermittent claudication, unexplained weight gain, cough, hemoptysis or wheezing. All other systems are reviewed and are negative   PHYSICAL EXAM:   VS:  BP 138/70 (BP Location: Left Arm, Patient Position: Sitting, Cuff Size: Normal)   Pulse 70   Ht '5\' 4"'  (1.626 m)   Wt 152 lb (68.9 kg)   BMI 26.09 kg/m       General: Alert, oriented x3, no distress, lean, appears comfortable Head: no evidence of trauma, PERRL, EOMI, no exophtalmos or lid lag, no myxedema, no xanthelasma; normal ears, nose  and oropharynx Neck: normal jugular venous pulsations and no hepatojugular reflux; brisk carotid pulses without delay and no carotid bruits Chest: clear to auscultation, no signs of consolidation by percussion or palpation, normal fremitus, symmetrical and full respiratory excursions Cardiovascular: normal position and quality of the apical impulse, irregular rhythm, normal first and second heart sounds, 3/6 early-mid peaking systolic ejection murmur in the aortic focus, no rubs or gallops. Healthy left subclavian pacemaker site.  Abdomen: no tenderness or distention, no masses by palpation, no abnormal pulsatility or arterial bruits, normal bowel sounds, no hepatosplenomegaly Extremities: no clubbing, cyanosis; symmetrical 2+ ankle edema; 2+ radial, ulnar and brachial pulses bilaterally; 2+ right femoral, posterior tibial and dorsalis pedis pulses; 2+ left femoral, posterior tibial and dorsalis pedis pulses; no subclavian or femoral bruits Neurological: grossly nonfocal Psych: Normal mood and affect   Wt Readings from Last 3 Encounters:  09/04/17 152 lb (68.9 kg)  07/26/16 140 lb 12.8 oz (63.9 kg)  04/04/16 132 lb 3.2 oz (60 kg)      Studies/Labs Reviewed:  H EKG:  EKG is  ordered today.   This shows atrial fibrillation with mostly ventricular paced rhythm, and occasional, QRS (paced) 196 ms, QTC 529 ms  The ekg ordered today demonstrates Coarse atrial fibrillation with occasional ventricular pacing. Then natively conducted QRS complexes are followed by deep T-wave inversion in leads V4-V6 and in the inferior leads, possibly "memory T waves".  Recent Labs: No results found for requested labs within last 8760 hours.   Lipid Panel    Component Value Date/Time   CHOL 305 (H) 04/06/2009 2033   TRIG 203 (H) 04/06/2009 2033   HDL 45 04/06/2009 2033   CHOLHDL 6.8 Ratio 04/06/2009 2033   VLDL 41 (H) 04/06/2009 2033   LDLCALC 219 (H) 04/06/2009 2033   LDLDIRECT 141 (H) 06/14/2015 1507      ASSESSMENT:    1. Persistent atrial fibrillation ([place])   2. SSS (sick sinus syndrome) ([place])   3. Pacemaker   4. Coronary artery disease involving native coronary artery of native heart without angina pectoris   5. Hypercholesterolemia   6. Essential hypertension   7. S/P  AVR (aortic valve replacement)   8. Long term current use of anticoagulant   9. CKD (chronic kidney disease) stage 4, GFR 15-29 ml/min (HCC)      PLAN:  In order of problems listed above:  1. Atrial flutter/atrial fibrillation: She has had sustained persistent atrial fibrillation for over 3 months at a time, but intermittently goes back to normal rhythm.  She just went back into atrial fibrillation a few days before this appointment.  It does not appear to cause any change in symptoms.  She is compliant with anticoagulation and has not had bleeding.  High ventricular rates are not an issue.  CHADSVasc score 6 (age 76, gender, HBP, vascular disease, DM).  Antiarrhythmics and cardioversion but do not appear to be justified. 2. SSS: less atrial pacing due to long periods of atrial fibrillation, but ventricular pacing has increased. 3. Pacemaker.  Ventricular pacing has increased quite substantially, seemingly because the rate is slower during atrial fibrillation.  We will decrease the dose of diltiazem.  Normal device function.  Continue remote downloads every 3 months and yearly office visits 4. CAD: She does not have angina pectoris. Minor coronary artery disease by cardiac catheterization in 2005.  5. 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  Her daughter [name] has a very similar lipid profile.  Unfortunately they are both statin intolerant.  We will try to see if we can get PCSK9 inhibitors.   6. DM:  A1c 6.1%  7. HTN: Acceptable blood pressure control.  Reports that when checked with [facility], her systolic blood pressure was less than 130. 8. AVR: Her murmur is  substantially louder and more mid peaking than I remember it.  We will recheck an echocardiogram. 9. Chronic apixaban anticoagulation: She was very anemic about a year ago, but hemoglobin is normal at 12.3, although we have continued anticoagulation.  She does not have any overt bleeding. 10. CKD: Baseline GFR approximately 25 mL/minute based on the most recent tests.  She is on reduced dose Eliquis    Medication Adjustments/Labs and Tests Ordered: Current medicines are reviewed at length with the patient today.  Concerns regarding medicines are outlined above.  Medication changes, Labs and Tests ordered today are listed in the Patient Instructions below. Patient Instructions  Medication Instructions: Dr Sallyanne Kuster has recommended making the following medication changes: 1. DECREASE Diltiazem to 240 mg daily  Your physician has requested that you regularly monitor your blood pressure at home. Please use the same machine to check your blood pressure daily. Keep a record of your blood pressures using the log sheet provided. In 2 weeks, please report your readings back to Dr C. You may use our online patient portal 'MyChart' or you can call the office to speak with a nurse.  One of our clinical pharmacists will contact you about Repatha.  Labwork: NONE ORDERED  We will request labs from Heide Scales, NP.  Testing/Procedures: 1. Echocardiogram - Your physician has requested that you have an echocardiogram. Echocardiography is a painless test that uses sound waves to create images of your heart. It provides your doctor with information about the size and shape of your heart and how well your heart's chambers and valves are working. This procedure takes approximately one hour. There are no restrictions for this procedure. >>This will be performed at our [facility] location [place], [place] [address] [phone]  2. Remote Device Transmission - Remote monitoring is used to  monitor your  Pacemaker or ICD from home. This monitoring reduces the number of office visits required to check your device to one time per year. It allows [place] to keep an eye on the functioning of your device to ensure it is working properly. You are scheduled for a device check from home on Monday, September 9th, 2019. You may send your transmission at any time that day. If you have a wireless device, the transmission will be sent automatically. After your physician reviews your transmission, you will receive a notification with your next transmission date.  To improve our patient care and to more adequately follow your device, [facility] has decided, as a practice, to start following each patient four times a year with your home monitor. This means that you may experience a remote appointment that is close to an in-office appointment with your physician. Your insurance will apply at the same rate as other remote monitoring transmissions.  Follow-up: Dr Sallyanne Kuster recommends that you schedule a follow-up appointment in 12 months. You will receive a reminder letter in the mail two months in advance. If you don't receive a letter, please call our office to schedule the follow-up appointment.  If you need a refill on your cardiac medications before your next appointment, please call your pharmacy.      Signed, Sanda Klein, MD  09/06/2017 4:48 PM    [address], [place], [place]  [address] Phone: [phone]; Fax: [phone]

## 2017-09-04 NOTE — Patient Instructions (Signed)
Medication Instructions: Dr Sallyanne Kuster has recommended making the following medication changes: 1. DECREASE Diltiazem to 240 mg daily  Your physician has requested that you regularly monitor your blood pressure at home. Please use the same machine to check your blood pressure daily. Keep a record of your blood pressures using the log sheet provided. In 2 weeks, please report your readings back to Dr C. You may use our online patient portal 'MyChart' or you can call the office to speak with a nurse.  One of our clinical pharmacists will contact you about Repatha.  Labwork: NONE ORDERED  We will request labs from Heide Scales, NP.  Testing/Procedures: 1. Echocardiogram - Your physician has requested that you have an echocardiogram. Echocardiography is a painless test that uses sound waves to create images of your heart. It provides your doctor with information about the size and shape of your heart and how well your heart's chambers and valves are working. This procedure takes approximately one hour. There are no restrictions for this procedure. >>This will be performed at our [facility] location [place], [place] [address] [phone]  2. Remote Device Transmission - Remote monitoring is used to monitor your Pacemaker or ICD from home. This monitoring reduces the number of office visits required to check your device to one time per year. It allows [place] to keep an eye on the functioning of your device to ensure it is working properly. You are scheduled for a device check from home on Monday, September 9th, 2019. You may send your transmission at any time that day. If you have a wireless device, the transmission will be sent automatically. After your physician reviews your transmission, you will receive a notification with your next transmission date.  To improve our patient care and to more adequately follow your device, [facility] has decided, as a practice, to start following  each patient four times a year with your home monitor. This means that you may experience a remote appointment that is close to an in-office appointment with your physician. Your insurance will apply at the same rate as other remote monitoring transmissions.  Follow-up: Dr Sallyanne Kuster recommends that you schedule a follow-up appointment in 12 months. You will receive a reminder letter in the mail two months in advance. If you don't receive a letter, please call our office to schedule the follow-up appointment.  If you need a refill on your cardiac medications before your next appointment, please call your pharmacy.

## 2017-09-06 DIAGNOSIS — R609 Edema, unspecified: Secondary | ICD-10-CM | POA: Diagnosis not present

## 2017-09-06 DIAGNOSIS — R799 Abnormal finding of blood chemistry, unspecified: Secondary | ICD-10-CM | POA: Diagnosis not present

## 2017-09-06 DIAGNOSIS — E785 Hyperlipidemia, unspecified: Secondary | ICD-10-CM | POA: Diagnosis not present

## 2017-09-06 DIAGNOSIS — N399 Disorder of urinary system, unspecified: Secondary | ICD-10-CM | POA: Diagnosis not present

## 2017-09-06 DIAGNOSIS — M545 Low back pain: Secondary | ICD-10-CM | POA: Diagnosis not present

## 2017-09-13 DIAGNOSIS — H5213 Myopia, bilateral: Secondary | ICD-10-CM | POA: Diagnosis not present

## 2017-09-14 ENCOUNTER — Ambulatory Visit (HOSPITAL_COMMUNITY): Payer: Medicare Other | Attending: Cardiology

## 2017-09-14 ENCOUNTER — Other Ambulatory Visit: Payer: Self-pay

## 2017-09-14 DIAGNOSIS — I4892 Unspecified atrial flutter: Secondary | ICD-10-CM | POA: Diagnosis not present

## 2017-09-14 DIAGNOSIS — Z952 Presence of prosthetic heart valve: Secondary | ICD-10-CM

## 2017-09-14 DIAGNOSIS — E1122 Type 2 diabetes mellitus with diabetic chronic kidney disease: Secondary | ICD-10-CM | POA: Diagnosis not present

## 2017-09-14 DIAGNOSIS — I2510 Atherosclerotic heart disease of native coronary artery without angina pectoris: Secondary | ICD-10-CM | POA: Diagnosis not present

## 2017-09-14 DIAGNOSIS — N183 Chronic kidney disease, stage 3 (moderate): Secondary | ICD-10-CM | POA: Insufficient documentation

## 2017-09-14 DIAGNOSIS — I130 Hypertensive heart and chronic kidney disease with heart failure and stage 1 through stage 4 chronic kidney disease, or unspecified chronic kidney disease: Secondary | ICD-10-CM | POA: Insufficient documentation

## 2017-09-14 DIAGNOSIS — D649 Anemia, unspecified: Secondary | ICD-10-CM | POA: Diagnosis not present

## 2017-09-14 DIAGNOSIS — E785 Hyperlipidemia, unspecified: Secondary | ICD-10-CM | POA: Diagnosis not present

## 2017-09-14 DIAGNOSIS — Z720 Tobacco use: Secondary | ICD-10-CM | POA: Diagnosis not present

## 2017-09-14 DIAGNOSIS — I495 Sick sinus syndrome: Secondary | ICD-10-CM | POA: Diagnosis not present

## 2017-09-14 DIAGNOSIS — Z48812 Encounter for surgical aftercare following surgery on the circulatory system: Secondary | ICD-10-CM | POA: Insufficient documentation

## 2017-09-14 DIAGNOSIS — I4891 Unspecified atrial fibrillation: Secondary | ICD-10-CM | POA: Insufficient documentation

## 2017-09-14 DIAGNOSIS — I509 Heart failure, unspecified: Secondary | ICD-10-CM | POA: Diagnosis not present

## 2017-09-14 DIAGNOSIS — I359 Nonrheumatic aortic valve disorder, unspecified: Secondary | ICD-10-CM | POA: Diagnosis present

## 2017-09-14 DIAGNOSIS — I081 Rheumatic disorders of both mitral and tricuspid valves: Secondary | ICD-10-CM | POA: Insufficient documentation

## 2017-09-20 DIAGNOSIS — E119 Type 2 diabetes mellitus without complications: Secondary | ICD-10-CM | POA: Diagnosis not present

## 2017-09-20 DIAGNOSIS — I4891 Unspecified atrial fibrillation: Secondary | ICD-10-CM | POA: Diagnosis not present

## 2017-09-20 DIAGNOSIS — R799 Abnormal finding of blood chemistry, unspecified: Secondary | ICD-10-CM | POA: Diagnosis not present

## 2017-09-20 DIAGNOSIS — I10 Essential (primary) hypertension: Secondary | ICD-10-CM | POA: Diagnosis not present

## 2017-09-20 DIAGNOSIS — R82998 Other abnormal findings in urine: Secondary | ICD-10-CM | POA: Diagnosis not present

## 2017-09-20 DIAGNOSIS — M25562 Pain in left knee: Secondary | ICD-10-CM | POA: Diagnosis not present

## 2017-09-24 ENCOUNTER — Encounter: Payer: Self-pay | Admitting: Pharmacist Clinician (PhC)/ Clinical Pharmacy Specialist

## 2017-10-06 ENCOUNTER — Other Ambulatory Visit: Payer: Self-pay | Admitting: Cardiovascular Disease

## 2017-10-13 ENCOUNTER — Other Ambulatory Visit: Payer: Self-pay | Admitting: Internal Medicine

## 2017-10-13 DIAGNOSIS — K219 Gastro-esophageal reflux disease without esophagitis: Secondary | ICD-10-CM

## 2017-10-25 DIAGNOSIS — H5203 Hypermetropia, bilateral: Secondary | ICD-10-CM | POA: Diagnosis not present

## 2017-10-25 DIAGNOSIS — H52223 Regular astigmatism, bilateral: Secondary | ICD-10-CM | POA: Diagnosis not present

## 2017-10-29 ENCOUNTER — Other Ambulatory Visit: Payer: Self-pay | Admitting: Cardiovascular Disease

## 2017-11-05 ENCOUNTER — Telehealth: Payer: Self-pay

## 2017-11-05 ENCOUNTER — Ambulatory Visit (INDEPENDENT_AMBULATORY_CARE_PROVIDER_SITE_OTHER): Payer: Medicare Other | Admitting: *Deleted

## 2017-11-05 DIAGNOSIS — I495 Sick sinus syndrome: Secondary | ICD-10-CM | POA: Diagnosis not present

## 2017-11-05 DIAGNOSIS — I4819 Other persistent atrial fibrillation: Secondary | ICD-10-CM

## 2017-11-05 NOTE — Telephone Encounter (Signed)
LMOVM reminding pt to send remote transmission.   

## 2017-11-06 ENCOUNTER — Encounter: Payer: Self-pay | Admitting: Cardiology

## 2017-11-08 NOTE — Progress Notes (Signed)
Remote pacemaker transmission.   

## 2017-11-27 LAB — CUP PACEART REMOTE DEVICE CHECK
Battery Remaining Longevity: 64 mo
Battery Remaining Percentage: 81 %
Brady Statistic AP VP Percent: 48 %
Brady Statistic AP VS Percent: 43 %
Brady Statistic AS VS Percent: 1.8 %
Brady Statistic RV Percent Paced: 65 %
Implantable Lead Implant Date: 20050526
Implantable Lead Implant Date: 20050526
Implantable Lead Location: 753859
Implantable Lead Location: 753860
Implantable Pulse Generator Implant Date: 20140204
Lead Channel Impedance Value: 350 Ohm
Lead Channel Pacing Threshold Amplitude: 1 V
Lead Channel Pacing Threshold Amplitude: 1.5 V
Lead Channel Pacing Threshold Pulse Width: 0.5 ms
Lead Channel Pacing Threshold Pulse Width: 1 ms
Lead Channel Sensing Intrinsic Amplitude: 1.2 mV
Lead Channel Setting Pacing Amplitude: 2.5 V
Lead Channel Setting Pacing Amplitude: 2.5 V
Lead Channel Setting Sensing Sensitivity: 1 mV
MDC IDC MSMT BATTERY VOLTAGE: 2.93 V
MDC IDC MSMT LEADCHNL RA IMPEDANCE VALUE: 390 Ohm
MDC IDC MSMT LEADCHNL RV SENSING INTR AMPL: 9.5 mV
MDC IDC PG SERIAL: 7445049
MDC IDC SESS DTM: 20190912172052
MDC IDC SET LEADCHNL RV PACING PULSEWIDTH: 1 ms
MDC IDC STAT BRADY AS VP PERCENT: 7.6 %
MDC IDC STAT BRADY RA PERCENT PACED: 42 %
Pulse Gen Model: 2210

## 2017-11-28 ENCOUNTER — Other Ambulatory Visit: Payer: Self-pay | Admitting: Internal Medicine

## 2017-12-20 DIAGNOSIS — M170 Bilateral primary osteoarthritis of knee: Secondary | ICD-10-CM | POA: Diagnosis not present

## 2017-12-20 DIAGNOSIS — I4891 Unspecified atrial fibrillation: Secondary | ICD-10-CM | POA: Diagnosis not present

## 2017-12-20 DIAGNOSIS — N399 Disorder of urinary system, unspecified: Secondary | ICD-10-CM | POA: Diagnosis not present

## 2017-12-20 DIAGNOSIS — Z79891 Long term (current) use of opiate analgesic: Secondary | ICD-10-CM | POA: Diagnosis not present

## 2017-12-20 DIAGNOSIS — Z79899 Other long term (current) drug therapy: Secondary | ICD-10-CM | POA: Diagnosis not present

## 2017-12-20 DIAGNOSIS — I10 Essential (primary) hypertension: Secondary | ICD-10-CM | POA: Diagnosis not present

## 2017-12-20 DIAGNOSIS — E119 Type 2 diabetes mellitus without complications: Secondary | ICD-10-CM | POA: Diagnosis not present

## 2017-12-20 DIAGNOSIS — Z23 Encounter for immunization: Secondary | ICD-10-CM | POA: Diagnosis not present

## 2018-02-04 ENCOUNTER — Ambulatory Visit (INDEPENDENT_AMBULATORY_CARE_PROVIDER_SITE_OTHER): Payer: Medicare Other

## 2018-02-04 DIAGNOSIS — I4819 Other persistent atrial fibrillation: Secondary | ICD-10-CM | POA: Diagnosis not present

## 2018-02-04 DIAGNOSIS — I495 Sick sinus syndrome: Secondary | ICD-10-CM

## 2018-02-06 NOTE — Progress Notes (Signed)
Remote pacemaker transmission.   

## 2018-03-13 DIAGNOSIS — R82998 Other abnormal findings in urine: Secondary | ICD-10-CM | POA: Diagnosis not present

## 2018-03-13 DIAGNOSIS — M545 Low back pain: Secondary | ICD-10-CM | POA: Diagnosis not present

## 2018-03-18 DIAGNOSIS — M545 Low back pain: Secondary | ICD-10-CM | POA: Diagnosis not present

## 2018-03-23 LAB — CUP PACEART REMOTE DEVICE CHECK
Battery Remaining Percentage: 73 %
Battery Voltage: 2.92 V
Brady Statistic AP VP Percent: 46 %
Brady Statistic AS VS Percent: 1.6 %
Brady Statistic RA Percent Paced: 50 %
Brady Statistic RV Percent Paced: 62 %
Implantable Lead Implant Date: 20050526
Implantable Lead Location: 753859
Implantable Lead Model: 5076
Implantable Pulse Generator Implant Date: 20140204
Lead Channel Impedance Value: 360 Ohm
Lead Channel Pacing Threshold Amplitude: 1.375 V
Lead Channel Pacing Threshold Pulse Width: 0.5 ms
Lead Channel Pacing Threshold Pulse Width: 1 ms
Lead Channel Sensing Intrinsic Amplitude: 1.1 mV
Lead Channel Setting Pacing Amplitude: 2.5 V
MDC IDC LEAD IMPLANT DT: 20050526
MDC IDC LEAD LOCATION: 753860
MDC IDC MSMT BATTERY REMAINING LONGEVITY: 58 mo
MDC IDC MSMT LEADCHNL RA IMPEDANCE VALUE: 450 Ohm
MDC IDC MSMT LEADCHNL RV PACING THRESHOLD AMPLITUDE: 1 V
MDC IDC MSMT LEADCHNL RV SENSING INTR AMPL: 5.4 mV
MDC IDC PG SERIAL: 7445049
MDC IDC SESS DTM: 20191210070006
MDC IDC SET LEADCHNL RA PACING AMPLITUDE: 2.375
MDC IDC SET LEADCHNL RV PACING PULSEWIDTH: 1 ms
MDC IDC SET LEADCHNL RV SENSING SENSITIVITY: 1 mV
MDC IDC STAT BRADY AP VS PERCENT: 47 %
MDC IDC STAT BRADY AS VP PERCENT: 5.6 %

## 2018-05-06 ENCOUNTER — Ambulatory Visit (INDEPENDENT_AMBULATORY_CARE_PROVIDER_SITE_OTHER): Payer: Medicare Other | Admitting: *Deleted

## 2018-05-06 DIAGNOSIS — I495 Sick sinus syndrome: Secondary | ICD-10-CM | POA: Diagnosis not present

## 2018-05-06 DIAGNOSIS — I4819 Other persistent atrial fibrillation: Secondary | ICD-10-CM

## 2018-05-07 LAB — CUP PACEART REMOTE DEVICE CHECK
Brady Statistic AP VS Percent: 51 %
Brady Statistic AS VP Percent: 4.7 %
Brady Statistic AS VS Percent: 1.7 %
Date Time Interrogation Session: 20200309222749
Implantable Lead Implant Date: 20050526
Implantable Lead Location: 753859
Implantable Pulse Generator Implant Date: 20140204
Lead Channel Pacing Threshold Amplitude: 1 V
Lead Channel Pacing Threshold Pulse Width: 1 ms
Lead Channel Sensing Intrinsic Amplitude: 7.5 mV
Lead Channel Setting Pacing Amplitude: 2.375
Lead Channel Setting Pacing Amplitude: 2.5 V
Lead Channel Setting Pacing Pulse Width: 1 ms
MDC IDC LEAD IMPLANT DT: 20050526
MDC IDC LEAD LOCATION: 753860
MDC IDC MSMT BATTERY REMAINING LONGEVITY: 51 mo
MDC IDC MSMT BATTERY REMAINING PERCENTAGE: 65 %
MDC IDC MSMT BATTERY VOLTAGE: 2.9 V
MDC IDC MSMT LEADCHNL RA IMPEDANCE VALUE: 440 Ohm
MDC IDC MSMT LEADCHNL RA PACING THRESHOLD AMPLITUDE: 1.375 V
MDC IDC MSMT LEADCHNL RA PACING THRESHOLD PULSEWIDTH: 0.5 ms
MDC IDC MSMT LEADCHNL RA SENSING INTR AMPL: 0.9 mV
MDC IDC MSMT LEADCHNL RV IMPEDANCE VALUE: 350 Ohm
MDC IDC SET LEADCHNL RV SENSING SENSITIVITY: 1 mV
MDC IDC STAT BRADY AP VP PERCENT: 43 %
MDC IDC STAT BRADY RA PERCENT PACED: 56 %
MDC IDC STAT BRADY RV PERCENT PACED: 58 %
Pulse Gen Model: 2210
Pulse Gen Serial Number: 7445049

## 2018-05-14 NOTE — Progress Notes (Signed)
Remote pacemaker transmission.   

## 2018-06-21 DIAGNOSIS — R0982 Postnasal drip: Secondary | ICD-10-CM | POA: Diagnosis not present

## 2018-06-21 DIAGNOSIS — H1089 Other conjunctivitis: Secondary | ICD-10-CM | POA: Diagnosis not present

## 2018-08-05 ENCOUNTER — Ambulatory Visit (INDEPENDENT_AMBULATORY_CARE_PROVIDER_SITE_OTHER): Payer: Medicare Other | Admitting: *Deleted

## 2018-08-05 DIAGNOSIS — I495 Sick sinus syndrome: Secondary | ICD-10-CM

## 2018-08-05 DIAGNOSIS — I4819 Other persistent atrial fibrillation: Secondary | ICD-10-CM

## 2018-08-05 LAB — CUP PACEART REMOTE DEVICE CHECK
Battery Remaining Longevity: 51 mo
Battery Remaining Percentage: 65 %
Battery Voltage: 2.9 V
Brady Statistic AP VP Percent: 39 %
Brady Statistic AP VS Percent: 53 %
Brady Statistic AS VP Percent: 4.4 %
Brady Statistic AS VS Percent: 2.7 %
Brady Statistic RA Percent Paced: 58 %
Brady Statistic RV Percent Paced: 55 %
Date Time Interrogation Session: 20200608125716
Implantable Lead Implant Date: 20050526
Implantable Lead Implant Date: 20050526
Implantable Lead Location: 753859
Implantable Lead Location: 753860
Implantable Lead Model: 5076
Implantable Pulse Generator Implant Date: 20140204
Lead Channel Impedance Value: 330 Ohm
Lead Channel Impedance Value: 450 Ohm
Lead Channel Pacing Threshold Amplitude: 1 V
Lead Channel Pacing Threshold Amplitude: 1.5 V
Lead Channel Pacing Threshold Pulse Width: 0.5 ms
Lead Channel Pacing Threshold Pulse Width: 1 ms
Lead Channel Sensing Intrinsic Amplitude: 1.3 mV
Lead Channel Sensing Intrinsic Amplitude: 5.8 mV
Lead Channel Setting Pacing Amplitude: 2.5 V
Lead Channel Setting Pacing Amplitude: 2.5 V
Lead Channel Setting Pacing Pulse Width: 1 ms
Lead Channel Setting Sensing Sensitivity: 1 mV
Pulse Gen Model: 2210
Pulse Gen Serial Number: 7445049

## 2018-08-13 ENCOUNTER — Other Ambulatory Visit: Payer: Self-pay | Admitting: Cardiovascular Disease

## 2018-08-14 NOTE — Progress Notes (Signed)
Remote pacemaker transmission.   

## 2018-08-29 DIAGNOSIS — I10 Essential (primary) hypertension: Secondary | ICD-10-CM | POA: Diagnosis not present

## 2018-08-29 DIAGNOSIS — M25561 Pain in right knee: Secondary | ICD-10-CM | POA: Diagnosis not present

## 2018-08-29 DIAGNOSIS — E785 Hyperlipidemia, unspecified: Secondary | ICD-10-CM | POA: Diagnosis not present

## 2018-08-29 DIAGNOSIS — D513 Other dietary vitamin B12 deficiency anemia: Secondary | ICD-10-CM | POA: Diagnosis not present

## 2018-08-29 DIAGNOSIS — R5383 Other fatigue: Secondary | ICD-10-CM | POA: Diagnosis not present

## 2018-08-29 DIAGNOSIS — K5900 Constipation, unspecified: Secondary | ICD-10-CM | POA: Diagnosis not present

## 2018-08-29 DIAGNOSIS — M25562 Pain in left knee: Secondary | ICD-10-CM | POA: Diagnosis not present

## 2018-09-06 ENCOUNTER — Telehealth: Payer: Self-pay | Admitting: *Deleted

## 2018-09-06 NOTE — Telephone Encounter (Signed)
Attempted to reach the patient about her appointment on 09/09/2018 with Dr. Sallyanne Kuster. Was unable to reach her at both numbers provided. One has been disconnected [phone])  and the other one was unable to leave a message.  The daughter's phone number rings busy.

## 2018-09-09 ENCOUNTER — Encounter: Payer: Medicare Other | Admitting: Cardiovascular Disease

## 2018-10-20 ENCOUNTER — Other Ambulatory Visit: Payer: Self-pay | Admitting: Cardiovascular Disease

## 2018-10-21 ENCOUNTER — Encounter: Payer: Medicare Other | Admitting: Cardiovascular Disease

## 2018-11-03 ENCOUNTER — Other Ambulatory Visit: Payer: Self-pay | Admitting: Cardiovascular Disease

## 2018-11-05 ENCOUNTER — Ambulatory Visit (INDEPENDENT_AMBULATORY_CARE_PROVIDER_SITE_OTHER): Payer: Medicare Other | Admitting: *Deleted

## 2018-11-05 DIAGNOSIS — I495 Sick sinus syndrome: Secondary | ICD-10-CM | POA: Diagnosis not present

## 2018-11-05 LAB — CUP PACEART REMOTE DEVICE CHECK
Battery Remaining Longevity: 41 mo
Battery Remaining Percentage: 51 %
Battery Voltage: 2.87 V
Brady Statistic AP VP Percent: 37 %
Brady Statistic AP VS Percent: 56 %
Brady Statistic AS VP Percent: 3.9 %
Brady Statistic AS VS Percent: 3.3 %
Brady Statistic RA Percent Paced: 61 %
Brady Statistic RV Percent Paced: 52 %
Date Time Interrogation Session: 20200907173354
Implantable Lead Implant Date: 20050526
Implantable Lead Implant Date: 20050526
Implantable Lead Location: 753859
Implantable Lead Location: 753860
Implantable Lead Model: 5076
Implantable Pulse Generator Implant Date: 20140204
Lead Channel Impedance Value: 360 Ohm
Lead Channel Impedance Value: 400 Ohm
Lead Channel Pacing Threshold Amplitude: 1 V
Lead Channel Pacing Threshold Amplitude: 1.5 V
Lead Channel Pacing Threshold Pulse Width: 0.5 ms
Lead Channel Pacing Threshold Pulse Width: 1 ms
Lead Channel Sensing Intrinsic Amplitude: 2.6 mV
Lead Channel Sensing Intrinsic Amplitude: 9.1 mV
Lead Channel Setting Pacing Amplitude: 2.5 V
Lead Channel Setting Pacing Amplitude: 2.5 V
Lead Channel Setting Pacing Pulse Width: 1 ms
Lead Channel Setting Sensing Sensitivity: 1 mV
Pulse Gen Model: 2210
Pulse Gen Serial Number: 7445049

## 2018-11-16 ENCOUNTER — Other Ambulatory Visit: Payer: Self-pay | Admitting: Cardiovascular Disease

## 2018-11-21 ENCOUNTER — Encounter: Payer: Self-pay | Admitting: Cardiology

## 2018-11-21 NOTE — Progress Notes (Signed)
Remote pacemaker transmission.   

## 2018-11-29 DIAGNOSIS — M170 Bilateral primary osteoarthritis of knee: Secondary | ICD-10-CM | POA: Diagnosis not present

## 2018-11-29 DIAGNOSIS — I4891 Unspecified atrial fibrillation: Secondary | ICD-10-CM | POA: Diagnosis not present

## 2018-11-29 DIAGNOSIS — Z23 Encounter for immunization: Secondary | ICD-10-CM | POA: Diagnosis not present

## 2018-11-29 DIAGNOSIS — I10 Essential (primary) hypertension: Secondary | ICD-10-CM | POA: Diagnosis not present

## 2018-11-29 DIAGNOSIS — Z79899 Other long term (current) drug therapy: Secondary | ICD-10-CM | POA: Diagnosis not present

## 2018-11-29 DIAGNOSIS — N399 Disorder of urinary system, unspecified: Secondary | ICD-10-CM | POA: Diagnosis not present

## 2018-11-29 DIAGNOSIS — E119 Type 2 diabetes mellitus without complications: Secondary | ICD-10-CM | POA: Diagnosis not present

## 2018-12-04 ENCOUNTER — Other Ambulatory Visit: Payer: Self-pay | Admitting: Cardiovascular Disease

## 2018-12-04 NOTE — Telephone Encounter (Signed)
Pt due for 12 month f/u. Please contact pt for future appointment. 

## 2018-12-11 ENCOUNTER — Other Ambulatory Visit: Payer: Self-pay | Admitting: Cardiovascular Disease

## 2018-12-23 ENCOUNTER — Ambulatory Visit (INDEPENDENT_AMBULATORY_CARE_PROVIDER_SITE_OTHER): Payer: Medicare Other | Admitting: Cardiovascular Disease

## 2018-12-23 ENCOUNTER — Other Ambulatory Visit: Payer: Self-pay

## 2018-12-23 ENCOUNTER — Telehealth: Payer: Self-pay

## 2018-12-23 VITALS — BP 124/73 | HR 70 | Ht 64.0 in | Wt 154.0 lb

## 2018-12-23 DIAGNOSIS — N183 Chronic kidney disease, stage 3 unspecified: Secondary | ICD-10-CM

## 2018-12-23 DIAGNOSIS — Z7901 Long term (current) use of anticoagulants: Secondary | ICD-10-CM

## 2018-12-23 DIAGNOSIS — I2510 Atherosclerotic heart disease of native coronary artery without angina pectoris: Secondary | ICD-10-CM | POA: Diagnosis not present

## 2018-12-23 DIAGNOSIS — I480 Paroxysmal atrial fibrillation: Secondary | ICD-10-CM | POA: Diagnosis not present

## 2018-12-23 DIAGNOSIS — I495 Sick sinus syndrome: Secondary | ICD-10-CM | POA: Diagnosis not present

## 2018-12-23 DIAGNOSIS — E7800 Pure hypercholesterolemia, unspecified: Secondary | ICD-10-CM | POA: Diagnosis not present

## 2018-12-23 DIAGNOSIS — E7801 Familial hypercholesterolemia: Secondary | ICD-10-CM

## 2018-12-23 DIAGNOSIS — Z950 Presence of cardiac pacemaker: Secondary | ICD-10-CM

## 2018-12-23 DIAGNOSIS — E1122 Type 2 diabetes mellitus with diabetic chronic kidney disease: Secondary | ICD-10-CM

## 2018-12-23 DIAGNOSIS — I10 Essential (primary) hypertension: Secondary | ICD-10-CM

## 2018-12-23 DIAGNOSIS — Z953 Presence of xenogenic heart valve: Secondary | ICD-10-CM

## 2018-12-23 LAB — PACEMAKER DEVICE OBSERVATION

## 2018-12-23 MED ORDER — REPATHA SURECLICK 140 MG/ML ~~LOC~~ SOAJ: 140.0000 mg | SUBCUTANEOUS | 6 refills | Status: DC

## 2018-12-23 NOTE — Telephone Encounter (Signed)
Called and spoke with [name] regarding the repatha approval and rx sent to pharmacy

## 2018-12-23 NOTE — Patient Instructions (Signed)
Medication Instructions:  Dr. Sallyanne Kuster recommends Repatha (PCSK9). This is an injectable cholesterol medication. This medication will need prior approval with your insurance company, which we will work on. If the medication is not approved initially, we may need to do an appeal with your insurance. We will keep you updated on this process. This medication can be provided at some local pharmacies or be shipped to you from a specialty pharmacy.   Our pharmacist will reach out to you to get the process started.  *If you need a refill on your cardiac medications before your next appointment, please call your pharmacy*  Lab Work: Your provider would like for you to have the following labs today: Lipid  If you have labs (blood work) drawn today and your tests are completely normal, you will receive your results only by: Marland Kitchen MyChart Message (if you have MyChart) OR . A paper copy in the mail If you have any lab test that is abnormal or we need to change your treatment, we will call you to review the results.  Testing/Procedures: None ordered  Follow-Up: At [facility], you and your health needs are our priority.  As part of our continuing mission to provide you with exceptional heart care, we have created designated Provider Care Teams.  These Care Teams include your primary Cardiologist (physician) and Advanced Practice Providers (APPs -  Physician Assistants and Nurse Practitioners) who all work together to provide you with the care you need, when you need it.  Your next appointment:   12 months  The format for your next appointment:   In Person  Provider:   Sanda Klein, MD

## 2018-12-23 NOTE — Progress Notes (Signed)
Patient ID: [name], female   DOB: 12-21-1929, 85 y.o.   MRN: [id number]     Cardiology Office Note    Date:  12/25/2018   ID:  HARRIS KISTLER, DOB 09-05-1929, MRN [id number]  PCP:  Imagene Riches, NP  Cardiologist:   Sanda Klein, MD   Chief Complaint  Patient presents with  . Atrial Fibrillation  . Pacemaker Check  . Coronary Artery Disease    Hypercholesterolemia  . Cardiac Valve Problem    History of Present Illness:  [name] is a 85 y.o. female with a history of paroxysmal atrial fibrillation on chronic anticoagulation, sinus node dysfunction s/p dual chamber pacemaker (leads 2005, generator change 2014 [employer]), valvular heart disease with a biological aortic valve prosthesis (21 mm, 2005, Dr. Servando Snare), severe hypercholesterolemia. She has not had clinical coronary disease, but preoperative cardiac catheterization in 2005 showed 40% LAD stenosis.   The patient specifically denies any chest pain at rest exertion, dyspnea at rest or with exertion, orthopnea, paroxysmal nocturnal dyspnea, syncope, palpitations, focal neurological deficits, intermittent claudication, lower extremity edema, unexplained weight gain, cough, hemoptysis or wheezing.  Pacemaker interrogation shows normal device function. She has a Insurance claims handler implanted in 2014. Atrial lead parameters are excellent. Ventricular lead threshold is slightly elevated chronically but is stable. Estimated generator longevity is 3.2 years. She paces the atrium 53% of the time and paces the ventricle 51 % of the time.  She appears to pace the ventricle more frequently when she is in atrial fibrillation.  The reported burden of atrial fibrillation is high at 22% (she had uninterrupted persistent atrial fibrillation for over 3 months in the spring 2019), but the counters have not been in the last several months 11-hour of sleep that occurred on 12/09/2018. Overall burden in last 12 months is probably  <1%.  Counters were cleared today and cybersecurity upgrade was performed.  As before, she appears to be oblivious to the arrhythmia.  He can tell a difference symptomatically.  She is in normal rhythm today (atrial paced, ventricular sensed).  Her most recent echocardiogram was performed in October July 2019.  The ventricular systolic function is normal, gradients across aortic valve prosthesis (peak 20, mean 10) are normal and she has trivial central aortic insufficiency (there is also report of trivial perivalvular insufficiency which I do not agree with).  She has not been able to tolerate statins after trying multiple different agents.  She also was intolerant of Zetia labs performed today again show severe hypercholesterolemia. Past Medical History:  Diagnosis Date  . Anemia   . Anxiety   . CAD (coronary artery disease)   . CHF (congestive heart failure) (HCC)    EF 55% on 01/05/11, trace AR w/ Bioprosthetic valve  . DM2 (diabetes mellitus, type 2) ([place])   . GERD (gastroesophageal reflux disease)   . HLD (hyperlipidemia)   . HTN (hypertension)   . PAF (paroxysmal atrial fibrillation) ([place])   . Restless legs syndrome (RLS)   . Tobacco abuse     Past Surgical History:  Procedure Laterality Date  . AORTIC VALVE REPLACEMENT  08/25/2002   tissue valve  . CARDIOVERSION N/A 05/05/2014   Procedure: CARDIOVERSION;  Surgeon: Dorothy Spark, MD;  Location: [facility];  Service: Cardiovascular;  Laterality: N/A;  . PACEMAKER INSERTION  04/02/2012   [employer]  . PERMANENT PACEMAKER GENERATOR CHANGE N/A 04/02/2012   Procedure: PERMANENT PACEMAKER GENERATOR CHANGE;  Surgeon: Sanda Klein, MD;  Location: [facility]  CATH LAB;  Service: Cardiovascular;  Laterality: N/A;    Outpatient Medications Prior to Visit  Medication Sig Dispense Refill  . ALPRAZolam (XANAX) 1 MG tablet Take 0.5 tablets (0.5 mg total) by mouth 2 (two) times daily as needed for anxiety. 60 tablet 0  . apixaban (ELIQUIS) 2.5 MG  TABS tablet TAKE 1 TABLET (2.5 MG TOTAL) BY MOUTH 2 (TWO) TIMES DAILY. 60 tablet 11  . benazepril (LOTENSIN) 20 MG tablet TAKE 1 TABLET BY MOUTH TWICE A DAY 60 tablet 1  . Blood Glucose Monitoring Suppl (ONE TOUCH ULTRA 2) W/DEVICE KIT   0  . diltiazem (CARDIZEM CD) 240 MG 24 hr capsule TAKE 1 CAPSULE BY MOUTH EVERY DAY 90 capsule 0  . ezetimibe (ZETIA) 10 MG tablet TAKE 1 TABLET (10 MG TOTAL) BY MOUTH DAILY. 90 tablet 0  . ferrous sulfate 325 (65 FE) MG tablet Take 1 tablet (325 mg total) by mouth 2 (two) times daily with a meal. (Patient taking differently: Take 650 mg by mouth daily with breakfast. ) 60 tablet 3  . fluticasone (FLONASE) 50 MCG/ACT nasal spray Place 2 sprays into both nostrils daily. 16 g 5  . furosemide (LASIX) 40 MG tablet TAKE 1 TABLET BY MOUTH TWICE A DAY AS NEEDED FOR SWELLING OR SHORTNESS OF BREATH 60 tablet 2  . metFORMIN (GLUCOPHAGE) 500 MG tablet TAKE 1 TABLET (500 MG TOTAL) BY MOUTH 2 (TWO) TIMES DAILY WITH A MEAL. 180 tablet 3  . metoprolol tartrate (LOPRESSOR) 100 MG tablet Take 1 tablet (100 mg total) by mouth 2 (two) times daily. 60 tablet 5  . omeprazole (PRILOSEC) 20 MG capsule TAKE 1 CAPSULE BY MOUTH EVERY DAY 90 capsule 0  . ONE TOUCH ULTRA TEST test strip TEST BLOOD SUGAR TWICE A DAY 50 each 4   No facility-administered medications prior to visit.      Allergies:   Pravachol [pravastatin sodium] and Tape   Social History   Socioeconomic History  . Marital status: Widowed    Spouse name: Not on file  . Number of children: 5  . Years of education: 5  . Highest education level: Not on file  Occupational History  . Occupation: Museum/gallery exhibitions officer: RETIRED  Social Needs  . Financial resource strain: Not on file  . Food insecurity    Worry: Not on file    Inability: Not on file  . Transportation needs    Medical: Not on file    Non-medical: Not on file  Tobacco Use  . Smoking status: Never Smoker  . Smokeless tobacco: Current User     Types: Snuff  Substance and Sexual Activity  . Alcohol use: No    Alcohol/week: 0.0 standard drinks  . Drug use: No  . Sexual activity: Not on file  Lifestyle  . Physical activity    Days per week: Not on file    Minutes per session: Not on file  . Stress: Not on file  Relationships  . Social Herbalist on phone: Not on file    Gets together: Not on file    Attends religious service: Not on file    Active member of club or organization: Not on file    Attends meetings of clubs or organizations: Not on file    Relationship status: Not on file  Other Topics Concern  . Not on file  Social History Narrative   Health Care POA:    Emergency Contact: daughter, [name] [phone]  End of Life Plan: gave pt ad pamphlet   Who lives with you: daughter and 2 grandchildren   Any pets: 2 dogs   Diet: pt has a varied diet of protein, starch and vegetables.   Exercise: pt garden's daily   Seatbelts: pt does not wear seatbelt because of pace maker   Sun Exposure/Protection: pt does not use sun protection, has irregular dermatology appointments.   Hobbies: gardening, sitting on porch.           Family History:  The patient's family history includes Heart attack in her brother; Kidney disease in her sister.   ROS:   Please see the history of present illness.    ROS All other systems are reviewed and are negative.  PHYSICAL EXAM:   VS:  BP 124/73   Pulse 70   Ht '5\' 4"'  (1.626 m)   Wt 154 lb (69.9 kg)   BMI 26.43 kg/m      General: Alert, oriented x3, no distress Head: no evidence of trauma, PERRL, EOMI, no exophtalmos or lid lag, no myxedema, no xanthelasma; normal ears, nose and oropharynx Neck: normal jugular venous pulsations and no hepatojugular reflux; brisk carotid pulses without delay and no carotid bruits Chest: clear to auscultation, no signs of consolidation by percussion or palpation, normal fremitus, symmetrical and full respiratory  excursions Cardiovascular: normal position and quality of the apical impulse, regular rhythm, normal first and second heart sounds, 3/6 early peaking systolic ejection murmur in the aortic focus radiating to the carotids, no diastolic murmurs, rubs or gallops, healthy left subclavian pacemaker site Abdomen: no tenderness or distention, no masses by palpation, no abnormal pulsatility or arterial bruits, normal bowel sounds, no hepatosplenomegaly Extremities: no clubbing, cyanosis or edema; 2+ radial, ulnar and brachial pulses bilaterally; 2+ right femoral, posterior tibial and dorsalis pedis pulses; 2+ left femoral, posterior tibial and dorsalis pedis pulses; no subclavian or femoral bruits Neurological: grossly nonfocal Psych: Normal mood and affect    Wt Readings from Last 3 Encounters:  12/23/18 154 lb (69.9 kg)  09/04/17 152 lb (68.9 kg)  07/26/16 140 lb 12.8 oz (63.9 kg)      Studies/Labs Reviewed:  HEKG:  EKG is  ordered today.   It shows atrial paced ventricular sensed rhythm with left anterior fascicular block and generalized low voltage.  Inverted T waves are seen in the inferior leads and V3-V6 QTC 429 ms  Recent Labs: Labs from 11/29/2018 show normal liver function tests and normal TSH, hemoglobin 11.5, creatinine 1.46  Lipid Panel    Component Value Date/Time   CHOL 261 (H) 12/23/2018 1217   TRIG 154 (H) 12/23/2018 1217   HDL 59 12/23/2018 1217   CHOLHDL 4.4 12/23/2018 1217   CHOLHDL 6.8 Ratio 04/06/2009 2033   VLDL 41 (H) 04/06/2009 2033   LDLCALC 174 (H) 12/23/2018 1217   LDLDIRECT 141 (H) 06/14/2015 1507     ASSESSMENT:    1. Paroxysmal atrial fibrillation (HCC)   2. SSS (sick sinus syndrome) ([place])   3. Pacemaker   4. Coronary artery disease involving native coronary artery of native heart without angina pectoris   5. Heterozygous familial hypercholesterolemia   6. Controlled type 2 diabetes mellitus with stage 3 chronic kidney disease, without long-term  current use of insulin ([place])   7. Essential hypertension   8. History of aortic valve replacement with bioprosthetic valve   9. Long term (current) use of anticoagulants   10. CKD stage 3 due to type 2 diabetes mellitus (  [place])      PLAN:  In order of problems listed above:  1. Atrial flutter/atrial fibrillation: Remarkably reduced burden in the last year.  She has had sustained persistent atrial fibrillation for over 3 months at a time, but intermittently goes back to normal rhythm and over the last 12 months has probably had less than 1% atrial fibrillation.   It does not appear to cause any change in symptoms.  She is compliant with anticoagulation and has not had bleeding.  High ventricular rates are not an issue.  CHADSVasc score 6 (age 6, gender, HBP, vascular disease, DM).  Antiarrhythmics and cardioversion but do not appear to be justified. 2. SSS: Appropriate heart rate histogram distribution of current sensor settings 3. Pacemaker.  She continues to have relatively frequent ventricular pacing, but better since we reduced her dose of diltiazem we will decrease the dose of diltiazem.  Normal device function.  Continue remote downloads every 3 months and yearly office visits 4. CAD: She does not have angina.. Minor coronary artery disease by cardiac catheterization in 2005.  5. 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  Her daughter [name] has a very similar lipid profile.  Unfortunately they are both statin intolerant.  We will try to see if we can get PCSK9 inhibitors for both of them 6. DM:  A1c 6.1% per the patient's report 7. HTN: Well-controlled. 8. AVR: Her murmur is loud, but transprosthetic gradients are low and stable compared to previous studies.  Normal prosthetic valve function. 9. Chronic apixaban anticoagulation: She was very anemic about a year ago, but hemoglobin is stable at 11.5, although we have continued  anticoagulation.  She does not have any overt bleeding. She is on reduced dose Eliquis because of her age and smallbody size. 10. CKD: Baseline GFR approximately 30 mL/minute based on the most recent tests, an improvement.      Medication Adjustments/Labs and Tests Ordered: Current medicines are reviewed at length with the patient today.  Concerns regarding medicines are outlined above.  Medication changes, Labs and Tests ordered today are listed in the Patient Instructions below. Patient Instructions  Medication Instructions:  Dr. Sallyanne Kuster recommends Repatha (PCSK9). This is an injectable cholesterol medication. This medication will need prior approval with your insurance company, which we will work on. If the medication is not approved initially, we may need to do an appeal with your insurance. We will keep you updated on this process. This medication can be provided at some local pharmacies or be shipped to you from a specialty pharmacy.   Our pharmacist will reach out to you to get the process started.  *If you need a refill on your cardiac medications before your next appointment, please call your pharmacy*  Lab Work: Your provider would like for you to have the following labs today: Lipid  If you have labs (blood work) drawn today and your tests are completely normal, you will receive your results only by: Marland Kitchen MyChart Message (if you have MyChart) OR . A paper copy in the mail If you have any lab test that is abnormal or we need to change your treatment, we will call you to review the results.  Testing/Procedures: None ordered  Follow-Up: At [facility], you and your health needs are our priority.  As part of our continuing mission to provide you with exceptional heart care, we have created designated Provider Care Teams.  These Care Teams include your primary Cardiologist (physician) and  Advanced Practice Providers (APPs -  Physician Assistants and Nurse Practitioners) who all work  together to provide you with the care you need, when you need it.  Your next appointment:   12 months  The format for your next appointment:   In Person  Provider:   Sanda Klein, MD         Signed, Sanda Klein, MD  12/25/2018 6:07 PM    [address], [place], [place]  [address] Phone: [phone]; Fax: [phone]

## 2018-12-24 LAB — LIPID PANEL
Chol/HDL Ratio: 4.4 ratio (ref 0.0–4.4)
Cholesterol, Total: 261 mg/dL — ABNORMAL HIGH (ref 100–199)
HDL: 59 mg/dL (ref >39)
LDL Chol Calc (NIH): 174 mg/dL — ABNORMAL HIGH (ref 0–99)
Triglycerides: 154 mg/dL — ABNORMAL HIGH (ref 0–149)
VLDL Cholesterol Cal: 28 mg/dL (ref 5–40)

## 2018-12-25 ENCOUNTER — Encounter: Payer: Self-pay | Admitting: Cardiovascular Disease

## 2019-01-10 ENCOUNTER — Other Ambulatory Visit: Payer: Self-pay | Admitting: Cardiovascular Disease

## 2019-02-03 ENCOUNTER — Other Ambulatory Visit: Payer: Self-pay | Admitting: Cardiovascular Disease

## 2019-02-03 LAB — CUP PACEART REMOTE DEVICE CHECK
Battery Remaining Longevity: 35 mo
Battery Remaining Percentage: 46 %
Battery Voltage: 2.86 V
Brady Statistic AP VP Percent: 37 %
Brady Statistic AP VS Percent: 55 %
Brady Statistic AS VP Percent: 3.5 %
Brady Statistic AS VS Percent: 3.9 %
Brady Statistic RA Percent Paced: 64 %
Brady Statistic RV Percent Paced: 51 %
Date Time Interrogation Session: 20201207025918
Implantable Lead Implant Date: 20050526
Implantable Lead Implant Date: 20050526
Implantable Lead Location: 753859
Implantable Lead Location: 753860
Implantable Lead Model: 5076
Implantable Pulse Generator Implant Date: 20140204
Lead Channel Impedance Value: 350 Ohm
Lead Channel Impedance Value: 410 Ohm
Lead Channel Pacing Threshold Amplitude: 1 V
Lead Channel Pacing Threshold Amplitude: 1.625 V
Lead Channel Pacing Threshold Pulse Width: 0.5 ms
Lead Channel Pacing Threshold Pulse Width: 1 ms
Lead Channel Sensing Intrinsic Amplitude: 1.2 mV
Lead Channel Sensing Intrinsic Amplitude: 9.1 mV
Lead Channel Setting Pacing Amplitude: 2.5 V
Lead Channel Setting Pacing Amplitude: 3.125
Lead Channel Setting Pacing Pulse Width: 1 ms
Lead Channel Setting Sensing Sensitivity: 1 mV
Pulse Gen Model: 2210
Pulse Gen Serial Number: 7445049

## 2019-02-04 ENCOUNTER — Ambulatory Visit (INDEPENDENT_AMBULATORY_CARE_PROVIDER_SITE_OTHER): Payer: Medicare Other | Admitting: *Deleted

## 2019-02-04 DIAGNOSIS — I495 Sick sinus syndrome: Secondary | ICD-10-CM

## 2019-03-07 ENCOUNTER — Other Ambulatory Visit: Payer: Self-pay | Admitting: Cardiovascular Disease

## 2019-03-07 NOTE — Progress Notes (Signed)
PPM remote 

## 2019-04-07 DIAGNOSIS — Z0101 Encounter for examination of eyes and vision with abnormal findings: Secondary | ICD-10-CM | POA: Diagnosis not present

## 2019-04-07 DIAGNOSIS — I4891 Unspecified atrial fibrillation: Secondary | ICD-10-CM | POA: Diagnosis not present

## 2019-04-07 DIAGNOSIS — E119 Type 2 diabetes mellitus without complications: Secondary | ICD-10-CM | POA: Diagnosis not present

## 2019-04-07 DIAGNOSIS — M170 Bilateral primary osteoarthritis of knee: Secondary | ICD-10-CM | POA: Diagnosis not present

## 2019-04-07 DIAGNOSIS — Z79899 Other long term (current) drug therapy: Secondary | ICD-10-CM | POA: Diagnosis not present

## 2019-04-07 DIAGNOSIS — Z0001 Encounter for general adult medical examination with abnormal findings: Secondary | ICD-10-CM | POA: Diagnosis not present

## 2019-04-07 DIAGNOSIS — I10 Essential (primary) hypertension: Secondary | ICD-10-CM | POA: Diagnosis not present

## 2019-05-06 ENCOUNTER — Ambulatory Visit (INDEPENDENT_AMBULATORY_CARE_PROVIDER_SITE_OTHER): Payer: Medicare Other | Admitting: *Deleted

## 2019-05-06 DIAGNOSIS — I495 Sick sinus syndrome: Secondary | ICD-10-CM

## 2019-05-06 LAB — CUP PACEART REMOTE DEVICE CHECK
Battery Remaining Longevity: 31 mo
Battery Remaining Percentage: 40 %
Battery Voltage: 2.84 V
Brady Statistic AP VP Percent: 35 %
Brady Statistic AP VS Percent: 56 %
Brady Statistic AS VP Percent: 3.4 %
Brady Statistic AS VS Percent: 5.4 %
Brady Statistic RA Percent Paced: 65 %
Brady Statistic RV Percent Paced: 48 %
Date Time Interrogation Session: 20210309033338
Implantable Lead Implant Date: 20050526
Implantable Lead Implant Date: 20050526
Implantable Lead Location: 753859
Implantable Lead Location: 753860
Implantable Lead Model: 5076
Implantable Pulse Generator Implant Date: 20140204
Lead Channel Impedance Value: 350 Ohm
Lead Channel Impedance Value: 410 Ohm
Lead Channel Pacing Threshold Amplitude: 1 V
Lead Channel Pacing Threshold Amplitude: 1.5 V
Lead Channel Pacing Threshold Pulse Width: 0.5 ms
Lead Channel Pacing Threshold Pulse Width: 1 ms
Lead Channel Sensing Intrinsic Amplitude: 1.1 mV
Lead Channel Sensing Intrinsic Amplitude: 10.2 mV
Lead Channel Setting Pacing Amplitude: 2.5 V
Lead Channel Setting Pacing Amplitude: 2.5 V
Lead Channel Setting Pacing Pulse Width: 1 ms
Lead Channel Setting Sensing Sensitivity: 1 mV
Pulse Gen Model: 2210
Pulse Gen Serial Number: 7445049

## 2019-05-07 NOTE — Progress Notes (Signed)
PPM Remote  

## 2019-06-10 DIAGNOSIS — L821 Other seborrheic keratosis: Secondary | ICD-10-CM | POA: Diagnosis not present

## 2019-06-10 DIAGNOSIS — L578 Other skin changes due to chronic exposure to nonionizing radiation: Secondary | ICD-10-CM | POA: Diagnosis not present

## 2019-06-10 DIAGNOSIS — C44311 Basal cell carcinoma of skin of nose: Secondary | ICD-10-CM | POA: Diagnosis not present

## 2019-06-10 DIAGNOSIS — C44219 Basal cell carcinoma of skin of left ear and external auricular canal: Secondary | ICD-10-CM | POA: Diagnosis not present

## 2019-06-10 DIAGNOSIS — L853 Xerosis cutis: Secondary | ICD-10-CM | POA: Diagnosis not present

## 2019-06-10 DIAGNOSIS — C44319 Basal cell carcinoma of skin of other parts of face: Secondary | ICD-10-CM | POA: Diagnosis not present

## 2019-06-18 DIAGNOSIS — C44311 Basal cell carcinoma of skin of nose: Secondary | ICD-10-CM | POA: Diagnosis not present

## 2019-06-18 DIAGNOSIS — C44319 Basal cell carcinoma of skin of other parts of face: Secondary | ICD-10-CM | POA: Diagnosis not present

## 2019-07-09 DIAGNOSIS — Z79899 Other long term (current) drug therapy: Secondary | ICD-10-CM | POA: Diagnosis not present

## 2019-07-09 DIAGNOSIS — K5900 Constipation, unspecified: Secondary | ICD-10-CM | POA: Diagnosis not present

## 2019-07-09 DIAGNOSIS — I10 Essential (primary) hypertension: Secondary | ICD-10-CM | POA: Diagnosis not present

## 2019-07-09 DIAGNOSIS — E119 Type 2 diabetes mellitus without complications: Secondary | ICD-10-CM | POA: Diagnosis not present

## 2019-07-09 DIAGNOSIS — I4891 Unspecified atrial fibrillation: Secondary | ICD-10-CM | POA: Diagnosis not present

## 2019-07-09 DIAGNOSIS — M170 Bilateral primary osteoarthritis of knee: Secondary | ICD-10-CM | POA: Diagnosis not present

## 2019-07-27 ENCOUNTER — Other Ambulatory Visit: Payer: Self-pay | Admitting: Cardiovascular Disease

## 2019-08-05 ENCOUNTER — Ambulatory Visit (INDEPENDENT_AMBULATORY_CARE_PROVIDER_SITE_OTHER): Payer: Medicare Other | Admitting: *Deleted

## 2019-08-05 DIAGNOSIS — I495 Sick sinus syndrome: Secondary | ICD-10-CM | POA: Diagnosis not present

## 2019-08-06 ENCOUNTER — Telehealth: Payer: Self-pay

## 2019-08-06 NOTE — Telephone Encounter (Signed)
Left message for patient to remind of missed remote transmission.  

## 2019-08-11 LAB — CUP PACEART REMOTE DEVICE CHECK
Battery Remaining Longevity: 27 mo
Battery Remaining Percentage: 35 %
Battery Voltage: 2.83 V
Brady Statistic AP VP Percent: 32 %
Brady Statistic AP VS Percent: 58 %
Brady Statistic AS VP Percent: 3.1 %
Brady Statistic AS VS Percent: 6.9 %
Brady Statistic RA Percent Paced: 65 %
Brady Statistic RV Percent Paced: 45 %
Date Time Interrogation Session: 20210613231134
Implantable Lead Implant Date: 20050526
Implantable Lead Implant Date: 20050526
Implantable Lead Location: 753859
Implantable Lead Location: 753860
Implantable Lead Model: 5076
Implantable Pulse Generator Implant Date: 20140204
Lead Channel Impedance Value: 350 Ohm
Lead Channel Impedance Value: 400 Ohm
Lead Channel Pacing Threshold Amplitude: 1 V
Lead Channel Pacing Threshold Amplitude: 1.5 V
Lead Channel Pacing Threshold Pulse Width: 0.5 ms
Lead Channel Pacing Threshold Pulse Width: 1 ms
Lead Channel Sensing Intrinsic Amplitude: 1 mV
Lead Channel Sensing Intrinsic Amplitude: 10.3 mV
Lead Channel Setting Pacing Amplitude: 2.5 V
Lead Channel Setting Pacing Amplitude: 2.5 V
Lead Channel Setting Pacing Pulse Width: 1 ms
Lead Channel Setting Sensing Sensitivity: 1 mV
Pulse Gen Model: 2210
Pulse Gen Serial Number: 7445049

## 2019-08-11 NOTE — Progress Notes (Signed)
Remote pacemaker transmission.   

## 2019-09-26 ENCOUNTER — Other Ambulatory Visit: Payer: Self-pay | Admitting: Cardiovascular Disease

## 2019-10-09 DIAGNOSIS — E119 Type 2 diabetes mellitus without complications: Secondary | ICD-10-CM | POA: Diagnosis not present

## 2019-10-09 DIAGNOSIS — N390 Urinary tract infection, site not specified: Secondary | ICD-10-CM | POA: Diagnosis not present

## 2019-10-09 DIAGNOSIS — I4891 Unspecified atrial fibrillation: Secondary | ICD-10-CM | POA: Diagnosis not present

## 2019-10-09 DIAGNOSIS — M170 Bilateral primary osteoarthritis of knee: Secondary | ICD-10-CM | POA: Diagnosis not present

## 2019-10-09 DIAGNOSIS — Z79899 Other long term (current) drug therapy: Secondary | ICD-10-CM | POA: Diagnosis not present

## 2019-10-09 DIAGNOSIS — I10 Essential (primary) hypertension: Secondary | ICD-10-CM | POA: Diagnosis not present

## 2019-10-09 DIAGNOSIS — R9431 Abnormal electrocardiogram [ECG] [EKG]: Secondary | ICD-10-CM | POA: Diagnosis not present

## 2019-10-09 DIAGNOSIS — K5900 Constipation, unspecified: Secondary | ICD-10-CM | POA: Diagnosis not present

## 2019-11-04 ENCOUNTER — Ambulatory Visit (INDEPENDENT_AMBULATORY_CARE_PROVIDER_SITE_OTHER): Payer: Medicare Other | Admitting: *Deleted

## 2019-11-04 DIAGNOSIS — I495 Sick sinus syndrome: Secondary | ICD-10-CM

## 2019-11-04 DIAGNOSIS — I480 Paroxysmal atrial fibrillation: Secondary | ICD-10-CM

## 2019-11-04 LAB — CUP PACEART REMOTE DEVICE CHECK
Battery Remaining Longevity: 23 mo
Battery Remaining Percentage: 29 %
Battery Voltage: 2.81 V
Brady Statistic AP VP Percent: 31 %
Brady Statistic AP VS Percent: 58 %
Brady Statistic AS VP Percent: 3.1 %
Brady Statistic AS VS Percent: 8 %
Brady Statistic RA Percent Paced: 66 %
Brady Statistic RV Percent Paced: 44 %
Date Time Interrogation Session: 20210907034236
Implantable Lead Implant Date: 20050526
Implantable Lead Implant Date: 20050526
Implantable Lead Location: 753859
Implantable Lead Location: 753860
Implantable Lead Model: 5076
Implantable Pulse Generator Implant Date: 20140204
Lead Channel Impedance Value: 340 Ohm
Lead Channel Impedance Value: 400 Ohm
Lead Channel Pacing Threshold Amplitude: 1 V
Lead Channel Pacing Threshold Amplitude: 1.625 V
Lead Channel Pacing Threshold Pulse Width: 0.5 ms
Lead Channel Pacing Threshold Pulse Width: 1 ms
Lead Channel Sensing Intrinsic Amplitude: 0.6 mV
Lead Channel Sensing Intrinsic Amplitude: 9.5 mV
Lead Channel Setting Pacing Amplitude: 2.5 V
Lead Channel Setting Pacing Amplitude: 3.125
Lead Channel Setting Pacing Pulse Width: 1 ms
Lead Channel Setting Sensing Sensitivity: 1 mV
Pulse Gen Model: 2210
Pulse Gen Serial Number: 7445049

## 2019-11-06 NOTE — Progress Notes (Signed)
Remote pacemaker transmission.   

## 2019-12-08 DIAGNOSIS — N1832 Chronic kidney disease, stage 3b: Secondary | ICD-10-CM | POA: Diagnosis not present

## 2019-12-08 DIAGNOSIS — R2689 Other abnormalities of gait and mobility: Secondary | ICD-10-CM | POA: Diagnosis not present

## 2019-12-08 DIAGNOSIS — Z23 Encounter for immunization: Secondary | ICD-10-CM | POA: Diagnosis not present

## 2019-12-08 DIAGNOSIS — W1830XA Fall on same level, unspecified, initial encounter: Secondary | ICD-10-CM | POA: Diagnosis not present

## 2019-12-08 DIAGNOSIS — M25561 Pain in right knee: Secondary | ICD-10-CM | POA: Diagnosis not present

## 2019-12-08 DIAGNOSIS — R300 Dysuria: Secondary | ICD-10-CM | POA: Diagnosis not present

## 2019-12-08 DIAGNOSIS — M25562 Pain in left knee: Secondary | ICD-10-CM | POA: Diagnosis not present

## 2019-12-09 DIAGNOSIS — G319 Degenerative disease of nervous system, unspecified: Secondary | ICD-10-CM | POA: Diagnosis not present

## 2019-12-09 DIAGNOSIS — R2689 Other abnormalities of gait and mobility: Secondary | ICD-10-CM | POA: Diagnosis not present

## 2019-12-09 DIAGNOSIS — R42 Dizziness and giddiness: Secondary | ICD-10-CM | POA: Diagnosis not present

## 2019-12-15 DIAGNOSIS — M170 Bilateral primary osteoarthritis of knee: Secondary | ICD-10-CM | POA: Diagnosis not present

## 2019-12-15 DIAGNOSIS — I509 Heart failure, unspecified: Secondary | ICD-10-CM | POA: Diagnosis not present

## 2019-12-15 DIAGNOSIS — E1122 Type 2 diabetes mellitus with diabetic chronic kidney disease: Secondary | ICD-10-CM | POA: Diagnosis not present

## 2019-12-15 DIAGNOSIS — Z952 Presence of prosthetic heart valve: Secondary | ICD-10-CM | POA: Diagnosis not present

## 2019-12-15 DIAGNOSIS — D513 Other dietary vitamin B12 deficiency anemia: Secondary | ICD-10-CM | POA: Diagnosis not present

## 2019-12-15 DIAGNOSIS — N1832 Chronic kidney disease, stage 3b: Secondary | ICD-10-CM | POA: Diagnosis not present

## 2019-12-15 DIAGNOSIS — Z85828 Personal history of other malignant neoplasm of skin: Secondary | ICD-10-CM | POA: Diagnosis not present

## 2019-12-15 DIAGNOSIS — Z950 Presence of cardiac pacemaker: Secondary | ICD-10-CM | POA: Diagnosis not present

## 2019-12-15 DIAGNOSIS — H918X2 Other specified hearing loss, left ear: Secondary | ICD-10-CM | POA: Diagnosis not present

## 2019-12-15 DIAGNOSIS — E785 Hyperlipidemia, unspecified: Secondary | ICD-10-CM | POA: Diagnosis not present

## 2019-12-15 DIAGNOSIS — I4891 Unspecified atrial fibrillation: Secondary | ICD-10-CM | POA: Diagnosis not present

## 2019-12-15 DIAGNOSIS — I130 Hypertensive heart and chronic kidney disease with heart failure and stage 1 through stage 4 chronic kidney disease, or unspecified chronic kidney disease: Secondary | ICD-10-CM | POA: Diagnosis not present

## 2019-12-15 DIAGNOSIS — Z7901 Long term (current) use of anticoagulants: Secondary | ICD-10-CM | POA: Diagnosis not present

## 2019-12-15 DIAGNOSIS — Z7984 Long term (current) use of oral hypoglycemic drugs: Secondary | ICD-10-CM | POA: Diagnosis not present

## 2019-12-15 DIAGNOSIS — Z9181 History of falling: Secondary | ICD-10-CM | POA: Diagnosis not present

## 2019-12-15 DIAGNOSIS — N390 Urinary tract infection, site not specified: Secondary | ICD-10-CM | POA: Diagnosis not present

## 2019-12-15 DIAGNOSIS — Z87440 Personal history of urinary (tract) infections: Secondary | ICD-10-CM | POA: Diagnosis not present

## 2019-12-18 DIAGNOSIS — E1122 Type 2 diabetes mellitus with diabetic chronic kidney disease: Secondary | ICD-10-CM | POA: Diagnosis not present

## 2019-12-18 DIAGNOSIS — Z950 Presence of cardiac pacemaker: Secondary | ICD-10-CM | POA: Diagnosis not present

## 2019-12-18 DIAGNOSIS — Z7901 Long term (current) use of anticoagulants: Secondary | ICD-10-CM | POA: Diagnosis not present

## 2019-12-18 DIAGNOSIS — Z87440 Personal history of urinary (tract) infections: Secondary | ICD-10-CM | POA: Diagnosis not present

## 2019-12-18 DIAGNOSIS — E785 Hyperlipidemia, unspecified: Secondary | ICD-10-CM | POA: Diagnosis not present

## 2019-12-18 DIAGNOSIS — I4891 Unspecified atrial fibrillation: Secondary | ICD-10-CM | POA: Diagnosis not present

## 2019-12-18 DIAGNOSIS — D513 Other dietary vitamin B12 deficiency anemia: Secondary | ICD-10-CM | POA: Diagnosis not present

## 2019-12-18 DIAGNOSIS — H918X2 Other specified hearing loss, left ear: Secondary | ICD-10-CM | POA: Diagnosis not present

## 2019-12-18 DIAGNOSIS — Z85828 Personal history of other malignant neoplasm of skin: Secondary | ICD-10-CM | POA: Diagnosis not present

## 2019-12-18 DIAGNOSIS — M170 Bilateral primary osteoarthritis of knee: Secondary | ICD-10-CM | POA: Diagnosis not present

## 2019-12-18 DIAGNOSIS — Z7984 Long term (current) use of oral hypoglycemic drugs: Secondary | ICD-10-CM | POA: Diagnosis not present

## 2019-12-18 DIAGNOSIS — N390 Urinary tract infection, site not specified: Secondary | ICD-10-CM | POA: Diagnosis not present

## 2019-12-18 DIAGNOSIS — Z9181 History of falling: Secondary | ICD-10-CM | POA: Diagnosis not present

## 2019-12-18 DIAGNOSIS — N1832 Chronic kidney disease, stage 3b: Secondary | ICD-10-CM | POA: Diagnosis not present

## 2019-12-18 DIAGNOSIS — I509 Heart failure, unspecified: Secondary | ICD-10-CM | POA: Diagnosis not present

## 2019-12-18 DIAGNOSIS — Z952 Presence of prosthetic heart valve: Secondary | ICD-10-CM | POA: Diagnosis not present

## 2019-12-18 DIAGNOSIS — I130 Hypertensive heart and chronic kidney disease with heart failure and stage 1 through stage 4 chronic kidney disease, or unspecified chronic kidney disease: Secondary | ICD-10-CM | POA: Diagnosis not present

## 2019-12-23 DIAGNOSIS — Z950 Presence of cardiac pacemaker: Secondary | ICD-10-CM | POA: Diagnosis not present

## 2019-12-23 DIAGNOSIS — N1832 Chronic kidney disease, stage 3b: Secondary | ICD-10-CM | POA: Diagnosis not present

## 2019-12-23 DIAGNOSIS — I509 Heart failure, unspecified: Secondary | ICD-10-CM | POA: Diagnosis not present

## 2019-12-23 DIAGNOSIS — Z85828 Personal history of other malignant neoplasm of skin: Secondary | ICD-10-CM | POA: Diagnosis not present

## 2019-12-23 DIAGNOSIS — Z9181 History of falling: Secondary | ICD-10-CM | POA: Diagnosis not present

## 2019-12-23 DIAGNOSIS — Z87440 Personal history of urinary (tract) infections: Secondary | ICD-10-CM | POA: Diagnosis not present

## 2019-12-23 DIAGNOSIS — I4891 Unspecified atrial fibrillation: Secondary | ICD-10-CM | POA: Diagnosis not present

## 2019-12-23 DIAGNOSIS — M170 Bilateral primary osteoarthritis of knee: Secondary | ICD-10-CM | POA: Diagnosis not present

## 2019-12-23 DIAGNOSIS — Z7901 Long term (current) use of anticoagulants: Secondary | ICD-10-CM | POA: Diagnosis not present

## 2019-12-23 DIAGNOSIS — D513 Other dietary vitamin B12 deficiency anemia: Secondary | ICD-10-CM | POA: Diagnosis not present

## 2019-12-23 DIAGNOSIS — N390 Urinary tract infection, site not specified: Secondary | ICD-10-CM | POA: Diagnosis not present

## 2019-12-23 DIAGNOSIS — Z952 Presence of prosthetic heart valve: Secondary | ICD-10-CM | POA: Diagnosis not present

## 2019-12-23 DIAGNOSIS — E785 Hyperlipidemia, unspecified: Secondary | ICD-10-CM | POA: Diagnosis not present

## 2019-12-23 DIAGNOSIS — H918X2 Other specified hearing loss, left ear: Secondary | ICD-10-CM | POA: Diagnosis not present

## 2019-12-23 DIAGNOSIS — I130 Hypertensive heart and chronic kidney disease with heart failure and stage 1 through stage 4 chronic kidney disease, or unspecified chronic kidney disease: Secondary | ICD-10-CM | POA: Diagnosis not present

## 2019-12-23 DIAGNOSIS — E1122 Type 2 diabetes mellitus with diabetic chronic kidney disease: Secondary | ICD-10-CM | POA: Diagnosis not present

## 2019-12-23 DIAGNOSIS — Z7984 Long term (current) use of oral hypoglycemic drugs: Secondary | ICD-10-CM | POA: Diagnosis not present

## 2019-12-25 DIAGNOSIS — Z85828 Personal history of other malignant neoplasm of skin: Secondary | ICD-10-CM | POA: Diagnosis not present

## 2019-12-25 DIAGNOSIS — I130 Hypertensive heart and chronic kidney disease with heart failure and stage 1 through stage 4 chronic kidney disease, or unspecified chronic kidney disease: Secondary | ICD-10-CM | POA: Diagnosis not present

## 2019-12-25 DIAGNOSIS — E785 Hyperlipidemia, unspecified: Secondary | ICD-10-CM | POA: Diagnosis not present

## 2019-12-25 DIAGNOSIS — Z7984 Long term (current) use of oral hypoglycemic drugs: Secondary | ICD-10-CM | POA: Diagnosis not present

## 2019-12-25 DIAGNOSIS — N390 Urinary tract infection, site not specified: Secondary | ICD-10-CM | POA: Diagnosis not present

## 2019-12-25 DIAGNOSIS — I509 Heart failure, unspecified: Secondary | ICD-10-CM | POA: Diagnosis not present

## 2019-12-25 DIAGNOSIS — I4891 Unspecified atrial fibrillation: Secondary | ICD-10-CM | POA: Diagnosis not present

## 2019-12-25 DIAGNOSIS — Z7901 Long term (current) use of anticoagulants: Secondary | ICD-10-CM | POA: Diagnosis not present

## 2019-12-25 DIAGNOSIS — E1122 Type 2 diabetes mellitus with diabetic chronic kidney disease: Secondary | ICD-10-CM | POA: Diagnosis not present

## 2019-12-25 DIAGNOSIS — N1832 Chronic kidney disease, stage 3b: Secondary | ICD-10-CM | POA: Diagnosis not present

## 2019-12-25 DIAGNOSIS — M170 Bilateral primary osteoarthritis of knee: Secondary | ICD-10-CM | POA: Diagnosis not present

## 2019-12-25 DIAGNOSIS — Z9181 History of falling: Secondary | ICD-10-CM | POA: Diagnosis not present

## 2019-12-25 DIAGNOSIS — Z87440 Personal history of urinary (tract) infections: Secondary | ICD-10-CM | POA: Diagnosis not present

## 2019-12-25 DIAGNOSIS — H918X2 Other specified hearing loss, left ear: Secondary | ICD-10-CM | POA: Diagnosis not present

## 2019-12-25 DIAGNOSIS — Z950 Presence of cardiac pacemaker: Secondary | ICD-10-CM | POA: Diagnosis not present

## 2019-12-25 DIAGNOSIS — Z952 Presence of prosthetic heart valve: Secondary | ICD-10-CM | POA: Diagnosis not present

## 2019-12-25 DIAGNOSIS — D513 Other dietary vitamin B12 deficiency anemia: Secondary | ICD-10-CM | POA: Diagnosis not present

## 2019-12-31 DIAGNOSIS — M170 Bilateral primary osteoarthritis of knee: Secondary | ICD-10-CM | POA: Diagnosis not present

## 2019-12-31 DIAGNOSIS — Z952 Presence of prosthetic heart valve: Secondary | ICD-10-CM | POA: Diagnosis not present

## 2019-12-31 DIAGNOSIS — N1832 Chronic kidney disease, stage 3b: Secondary | ICD-10-CM | POA: Diagnosis not present

## 2019-12-31 DIAGNOSIS — Z7901 Long term (current) use of anticoagulants: Secondary | ICD-10-CM | POA: Diagnosis not present

## 2019-12-31 DIAGNOSIS — Z950 Presence of cardiac pacemaker: Secondary | ICD-10-CM | POA: Diagnosis not present

## 2019-12-31 DIAGNOSIS — H918X2 Other specified hearing loss, left ear: Secondary | ICD-10-CM | POA: Diagnosis not present

## 2019-12-31 DIAGNOSIS — E1122 Type 2 diabetes mellitus with diabetic chronic kidney disease: Secondary | ICD-10-CM | POA: Diagnosis not present

## 2019-12-31 DIAGNOSIS — D513 Other dietary vitamin B12 deficiency anemia: Secondary | ICD-10-CM | POA: Diagnosis not present

## 2019-12-31 DIAGNOSIS — Z7984 Long term (current) use of oral hypoglycemic drugs: Secondary | ICD-10-CM | POA: Diagnosis not present

## 2019-12-31 DIAGNOSIS — E785 Hyperlipidemia, unspecified: Secondary | ICD-10-CM | POA: Diagnosis not present

## 2019-12-31 DIAGNOSIS — Z87440 Personal history of urinary (tract) infections: Secondary | ICD-10-CM | POA: Diagnosis not present

## 2019-12-31 DIAGNOSIS — I4891 Unspecified atrial fibrillation: Secondary | ICD-10-CM | POA: Diagnosis not present

## 2019-12-31 DIAGNOSIS — Z9181 History of falling: Secondary | ICD-10-CM | POA: Diagnosis not present

## 2019-12-31 DIAGNOSIS — I509 Heart failure, unspecified: Secondary | ICD-10-CM | POA: Diagnosis not present

## 2019-12-31 DIAGNOSIS — N390 Urinary tract infection, site not specified: Secondary | ICD-10-CM | POA: Diagnosis not present

## 2019-12-31 DIAGNOSIS — I130 Hypertensive heart and chronic kidney disease with heart failure and stage 1 through stage 4 chronic kidney disease, or unspecified chronic kidney disease: Secondary | ICD-10-CM | POA: Diagnosis not present

## 2019-12-31 DIAGNOSIS — Z85828 Personal history of other malignant neoplasm of skin: Secondary | ICD-10-CM | POA: Diagnosis not present

## 2020-01-02 DIAGNOSIS — E1122 Type 2 diabetes mellitus with diabetic chronic kidney disease: Secondary | ICD-10-CM | POA: Diagnosis not present

## 2020-01-02 DIAGNOSIS — Z7901 Long term (current) use of anticoagulants: Secondary | ICD-10-CM | POA: Diagnosis not present

## 2020-01-02 DIAGNOSIS — M170 Bilateral primary osteoarthritis of knee: Secondary | ICD-10-CM | POA: Diagnosis not present

## 2020-01-02 DIAGNOSIS — I130 Hypertensive heart and chronic kidney disease with heart failure and stage 1 through stage 4 chronic kidney disease, or unspecified chronic kidney disease: Secondary | ICD-10-CM | POA: Diagnosis not present

## 2020-01-02 DIAGNOSIS — E785 Hyperlipidemia, unspecified: Secondary | ICD-10-CM | POA: Diagnosis not present

## 2020-01-02 DIAGNOSIS — H918X2 Other specified hearing loss, left ear: Secondary | ICD-10-CM | POA: Diagnosis not present

## 2020-01-02 DIAGNOSIS — Z952 Presence of prosthetic heart valve: Secondary | ICD-10-CM | POA: Diagnosis not present

## 2020-01-02 DIAGNOSIS — Z7984 Long term (current) use of oral hypoglycemic drugs: Secondary | ICD-10-CM | POA: Diagnosis not present

## 2020-01-02 DIAGNOSIS — Z950 Presence of cardiac pacemaker: Secondary | ICD-10-CM | POA: Diagnosis not present

## 2020-01-02 DIAGNOSIS — Z85828 Personal history of other malignant neoplasm of skin: Secondary | ICD-10-CM | POA: Diagnosis not present

## 2020-01-02 DIAGNOSIS — Z9181 History of falling: Secondary | ICD-10-CM | POA: Diagnosis not present

## 2020-01-02 DIAGNOSIS — N390 Urinary tract infection, site not specified: Secondary | ICD-10-CM | POA: Diagnosis not present

## 2020-01-02 DIAGNOSIS — Z87440 Personal history of urinary (tract) infections: Secondary | ICD-10-CM | POA: Diagnosis not present

## 2020-01-02 DIAGNOSIS — I509 Heart failure, unspecified: Secondary | ICD-10-CM | POA: Diagnosis not present

## 2020-01-02 DIAGNOSIS — D513 Other dietary vitamin B12 deficiency anemia: Secondary | ICD-10-CM | POA: Diagnosis not present

## 2020-01-02 DIAGNOSIS — I4891 Unspecified atrial fibrillation: Secondary | ICD-10-CM | POA: Diagnosis not present

## 2020-01-02 DIAGNOSIS — N1832 Chronic kidney disease, stage 3b: Secondary | ICD-10-CM | POA: Diagnosis not present

## 2020-01-06 DIAGNOSIS — N390 Urinary tract infection, site not specified: Secondary | ICD-10-CM | POA: Diagnosis not present

## 2020-01-06 DIAGNOSIS — Z7984 Long term (current) use of oral hypoglycemic drugs: Secondary | ICD-10-CM | POA: Diagnosis not present

## 2020-01-06 DIAGNOSIS — I509 Heart failure, unspecified: Secondary | ICD-10-CM | POA: Diagnosis not present

## 2020-01-06 DIAGNOSIS — I4891 Unspecified atrial fibrillation: Secondary | ICD-10-CM | POA: Diagnosis not present

## 2020-01-06 DIAGNOSIS — H918X2 Other specified hearing loss, left ear: Secondary | ICD-10-CM | POA: Diagnosis not present

## 2020-01-06 DIAGNOSIS — Z7901 Long term (current) use of anticoagulants: Secondary | ICD-10-CM | POA: Diagnosis not present

## 2020-01-06 DIAGNOSIS — N1832 Chronic kidney disease, stage 3b: Secondary | ICD-10-CM | POA: Diagnosis not present

## 2020-01-06 DIAGNOSIS — E1122 Type 2 diabetes mellitus with diabetic chronic kidney disease: Secondary | ICD-10-CM | POA: Diagnosis not present

## 2020-01-06 DIAGNOSIS — Z9181 History of falling: Secondary | ICD-10-CM | POA: Diagnosis not present

## 2020-01-06 DIAGNOSIS — Z950 Presence of cardiac pacemaker: Secondary | ICD-10-CM | POA: Diagnosis not present

## 2020-01-06 DIAGNOSIS — Z85828 Personal history of other malignant neoplasm of skin: Secondary | ICD-10-CM | POA: Diagnosis not present

## 2020-01-06 DIAGNOSIS — Z87440 Personal history of urinary (tract) infections: Secondary | ICD-10-CM | POA: Diagnosis not present

## 2020-01-06 DIAGNOSIS — I130 Hypertensive heart and chronic kidney disease with heart failure and stage 1 through stage 4 chronic kidney disease, or unspecified chronic kidney disease: Secondary | ICD-10-CM | POA: Diagnosis not present

## 2020-01-06 DIAGNOSIS — Z952 Presence of prosthetic heart valve: Secondary | ICD-10-CM | POA: Diagnosis not present

## 2020-01-06 DIAGNOSIS — E785 Hyperlipidemia, unspecified: Secondary | ICD-10-CM | POA: Diagnosis not present

## 2020-01-06 DIAGNOSIS — D513 Other dietary vitamin B12 deficiency anemia: Secondary | ICD-10-CM | POA: Diagnosis not present

## 2020-01-06 DIAGNOSIS — M170 Bilateral primary osteoarthritis of knee: Secondary | ICD-10-CM | POA: Diagnosis not present

## 2020-01-08 DIAGNOSIS — K5900 Constipation, unspecified: Secondary | ICD-10-CM | POA: Diagnosis not present

## 2020-01-08 DIAGNOSIS — R9431 Abnormal electrocardiogram [ECG] [EKG]: Secondary | ICD-10-CM | POA: Diagnosis not present

## 2020-01-08 DIAGNOSIS — I10 Essential (primary) hypertension: Secondary | ICD-10-CM | POA: Diagnosis not present

## 2020-01-08 DIAGNOSIS — I4891 Unspecified atrial fibrillation: Secondary | ICD-10-CM | POA: Diagnosis not present

## 2020-01-08 DIAGNOSIS — M170 Bilateral primary osteoarthritis of knee: Secondary | ICD-10-CM | POA: Diagnosis not present

## 2020-01-08 DIAGNOSIS — Z79899 Other long term (current) drug therapy: Secondary | ICD-10-CM | POA: Diagnosis not present

## 2020-01-08 DIAGNOSIS — N390 Urinary tract infection, site not specified: Secondary | ICD-10-CM | POA: Diagnosis not present

## 2020-01-08 DIAGNOSIS — E119 Type 2 diabetes mellitus without complications: Secondary | ICD-10-CM | POA: Diagnosis not present

## 2020-01-13 DIAGNOSIS — M170 Bilateral primary osteoarthritis of knee: Secondary | ICD-10-CM | POA: Diagnosis not present

## 2020-01-13 DIAGNOSIS — I130 Hypertensive heart and chronic kidney disease with heart failure and stage 1 through stage 4 chronic kidney disease, or unspecified chronic kidney disease: Secondary | ICD-10-CM | POA: Diagnosis not present

## 2020-01-13 DIAGNOSIS — I509 Heart failure, unspecified: Secondary | ICD-10-CM | POA: Diagnosis not present

## 2020-01-13 DIAGNOSIS — N390 Urinary tract infection, site not specified: Secondary | ICD-10-CM | POA: Diagnosis not present

## 2020-01-13 DIAGNOSIS — Z9181 History of falling: Secondary | ICD-10-CM | POA: Diagnosis not present

## 2020-01-13 DIAGNOSIS — E1122 Type 2 diabetes mellitus with diabetic chronic kidney disease: Secondary | ICD-10-CM | POA: Diagnosis not present

## 2020-01-13 DIAGNOSIS — Z7901 Long term (current) use of anticoagulants: Secondary | ICD-10-CM | POA: Diagnosis not present

## 2020-01-13 DIAGNOSIS — H918X2 Other specified hearing loss, left ear: Secondary | ICD-10-CM | POA: Diagnosis not present

## 2020-01-13 DIAGNOSIS — Z87440 Personal history of urinary (tract) infections: Secondary | ICD-10-CM | POA: Diagnosis not present

## 2020-01-13 DIAGNOSIS — Z85828 Personal history of other malignant neoplasm of skin: Secondary | ICD-10-CM | POA: Diagnosis not present

## 2020-01-13 DIAGNOSIS — N1832 Chronic kidney disease, stage 3b: Secondary | ICD-10-CM | POA: Diagnosis not present

## 2020-01-13 DIAGNOSIS — Z952 Presence of prosthetic heart valve: Secondary | ICD-10-CM | POA: Diagnosis not present

## 2020-01-13 DIAGNOSIS — D513 Other dietary vitamin B12 deficiency anemia: Secondary | ICD-10-CM | POA: Diagnosis not present

## 2020-01-13 DIAGNOSIS — Z950 Presence of cardiac pacemaker: Secondary | ICD-10-CM | POA: Diagnosis not present

## 2020-01-13 DIAGNOSIS — E785 Hyperlipidemia, unspecified: Secondary | ICD-10-CM | POA: Diagnosis not present

## 2020-01-13 DIAGNOSIS — Z7984 Long term (current) use of oral hypoglycemic drugs: Secondary | ICD-10-CM | POA: Diagnosis not present

## 2020-01-13 DIAGNOSIS — I4891 Unspecified atrial fibrillation: Secondary | ICD-10-CM | POA: Diagnosis not present

## 2020-01-20 DIAGNOSIS — Z87440 Personal history of urinary (tract) infections: Secondary | ICD-10-CM | POA: Diagnosis not present

## 2020-01-20 DIAGNOSIS — Z9181 History of falling: Secondary | ICD-10-CM | POA: Diagnosis not present

## 2020-01-20 DIAGNOSIS — Z7984 Long term (current) use of oral hypoglycemic drugs: Secondary | ICD-10-CM | POA: Diagnosis not present

## 2020-01-20 DIAGNOSIS — Z950 Presence of cardiac pacemaker: Secondary | ICD-10-CM | POA: Diagnosis not present

## 2020-01-20 DIAGNOSIS — N1832 Chronic kidney disease, stage 3b: Secondary | ICD-10-CM | POA: Diagnosis not present

## 2020-01-20 DIAGNOSIS — Z85828 Personal history of other malignant neoplasm of skin: Secondary | ICD-10-CM | POA: Diagnosis not present

## 2020-01-20 DIAGNOSIS — E785 Hyperlipidemia, unspecified: Secondary | ICD-10-CM | POA: Diagnosis not present

## 2020-01-20 DIAGNOSIS — H918X2 Other specified hearing loss, left ear: Secondary | ICD-10-CM | POA: Diagnosis not present

## 2020-01-20 DIAGNOSIS — N390 Urinary tract infection, site not specified: Secondary | ICD-10-CM | POA: Diagnosis not present

## 2020-01-20 DIAGNOSIS — Z952 Presence of prosthetic heart valve: Secondary | ICD-10-CM | POA: Diagnosis not present

## 2020-01-20 DIAGNOSIS — D513 Other dietary vitamin B12 deficiency anemia: Secondary | ICD-10-CM | POA: Diagnosis not present

## 2020-01-20 DIAGNOSIS — E1122 Type 2 diabetes mellitus with diabetic chronic kidney disease: Secondary | ICD-10-CM | POA: Diagnosis not present

## 2020-01-20 DIAGNOSIS — Z7901 Long term (current) use of anticoagulants: Secondary | ICD-10-CM | POA: Diagnosis not present

## 2020-01-20 DIAGNOSIS — I509 Heart failure, unspecified: Secondary | ICD-10-CM | POA: Diagnosis not present

## 2020-01-20 DIAGNOSIS — M170 Bilateral primary osteoarthritis of knee: Secondary | ICD-10-CM | POA: Diagnosis not present

## 2020-01-20 DIAGNOSIS — I130 Hypertensive heart and chronic kidney disease with heart failure and stage 1 through stage 4 chronic kidney disease, or unspecified chronic kidney disease: Secondary | ICD-10-CM | POA: Diagnosis not present

## 2020-01-20 DIAGNOSIS — I4891 Unspecified atrial fibrillation: Secondary | ICD-10-CM | POA: Diagnosis not present

## 2020-01-28 DIAGNOSIS — Z950 Presence of cardiac pacemaker: Secondary | ICD-10-CM | POA: Diagnosis not present

## 2020-01-28 DIAGNOSIS — Z9181 History of falling: Secondary | ICD-10-CM | POA: Diagnosis not present

## 2020-01-28 DIAGNOSIS — E785 Hyperlipidemia, unspecified: Secondary | ICD-10-CM | POA: Diagnosis not present

## 2020-01-28 DIAGNOSIS — N390 Urinary tract infection, site not specified: Secondary | ICD-10-CM | POA: Diagnosis not present

## 2020-01-28 DIAGNOSIS — M170 Bilateral primary osteoarthritis of knee: Secondary | ICD-10-CM | POA: Diagnosis not present

## 2020-01-28 DIAGNOSIS — Z87440 Personal history of urinary (tract) infections: Secondary | ICD-10-CM | POA: Diagnosis not present

## 2020-01-28 DIAGNOSIS — Z85828 Personal history of other malignant neoplasm of skin: Secondary | ICD-10-CM | POA: Diagnosis not present

## 2020-01-28 DIAGNOSIS — E1122 Type 2 diabetes mellitus with diabetic chronic kidney disease: Secondary | ICD-10-CM | POA: Diagnosis not present

## 2020-01-28 DIAGNOSIS — D513 Other dietary vitamin B12 deficiency anemia: Secondary | ICD-10-CM | POA: Diagnosis not present

## 2020-01-28 DIAGNOSIS — Z952 Presence of prosthetic heart valve: Secondary | ICD-10-CM | POA: Diagnosis not present

## 2020-01-28 DIAGNOSIS — I4891 Unspecified atrial fibrillation: Secondary | ICD-10-CM | POA: Diagnosis not present

## 2020-01-28 DIAGNOSIS — Z7901 Long term (current) use of anticoagulants: Secondary | ICD-10-CM | POA: Diagnosis not present

## 2020-01-28 DIAGNOSIS — I509 Heart failure, unspecified: Secondary | ICD-10-CM | POA: Diagnosis not present

## 2020-01-28 DIAGNOSIS — H918X2 Other specified hearing loss, left ear: Secondary | ICD-10-CM | POA: Diagnosis not present

## 2020-01-28 DIAGNOSIS — N1832 Chronic kidney disease, stage 3b: Secondary | ICD-10-CM | POA: Diagnosis not present

## 2020-01-28 DIAGNOSIS — Z7984 Long term (current) use of oral hypoglycemic drugs: Secondary | ICD-10-CM | POA: Diagnosis not present

## 2020-01-28 DIAGNOSIS — I130 Hypertensive heart and chronic kidney disease with heart failure and stage 1 through stage 4 chronic kidney disease, or unspecified chronic kidney disease: Secondary | ICD-10-CM | POA: Diagnosis not present

## 2020-02-03 ENCOUNTER — Ambulatory Visit (INDEPENDENT_AMBULATORY_CARE_PROVIDER_SITE_OTHER): Payer: Medicare Other

## 2020-02-03 DIAGNOSIS — Z87440 Personal history of urinary (tract) infections: Secondary | ICD-10-CM | POA: Diagnosis not present

## 2020-02-03 DIAGNOSIS — Z9181 History of falling: Secondary | ICD-10-CM | POA: Diagnosis not present

## 2020-02-03 DIAGNOSIS — D513 Other dietary vitamin B12 deficiency anemia: Secondary | ICD-10-CM | POA: Diagnosis not present

## 2020-02-03 DIAGNOSIS — E785 Hyperlipidemia, unspecified: Secondary | ICD-10-CM | POA: Diagnosis not present

## 2020-02-03 DIAGNOSIS — Z950 Presence of cardiac pacemaker: Secondary | ICD-10-CM | POA: Diagnosis not present

## 2020-02-03 DIAGNOSIS — I495 Sick sinus syndrome: Secondary | ICD-10-CM | POA: Diagnosis not present

## 2020-02-03 DIAGNOSIS — E1122 Type 2 diabetes mellitus with diabetic chronic kidney disease: Secondary | ICD-10-CM | POA: Diagnosis not present

## 2020-02-03 DIAGNOSIS — M170 Bilateral primary osteoarthritis of knee: Secondary | ICD-10-CM | POA: Diagnosis not present

## 2020-02-03 DIAGNOSIS — I509 Heart failure, unspecified: Secondary | ICD-10-CM | POA: Diagnosis not present

## 2020-02-03 DIAGNOSIS — N1832 Chronic kidney disease, stage 3b: Secondary | ICD-10-CM | POA: Diagnosis not present

## 2020-02-03 DIAGNOSIS — I4891 Unspecified atrial fibrillation: Secondary | ICD-10-CM | POA: Diagnosis not present

## 2020-02-03 DIAGNOSIS — Z7984 Long term (current) use of oral hypoglycemic drugs: Secondary | ICD-10-CM | POA: Diagnosis not present

## 2020-02-03 DIAGNOSIS — Z952 Presence of prosthetic heart valve: Secondary | ICD-10-CM | POA: Diagnosis not present

## 2020-02-03 DIAGNOSIS — H918X2 Other specified hearing loss, left ear: Secondary | ICD-10-CM | POA: Diagnosis not present

## 2020-02-03 DIAGNOSIS — N390 Urinary tract infection, site not specified: Secondary | ICD-10-CM | POA: Diagnosis not present

## 2020-02-03 DIAGNOSIS — Z7901 Long term (current) use of anticoagulants: Secondary | ICD-10-CM | POA: Diagnosis not present

## 2020-02-03 DIAGNOSIS — Z85828 Personal history of other malignant neoplasm of skin: Secondary | ICD-10-CM | POA: Diagnosis not present

## 2020-02-03 DIAGNOSIS — I130 Hypertensive heart and chronic kidney disease with heart failure and stage 1 through stage 4 chronic kidney disease, or unspecified chronic kidney disease: Secondary | ICD-10-CM | POA: Diagnosis not present

## 2020-02-03 LAB — CUP PACEART REMOTE DEVICE CHECK
Battery Remaining Longevity: 20 mo
Battery Remaining Percentage: 25 %
Battery Voltage: 2.8 V
Brady Statistic AP VP Percent: 29 %
Brady Statistic AP VS Percent: 59 %
Brady Statistic AS VP Percent: 2.9 %
Brady Statistic AS VS Percent: 9.2 %
Brady Statistic RA Percent Paced: 65 %
Brady Statistic RV Percent Paced: 42 %
Date Time Interrogation Session: 20211207031258
Implantable Lead Implant Date: 20050526
Implantable Lead Implant Date: 20050526
Implantable Lead Location: 753859
Implantable Lead Location: 753860
Implantable Lead Model: 5076
Implantable Pulse Generator Implant Date: 20140204
Lead Channel Impedance Value: 340 Ohm
Lead Channel Impedance Value: 430 Ohm
Lead Channel Pacing Threshold Amplitude: 1 V
Lead Channel Pacing Threshold Amplitude: 1.625 V
Lead Channel Pacing Threshold Pulse Width: 0.5 ms
Lead Channel Pacing Threshold Pulse Width: 1 ms
Lead Channel Sensing Intrinsic Amplitude: 1 mV
Lead Channel Sensing Intrinsic Amplitude: 10 mV
Lead Channel Setting Pacing Amplitude: 2.5 V
Lead Channel Setting Pacing Amplitude: 3.125
Lead Channel Setting Pacing Pulse Width: 1 ms
Lead Channel Setting Sensing Sensitivity: 1 mV
Pulse Gen Model: 2210
Pulse Gen Serial Number: 7445049

## 2020-02-10 DIAGNOSIS — D513 Other dietary vitamin B12 deficiency anemia: Secondary | ICD-10-CM | POA: Diagnosis not present

## 2020-02-10 DIAGNOSIS — E785 Hyperlipidemia, unspecified: Secondary | ICD-10-CM | POA: Diagnosis not present

## 2020-02-10 DIAGNOSIS — H918X2 Other specified hearing loss, left ear: Secondary | ICD-10-CM | POA: Diagnosis not present

## 2020-02-10 DIAGNOSIS — I4891 Unspecified atrial fibrillation: Secondary | ICD-10-CM | POA: Diagnosis not present

## 2020-02-10 DIAGNOSIS — I509 Heart failure, unspecified: Secondary | ICD-10-CM | POA: Diagnosis not present

## 2020-02-10 DIAGNOSIS — E1122 Type 2 diabetes mellitus with diabetic chronic kidney disease: Secondary | ICD-10-CM | POA: Diagnosis not present

## 2020-02-10 DIAGNOSIS — Z87440 Personal history of urinary (tract) infections: Secondary | ICD-10-CM | POA: Diagnosis not present

## 2020-02-10 DIAGNOSIS — I130 Hypertensive heart and chronic kidney disease with heart failure and stage 1 through stage 4 chronic kidney disease, or unspecified chronic kidney disease: Secondary | ICD-10-CM | POA: Diagnosis not present

## 2020-02-10 DIAGNOSIS — M170 Bilateral primary osteoarthritis of knee: Secondary | ICD-10-CM | POA: Diagnosis not present

## 2020-02-10 DIAGNOSIS — Z9181 History of falling: Secondary | ICD-10-CM | POA: Diagnosis not present

## 2020-02-10 DIAGNOSIS — N1832 Chronic kidney disease, stage 3b: Secondary | ICD-10-CM | POA: Diagnosis not present

## 2020-02-10 DIAGNOSIS — N390 Urinary tract infection, site not specified: Secondary | ICD-10-CM | POA: Diagnosis not present

## 2020-02-10 DIAGNOSIS — Z85828 Personal history of other malignant neoplasm of skin: Secondary | ICD-10-CM | POA: Diagnosis not present

## 2020-02-10 DIAGNOSIS — Z950 Presence of cardiac pacemaker: Secondary | ICD-10-CM | POA: Diagnosis not present

## 2020-02-10 DIAGNOSIS — Z7984 Long term (current) use of oral hypoglycemic drugs: Secondary | ICD-10-CM | POA: Diagnosis not present

## 2020-02-10 DIAGNOSIS — Z7901 Long term (current) use of anticoagulants: Secondary | ICD-10-CM | POA: Diagnosis not present

## 2020-02-10 DIAGNOSIS — Z952 Presence of prosthetic heart valve: Secondary | ICD-10-CM | POA: Diagnosis not present

## 2020-02-16 NOTE — Progress Notes (Signed)
Remote pacemaker transmission.   

## 2020-02-24 DIAGNOSIS — R58 Hemorrhage, not elsewhere classified: Secondary | ICD-10-CM | POA: Diagnosis not present

## 2020-02-24 DIAGNOSIS — S199XXA Unspecified injury of neck, initial encounter: Secondary | ICD-10-CM | POA: Diagnosis not present

## 2020-02-24 DIAGNOSIS — I160 Hypertensive urgency: Secondary | ICD-10-CM | POA: Diagnosis not present

## 2020-02-24 DIAGNOSIS — H748X1 Other specified disorders of right middle ear and mastoid: Secondary | ICD-10-CM | POA: Diagnosis not present

## 2020-02-24 DIAGNOSIS — H9221 Otorrhagia, right ear: Secondary | ICD-10-CM | POA: Diagnosis not present

## 2020-02-24 DIAGNOSIS — S01311A Laceration without foreign body of right ear, initial encounter: Secondary | ICD-10-CM | POA: Diagnosis not present

## 2020-02-24 DIAGNOSIS — I609 Nontraumatic subarachnoid hemorrhage, unspecified: Secondary | ICD-10-CM | POA: Diagnosis not present

## 2020-02-24 DIAGNOSIS — I10 Essential (primary) hypertension: Secondary | ICD-10-CM | POA: Diagnosis not present

## 2020-02-24 DIAGNOSIS — S0990XA Unspecified injury of head, initial encounter: Secondary | ICD-10-CM | POA: Diagnosis not present

## 2020-02-24 DIAGNOSIS — S0101XA Laceration without foreign body of scalp, initial encounter: Secondary | ICD-10-CM | POA: Diagnosis not present

## 2020-02-24 DIAGNOSIS — R001 Bradycardia, unspecified: Secondary | ICD-10-CM | POA: Diagnosis not present

## 2020-02-24 DIAGNOSIS — R52 Pain, unspecified: Secondary | ICD-10-CM | POA: Diagnosis not present

## 2020-02-24 DIAGNOSIS — S066X0A Traumatic subarachnoid hemorrhage without loss of consciousness, initial encounter: Secondary | ICD-10-CM | POA: Diagnosis not present

## 2020-02-25 DIAGNOSIS — J90 Pleural effusion, not elsewhere classified: Secondary | ICD-10-CM | POA: Diagnosis not present

## 2020-02-25 DIAGNOSIS — Z7901 Long term (current) use of anticoagulants: Secondary | ICD-10-CM | POA: Diagnosis not present

## 2020-02-25 DIAGNOSIS — I110 Hypertensive heart disease with heart failure: Secondary | ICD-10-CM | POA: Diagnosis not present

## 2020-02-25 DIAGNOSIS — S0990XA Unspecified injury of head, initial encounter: Secondary | ICD-10-CM | POA: Diagnosis not present

## 2020-02-25 DIAGNOSIS — S3983XA Other specified injuries of pelvis, initial encounter: Secondary | ICD-10-CM | POA: Diagnosis not present

## 2020-02-25 DIAGNOSIS — S066X1A Traumatic subarachnoid hemorrhage with loss of consciousness of 30 minutes or less, initial encounter: Secondary | ICD-10-CM | POA: Diagnosis not present

## 2020-02-25 DIAGNOSIS — E119 Type 2 diabetes mellitus without complications: Secondary | ICD-10-CM | POA: Diagnosis not present

## 2020-02-25 DIAGNOSIS — M47812 Spondylosis without myelopathy or radiculopathy, cervical region: Secondary | ICD-10-CM | POA: Diagnosis not present

## 2020-02-25 DIAGNOSIS — I509 Heart failure, unspecified: Secondary | ICD-10-CM | POA: Diagnosis not present

## 2020-02-25 DIAGNOSIS — S1989XA Other specified injuries of other specified part of neck, initial encounter: Secondary | ICD-10-CM | POA: Diagnosis not present

## 2020-02-25 DIAGNOSIS — S00431A Contusion of right ear, initial encounter: Secondary | ICD-10-CM | POA: Diagnosis not present

## 2020-02-25 DIAGNOSIS — S298XXA Other specified injuries of thorax, initial encounter: Secondary | ICD-10-CM | POA: Diagnosis not present

## 2020-02-25 DIAGNOSIS — W1830XA Fall on same level, unspecified, initial encounter: Secondary | ICD-10-CM | POA: Diagnosis not present

## 2020-02-25 DIAGNOSIS — R935 Abnormal findings on diagnostic imaging of other abdominal regions, including retroperitoneum: Secondary | ICD-10-CM | POA: Diagnosis not present

## 2020-02-25 DIAGNOSIS — I480 Paroxysmal atrial fibrillation: Secondary | ICD-10-CM | POA: Diagnosis not present

## 2020-02-25 DIAGNOSIS — Z953 Presence of xenogenic heart valve: Secondary | ICD-10-CM | POA: Diagnosis not present

## 2020-02-25 DIAGNOSIS — S066X0A Traumatic subarachnoid hemorrhage without loss of consciousness, initial encounter: Secondary | ICD-10-CM | POA: Diagnosis not present

## 2020-02-25 DIAGNOSIS — M25552 Pain in left hip: Secondary | ICD-10-CM | POA: Diagnosis not present

## 2020-02-25 DIAGNOSIS — S098XXA Other specified injuries of head, initial encounter: Secondary | ICD-10-CM | POA: Diagnosis not present

## 2020-02-25 DIAGNOSIS — I2510 Atherosclerotic heart disease of native coronary artery without angina pectoris: Secondary | ICD-10-CM | POA: Diagnosis not present

## 2020-02-25 DIAGNOSIS — S0101XA Laceration without foreign body of scalp, initial encounter: Secondary | ICD-10-CM | POA: Diagnosis not present

## 2020-02-25 DIAGNOSIS — S3981XA Other specified injuries of abdomen, initial encounter: Secondary | ICD-10-CM | POA: Diagnosis not present

## 2020-02-25 DIAGNOSIS — Z950 Presence of cardiac pacemaker: Secondary | ICD-10-CM | POA: Diagnosis not present

## 2020-03-03 ENCOUNTER — Other Ambulatory Visit: Payer: Self-pay | Admitting: Cardiovascular Disease

## 2020-03-08 DIAGNOSIS — R2689 Other abnormalities of gait and mobility: Secondary | ICD-10-CM | POA: Diagnosis not present

## 2020-03-08 DIAGNOSIS — Z4802 Encounter for removal of sutures: Secondary | ICD-10-CM | POA: Diagnosis not present

## 2020-03-08 DIAGNOSIS — S0101XA Laceration without foreign body of scalp, initial encounter: Secondary | ICD-10-CM | POA: Diagnosis not present

## 2020-03-08 DIAGNOSIS — W01190S Fall on same level from slipping, tripping and stumbling with subsequent striking against furniture, sequela: Secondary | ICD-10-CM | POA: Diagnosis not present

## 2020-03-08 DIAGNOSIS — I10 Essential (primary) hypertension: Secondary | ICD-10-CM | POA: Diagnosis not present

## 2020-03-08 DIAGNOSIS — E119 Type 2 diabetes mellitus without complications: Secondary | ICD-10-CM | POA: Diagnosis not present

## 2020-03-28 ENCOUNTER — Other Ambulatory Visit: Payer: Self-pay | Admitting: Cardiovascular Disease

## 2020-03-29 DIAGNOSIS — R001 Bradycardia, unspecified: Secondary | ICD-10-CM | POA: Diagnosis not present

## 2020-03-29 DIAGNOSIS — R42 Dizziness and giddiness: Secondary | ICD-10-CM | POA: Diagnosis not present

## 2020-04-25 ENCOUNTER — Other Ambulatory Visit: Payer: Self-pay | Admitting: Cardiovascular Disease

## 2020-04-28 ENCOUNTER — Other Ambulatory Visit: Payer: Self-pay | Admitting: Cardiovascular Disease

## 2020-05-04 ENCOUNTER — Ambulatory Visit (INDEPENDENT_AMBULATORY_CARE_PROVIDER_SITE_OTHER): Payer: Medicare Other

## 2020-05-04 DIAGNOSIS — I495 Sick sinus syndrome: Secondary | ICD-10-CM

## 2020-05-07 LAB — CUP PACEART REMOTE DEVICE CHECK
Battery Remaining Longevity: 11 mo
Battery Remaining Percentage: 15 %
Battery Voltage: 2.74 V
Brady Statistic AP VP Percent: 28 %
Brady Statistic AP VS Percent: 59 %
Brady Statistic AS VP Percent: 2.8 %
Brady Statistic AS VS Percent: 10 %
Brady Statistic RA Percent Paced: 65 %
Brady Statistic RV Percent Paced: 41 %
Date Time Interrogation Session: 20220310162143
Implantable Lead Implant Date: 20050526
Implantable Lead Implant Date: 20050526
Implantable Lead Location: 753859
Implantable Lead Location: 753860
Implantable Lead Model: 5076
Implantable Pulse Generator Implant Date: 20140204
Lead Channel Impedance Value: 340 Ohm
Lead Channel Impedance Value: 410 Ohm
Lead Channel Pacing Threshold Amplitude: 1 V
Lead Channel Pacing Threshold Amplitude: 1.5 V
Lead Channel Pacing Threshold Pulse Width: 0.5 ms
Lead Channel Pacing Threshold Pulse Width: 1 ms
Lead Channel Sensing Intrinsic Amplitude: 0.8 mV
Lead Channel Sensing Intrinsic Amplitude: 9.5 mV
Lead Channel Setting Pacing Amplitude: 2.5 V
Lead Channel Setting Pacing Amplitude: 2.5 V
Lead Channel Setting Pacing Pulse Width: 1 ms
Lead Channel Setting Sensing Sensitivity: 1 mV
Pulse Gen Model: 2210
Pulse Gen Serial Number: 7445049

## 2020-05-10 ENCOUNTER — Encounter: Payer: Medicare Other | Admitting: Cardiovascular Disease

## 2020-05-12 NOTE — Progress Notes (Signed)
Remote pacemaker transmission.   

## 2020-05-18 ENCOUNTER — Ambulatory Visit (INDEPENDENT_AMBULATORY_CARE_PROVIDER_SITE_OTHER): Payer: Medicare Other | Admitting: Cardiovascular Disease

## 2020-05-18 ENCOUNTER — Other Ambulatory Visit: Payer: Self-pay

## 2020-05-18 ENCOUNTER — Encounter: Payer: Self-pay | Admitting: Cardiovascular Disease

## 2020-05-18 VITALS — BP 126/70 | HR 71 | Ht 64.5 in | Wt 146.0 lb

## 2020-05-18 DIAGNOSIS — N183 Chronic kidney disease, stage 3 unspecified: Secondary | ICD-10-CM

## 2020-05-18 DIAGNOSIS — Z7901 Long term (current) use of anticoagulants: Secondary | ICD-10-CM | POA: Diagnosis not present

## 2020-05-18 DIAGNOSIS — I10 Essential (primary) hypertension: Secondary | ICD-10-CM | POA: Diagnosis not present

## 2020-05-18 DIAGNOSIS — I2510 Atherosclerotic heart disease of native coronary artery without angina pectoris: Secondary | ICD-10-CM | POA: Diagnosis not present

## 2020-05-18 DIAGNOSIS — E7801 Familial hypercholesterolemia: Secondary | ICD-10-CM

## 2020-05-18 DIAGNOSIS — Z950 Presence of cardiac pacemaker: Secondary | ICD-10-CM

## 2020-05-18 DIAGNOSIS — Z953 Presence of xenogenic heart valve: Secondary | ICD-10-CM

## 2020-05-18 DIAGNOSIS — E1122 Type 2 diabetes mellitus with diabetic chronic kidney disease: Secondary | ICD-10-CM

## 2020-05-18 DIAGNOSIS — I480 Paroxysmal atrial fibrillation: Secondary | ICD-10-CM | POA: Diagnosis not present

## 2020-05-18 DIAGNOSIS — I495 Sick sinus syndrome: Secondary | ICD-10-CM | POA: Diagnosis not present

## 2020-05-18 NOTE — Patient Instructions (Signed)
Medication Instructions:  No changes *If you need a refill on your cardiac medications before your next appointment, please call your pharmacy*   Lab Work: Your provider would like for you to have the following labs today: lipid  If you have labs (blood work) drawn today and your tests are completely normal, you will receive your results only by: Marland Kitchen MyChart Message (if you have MyChart) OR . A paper copy in the mail If you have any lab test that is abnormal or we need to change your treatment, we will call you to review the results.   Testing/Procedures: None ordered   Follow-Up: At [facility], you and your health needs are our priority.  As part of our continuing mission to provide you with exceptional heart care, we have created designated Provider Care Teams.  These Care Teams include your primary Cardiologist (physician) and Advanced Practice Providers (APPs -  Physician Assistants and Nurse Practitioners) who all work together to provide you with the care you need, when you need it.  We recommend signing up for the patient portal called "MyChart".  Sign up information is provided on this After Visit Summary.  MyChart is used to connect with patients for Virtual Visits (Telemedicine).  Patients are able to view lab/test results, encounter notes, upcoming appointments, etc.  Non-urgent messages can be sent to your provider as well.   To learn more about what you can do with MyChart, go to [email or web address].    Your next appointment:   9 month(s)  The format for your next appointment:   In Person  Provider:   You may see Sanda Klein, MD or one of the following Advanced Practice Providers on your designated Care Team:    Almyra Deforest, PA-C  Fabian Sharp, PA-C or   Roby Lofts, [place]

## 2020-05-18 NOTE — Progress Notes (Signed)
Patient ID: [name], female   DOB: [birth year], 87 y.o.   MRN: [id number]     Cardiology Office Note    Date:  05/25/2020   ID:  [name], DOB 11-22-1929, MRN [id number]  PCP:  Imagene Riches, NP  Cardiologist:   Sanda Klein, MD   No chief complaint on file.   History of Present Illness:  [name] is a 87 y.o. female with a history of paroxysmal atrial fibrillation on chronic anticoagulation, sinus node dysfunction s/p dual chamber pacemaker (leads 2005, generator change 2014 [employer]), valvular heart disease with a biological aortic valve prosthesis (21 mm, 2005, Dr. Servando Snare), severe hypercholesterolemia. She has not had clinical coronary disease, but preoperative cardiac catheterization in 2005 showed 40% LAD stenosis.   She is generally doing quite well without any major cardiovascular complaints.  She has had some dizziness, and we checked orthostatic vital signs today.  There is no change in her blood pressure from sitting to standing or from laying to standing.  The patient specifically denies any chest pain at rest exertion, dyspnea at rest or with exertion, orthopnea, paroxysmal nocturnal dyspnea, syncope, palpitations, focal neurological deficits, intermittent claudication, lower extremity edema, unexplained weight gain, cough, hemoptysis or wheezing.  Pacemaker interrogation shows normal device function. She has a Insurance claims handler implanted in 2014.  As before, atrial lead parameters are excellent and she has a slightly high ventricular lead capture threshold (stable).  Estimated generator longevity is about 9 months.  She has 65% atrial pacing and 41% ventricular pacing.  She is not pacemaker dependent.  Over the years has had varying prevalence of atrial fibrillation, usually around 5-10%.  She is actually in atrial fibrillation today, with good ventricular rate control (ventricular paced rhythm).  As before, she tends to pace the ventricle more  frequently when she is in atrial fibrillation compared to sinus rhythm.  Completely unaware of the change in rhythm.  Her most recent echocardiogram was performed in October July 2019.  The ventricular systolic function is normal, gradients across aortic valve prosthesis (peak 20, mean 10) are normal and she has trivial central aortic insufficiency (there is also report of trivial perivalvular insufficiency which I do not agree with).  She has not been able to tolerate statins after trying multiple different agents.  She also was intolerant of Zetia labs performed today again show severe hypercholesterolemia.  On treatment with Repatha, she has had some improvement in her LDL cholesterol, but less than one would expect.  She probably has heterozygous familial hypercholesterolemia.   Past Medical History:  Diagnosis Date  . Anemia   . Anxiety   . CAD (coronary artery disease)   . CHF (congestive heart failure) (HCC)    EF 55% on 01/05/11, trace AR w/ Bioprosthetic valve  . DM2 (diabetes mellitus, type 2) ([place])   . GERD (gastroesophageal reflux disease)   . HLD (hyperlipidemia)   . HTN (hypertension)   . PAF (paroxysmal atrial fibrillation) ([place])   . Restless legs syndrome (RLS)   . Tobacco abuse     Past Surgical History:  Procedure Laterality Date  . AORTIC VALVE REPLACEMENT  08/25/2002   tissue valve  . CARDIOVERSION N/A 05/05/2014   Procedure: CARDIOVERSION;  Surgeon: Dorothy Spark, MD;  Location: [facility];  Service: Cardiovascular;  Laterality: N/A;  . PACEMAKER INSERTION  04/02/2012   [employer]  . PERMANENT PACEMAKER GENERATOR CHANGE N/A 04/02/2012   Procedure: PERMANENT PACEMAKER GENERATOR CHANGE;  Surgeon:  Sanda Klein, MD;  Location: [facility];  Service: Cardiovascular;  Laterality: N/A;    Outpatient Medications Prior to Visit  Medication Sig Dispense Refill  . ALPRAZolam (XANAX) 1 MG tablet Take 0.5 tablets (0.5 mg total) by mouth 2 (two) times daily as needed for  anxiety. 60 tablet 0  . apixaban (ELIQUIS) 2.5 MG TABS tablet TAKE 1 TABLET (2.5 MG TOTAL) BY MOUTH 2 (TWO) TIMES DAILY. 60 tablet 11  . benazepril (LOTENSIN) 20 MG tablet TAKE 1 TABLET BY MOUTH TWICE A DAY 180 tablet 2  . Blood Glucose Monitoring Suppl (ONE TOUCH ULTRA 2) W/DEVICE KIT   0  . ezetimibe (ZETIA) 10 MG tablet TAKE 1 TABLET (10 MG TOTAL) BY MOUTH DAILY. 90 tablet 0  . ferrous sulfate 325 (65 FE) MG tablet Take 1 tablet (325 mg total) by mouth 2 (two) times daily with a meal. (Patient taking differently: Take 650 mg by mouth daily with breakfast.) 60 tablet 3  . fluticasone (FLONASE) 50 MCG/ACT nasal spray Place 2 sprays into both nostrils daily. 16 g 5  . furosemide (LASIX) 40 MG tablet TAKE 1 TABLET BY MOUTH TWICE A DAY AS NEEDED FOR SWELLING OR SHORTNESS OF BREATH 60 tablet 2  . metFORMIN (GLUCOPHAGE) 500 MG tablet TAKE 1 TABLET (500 MG TOTAL) BY MOUTH 2 (TWO) TIMES DAILY WITH A MEAL. 180 tablet 3  . metoprolol tartrate (LOPRESSOR) 100 MG tablet Take 1 tablet (100 mg total) by mouth 2 (two) times daily. 60 tablet 5  . omeprazole (PRILOSEC) 20 MG capsule TAKE 1 CAPSULE BY MOUTH EVERY DAY 90 capsule 0  . ONE TOUCH ULTRA TEST test strip TEST BLOOD SUGAR TWICE A DAY 50 each 4  . REPATHA SURECLICK 408 MG/ML SOAJ INJECT 140 MG INTO THE SKIN EVERY 14 (FOURTEEN) DAYS. 2 pen 11  . diltiazem (CARDIZEM CD) 240 MG 24 hr capsule Take 1 capsule (240 mg total) by mouth daily. NEED OV. 30 capsule 0   No facility-administered medications prior to visit.     Allergies:   Pravachol [pravastatin sodium] and Tape   Social History   Socioeconomic History  . Marital status: Widowed    Spouse name: Not on file  . Number of children: 5  . Years of education: 5  . Highest education level: Not on file  Occupational History  . Occupation: Museum/gallery exhibitions officer: RETIRED  Tobacco Use  . Smoking status: Never Smoker  . Smokeless tobacco: Current User    Types: Snuff  Substance and Sexual  Activity  . Alcohol use: No    Alcohol/week: 0.0 standard drinks  . Drug use: No  . Sexual activity: Not on file  Other Topics Concern  . Not on file  Social History Narrative   Health Care POA:    Emergency Contact: daughter, [name] [phone]   End of Life Plan: gave pt ad pamphlet   Who lives with you: daughter and 2 grandchildren   Any pets: 2 dogs   Diet: pt has a varied diet of protein, starch and vegetables.   Exercise: pt garden's daily   Seatbelts: pt does not wear seatbelt because of pace maker   Sun Exposure/Protection: pt does not use sun protection, has irregular dermatology appointments.   Hobbies: gardening, sitting on porch.         Social Determinants of Health   Financial Resource Strain: Not on file  Food Insecurity: Not on file  Transportation Needs: Not on file  Physical Activity: Not  on file  Stress: Not on file  Social Connections: Not on file     Family History:  The patient's family history includes Heart attack in her brother; Kidney disease in her sister.   ROS:   Please see the history of present illness.    ROS All other systems are reviewed and are negative.  PHYSICAL EXAM:   VS:  BP 126/70   Pulse 71   Ht 5' 4.5" (1.638 m)   Wt 146 lb (66.2 kg)   SpO2 96%   BMI 24.67 kg/m       General: Alert, oriented x3, no distress, healthy subclavian pacemaker site Head: no evidence of trauma, PERRL, EOMI, no exophtalmos or lid lag, no myxedema, no xanthelasma; normal ears, nose and oropharynx Neck: normal jugular venous pulsations and no hepatojugular reflux; brisk carotid pulses without delay and with bilateral carotid bruits that appear to radiate from the chest Chest: clear to auscultation, no signs of consolidation by percussion or palpation, normal fremitus, symmetrical and full respiratory excursions Cardiovascular: normal position and quality of the apical impulse, regular rhythm, normal first and second heart sounds, early peaking 3/6  aortic ejection murmur.  No systolic murmurs, rubs or gallops Abdomen: no tenderness or distention, no masses by palpation, no abnormal pulsatility or arterial bruits, normal bowel sounds, no hepatosplenomegaly Extremities: no clubbing, cyanosis or edema; 2+ radial, ulnar and brachial pulses bilaterally; 2+ right femoral, posterior tibial and dorsalis pedis pulses; 2+ left femoral, posterior tibial and dorsalis pedis pulses; no subclavian or femoral bruits Neurological: grossly nonfocal Psych: Normal mood and affect     Wt Readings from Last 3 Encounters:  05/18/20 146 lb (66.2 kg)  12/23/18 154 lb (69.9 kg)  09/04/17 152 lb (68.9 kg)      Studies/Labs Reviewed:  EKG:  EKG is  ordered today.   It shows atrial fibrillation with 100% ventricular paced rhythm  Recent Labs: Labs from 11/29/2018 show normal liver function tests and normal TSH, hemoglobin 11.5, creatinine 1.46  Labs from 01/08/2020 from PCP are significant for potassium 4.1, creatinine 1.40, normal liver function tests, hemoglobin A1c 5.5%, hemoglobin 11.6 Total cholesterol 284, LDL 180, triglycerides 124, HDL 55  BMET    Component Value Date/Time   NA 138 12/16/2015 0916   K 3.6 12/16/2015 0916   CL 101 12/16/2015 0916   CO2 29 12/16/2015 0916   GLUCOSE 139 (H) 12/16/2015 0916   BUN 24 (H) 12/16/2015 0916   CREATININE 1.56 (H) 12/16/2015 0916   CREATININE 1.27 (H) 09/02/2015 1124   CALCIUM 9.0 12/16/2015 0916   GFRNONAA 30 (L) 12/16/2015 0916   GFRNONAA 39 (L) 09/02/2015 1124   GFRAA 34 (L) 12/16/2015 0916   GFRAA 45 (L) 09/02/2015 1124   Lipid Panel    Component Value Date/Time   CHOL 225 (H) 05/18/2020 1537   TRIG 171 (H) 05/18/2020 1537   HDL 61 05/18/2020 1537   CHOLHDL 3.7 05/18/2020 1537   CHOLHDL 6.8 Ratio 04/06/2009 2033   VLDL 41 (H) 04/06/2009 2033   LDLCALC 134 (H) 05/18/2020 1537   LDLDIRECT 141 (H) 06/14/2015 1507   Prior to initiating Repatha on 01/08/2020 total cholesterol 284, LDL 180,  HDL 55, triglycerides 224  ASSESSMENT:    1. Paroxysmal atrial fibrillation (HCC)   2. SSS (sick sinus syndrome) ([place])   3. Pacemaker   4. Coronary artery disease involving native coronary artery of native heart without angina pectoris   5. Heterozygous familial hypercholesterolemia   6. Controlled type  2 diabetes mellitus with stage 3 chronic kidney disease, without long-term current use of insulin ([place])   7. Essential hypertension   8. History of aortic valve replacement with bioprosthetic valve   9. Long term (current) use of anticoagulants   10. CKD stage 3 due to type 2 diabetes mellitus ([place])      PLAN:  In order of problems listed above:  1. Atrial flutter/atrial fibrillation: She has had remarkably variable burden of atrial fibrillation.  At one point she was in sustained atrial fibrillation for well over 3 months and then had less than 1% atrial fibrillation for a whole year, but on the average spends about 5-10% of the time in atrial fibrillation.  The atrial fib is consistently asymptomatic, but does lead to increased ventricular pacing.  She never has RVR.  She is on anticoagulants CHADSVasc score 6 (age 31, gender, HBP, vascular disease, DM).  Antiarrhythmic therapy has never seemed appropriate in the absence of symptoms. 2. SSS: The current sensor settings appear to be associated with appropriate heart rate histogram distribution. 3. Pacemaker.  Continue remote downloads every 3 months.  Anticipate need for generator change around the end of this year. 4. CAD: Asymptomatic.  Minor coronary artery disease by cardiac catheterization in 2005.  5. 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  Started Repatha and LDL decreased from 180 to 134 intolerant to statins and ezetimibe.  Will refer to lipid clinic for adjustment of treatment regimen.  Consider Nextletol. 6. DM: Excellent control.  Hemoglobin A1c was 5.5% on  01/08/2020 7. HTN: Well-controlled.  No evidence of orthostatic hypotension today. 8. AVR: Her murmur has always been quite loud but with stable transprosthetic gradients.  It has been almost 3 years since her last echocardiogram and we will repeat this soon. 9. Chronic anticoagulation: Eliquis dose adjusted for age and small body habitus.  Most recent hemoglobin on 01/08/2020 was 11.6. 10. CKD: Baseline GFR approximately 35 mL/minute based on the most recent tests.  Stable.    Medication Adjustments/Labs and Tests Ordered: Current medicines are reviewed at length with the patient today.  Concerns regarding medicines are outlined above.  Medication changes, Labs and Tests ordered today are listed in the Patient Instructions below. Patient Instructions  Medication Instructions:  No changes *If you need a refill on your cardiac medications before your next appointment, please call your pharmacy*   Lab Work: Your provider would like for you to have the following labs today: lipid  If you have labs (blood work) drawn today and your tests are completely normal, you will receive your results only by: Marland Kitchen MyChart Message (if you have MyChart) OR . A paper copy in the mail If you have any lab test that is abnormal or we need to change your treatment, we will call you to review the results.   Testing/Procedures: None ordered   Follow-Up: At [facility], you and your health needs are our priority.  As part of our continuing mission to provide you with exceptional heart care, we have created designated Provider Care Teams.  These Care Teams include your primary Cardiologist (physician) and Advanced Practice Providers (APPs -  Physician Assistants and Nurse Practitioners) who all work together to provide you with the care you need, when you need it.  We recommend signing up for the patient portal called "MyChart".  Sign up information is provided on this After Visit Summary.  MyChart is used to  connect with patients  for Virtual Visits (Telemedicine).  Patients are able to view lab/test results, encounter notes, upcoming appointments, etc.  Non-urgent messages can be sent to your provider as well.   To learn more about what you can do with MyChart, go to [email or web address].    Your next appointment:   9 month(s)  The format for your next appointment:   In Person  Provider:   You may see Sanda Klein, MD or one of the following Advanced Practice Providers on your designated Care Team:    Almyra Deforest, PA-C  Fabian Sharp, [place] or   Roby Lofts, PA-C         Signed, Sanda Klein, MD  05/25/2020 [facility], [place], [place]  [address] Phone: [phone]; Fax: [phone]

## 2020-05-19 LAB — LIPID PANEL
Chol/HDL Ratio: 3.7 ratio (ref 0.0–4.4)
Cholesterol, Total: 225 mg/dL — ABNORMAL HIGH (ref 100–199)
HDL: 61 mg/dL (ref >39)
LDL Chol Calc (NIH): 134 mg/dL — ABNORMAL HIGH (ref 0–99)
Triglycerides: 171 mg/dL — ABNORMAL HIGH (ref 0–149)
VLDL Cholesterol Cal: 30 mg/dL (ref 5–40)

## 2020-05-23 ENCOUNTER — Other Ambulatory Visit: Payer: Self-pay | Admitting: Cardiovascular Disease

## 2020-06-07 ENCOUNTER — Ambulatory Visit (INDEPENDENT_AMBULATORY_CARE_PROVIDER_SITE_OTHER): Payer: Medicare Other

## 2020-06-07 DIAGNOSIS — I495 Sick sinus syndrome: Secondary | ICD-10-CM

## 2020-06-09 LAB — CUP PACEART REMOTE DEVICE CHECK
Battery Remaining Longevity: 5 mo
Battery Remaining Percentage: 8 %
Battery Voltage: 2.69 V
Brady Statistic AP VP Percent: 8.3 %
Brady Statistic AP VS Percent: 83 %
Brady Statistic AS VP Percent: 1 %
Brady Statistic AS VS Percent: 8.8 %
Brady Statistic RA Percent Paced: 24 %
Brady Statistic RV Percent Paced: 56 %
Date Time Interrogation Session: 20220412133804
Implantable Lead Implant Date: 20050526
Implantable Lead Implant Date: 20050526
Implantable Lead Location: 753859
Implantable Lead Location: 753860
Implantable Lead Model: 5076
Implantable Pulse Generator Implant Date: 20140204
Lead Channel Impedance Value: 300 Ohm
Lead Channel Impedance Value: 390 Ohm
Lead Channel Pacing Threshold Amplitude: 1.25 V
Lead Channel Pacing Threshold Amplitude: 1.625 V
Lead Channel Pacing Threshold Pulse Width: 0.5 ms
Lead Channel Pacing Threshold Pulse Width: 1 ms
Lead Channel Sensing Intrinsic Amplitude: 0.9 mV
Lead Channel Sensing Intrinsic Amplitude: 8.6 mV
Lead Channel Setting Pacing Amplitude: 2.5 V
Lead Channel Setting Pacing Amplitude: 3.125
Lead Channel Setting Pacing Pulse Width: 1 ms
Lead Channel Setting Sensing Sensitivity: 1 mV
Pulse Gen Model: 2210
Pulse Gen Serial Number: 7445049

## 2020-06-21 ENCOUNTER — Ambulatory Visit (HOSPITAL_COMMUNITY): Payer: Medicare Other

## 2020-06-21 ENCOUNTER — Telehealth (HOSPITAL_COMMUNITY): Payer: Self-pay | Admitting: Cardiovascular Disease

## 2020-06-21 NOTE — Telephone Encounter (Signed)
Patient's  Echocardiogram was cancelled  for reason below:   06/21/20 06/21/2020 9:03 AM ZD:KEUV, ASHLEY N  Cancel Rsn: Patient (daughter teresa canceling due to pt being sick. Will callback to reschedule when pt is feeling better.)   Order will be removed from the [facility] and when patient calls back we will reinstate the order. Thank you

## 2020-06-21 NOTE — Telephone Encounter (Signed)
Sent to primary nurse as FYI 

## 2020-06-22 DIAGNOSIS — K5900 Constipation, unspecified: Secondary | ICD-10-CM | POA: Diagnosis not present

## 2020-06-22 DIAGNOSIS — E119 Type 2 diabetes mellitus without complications: Secondary | ICD-10-CM | POA: Diagnosis not present

## 2020-06-22 DIAGNOSIS — N390 Urinary tract infection, site not specified: Secondary | ICD-10-CM | POA: Diagnosis not present

## 2020-06-22 DIAGNOSIS — M170 Bilateral primary osteoarthritis of knee: Secondary | ICD-10-CM | POA: Diagnosis not present

## 2020-06-22 DIAGNOSIS — I4891 Unspecified atrial fibrillation: Secondary | ICD-10-CM | POA: Diagnosis not present

## 2020-06-22 DIAGNOSIS — D513 Other dietary vitamin B12 deficiency anemia: Secondary | ICD-10-CM | POA: Diagnosis not present

## 2020-06-22 DIAGNOSIS — I10 Essential (primary) hypertension: Secondary | ICD-10-CM | POA: Diagnosis not present

## 2020-06-22 DIAGNOSIS — R9431 Abnormal electrocardiogram [ECG] [EKG]: Secondary | ICD-10-CM | POA: Diagnosis not present

## 2020-06-22 DIAGNOSIS — Z79899 Other long term (current) drug therapy: Secondary | ICD-10-CM | POA: Diagnosis not present

## 2020-06-22 NOTE — Progress Notes (Signed)
Remote pacemaker transmission.   

## 2020-06-22 NOTE — Addendum Note (Signed)
Addended by: Douglass Rivers D on: 06/22/2020 12:42 PM   Modules accepted: Level of Service

## 2020-07-12 ENCOUNTER — Ambulatory Visit (INDEPENDENT_AMBULATORY_CARE_PROVIDER_SITE_OTHER): Payer: Medicare Other

## 2020-07-12 DIAGNOSIS — I495 Sick sinus syndrome: Secondary | ICD-10-CM

## 2020-07-16 LAB — CUP PACEART REMOTE DEVICE CHECK
Battery Remaining Longevity: 1 mo
Battery Remaining Percentage: 1 %
Battery Voltage: 2.63 V
Brady Statistic AP VP Percent: 36 %
Brady Statistic AP VS Percent: 60 %
Brady Statistic AS VP Percent: 1 %
Brady Statistic AS VS Percent: 3.2 %
Brady Statistic RA Percent Paced: 67 %
Brady Statistic RV Percent Paced: 47 %
Date Time Interrogation Session: 20220519153102
Implantable Lead Implant Date: 20050526
Implantable Lead Implant Date: 20050526
Implantable Lead Location: 753859
Implantable Lead Location: 753860
Implantable Lead Model: 5076
Implantable Pulse Generator Implant Date: 20140204
Lead Channel Impedance Value: 340 Ohm
Lead Channel Impedance Value: 410 Ohm
Lead Channel Pacing Threshold Amplitude: 1.25 V
Lead Channel Pacing Threshold Amplitude: 1.625 V
Lead Channel Pacing Threshold Pulse Width: 0.5 ms
Lead Channel Pacing Threshold Pulse Width: 1 ms
Lead Channel Sensing Intrinsic Amplitude: 1.7 mV
Lead Channel Sensing Intrinsic Amplitude: 8.8 mV
Lead Channel Setting Pacing Amplitude: 2.5 V
Lead Channel Setting Pacing Amplitude: 3.125
Lead Channel Setting Pacing Pulse Width: 1 ms
Lead Channel Setting Sensing Sensitivity: 1 mV
Pulse Gen Model: 2210
Pulse Gen Serial Number: 7445049

## 2020-07-22 DIAGNOSIS — C44219 Basal cell carcinoma of skin of left ear and external auricular canal: Secondary | ICD-10-CM | POA: Diagnosis not present

## 2020-08-03 ENCOUNTER — Ambulatory Visit (INDEPENDENT_AMBULATORY_CARE_PROVIDER_SITE_OTHER): Payer: Medicare Other

## 2020-08-03 DIAGNOSIS — I495 Sick sinus syndrome: Secondary | ICD-10-CM | POA: Diagnosis not present

## 2020-08-04 NOTE — Addendum Note (Signed)
Addended by: Douglass Rivers D on: 08/04/2020 10:37 AM   Modules accepted: Level of Service

## 2020-08-04 NOTE — Progress Notes (Signed)
Remote pacemaker transmission.   

## 2020-08-06 LAB — CUP PACEART REMOTE DEVICE CHECK
Battery Remaining Longevity: 1 mo
Battery Remaining Percentage: 0.5 %
Battery Voltage: 2.62 V
Brady Statistic AP VP Percent: 37 %
Brady Statistic AP VS Percent: 59 %
Brady Statistic AS VP Percent: 1 %
Brady Statistic AS VS Percent: 3.6 %
Brady Statistic RA Percent Paced: 73 %
Brady Statistic RV Percent Paced: 46 %
Date Time Interrogation Session: 20220609165621
Implantable Lead Implant Date: 20050526
Implantable Lead Implant Date: 20050526
Implantable Lead Location: 753859
Implantable Lead Location: 753860
Implantable Lead Model: 5076
Implantable Pulse Generator Implant Date: 20140204
Lead Channel Impedance Value: 340 Ohm
Lead Channel Impedance Value: 400 Ohm
Lead Channel Pacing Threshold Amplitude: 1.25 V
Lead Channel Pacing Threshold Amplitude: 1.625 V
Lead Channel Pacing Threshold Pulse Width: 0.5 ms
Lead Channel Pacing Threshold Pulse Width: 1 ms
Lead Channel Sensing Intrinsic Amplitude: 0.7 mV
Lead Channel Sensing Intrinsic Amplitude: 8.1 mV
Lead Channel Setting Pacing Amplitude: 2.5 V
Lead Channel Setting Pacing Amplitude: 3.125
Lead Channel Setting Pacing Pulse Width: 1 ms
Lead Channel Setting Sensing Sensitivity: 1 mV
Pulse Gen Model: 2210
Pulse Gen Serial Number: 7445049

## 2020-08-25 NOTE — Progress Notes (Signed)
Remote pacemaker transmission.   

## 2020-08-27 ENCOUNTER — Ambulatory Visit (HOSPITAL_COMMUNITY): Payer: Medicare Other | Attending: Cardiovascular Disease

## 2020-08-27 ENCOUNTER — Other Ambulatory Visit: Payer: Self-pay

## 2020-08-27 DIAGNOSIS — Z953 Presence of xenogenic heart valve: Secondary | ICD-10-CM | POA: Diagnosis not present

## 2020-08-27 LAB — ECHOCARDIOGRAM COMPLETE
AR max vel: 0.81 cm2
AV Area VTI: 0.79 cm2
AV Area mean vel: 0.79 cm2
AV Mean grad: 13.5 mmHg
AV Peak grad: 24.3 mmHg
Ao pk vel: 2.47 m/s
Area-P 1/2: 5.74 cm2
S' Lateral: 3.2 cm

## 2020-08-31 ENCOUNTER — Encounter: Payer: Self-pay | Admitting: *Deleted

## 2020-09-06 ENCOUNTER — Ambulatory Visit (INDEPENDENT_AMBULATORY_CARE_PROVIDER_SITE_OTHER): Payer: Medicare Other

## 2020-09-06 DIAGNOSIS — I495 Sick sinus syndrome: Secondary | ICD-10-CM

## 2020-09-07 ENCOUNTER — Telehealth: Payer: Self-pay

## 2020-09-07 DIAGNOSIS — I495 Sick sinus syndrome: Secondary | ICD-10-CM

## 2020-09-07 DIAGNOSIS — Z01818 Encounter for other preprocedural examination: Secondary | ICD-10-CM

## 2020-09-07 LAB — CUP PACEART REMOTE DEVICE CHECK
Battery Remaining Longevity: 0 mo
Battery Voltage: 2.59 V
Brady Statistic AP VP Percent: 38 %
Brady Statistic AP VS Percent: 58 %
Brady Statistic AS VP Percent: 1 %
Brady Statistic AS VS Percent: 4.2 %
Brady Statistic RA Percent Paced: 68 %
Brady Statistic RV Percent Paced: 49 %
Date Time Interrogation Session: 20220712102529
Implantable Lead Implant Date: 20050526
Implantable Lead Implant Date: 20050526
Implantable Lead Location: 753859
Implantable Lead Location: 753860
Implantable Lead Model: 5076
Implantable Pulse Generator Implant Date: 20140204
Lead Channel Impedance Value: 300 Ohm
Lead Channel Impedance Value: 400 Ohm
Lead Channel Pacing Threshold Amplitude: 1.25 V
Lead Channel Pacing Threshold Amplitude: 1.5 V
Lead Channel Pacing Threshold Pulse Width: 0.5 ms
Lead Channel Pacing Threshold Pulse Width: 1 ms
Lead Channel Sensing Intrinsic Amplitude: 0.9 mV
Lead Channel Sensing Intrinsic Amplitude: 9.2 mV
Lead Channel Setting Pacing Amplitude: 2.5 V
Lead Channel Setting Pacing Amplitude: 2.5 V
Lead Channel Setting Pacing Pulse Width: 1 ms
Lead Channel Setting Sensing Sensitivity: 1 mV
Pulse Gen Model: 2210
Pulse Gen Serial Number: 7445049

## 2020-09-07 NOTE — Telephone Encounter (Signed)
Battery voltage 2.59V, ERI triggered on 09/02/20.  Spoke to patients daughter (DPR, [name]) advised device has reached ERI and patient will need to come into office to discuss gen change. Advised someone will call to set up apt. Appreciative of call.

## 2020-09-10 ENCOUNTER — Encounter: Payer: Self-pay | Admitting: *Deleted

## 2020-09-10 NOTE — Telephone Encounter (Signed)
Spoke with the patient about the generator change procedure. Instructions will be made as well. She has been advised to call with any further questions.     Generator Change Procedure Instructions  You are scheduled for a Generator Change (battery change) on  10/11/20  with Dr. Sallyanne Kuster.  1. Please arrive at the [facility], Entrance "A"  at [facility] at  11:30 am on the day of your procedure. (The address is [address]  2. DIET: You may have a light, early breakfast the morning of your procedure. NOTHING TO EAT AFTER 8:00 AM.  3. LABS: Your provider would like for you to return on 10/05/20 to have the following labs drawn: CBC and BMET. You do not need an appointment for the lab. Once in our office lobby there is a podium where you can sign in and ring the doorbell to alert [place] that you are here. The lab is open from 8:00 am to 4:30 pm; closed for lunch from 12:45pm-1:45pm. You do not need to be fasting.   4. MEDICATIONS:  Hold the Eliquis on Sunday 8/14 (both doses) and on Monday morning (8/15) Hold the Furosemide the morning of the procedure  5.  Plan for an overnight stay.  Bring your insurance cards and a list of you medications.  6.  Wash your chest and neck with surgical scrub the evening before and the morning of your procedure.  Rinse well. Please review the surgical scrub instruction sheet given to you.   7. Your chest will need to be shaved prior to this procedure (if needed). We ask that you do this yourself at home 1 to 2 days before or if uncomfortable/unable to do yourself, then it will be performed by the hospital staff the day of.  * Special note:  Every effort is made to have your procedure done on time.  Occasionally there are emergencies that present themselves at the hospital that may cause delays.  Please be patient if a delay does occur.                                                                                                           * If  you have any questions after you get home, please call Lattie Haw, RN at [phone].    [facility] - Preparing For Surgery  Before surgery, you can play an important role. Because skin is not sterile, your skin needs to be as free of germs as possible. You can reduce the number of germs on your skin by washing with CHG (chlorahexidine gluconate) Soap before surgery.  CHG is an antiseptic cleaner which kills germs and bonds with the skin to continue killing germs even after washing.   Please do not use if you have an allergy to CHG or antibacterial soaps.  If your skin becomes reddened/irritated stop using the CHG.   Do not shave (including legs and underarms) for at least 48 hours prior to first CHG shower.  It is OK to shave your face.  Please  follow these instructions carefully:  1.  Shower the night before surgery and the morning of surgery with CHG.  2.  If you choose to wash your hair, wash your hair first as usual with your normal shampoo.  3.  After you shampoo, rinse your hair and body thoroughly to remove the shampoo.  4.  Use CHG as you would any other liquid soap.  You can apply CHG directly to the skin and wash gently with a clean washcloth. 5.  Apply the CHG Soap to your body ONLY FROM THE NECK DOWN.  Do not use on open wounds or open sores.  Avoid contact with your eyes, ears, mouth and genitals (private parts).    6.  Wash thoroughly, paying special attention to the area where your surgery will be performed.  7.  Thoroughly rinse your body with warm water from the neck down.   8.  DO NOT shower/wash with your normal soap after using and rinsing off the CHG soap.  9.  Pat yourself dry with a clean towel.   10.  Wear clean pajamas.   11.  Place clean sheets on your bed the night of your first shower and do not sleep with pets.  Day of Surgery: Do not apply any deodorants/lotions.  Please wear clean clothes to the hospital/surgery center.

## 2020-09-13 DIAGNOSIS — M170 Bilateral primary osteoarthritis of knee: Secondary | ICD-10-CM | POA: Diagnosis not present

## 2020-09-13 DIAGNOSIS — D513 Other dietary vitamin B12 deficiency anemia: Secondary | ICD-10-CM | POA: Diagnosis not present

## 2020-09-13 DIAGNOSIS — R9431 Abnormal electrocardiogram [ECG] [EKG]: Secondary | ICD-10-CM | POA: Diagnosis not present

## 2020-09-13 DIAGNOSIS — Z79899 Other long term (current) drug therapy: Secondary | ICD-10-CM | POA: Diagnosis not present

## 2020-09-13 DIAGNOSIS — K5900 Constipation, unspecified: Secondary | ICD-10-CM | POA: Diagnosis not present

## 2020-09-13 DIAGNOSIS — I4891 Unspecified atrial fibrillation: Secondary | ICD-10-CM | POA: Diagnosis not present

## 2020-09-13 DIAGNOSIS — E119 Type 2 diabetes mellitus without complications: Secondary | ICD-10-CM | POA: Diagnosis not present

## 2020-09-13 DIAGNOSIS — N390 Urinary tract infection, site not specified: Secondary | ICD-10-CM | POA: Diagnosis not present

## 2020-09-13 DIAGNOSIS — I10 Essential (primary) hypertension: Secondary | ICD-10-CM | POA: Diagnosis not present

## 2020-09-24 ENCOUNTER — Other Ambulatory Visit: Payer: Self-pay | Admitting: Cardiovascular Disease

## 2020-09-28 NOTE — Progress Notes (Signed)
Remote pacemaker transmission.   

## 2020-09-28 NOTE — Addendum Note (Signed)
Addended by: Douglass Rivers D on: 09/28/2020 02:06 PM   Modules accepted: Level of Service

## 2020-10-07 DIAGNOSIS — I495 Sick sinus syndrome: Secondary | ICD-10-CM | POA: Diagnosis not present

## 2020-10-07 DIAGNOSIS — Z01818 Encounter for other preprocedural examination: Secondary | ICD-10-CM | POA: Diagnosis not present

## 2020-10-08 ENCOUNTER — Encounter: Payer: Self-pay | Admitting: *Deleted

## 2020-10-08 LAB — BASIC METABOLIC PANEL
BUN/Creatinine Ratio: 15 (ref 12–28)
BUN: 24 mg/dL (ref 8–27)
CO2: 23 mmol/L (ref 20–29)
Calcium: 9.4 mg/dL (ref 8.7–10.3)
Chloride: 104 mmol/L (ref 96–106)
Creatinine, Ser: 1.58 mg/dL — ABNORMAL HIGH (ref 0.57–1.00)
Glucose: 90 mg/dL (ref 65–99)
Potassium: 5.5 mmol/L — ABNORMAL HIGH (ref 3.5–5.2)
Sodium: 143 mmol/L (ref 134–144)
eGFR: 31 mL/min/{1.73_m2} — ABNORMAL LOW (ref >59)

## 2020-10-08 LAB — CBC
Hematocrit: 35.4 % (ref 34.0–46.6)
Hemoglobin: 11.2 g/dL (ref 11.1–15.9)
MCH: 25.7 pg — ABNORMAL LOW (ref 26.6–33.0)
MCHC: 31.6 g/dL (ref 31.5–35.7)
MCV: 81 fL (ref 79–97)
Platelets: 257 10*3/uL (ref 150–450)
RBC: 4.36 x10E6/uL (ref 3.77–5.28)
RDW: 14.5 % (ref 11.7–15.4)
WBC: 10.5 10*3/uL (ref 3.4–10.8)

## 2020-10-10 ENCOUNTER — Other Ambulatory Visit: Payer: Self-pay | Admitting: *Deleted

## 2020-10-10 DIAGNOSIS — I495 Sick sinus syndrome: Secondary | ICD-10-CM

## 2020-10-11 ENCOUNTER — Ambulatory Visit (HOSPITAL_COMMUNITY)
Admission: RE | Admit: 2020-10-11 | Discharge: 2020-10-11 | Disposition: A | Discharge status: Home / Self Care | Payer: Medicare Other | Attending: Cardiovascular Disease | Admitting: Cardiovascular Disease

## 2020-10-11 ENCOUNTER — Other Ambulatory Visit: Payer: Self-pay

## 2020-10-11 ENCOUNTER — Encounter (HOSPITAL_COMMUNITY): Payer: Self-pay | Admitting: Cardiovascular Disease

## 2020-10-11 ENCOUNTER — Ambulatory Visit: Payer: Medicare Other

## 2020-10-11 ENCOUNTER — Encounter (HOSPITAL_COMMUNITY)
Admission: RE | Disposition: A | Discharge status: Home / Self Care | Payer: Self-pay | Source: Home / Self Care | Attending: Cardiovascular Disease

## 2020-10-11 DIAGNOSIS — I509 Heart failure, unspecified: Secondary | ICD-10-CM | POA: Insufficient documentation

## 2020-10-11 DIAGNOSIS — I4892 Unspecified atrial flutter: Secondary | ICD-10-CM | POA: Insufficient documentation

## 2020-10-11 DIAGNOSIS — Z888 Allergy status to other drugs, medicaments and biological substances status: Secondary | ICD-10-CM | POA: Diagnosis not present

## 2020-10-11 DIAGNOSIS — Z7901 Long term (current) use of anticoagulants: Secondary | ICD-10-CM | POA: Insufficient documentation

## 2020-10-11 DIAGNOSIS — E7800 Pure hypercholesterolemia, unspecified: Secondary | ICD-10-CM | POA: Insufficient documentation

## 2020-10-11 DIAGNOSIS — Z45010 Encounter for checking and testing of cardiac pacemaker pulse generator [battery]: Secondary | ICD-10-CM | POA: Diagnosis not present

## 2020-10-11 DIAGNOSIS — I495 Sick sinus syndrome: Secondary | ICD-10-CM | POA: Diagnosis not present

## 2020-10-11 DIAGNOSIS — N189 Chronic kidney disease, unspecified: Secondary | ICD-10-CM | POA: Insufficient documentation

## 2020-10-11 DIAGNOSIS — E1122 Type 2 diabetes mellitus with diabetic chronic kidney disease: Secondary | ICD-10-CM | POA: Diagnosis not present

## 2020-10-11 DIAGNOSIS — I2510 Atherosclerotic heart disease of native coronary artery without angina pectoris: Secondary | ICD-10-CM | POA: Insufficient documentation

## 2020-10-11 DIAGNOSIS — I480 Paroxysmal atrial fibrillation: Secondary | ICD-10-CM | POA: Insufficient documentation

## 2020-10-11 DIAGNOSIS — Z952 Presence of prosthetic heart valve: Secondary | ICD-10-CM | POA: Diagnosis not present

## 2020-10-11 DIAGNOSIS — F17290 Nicotine dependence, other tobacco product, uncomplicated: Secondary | ICD-10-CM | POA: Insufficient documentation

## 2020-10-11 DIAGNOSIS — I130 Hypertensive heart and chronic kidney disease with heart failure and stage 1 through stage 4 chronic kidney disease, or unspecified chronic kidney disease: Secondary | ICD-10-CM | POA: Diagnosis not present

## 2020-10-11 HISTORY — PX: PPM GENERATOR CHANGEOUT: EP1233

## 2020-10-11 LAB — GLUCOSE, CAPILLARY: Glucose-Capillary: 110 mg/dL — ABNORMAL HIGH (ref 70–99)

## 2020-10-11 SURGERY — PPM GENERATOR CHANGEOUT

## 2020-10-11 MED ORDER — CHLORHEXIDINE GLUCONATE 4 % EX LIQD: 4.0000 "application " | Freq: Once | CUTANEOUS | Status: DC

## 2020-10-11 MED ORDER — SODIUM CHLORIDE 0.9% FLUSH: 3.0000 mL | INTRAVENOUS | Status: DC | PRN

## 2020-10-11 MED ORDER — SODIUM CHLORIDE 0.9 % IV SOLN
INTRAVENOUS | Status: AC
  Filled 2020-10-11: qty 2

## 2020-10-11 MED ORDER — APIXABAN 2.5 MG PO TABS: ORAL_TABLET | ORAL | 11 refills | Status: DC

## 2020-10-11 MED ORDER — SODIUM CHLORIDE 0.9 % IV SOLN
80.0000 mg | INTRAVENOUS | Status: AC
  Administered 2020-10-11: 80 mg

## 2020-10-11 MED ORDER — LIDOCAINE HCL (PF) 1 % IJ SOLN
INTRAMUSCULAR | Status: DC | PRN
  Administered 2020-10-11: 30 mL

## 2020-10-11 MED ORDER — SODIUM CHLORIDE 0.9 % IV SOLN: 250.0000 mL | INTRAVENOUS | Status: DC | PRN

## 2020-10-11 MED ORDER — SODIUM CHLORIDE 0.9% FLUSH: 3.0000 mL | Freq: Two times a day (BID) | INTRAVENOUS | Status: DC

## 2020-10-11 MED ORDER — ACETAMINOPHEN 325 MG PO TABS: 325.0000 mg | ORAL_TABLET | ORAL | Status: DC | PRN

## 2020-10-11 MED ORDER — CEFAZOLIN SODIUM-DEXTROSE 2-4 GM/100ML-% IV SOLN
INTRAVENOUS | Status: AC
  Filled 2020-10-11: qty 100

## 2020-10-11 MED ORDER — ONDANSETRON HCL 4 MG/2ML IJ SOLN
4.0000 mg | Freq: Four times a day (QID) | INTRAMUSCULAR | Status: DC | PRN
Start: 2020-10-11 — End: 2020-10-11

## 2020-10-11 MED ORDER — SODIUM CHLORIDE 0.9 % IV SOLN: INTRAVENOUS | Status: DC

## 2020-10-11 MED ORDER — LIDOCAINE HCL (PF) 1 % IJ SOLN
INTRAMUSCULAR | Status: AC
  Filled 2020-10-11: qty 60

## 2020-10-11 MED ORDER — CEFAZOLIN SODIUM-DEXTROSE 2-4 GM/100ML-% IV SOLN
2.0000 g | INTRAVENOUS | Status: AC
  Administered 2020-10-11: 2 g via INTRAVENOUS

## 2020-10-11 MED ORDER — FLUTICASONE PROPIONATE 50 MCG/ACT NA SUSP: 1.0000 | Freq: Every day | NASAL | Status: DC | PRN

## 2020-10-11 MED ORDER — FERROUS SULFATE 325 (65 FE) MG PO TABS: 325.0000 mg | ORAL_TABLET | Freq: Every day | ORAL | Status: DC

## 2020-10-11 SURGICAL SUPPLY — 4 items
CABLE SURGICAL S-101-97-12 (CABLE) ×2 IMPLANT
PACEMAKER ASSURITY DR-RF (Pacemaker) ×2 IMPLANT
PAD PRO RADIOLUCENT 2001M-C (PAD) ×2 IMPLANT
TRAY PACEMAKER INSERTION (PACKS) ×2 IMPLANT

## 2020-10-11 NOTE — Discharge Instructions (Signed)

## 2020-10-11 NOTE — H&P (Signed)
Cardiology Admission History and Physical:   Patient ID: [name] MRN: [id number]; DOB: 1929-09-03   Admission date: 10/11/2020  PCP:  Imagene Riches, NP   [facility] Providers Cardiologist:  None        Chief Complaint:  Pacemaker battery bepletion  Patient Profile:   [name] is a 87 y.o. female with sick sinus sd. who is being seen 10/11/2020 for pacemaker generator change due to battery depletion.  History of Present Illness:   [name] is a 87 y.o. female with a history of paroxysmal atrial fibrillation on chronic anticoagulation, sinus node dysfunction s/p dual chamber pacemaker (leads 2005, generator change 2014 St. Jude), valvular heart disease with a biological aortic valve prosthesis (21 mm, 2005, Dr. Servando Snare), severe hypercholesterolemia. She has not had clinical coronary disease, but preoperative cardiac catheterization in 2005 showed 40% LAD stenosis.   Her pacemaker generator reached ERI on 09/02/2020.   Past Medical History:  Diagnosis Date   Anemia    Anxiety    CAD (coronary artery disease)    CHF (congestive heart failure) (HCC)    EF 55% on 01/05/11, trace AR w/ Bioprosthetic valve   DM2 (diabetes mellitus, type 2) (HCC)    GERD (gastroesophageal reflux disease)    HLD (hyperlipidemia)    HTN (hypertension)    PAF (paroxysmal atrial fibrillation) (HCC)    Restless legs syndrome (RLS)    Tobacco abuse     Past Surgical History:  Procedure Laterality Date   AORTIC VALVE REPLACEMENT  08/25/2002   tissue valve   CARDIOVERSION N/A 05/05/2014   Procedure: CARDIOVERSION;  Surgeon: Dorothy Spark, MD;  Location: [place];  Service: Cardiovascular;  Laterality: N/A;   PACEMAKER INSERTION  04/02/2012   [employer].Jude   PERMANENT PACEMAKER GENERATOR CHANGE N/A 04/02/2012   Procedure: PERMANENT PACEMAKER GENERATOR CHANGE;  Surgeon: Sanda Klein, MD;  Location: [place] [facility];  Service: Cardiovascular;  Laterality: N/A;     Medications Prior  to Admission: Prior to Admission medications   Medication Sig Start Date End Date Taking? Authorizing Provider  ALPRAZolam Duanne Moron) 1 MG tablet Take 0.5 tablets (0.5 mg total) by mouth 2 (two) times daily as needed for anxiety. Patient taking differently: Take 1 mg by mouth 2 (two) times daily. 01/27/16  Yes Bacigalupo, Dionne Bucy, MD  apixaban (ELIQUIS) 2.5 MG TABS tablet TAKE 1 TABLET (2.5 MG TOTAL) BY MOUTH 2 (TWO) TIMES DAILY. 02/07/17  Yes Verner Mould, MD  benazepril (LOTENSIN) 20 MG tablet TAKE 1 TABLET BY MOUTH TWICE A DAY 04/28/20  Yes Seraphim Trow, MD  diltiazem (CARDIZEM CD) 240 MG 24 hr capsule TAKE 1 CAPSULE (240 MG TOTAL) BY MOUTH DAILY. NEED OV. 05/24/20  Yes Dquan Cortopassi, MD  Evolocumab (REPATHA SURECLICK) 768 MG/ML SOAJ INJECT 140 MG INTO THE SKIN EVERY 14 (FOURTEEN) DAYS. 09/24/20  Yes Tiamarie Furnari, MD  ferrous sulfate 325 (65 FE) MG tablet Take 1 tablet (325 mg total) by mouth 2 (two) times daily with a meal. Patient taking differently: Take 325 mg by mouth daily with breakfast. 09/03/15  Yes Bacigalupo, Dionne Bucy, MD  fluticasone (FLONASE) 50 MCG/ACT nasal spray Place 2 sprays into both nostrils daily. Patient taking differently: Place 1 spray into both nostrils daily as needed for allergies. 11/13/16  Yes Verner Mould, MD  furosemide (LASIX) 20 MG tablet Take 20 mg by mouth daily.   Yes [provider]  linaclotide (LINZESS) 145 MCG CAPS capsule Take 145 mcg by mouth daily  as needed (constipation).   Yes [provider]  metFORMIN (GLUCOPHAGE) 500 MG tablet TAKE 1 TABLET (500 MG TOTAL) BY MOUTH 2 (TWO) TIMES DAILY WITH A MEAL. 06/25/17  Yes Verner Mould, MD  metoprolol tartrate (LOPRESSOR) 100 MG tablet Take 1 tablet (100 mg total) by mouth 2 (two) times daily. 11/14/16  Yes Verner Mould, MD  omeprazole (PRILOSEC) 20 MG capsule TAKE 1 CAPSULE BY MOUTH EVERY DAY 07/16/17  Yes Verner Mould, MD  Blood  Glucose Monitoring Suppl (ONE TOUCH ULTRA 2) W/DEVICE KIT  12/03/13   [provider]  ezetimibe (ZETIA) 10 MG tablet TAKE 1 TABLET (10 MG TOTAL) BY MOUTH DAILY. Patient not taking: No sig reported 11/05/18   Gillian Meeuwsen, Dani Gobble, MD  ONE TOUCH ULTRA TEST test strip TEST BLOOD SUGAR TWICE A DAY 12/10/14   Mariel Aloe, MD     Allergies:    Allergies  Allergen Reactions   Escitalopram     Insomnia / paranoid    Pravachol [Pravastatin Sodium] Other (See Comments)    myalgias   Tape Other (See Comments)    Patient states that she has skin cancer and that it is thin (PLEASE USE COBAN WRAP INSTEAD OF TAPE!)    Social History:   Social History   Socioeconomic History   Marital status: Widowed    Spouse name: Not on file   Number of children: 5   Years of education: 5   Highest education level: Not on file  Occupational History   Occupation: Retired-Farmer    Employer: RETIRED  Tobacco Use   Smoking status: Never   Smokeless tobacco: Current    Types: Snuff  Substance and Sexual Activity   Alcohol use: No    Alcohol/week: 0.0 standard drinks   Drug use: No   Sexual activity: Not on file  Other Topics Concern   Not on file  Social History Narrative   Health Care POA:    Emergency Contact: daughter, [name] [phone]   End of Life Plan: gave pt ad pamphlet   Who lives with you: daughter and 2 grandchildren   Any pets: 2 dogs   Diet: pt has a varied diet of protein, starch and vegetables.   Exercise: pt garden's daily   Seatbelts: pt does not wear seatbelt because of pace maker   Sun Exposure/Protection: pt does not use sun protection, has irregular dermatology appointments.   Hobbies: gardening, sitting on porch.         Social Determinants of Health   Financial Resource Strain: Not on file  Food Insecurity: Not on file  Transportation Needs: Not on file  Physical Activity: Not on file  Stress: Not on file  Social Connections: Not on file  Intimate Partner  Violence: Not on file    Family History:   The patient's family history includes Heart attack in her brother; Kidney disease in her sister.    ROS:  Please see the history of present illness.  All other ROS reviewed and negative.     Physical Exam/Data:   Vitals:   10/11/20 1135  BP: (!) 168/77  Pulse: (!) 32  Temp: 98.1 F (36.7 C)  TempSrc: Oral  SpO2: 98%  Weight: 67.1 kg  Height: 5' 4.5" (1.638 m)   No intake or output data in the 24 hours ending 10/11/20 1234 Last 3 Weights 10/11/2020 05/18/2020 12/23/2018  Weight (lbs) 148 lb 146 lb 154 lb  Weight (kg) 67.132 kg 66.225 kg 69.854 kg  Body mass index is 25.01 kg/m.  General:  Well nourished, well developed, in no acute distress. Healthy subclavian PM site. HEENT: normal Lymph: no adenopathy Neck: no JVD Endocrine:  No thryomegaly Vascular: No carotid bruits; FA pulses 2+ bilaterally without bruits  Cardiac:  normal S1, S2; RRR; 3/6 aortic ejection murmur Lungs:  clear to auscultation bilaterally, no wheezing, rhonchi or rales  Abd: soft, nontender, no hepatomegaly  Ext: no edema Musculoskeletal:  No deformities, BUE and BLE strength normal and equal Skin: warm and dry  Neuro:  CNs 2-12 intact, no focal abnormalities noted Psych:  Normal affect    EKG:  The intracardiac electrogram shows atrial paced, ventricular sensed rhythm  Relevant CV Studies: ECHO 08/27/2020  1. Left ventricular ejection fraction, by estimation, is 55 to 60%. The  left ventricle has normal function. The left ventricle has no regional  wall motion abnormalities. There is mild concentric left ventricular  hypertrophy. Left ventricular diastolic  function could not be evaluated.   2. Right ventricular systolic function is normal. The right ventricular  size is normal. There is normal pulmonary artery systolic pressure.   3. Left atrial size was mild to moderately dilated.   4. The mitral valve is grossly normal. Mild mitral valve  regurgitation.   5. The aortic valve has been repaired/replaced. Aortic valve  regurgitation is trivial. There is a bioprosthetic valve present in the  aortic position. Aortic valve mean gradient measures 13.5 mmHg. Aortic  valve Vmax measures 2.47 m/s.   Comparison(s): Prior images unable to be directly viewed, comparison made  by report only. No significant change from prior study.  Laboratory Data:  High Sensitivity Troponin:  No results for input(s): TROPONINIHS in the last 720 hours.    Chemistry Recent Labs  Lab 10/07/20 1200  NA 143  K 5.5*  CL 104  CO2 23  GLUCOSE 90  BUN 24  CREATININE 1.58*  CALCIUM 9.4    No results for input(s): PROT, ALBUMIN, AST, ALT, ALKPHOS, BILITOT in the last 168 hours. Hematology Recent Labs  Lab 10/07/20 1200  WBC 10.5  RBC 4.36  HGB 11.2  HCT 35.4  MCV 81  MCH 25.7*  MCHC 31.6  RDW 14.5  PLT 257   BNPNo results for input(s): BNP, PROBNP in the last 168 hours.  DDimer No results for input(s): DDIMER in the last 168 hours.   Radiology/Studies:  No results found.   Assessment and Plan:   Atrial flutter/atrial fibrillation: Recently low prevalence.  At one point she was in sustained atrial fibrillation for well over 3 months and then had less than 1% atrial fibrillation for a whole year, but on the average spends about 5-10% of the time in atrial fibrillation.  The atrial fib is consistently asymptomatic, but does lead to increased ventricular pacing.  She never has RVR.  She is on anticoagulants CHADSVasc score 6 (age 70, gender, HBP, vascular disease, DM).  Antiarrhythmic therapy has never seemed appropriate in the absence of symptoms. SSS: with the current sensor settings she has appropriate heart rate histogram distribution. Pacemaker at [facility].  generator change out today. Not device dependent. This procedure has been fully reviewed with the patient and written informed consent has been obtained. CAD: Asymptomatic.  Minor  coronary artery disease by cardiac catheterization in 2005.  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  Started Repatha and LDL decreased from 180 to 134 intolerant to statins and ezetimibe.  Will refer  to lipid clinic for adjustment of treatment regimen.  Consider Nextletol. DM: Excellent control.  Hemoglobin A1c was 5.5% on 01/08/2020 HTN: Well-controlled.  No evidence of orthostatic hypotension today. AVR: Her murmur has always been quite loud but with stable transprosthetic gradients. ECHO last month with stable prosthetic gradient. Chronic anticoagulation: Eliquis dose adjusted for age and small body habitus.  Hemoglobin 11.2. CKD: Baseline GFR approximately 30-35 mL/min.   Risk Assessment/Risk Scores:       CHA2DS2-VASc Score = 6  This indicates a 9.7% annual risk of stroke. The patient's score is based upon: CHF History: No HTN History: Yes Diabetes History: Yes Stroke History: No Vascular Disease History: Yes Age Score: 2 Gender Score: 1    For questions or updates, please contact [place] Please consult [email or web address] for contact info under     Signed, Sanda Klein, MD  10/11/2020 12:34 PM

## 2020-10-11 NOTE — Op Note (Signed)
Procedure report  Procedure performed:  1. Dual chamber pacemaker generator changeout  2. Light sedation  Reason for procedure:  1. Device generator at elective replacement interval  2. SSS/tachy-brady sd. Procedure performed by:  Sanda Klein, MD  Complications:  None  Estimated blood loss:  <5 mL  Medications administered during procedure:  Ancef 2 g intravenously, lidocaine 1% 30 mL locally Device details:   Web designer. Jude Assurity model number [id number], serial number [id number] Right atrial lead (chronic) Medtronic [id number], serial number [id number] (implanted 07/23/2003) Right ventricular lead (chronic)  St. Jude [id number], serial number [id number] (implanted 07/23/2003)   Explanted generator St. Jude Accent,  model number 7340, serial number  [id number] (implanted 04/02/2012)  Procedure details:  After the risks and benefits of the procedure were discussed the patient provided informed consent. She was brought to the cardiac catheter lab in the fasting state. The patient was prepped and draped in usual sterile fashion. Local anesthesia with 1% lidocaine was administered to to the left infraclavicular area. A 5-6cm horizontal incision was made parallel with and 2-3 cm caudal to the right clavicle, caudal to an old scar (due to device migration). Using minimal electrocautery and mostly sharp and blunt dissection the prepectoral pocket was opened carefully to avoid injury to the loops of chronic leads. Extensive dissection was not necessary. The device was explanted. The pocket was carefully inspected for hemostasis and flushed with copious amounts of antibiotic solution.  The leads were disconnected from the old generator and the new generator was connected to the chronic leads, with appropriate pacing noted. Telemetry testing of the lead parameters showed excellent values. The entire system was then carefully inserted in the pocket with care been taking that the leads and device assumed  a comfortable position without pressure on the incision.   Great care was taken that the leads be located deep to the generator. The pocket was then closed in layers using 2 layers of 2-0 Vicryl, one layer of 3-0 Vicryl and cutaneous steristrips after which a sterile dressing was applied.   At the end of the procedure the following lead parameters were encountered:   Right atrial lead sensed P waves 0.9 mV, impedance 410 ohms, threshold 1.75 at 0.4 ms pulse width.  Right ventricular lead sensed R waves  8.6 mV, impedance 390 ohms, threshold 1.125 at 1.0 ms pulse width.  Sanda Klein, MD, [facility] [phone] office [phone] pager

## 2020-10-12 LAB — CUP PACEART REMOTE DEVICE CHECK
Battery Remaining Longevity: 0 mo
Battery Voltage: 2.57 V
Brady Statistic AP VP Percent: 29 %
Brady Statistic AP VS Percent: 60 %
Brady Statistic AS VP Percent: 1 %
Brady Statistic AS VS Percent: 10 %
Brady Statistic RA Percent Paced: 66 %
Brady Statistic RV Percent Paced: 40 %
Date Time Interrogation Session: 20220815034630
Implantable Lead Implant Date: 20050526
Implantable Lead Implant Date: 20050526
Implantable Lead Location: 753859
Implantable Lead Location: 753860
Implantable Lead Model: 5076
Implantable Pulse Generator Implant Date: 20140204
Lead Channel Impedance Value: 300 Ohm
Lead Channel Impedance Value: 390 Ohm
Lead Channel Pacing Threshold Amplitude: 1.25 V
Lead Channel Pacing Threshold Amplitude: 1.625 V
Lead Channel Pacing Threshold Pulse Width: 0.5 ms
Lead Channel Pacing Threshold Pulse Width: 1 ms
Lead Channel Sensing Intrinsic Amplitude: 0.8 mV
Lead Channel Sensing Intrinsic Amplitude: 7.9 mV
Lead Channel Setting Pacing Amplitude: 2.5 V
Lead Channel Setting Pacing Amplitude: 3.125
Lead Channel Setting Pacing Pulse Width: 1 ms
Lead Channel Setting Sensing Sensitivity: 1 mV
Pulse Gen Model: 2210
Pulse Gen Serial Number: 7445049

## 2020-10-21 ENCOUNTER — Encounter: Payer: Medicare Other | Admitting: Student

## 2020-10-22 ENCOUNTER — Other Ambulatory Visit: Payer: Self-pay

## 2020-10-22 ENCOUNTER — Ambulatory Visit (INDEPENDENT_AMBULATORY_CARE_PROVIDER_SITE_OTHER): Payer: Medicare Other | Admitting: Student

## 2020-10-22 DIAGNOSIS — I495 Sick sinus syndrome: Secondary | ICD-10-CM | POA: Diagnosis not present

## 2020-10-22 LAB — CUP PACEART INCLINIC DEVICE CHECK
Date Time Interrogation Session: 20220826110934
Implantable Lead Implant Date: 20050526
Implantable Lead Implant Date: 20050526
Implantable Lead Location: 753859
Implantable Lead Location: 753860
Implantable Lead Model: 5076
Implantable Pulse Generator Implant Date: 20220815
Pulse Gen Model: 2272
Pulse Gen Serial Number: 7445049

## 2020-10-22 NOTE — Patient Instructions (Signed)
Medication Instructions:  Your physician recommends that you continue on your current medications as directed. Please refer to the Current Medication list given to you today.  *If you need a refill on your cardiac medications before your next appointment, please call your pharmacy*   Lab Work: None If you have labs (blood work) drawn today and your tests are completely normal, you will receive your results only by: [facility] (if you have MyChart) OR A paper copy in the mail If you have any lab test that is abnormal or we need to change your treatment, we will call you to review the results.   Follow-Up: At [facility], you and your health needs are our priority.  As part of our continuing mission to provide you with exceptional heart care, we have created designated Provider Care Teams.  These Care Teams include your primary Cardiologist (physician) and Advanced Practice Providers (APPs -  Physician Assistants and Nurse Practitioners) who all work together to provide you with the care you need, when you need it.  We recommend signing up for the patient portal called "MyChart".  Sign up information is provided on this After Visit Summary.  MyChart is used to connect with patients for Virtual Visits (Telemedicine).  Patients are able to view lab/test results, encounter notes, upcoming appointments, etc.  Non-urgent messages can be sent to your provider as well.   To learn more about what you can do with MyChart, go to [email or web address].    Your next appointment:   As scheduled

## 2020-10-22 NOTE — Progress Notes (Signed)
Wound check appointment. Steri-strips removed. Wound without redness or edema. Incision edges approximated. Small amount of moisture lateral corner. No bleeding. No s/s infection. Normal device function. Sensing and impedances consistent with implant measurements. RV programmed at appropriate margin. RA would not pass Cap-Confirm today, though Thresholds are stable. Set amplitude to 2.5V. Histogram distribution appropriate for patient and level of activity. Known PAF on ELiquis. Patient educated about wound care, arm mobility, lifting restrictions. ROV in 3 months with Dr. Sallyanne Kuster

## 2020-12-23 DIAGNOSIS — D513 Other dietary vitamin B12 deficiency anemia: Secondary | ICD-10-CM | POA: Diagnosis not present

## 2020-12-23 DIAGNOSIS — Z5181 Encounter for therapeutic drug level monitoring: Secondary | ICD-10-CM | POA: Diagnosis not present

## 2020-12-23 DIAGNOSIS — N390 Urinary tract infection, site not specified: Secondary | ICD-10-CM | POA: Diagnosis not present

## 2020-12-23 DIAGNOSIS — Z79899 Other long term (current) drug therapy: Secondary | ICD-10-CM | POA: Diagnosis not present

## 2020-12-23 DIAGNOSIS — Z23 Encounter for immunization: Secondary | ICD-10-CM | POA: Diagnosis not present

## 2021-01-12 ENCOUNTER — Ambulatory Visit (INDEPENDENT_AMBULATORY_CARE_PROVIDER_SITE_OTHER): Payer: Medicare Other

## 2021-01-12 DIAGNOSIS — I495 Sick sinus syndrome: Secondary | ICD-10-CM | POA: Diagnosis not present

## 2021-01-14 LAB — CUP PACEART REMOTE DEVICE CHECK
Battery Remaining Longevity: 89 mo
Battery Remaining Percentage: 95.5 %
Battery Voltage: 2.99 V
Brady Statistic AP VP Percent: 44 %
Brady Statistic AP VS Percent: 46 %
Brady Statistic AS VP Percent: 1.5 %
Brady Statistic AS VS Percent: 8.2 %
Brady Statistic RA Percent Paced: 84 %
Brady Statistic RV Percent Paced: 47 %
Date Time Interrogation Session: 20221117143730
Implantable Lead Implant Date: 20050526
Implantable Lead Implant Date: 20050526
Implantable Lead Location: 753859
Implantable Lead Location: 753860
Implantable Lead Model: 5076
Implantable Pulse Generator Implant Date: 20220815
Lead Channel Impedance Value: 350 Ohm
Lead Channel Impedance Value: 390 Ohm
Lead Channel Pacing Threshold Amplitude: 1 V
Lead Channel Pacing Threshold Amplitude: 1.625 V
Lead Channel Pacing Threshold Pulse Width: 0.4 ms
Lead Channel Pacing Threshold Pulse Width: 1 ms
Lead Channel Sensing Intrinsic Amplitude: 0.6 mV
Lead Channel Sensing Intrinsic Amplitude: 6.7 mV
Lead Channel Setting Pacing Amplitude: 1.25 V
Lead Channel Setting Pacing Amplitude: 2.5 V
Lead Channel Setting Pacing Pulse Width: 1 ms
Lead Channel Setting Sensing Sensitivity: 2 mV
Pulse Gen Model: 2272
Pulse Gen Serial Number: 7445049

## 2021-01-17 ENCOUNTER — Other Ambulatory Visit: Payer: Self-pay

## 2021-01-17 ENCOUNTER — Ambulatory Visit (INDEPENDENT_AMBULATORY_CARE_PROVIDER_SITE_OTHER): Payer: Medicare Other | Admitting: Cardiovascular Disease

## 2021-01-17 ENCOUNTER — Encounter: Payer: Self-pay | Admitting: Cardiovascular Disease

## 2021-01-17 VITALS — BP 136/68 | HR 77 | Ht 65.0 in | Wt 148.2 lb

## 2021-01-17 DIAGNOSIS — E1122 Type 2 diabetes mellitus with diabetic chronic kidney disease: Secondary | ICD-10-CM | POA: Diagnosis not present

## 2021-01-17 DIAGNOSIS — I2510 Atherosclerotic heart disease of native coronary artery without angina pectoris: Secondary | ICD-10-CM

## 2021-01-17 DIAGNOSIS — I10 Essential (primary) hypertension: Secondary | ICD-10-CM

## 2021-01-17 DIAGNOSIS — E7801 Familial hypercholesterolemia: Secondary | ICD-10-CM

## 2021-01-17 DIAGNOSIS — Z7901 Long term (current) use of anticoagulants: Secondary | ICD-10-CM

## 2021-01-17 DIAGNOSIS — N183 Chronic kidney disease, stage 3 unspecified: Secondary | ICD-10-CM | POA: Diagnosis not present

## 2021-01-17 DIAGNOSIS — Z953 Presence of xenogenic heart valve: Secondary | ICD-10-CM

## 2021-01-17 DIAGNOSIS — Z950 Presence of cardiac pacemaker: Secondary | ICD-10-CM | POA: Diagnosis not present

## 2021-01-17 DIAGNOSIS — N1832 Chronic kidney disease, stage 3b: Secondary | ICD-10-CM | POA: Diagnosis not present

## 2021-01-17 DIAGNOSIS — I495 Sick sinus syndrome: Secondary | ICD-10-CM | POA: Diagnosis not present

## 2021-01-17 DIAGNOSIS — I480 Paroxysmal atrial fibrillation: Secondary | ICD-10-CM

## 2021-01-17 NOTE — Progress Notes (Signed)
Patient ID: [name], female   DOB: 19-Jul-1929, 87 y.o.   MRN: [id number]     Cardiology Office Note    Date:  01/23/2021   ID:  [name], DOB Mar 30, 1929, MRN [id number]  PCP:  Imagene Riches, NP  Cardiologist:   Sanda Klein, MD   Chief Complaint  Patient presents with   Atrial Fibrillation   Pacemaker Check    History of Present Illness:  [name] is a 87 y.o. female with a history of paroxysmal atrial fibrillation on chronic anticoagulation, sinus node dysfunction s/p dual chamber pacemaker (leads 2005, generator change 2014 and August 2022, [employer]), valvular heart disease with a biological aortic valve prosthesis (21 mm, 2005, Dr. Servando Snare), severe hypercholesterolemia. She has not had clinical coronary disease, but preoperative cardiac catheterization in 2005 showed 40% LAD stenosis.   She returns in follow-up after undergoing a pacemaker generator change out on August 15.  The site has healed very nicely, but the skin overlying it is quite thin after the third procedure.  The details of the device stick out.  Nevertheless, the site looks healthy without redness, swelling or other abnormalities.  Pacemaker interrogation shows normal function.  She has a Chemical engineer.  She has 83% atrial pacing and 4.6% ventricular pacing.  The burden of atrial fibrillation is 3.5%, actually better than historical trends for her arrhythmia.  Generator longevity is estimated at 6.9 years.  She has a chronic fairly high ventricular capture threshold.  She is not pacemaker dependent.  Tends to pace the ventricle frequently only when she is in atrial fibrillation.    She had an episode of atrial fibrillation lasting about a day in October 18-19 and had a recent episode of very brief atrial fibrillation for 3 minutes on November 19.  Her atrial fibrillation episodes are always asymptomatic and well rate controlled.  Her most recent echocardiogram  was performed in on August 27, 2020.Marland Kitchen  Left ventricular systolic function remains normal.  The aortic valve prosthetic gradients are just slightly higher than before at mean gradient of 13.5 mmHg the peak aortic velocity is 2.5 m/s, the dimensionless index is 0.31.  Aortic insufficiency C is defined as trivial.  She has not been able to tolerate statins after trying multiple different agents.  She also was intolerant of Zetia labs performed today again show severe hypercholesterolemia.  On treatment with Repatha, she has had some improvement in her LDL cholesterol, but less than one would expect.  She probably has heterozygous familial hypercholesterolemia.   Past Medical History:  Diagnosis Date   Anemia    Anxiety    CAD (coronary artery disease)    CHF (congestive heart failure) (HCC)    EF 55% on 01/05/11, trace AR w/ Bioprosthetic valve   DM2 (diabetes mellitus, type 2) (HCC)    GERD (gastroesophageal reflux disease)    HLD (hyperlipidemia)    HTN (hypertension)    PAF (paroxysmal atrial fibrillation) (HCC)    Restless legs syndrome (RLS)    Tobacco abuse     Past Surgical History:  Procedure Laterality Date   AORTIC VALVE REPLACEMENT  08/25/2002   tissue valve   CARDIOVERSION N/A 05/05/2014   Procedure: CARDIOVERSION;  Surgeon: Dorothy Spark, MD;  Location: [facility];  Service: Cardiovascular;  Laterality: N/A;   PACEMAKER INSERTION  04/02/2012   [employer]   PERMANENT PACEMAKER GENERATOR CHANGE N/A 04/02/2012   Procedure: PERMANENT PACEMAKER GENERATOR CHANGE;  Surgeon: Sanda Klein,  MD;  Location: [facility];  Service: Cardiovascular;  Laterality: N/A;   PPM GENERATOR CHANGEOUT N/A 10/11/2020   Procedure: PPM GENERATOR CHANGEOUT;  Surgeon: Sanda Klein, MD;  Location: [facility];  Service: Cardiovascular;  Laterality: N/A;    Outpatient Medications Prior to Visit  Medication Sig Dispense Refill   ALPRAZolam (XANAX) 1 MG tablet Take 0.5 tablets (0.5 mg total) by mouth  2 (two) times daily as needed for anxiety. 60 tablet 0   apixaban (ELIQUIS) 2.5 MG TABS tablet TAKE 1 TABLET (2.5 MG TOTAL) BY MOUTH 2 (TWO) TIMES DAILY. 60 tablet 11   benazepril (LOTENSIN) 20 MG tablet TAKE 1 TABLET BY MOUTH TWICE A DAY 180 tablet 2   Blood Glucose Monitoring Suppl (ONE TOUCH ULTRA 2) W/DEVICE KIT   0   cephALEXin (KEFLEX) 500 MG capsule Take 500 mg by mouth 2 (two) times daily.     diltiazem (CARDIZEM CD) 240 MG 24 hr capsule TAKE 1 CAPSULE (240 MG TOTAL) BY MOUTH DAILY. NEED OV. 90 capsule 2   Evolocumab (REPATHA SURECLICK) 414 MG/ML SOAJ INJECT 140 MG INTO THE SKIN EVERY 14 (FOURTEEN) DAYS. 2 mL 11   ferrous sulfate 325 (65 FE) MG tablet Take 1 tablet (325 mg total) by mouth daily with breakfast.     fluticasone (FLONASE) 50 MCG/ACT nasal spray Place 1 spray into both nostrils daily as needed for allergies.     furosemide (LASIX) 20 MG tablet Take 20 mg by mouth daily.     linaclotide (LINZESS) 145 MCG CAPS capsule Take 145 mcg by mouth daily as needed (constipation).     metFORMIN (GLUCOPHAGE) 500 MG tablet TAKE 1 TABLET (500 MG TOTAL) BY MOUTH 2 (TWO) TIMES DAILY WITH A MEAL. 180 tablet 3   metoprolol tartrate (LOPRESSOR) 100 MG tablet Take 1 tablet (100 mg total) by mouth 2 (two) times daily. 60 tablet 5   omeprazole (PRILOSEC) 20 MG capsule TAKE 1 CAPSULE BY MOUTH EVERY DAY 90 capsule 0   ONE TOUCH ULTRA TEST test strip TEST BLOOD SUGAR TWICE A DAY 50 each 4   ezetimibe (ZETIA) 10 MG tablet TAKE 1 TABLET (10 MG TOTAL) BY MOUTH DAILY. (Patient not taking: Reported on 10/08/2020) 90 tablet 0   No facility-administered medications prior to visit.     Allergies:   Escitalopram, Pravachol [pravastatin sodium], and Tape   Social History   Socioeconomic History   Marital status: Widowed    Spouse name: Not on file   Number of children: 5   Years of education: 5   Highest education level: Not on file  Occupational History   Occupation: Retired-Farmer    Employer:  RETIRED  Tobacco Use   Smoking status: Never   Smokeless tobacco: Current    Types: Snuff  Substance and Sexual Activity   Alcohol use: No    Alcohol/week: 0.0 standard drinks   Drug use: No   Sexual activity: Not on file  Other Topics Concern   Not on file  Social History Narrative   Health Care POA:    Emergency Contact: daughter, [name] [phone]   End of Life Plan: gave pt ad pamphlet   Who lives with you: daughter and 2 grandchildren   Any pets: 2 dogs   Diet: pt has a varied diet of protein, starch and vegetables.   Exercise: pt garden's daily   Seatbelts: pt does not wear seatbelt because of pace maker   Sun Exposure/Protection: pt does not use sun protection,  has irregular dermatology appointments.   Hobbies: gardening, sitting on porch.         Social Determinants of Health   Financial Resource Strain: Not on file  Food Insecurity: Not on file  Transportation Needs: Not on file  Physical Activity: Not on file  Stress: Not on file  Social Connections: Not on file     Family History:  The patient's family history includes Heart attack in her brother; Kidney disease in her sister.   ROS:   Please see the history of present illness.    ROS All other systems are reviewed and are negative.  PHYSICAL EXAM:   VS:  BP 136/68 (BP Location: Left Arm, Patient Position: Sitting, Cuff Size: Normal)   Pulse 77   Ht _0  (1.651 m)   Wt 148 lb 3.2 oz (67.2 kg)   SpO2 96%   BMI 24.66 kg/m      General: Alert, oriented x3, no distress, healthy right subclavian pacemaker site, with the details of the device easily seen underneath the thin skin.  There is no evidence of redness, swelling or appearance between the device and the skin. Head: no evidence of trauma, PERRL, EOMI, no exophtalmos or lid lag, no myxedema, no xanthelasma; normal ears, nose and oropharynx Neck: normal jugular venous pulsations and no hepatojugular reflux; brisk carotid pulses without delay and no  carotid bruits Chest: clear to auscultation, no signs of consolidation by percussion or palpation, normal fremitus, symmetrical and full respiratory excursions Cardiovascular: normal position and quality of the apical impulse, regular rhythm, normal first and second heart sounds, 3/6 early peaking aortic ejection murmur, no diastolic murmurs, rubs or gallops Abdomen: no tenderness or distention, no masses by palpation, no abnormal pulsatility or arterial bruits, normal bowel sounds, no hepatosplenomegaly Extremities: no clubbing, cyanosis or edema; 2+ radial, ulnar and brachial pulses bilaterally; 2+ right femoral, posterior tibial and dorsalis pedis pulses; 2+ left femoral, posterior tibial and dorsalis pedis pulses; no subclavian or femoral bruits Neurological: grossly nonfocal Psych: Normal mood and affect   Wt Readings from Last 3 Encounters:  01/17/21 148 lb 3.2 oz (67.2 kg)  10/11/20 148 lb (67.1 kg)  05/18/20 146 lb (66.2 kg)      Studies/Labs Reviewed:  EKG: ECG is not ordered today.  The intracardiac electrogram shows atrial paced, ventricular sensed rhythm.  Recent Labs: Labs from 11/29/2018 show normal liver function tests and normal TSH, hemoglobin 11.5, creatinine 1.46  Labs from 01/08/2020 from PCP are significant for potassium 4.1, creatinine 1.40, normal liver function tests, hemoglobin A1c 5.5%, hemoglobin 11.6 Total cholesterol 284, LDL 180, triglycerides 124, HDL 55  BMET    Component Value Date/Time   NA 143 10/07/2020 1200   K 5.5 (H) 10/07/2020 1200   CL 104 10/07/2020 1200   CO2 23 10/07/2020 1200   GLUCOSE 90 10/07/2020 1200   GLUCOSE 139 (H) 12/16/2015 0916   BUN 24 10/07/2020 1200   CREATININE 1.58 (H) 10/07/2020 1200   CREATININE 1.27 (H) 09/02/2015 1124   CALCIUM 9.4 10/07/2020 1200   GFRNONAA 30 (L) 12/16/2015 0916   GFRNONAA 39 (L) 09/02/2015 1124   GFRAA 34 (L) 12/16/2015 0916   GFRAA 45 (L) 09/02/2015 1124   Lipid Panel    Component Value  Date/Time   CHOL 225 (H) 05/18/2020 1537   TRIG 171 (H) 05/18/2020 1537   HDL 61 05/18/2020 1537   CHOLHDL 3.7 05/18/2020 1537   CHOLHDL 6.8 Ratio 04/06/2009 2033   VLDL 41 (H)  04/06/2009 2033   LDLCALC 134 (H) 05/18/2020 1537   LDLDIRECT 141 (H) 06/14/2015 1507   Prior to initiating Repatha on 01/08/2020 total cholesterol 284, LDL 180, HDL 55, triglycerides 224  05/18/2020 Cholesterol 225, HDL 61, LDL 134, triglycerides 171  ASSESSMENT:    1. Paroxysmal atrial fibrillation (HCC)   2. SSS (sick sinus syndrome) ([place])   3. Pacemaker   4. Coronary artery disease involving native coronary artery of native heart without angina pectoris   5. Heterozygous familial hypercholesterolemia   6. Controlled type 2 diabetes mellitus with stage 3 chronic kidney disease, without long-term current use of insulin ([place])   7. Essential hypertension   8. History of aortic valve replacement with bioprosthetic valve   9. Long term (current) use of anticoagulants   10. Stage 3b chronic kidney disease (HCC)      PLAN:  In order of problems listed above:  Atrial flutter/atrial fibrillation: Relatively low prevalence at about 3.5%, although this has been highly variable over the last few years.  Always well rate controlled and asymptomatic, but does lead to increased frequency of ventricular pacing during the arrhythmia.  Appropriate anticoagulated.  CHADSVasc score 6 (age 52, gender, HBP, vascular disease, DM).  Antiarrhythmic therapy has never seemed appropriate in the absence of symptoms. SSS: Good heart rate histogram distribution with the current sensor settings. Pacemaker.  Continue downloads every 3 months.  Her next pacemaker procedure might need to be a subpectoral implant or to move the device contralaterally due to the thinness of the skin. CAD: No angina.  Minor coronary artery disease by cardiac catheterization in 2005.  HLP: Very high LDL cholesterol, labs are highly consistent with familial  heterozygous hypercholesterolemia.  Started Repatha and LDL decreased from 180 to 134 intolerant to statins and ezetimibe.  Only partial response to Repatha. DM: Do have the most recent results, but usually with excellent control.  Hemoglobin A1c was 5.5% on 01/08/2020 HTN: Fair control.  Has a history of orthostatic hypotension.  Should allow occasional systolic blood pressure as high as 140s. AVR: Her murmur has always been quite loud.  This year's echocardiogram shows a slight increase in the mean gradient from 10 to 13.5 mmHg. Chronic anticoagulation: Eliquis dose adjusted for age and small body habitus.  Denies bleeding problems.  Most recent hemoglobin normal range. CKD: GFR has been stable in the 30-35 range.    Medication Adjustments/Labs and Tests Ordered: Current medicines are reviewed at length with the patient today.  Concerns regarding medicines are outlined above.  Medication changes, Labs and Tests ordered today are listed in the Patient Instructions below. Patient Instructions  Medication Instructions:  No changes *If you need a refill on your cardiac medications before your next appointment, please call your pharmacy*   Lab Work: None ordered If you have labs (blood work) drawn today and your tests are completely normal, you will receive your results only by: [place] (if you have MyChart) OR A paper copy in the mail If you have any lab test that is abnormal or we need to change your treatment, we will call you to review the results.   Testing/Procedures: None ordered   Follow-Up: At [facility], you and your health needs are our priority.  As part of our continuing mission to provide you with exceptional heart care, we have created designated Provider Care Teams.  These Care Teams include your primary Cardiologist (physician) and Advanced Practice Providers (APPs -  Physician Assistants and Nurse Practitioners) who all  work together to provide you with the  care you need, when you need it.  We recommend signing up for the patient portal called "MyChart".  Sign up information is provided on this After Visit Summary.  MyChart is used to connect with patients for Virtual Visits (Telemedicine).  Patients are able to view lab/test results, encounter notes, upcoming appointments, etc.  Non-urgent messages can be sent to your provider as well.   To learn more about what you can do with MyChart, go to [email or web address].    Your next appointment:   12 month(s)  The format for your next appointment:   In Person  Provider:   Sanda Klein, MD      Signed, Sanda Klein, MD  01/23/2021 3:48 PM    [facility] [address], [place], [place]  [address] Phone: [phone]; Fax: [phone]

## 2021-01-17 NOTE — Patient Instructions (Addendum)

## 2021-01-19 NOTE — Progress Notes (Signed)
Remote pacemaker transmission.   

## 2021-01-23 ENCOUNTER — Other Ambulatory Visit: Payer: Self-pay | Admitting: Cardiovascular Disease

## 2021-01-23 ENCOUNTER — Encounter: Payer: Self-pay | Admitting: Cardiovascular Disease

## 2021-02-27 ENCOUNTER — Other Ambulatory Visit: Payer: Self-pay | Admitting: Cardiovascular Disease

## 2021-03-28 ENCOUNTER — Telehealth: Payer: Self-pay

## 2021-03-28 NOTE — Telephone Encounter (Signed)
Repeat Abbott alert for exceed AT/AF Persistent AF/flutter since 1/4, controlled ventricular rates Burden 36%, Eliquis, Diltiazem Metoprolol prescribed Last ov 11/21, pt asymptomatic with arrhythmia.  Routing to Dr. Sallyanne Kuster for review.

## 2021-03-29 DIAGNOSIS — E559 Vitamin D deficiency, unspecified: Secondary | ICD-10-CM | POA: Diagnosis not present

## 2021-03-29 DIAGNOSIS — I517 Cardiomegaly: Secondary | ICD-10-CM | POA: Diagnosis not present

## 2021-03-29 DIAGNOSIS — Z79899 Other long term (current) drug therapy: Secondary | ICD-10-CM | POA: Diagnosis not present

## 2021-03-29 DIAGNOSIS — I4891 Unspecified atrial fibrillation: Secondary | ICD-10-CM | POA: Diagnosis not present

## 2021-03-29 DIAGNOSIS — E119 Type 2 diabetes mellitus without complications: Secondary | ICD-10-CM | POA: Diagnosis not present

## 2021-03-29 DIAGNOSIS — K219 Gastro-esophageal reflux disease without esophagitis: Secondary | ICD-10-CM | POA: Diagnosis not present

## 2021-03-29 DIAGNOSIS — R059 Cough, unspecified: Secondary | ICD-10-CM | POA: Diagnosis not present

## 2021-03-29 DIAGNOSIS — Z5181 Encounter for therapeutic drug level monitoring: Secondary | ICD-10-CM | POA: Diagnosis not present

## 2021-03-29 DIAGNOSIS — D513 Other dietary vitamin B12 deficiency anemia: Secondary | ICD-10-CM | POA: Diagnosis not present

## 2021-03-29 DIAGNOSIS — J9811 Atelectasis: Secondary | ICD-10-CM | POA: Diagnosis not present

## 2021-03-29 DIAGNOSIS — R0602 Shortness of breath: Secondary | ICD-10-CM | POA: Diagnosis not present

## 2021-03-29 DIAGNOSIS — Z23 Encounter for immunization: Secondary | ICD-10-CM | POA: Diagnosis not present

## 2021-03-29 DIAGNOSIS — I509 Heart failure, unspecified: Secondary | ICD-10-CM | POA: Diagnosis not present

## 2021-03-29 DIAGNOSIS — J90 Pleural effusion, not elsewhere classified: Secondary | ICD-10-CM | POA: Diagnosis not present

## 2021-04-01 DIAGNOSIS — R0602 Shortness of breath: Secondary | ICD-10-CM | POA: Diagnosis not present

## 2021-04-01 DIAGNOSIS — D513 Other dietary vitamin B12 deficiency anemia: Secondary | ICD-10-CM | POA: Diagnosis not present

## 2021-04-01 DIAGNOSIS — I5031 Acute diastolic (congestive) heart failure: Secondary | ICD-10-CM | POA: Diagnosis not present

## 2021-04-01 DIAGNOSIS — Z79899 Other long term (current) drug therapy: Secondary | ICD-10-CM | POA: Diagnosis not present

## 2021-04-04 DIAGNOSIS — R7309 Other abnormal glucose: Secondary | ICD-10-CM | POA: Diagnosis not present

## 2021-04-04 DIAGNOSIS — I4891 Unspecified atrial fibrillation: Secondary | ICD-10-CM | POA: Diagnosis not present

## 2021-04-04 DIAGNOSIS — N1832 Chronic kidney disease, stage 3b: Secondary | ICD-10-CM | POA: Diagnosis not present

## 2021-04-04 DIAGNOSIS — I5031 Acute diastolic (congestive) heart failure: Secondary | ICD-10-CM | POA: Diagnosis not present

## 2021-04-10 ENCOUNTER — Other Ambulatory Visit: Payer: Self-pay

## 2021-04-10 ENCOUNTER — Inpatient Hospital Stay (HOSPITAL_COMMUNITY)
Admission: EM | Admit: 2021-04-10 | Discharge: 2021-04-15 | DRG: 291 | Disposition: A | Discharge status: Home Health | Payer: Medicare Other | Attending: Internal Medicine | Admitting: Internal Medicine

## 2021-04-10 ENCOUNTER — Encounter (HOSPITAL_COMMUNITY): Payer: Self-pay | Admitting: Emergency Medicine

## 2021-04-10 ENCOUNTER — Emergency Department (HOSPITAL_COMMUNITY): Payer: Medicare Other

## 2021-04-10 DIAGNOSIS — R059 Cough, unspecified: Secondary | ICD-10-CM | POA: Diagnosis not present

## 2021-04-10 DIAGNOSIS — E1122 Type 2 diabetes mellitus with diabetic chronic kidney disease: Secondary | ICD-10-CM | POA: Diagnosis not present

## 2021-04-10 DIAGNOSIS — Z79899 Other long term (current) drug therapy: Secondary | ICD-10-CM | POA: Diagnosis not present

## 2021-04-10 DIAGNOSIS — I495 Sick sinus syndrome: Secondary | ICD-10-CM | POA: Diagnosis present

## 2021-04-10 DIAGNOSIS — Z8249 Family history of ischemic heart disease and other diseases of the circulatory system: Secondary | ICD-10-CM

## 2021-04-10 DIAGNOSIS — Z7901 Long term (current) use of anticoagulants: Secondary | ICD-10-CM

## 2021-04-10 DIAGNOSIS — I130 Hypertensive heart and chronic kidney disease with heart failure and stage 1 through stage 4 chronic kidney disease, or unspecified chronic kidney disease: Secondary | ICD-10-CM | POA: Diagnosis not present

## 2021-04-10 DIAGNOSIS — I10 Essential (primary) hypertension: Secondary | ICD-10-CM | POA: Diagnosis not present

## 2021-04-10 DIAGNOSIS — E876 Hypokalemia: Secondary | ICD-10-CM | POA: Diagnosis not present

## 2021-04-10 DIAGNOSIS — I110 Hypertensive heart disease with heart failure: Secondary | ICD-10-CM | POA: Diagnosis not present

## 2021-04-10 DIAGNOSIS — N183 Chronic kidney disease, stage 3 unspecified: Secondary | ICD-10-CM | POA: Diagnosis not present

## 2021-04-10 DIAGNOSIS — I4819 Other persistent atrial fibrillation: Secondary | ICD-10-CM | POA: Diagnosis not present

## 2021-04-10 DIAGNOSIS — N179 Acute kidney failure, unspecified: Secondary | ICD-10-CM | POA: Diagnosis present

## 2021-04-10 DIAGNOSIS — F17220 Nicotine dependence, chewing tobacco, uncomplicated: Secondary | ICD-10-CM | POA: Diagnosis present

## 2021-04-10 DIAGNOSIS — T82857A Stenosis of cardiac prosthetic devices, implants and grafts, initial encounter: Secondary | ICD-10-CM | POA: Diagnosis present

## 2021-04-10 DIAGNOSIS — I5021 Acute systolic (congestive) heart failure: Secondary | ICD-10-CM | POA: Diagnosis not present

## 2021-04-10 DIAGNOSIS — I4821 Permanent atrial fibrillation: Secondary | ICD-10-CM | POA: Diagnosis present

## 2021-04-10 DIAGNOSIS — R0902 Hypoxemia: Secondary | ICD-10-CM | POA: Diagnosis not present

## 2021-04-10 DIAGNOSIS — I2510 Atherosclerotic heart disease of native coronary artery without angina pectoris: Secondary | ICD-10-CM | POA: Diagnosis not present

## 2021-04-10 DIAGNOSIS — I350 Nonrheumatic aortic (valve) stenosis: Secondary | ICD-10-CM | POA: Diagnosis not present

## 2021-04-10 DIAGNOSIS — I509 Heart failure, unspecified: Secondary | ICD-10-CM | POA: Diagnosis not present

## 2021-04-10 DIAGNOSIS — I5033 Acute on chronic diastolic (congestive) heart failure: Secondary | ICD-10-CM | POA: Diagnosis present

## 2021-04-10 DIAGNOSIS — K219 Gastro-esophageal reflux disease without esophagitis: Secondary | ICD-10-CM | POA: Diagnosis present

## 2021-04-10 DIAGNOSIS — R627 Adult failure to thrive: Secondary | ICD-10-CM | POA: Diagnosis present

## 2021-04-10 DIAGNOSIS — D509 Iron deficiency anemia, unspecified: Secondary | ICD-10-CM | POA: Diagnosis not present

## 2021-04-10 DIAGNOSIS — N189 Chronic kidney disease, unspecified: Secondary | ICD-10-CM | POA: Diagnosis present

## 2021-04-10 DIAGNOSIS — J9601 Acute respiratory failure with hypoxia: Secondary | ICD-10-CM | POA: Diagnosis not present

## 2021-04-10 DIAGNOSIS — Z20822 Contact with and (suspected) exposure to covid-19: Secondary | ICD-10-CM | POA: Diagnosis not present

## 2021-04-10 DIAGNOSIS — E785 Hyperlipidemia, unspecified: Secondary | ICD-10-CM | POA: Diagnosis not present

## 2021-04-10 DIAGNOSIS — I5031 Acute diastolic (congestive) heart failure: Secondary | ICD-10-CM | POA: Diagnosis not present

## 2021-04-10 DIAGNOSIS — F05 Delirium due to known physiological condition: Secondary | ICD-10-CM | POA: Diagnosis present

## 2021-04-10 DIAGNOSIS — N1832 Chronic kidney disease, stage 3b: Secondary | ICD-10-CM | POA: Diagnosis not present

## 2021-04-10 DIAGNOSIS — E1169 Type 2 diabetes mellitus with other specified complication: Secondary | ICD-10-CM | POA: Diagnosis present

## 2021-04-10 DIAGNOSIS — R54 Age-related physical debility: Secondary | ICD-10-CM | POA: Diagnosis present

## 2021-04-10 DIAGNOSIS — I4892 Unspecified atrial flutter: Secondary | ICD-10-CM | POA: Diagnosis present

## 2021-04-10 DIAGNOSIS — Y831 Surgical operation with implant of artificial internal device as the cause of abnormal reaction of the patient, or of later complication, without mention of misadventure at the time of the procedure: Secondary | ICD-10-CM | POA: Diagnosis present

## 2021-04-10 DIAGNOSIS — R0602 Shortness of breath: Secondary | ICD-10-CM | POA: Diagnosis not present

## 2021-04-10 DIAGNOSIS — Z841 Family history of disorders of kidney and ureter: Secondary | ICD-10-CM

## 2021-04-10 DIAGNOSIS — F419 Anxiety disorder, unspecified: Secondary | ICD-10-CM | POA: Diagnosis present

## 2021-04-10 DIAGNOSIS — Z66 Do not resuscitate: Secondary | ICD-10-CM | POA: Diagnosis present

## 2021-04-10 DIAGNOSIS — J90 Pleural effusion, not elsewhere classified: Secondary | ICD-10-CM | POA: Diagnosis not present

## 2021-04-10 DIAGNOSIS — D72829 Elevated white blood cell count, unspecified: Secondary | ICD-10-CM | POA: Diagnosis present

## 2021-04-10 DIAGNOSIS — Z950 Presence of cardiac pacemaker: Secondary | ICD-10-CM | POA: Diagnosis not present

## 2021-04-10 LAB — CBC WITH DIFFERENTIAL/PLATELET
Abs Immature Granulocytes: 0.13 10*3/uL — ABNORMAL HIGH (ref 0.00–0.07)
Basophils Absolute: 0.1 10*3/uL (ref 0.0–0.1)
Basophils Relative: 0 %
Eosinophils Absolute: 0.2 10*3/uL (ref 0.0–0.5)
Eosinophils Relative: 1 %
HCT: 33.8 % — ABNORMAL LOW (ref 36.0–46.0)
Hemoglobin: 10.4 g/dL — ABNORMAL LOW (ref 12.0–15.0)
Immature Granulocytes: 1 %
Lymphocytes Relative: 16 %
Lymphs Abs: 2.5 10*3/uL (ref 0.7–4.0)
MCH: 25.1 pg — ABNORMAL LOW (ref 26.0–34.0)
MCHC: 30.8 g/dL (ref 30.0–36.0)
MCV: 81.4 fL (ref 80.0–100.0)
Monocytes Absolute: 0.9 10*3/uL (ref 0.1–1.0)
Monocytes Relative: 6 %
Neutro Abs: 12.2 10*3/uL — ABNORMAL HIGH (ref 1.7–7.7)
Neutrophils Relative %: 76 %
Platelets: 257 10*3/uL (ref 150–400)
RBC: 4.15 MIL/uL (ref 3.87–5.11)
RDW: 17 % — ABNORMAL HIGH (ref 11.5–15.5)
WBC: 16 10*3/uL — ABNORMAL HIGH (ref 4.0–10.5)
nRBC: 0.2 % (ref 0.0–0.2)

## 2021-04-10 LAB — COMPREHENSIVE METABOLIC PANEL
ALT: 12 U/L (ref 0–44)
AST: 20 U/L (ref 15–41)
Albumin: 3.3 g/dL — ABNORMAL LOW (ref 3.5–5.0)
Alkaline Phosphatase: 57 U/L (ref 38–126)
Anion gap: 14 (ref 5–15)
BUN: 24 mg/dL — ABNORMAL HIGH (ref 8–23)
CO2: 23 mmol/L (ref 22–32)
Calcium: 8.4 mg/dL — ABNORMAL LOW (ref 8.9–10.3)
Chloride: 101 mmol/L (ref 98–111)
Creatinine, Ser: 1.63 mg/dL — ABNORMAL HIGH (ref 0.44–1.00)
GFR, Estimated: 30 mL/min — ABNORMAL LOW (ref >60)
Glucose, Bld: 125 mg/dL — ABNORMAL HIGH (ref 70–99)
Potassium: 3.9 mmol/L (ref 3.5–5.1)
Sodium: 138 mmol/L (ref 135–145)
Total Bilirubin: 0.6 mg/dL (ref 0.3–1.2)
Total Protein: 7.3 g/dL (ref 6.5–8.1)

## 2021-04-10 LAB — URINALYSIS, ROUTINE W REFLEX MICROSCOPIC
Bilirubin Urine: NEGATIVE
Glucose, UA: NEGATIVE mg/dL
Ketones, ur: NEGATIVE mg/dL
Nitrite: NEGATIVE
Protein, ur: 100 mg/dL — AB
Specific Gravity, Urine: 1.005 (ref 1.005–1.030)
pH: 5 (ref 5.0–8.0)

## 2021-04-10 LAB — MAGNESIUM: Magnesium: 1.3 mg/dL — ABNORMAL LOW (ref 1.7–2.4)

## 2021-04-10 LAB — BRAIN NATRIURETIC PEPTIDE: B Natriuretic Peptide: 1007.3 pg/mL — ABNORMAL HIGH (ref 0.0–100.0)

## 2021-04-10 LAB — RESP PANEL BY RT-PCR (FLU A&B, COVID) ARPGX2
Influenza A by PCR: NEGATIVE
Influenza B by PCR: NEGATIVE
SARS Coronavirus 2 by RT PCR: NEGATIVE

## 2021-04-10 MED ORDER — METOPROLOL TARTRATE 25 MG PO TABS
25.0000 mg | ORAL_TABLET | Freq: Once | ORAL | Status: AC
  Administered 2021-04-10: 25 mg via ORAL
  Filled 2021-04-10: qty 1

## 2021-04-10 MED ORDER — SODIUM CHLORIDE 0.9% FLUSH
3.0000 mL | Freq: Two times a day (BID) | INTRAVENOUS | Status: DC
  Administered 2021-04-10 – 2021-04-14 (×5): 3 mL via INTRAVENOUS

## 2021-04-10 MED ORDER — SODIUM CHLORIDE 0.9 % IV SOLN: 250.0000 mL | INTRAVENOUS | Status: DC | PRN

## 2021-04-10 MED ORDER — MAGNESIUM SULFATE 2 GM/50ML IV SOLN
2.0000 g | Freq: Once | INTRAVENOUS | Status: AC
  Administered 2021-04-10: 2 g via INTRAVENOUS
  Filled 2021-04-10: qty 50

## 2021-04-10 MED ORDER — DILTIAZEM HCL-DEXTROSE 125-5 MG/125ML-% IV SOLN (PREMIX)
5.0000 mg/h | INTRAVENOUS | Status: DC
  Administered 2021-04-10: 5 mg/h via INTRAVENOUS
  Filled 2021-04-10: qty 125

## 2021-04-10 MED ORDER — ONDANSETRON HCL 4 MG/2ML IJ SOLN: 4.0000 mg | Freq: Four times a day (QID) | INTRAMUSCULAR | Status: DC | PRN

## 2021-04-10 MED ORDER — ACETAMINOPHEN 325 MG PO TABS: 650.0000 mg | ORAL_TABLET | ORAL | Status: DC | PRN

## 2021-04-10 MED ORDER — POTASSIUM CHLORIDE CRYS ER 20 MEQ PO TBCR
40.0000 meq | EXTENDED_RELEASE_TABLET | Freq: Once | ORAL | Status: AC
  Administered 2021-04-10: 40 meq via ORAL
  Filled 2021-04-10: qty 2

## 2021-04-10 MED ORDER — DILTIAZEM LOAD VIA INFUSION
10.0000 mg | Freq: Once | INTRAVENOUS | Status: AC
  Administered 2021-04-10: 10 mg via INTRAVENOUS
  Filled 2021-04-10: qty 10

## 2021-04-10 MED ORDER — METOPROLOL TARTRATE 5 MG/5ML IV SOLN
5.0000 mg | Freq: Once | INTRAVENOUS | Status: AC
  Administered 2021-04-10: 5 mg via INTRAVENOUS
  Filled 2021-04-10: qty 5

## 2021-04-10 MED ORDER — SODIUM CHLORIDE 0.9% FLUSH: 3.0000 mL | INTRAVENOUS | Status: DC | PRN

## 2021-04-10 MED ORDER — FUROSEMIDE 10 MG/ML IJ SOLN
40.0000 mg | Freq: Once | INTRAMUSCULAR | Status: AC
  Administered 2021-04-10: 40 mg via INTRAVENOUS
  Filled 2021-04-10: qty 4

## 2021-04-10 NOTE — ED Triage Notes (Signed)
Pt BIB [facility] from home with complaints of shortness of breath. This originally started a week ago and patient was seen by PCP and was dx with a lasix daily and abx. Pt with no improvement and complaining of worsening SHOB since last night. VSS however pt found to be 88% SpO2 on RA. Placed on 3 L [place] and improvement to 98%. Denies pain at this time.

## 2021-04-10 NOTE — ED Provider Notes (Signed)
[facility] Emergency Department Provider Note MRN:  [id number]  Arrival date & time: 04/10/21     Chief Complaint   Shortness of Breath   History of Present Illness   [name] is a 88 y.o. year-old female with a history of Anemia, Anxiety, CAD, CHF, HLD, HTN PAF presenting to the ED with chief complaint of SOB.  Arrives via EMS from home with complaints of shortness of breath.  Patient states that her shortness of breath started 1 week ago.  And worsened 2 days ago.  She states that she has had a cough that has been productive with white sputum.  She states that she has had no associated chest pain or chest pressure.  She states that she has had some mild abdominal discomfort but no overt abdominal pain.  She admits to having chills but having no fevers.  She is unaware if that she has had any sick contacts.  Patient states that her shortness of breath worsened last night.  Patient was found to be hypoxic to 88% on SPO2 on room air and was placed on 3 L nasal cannula.  Patient is not on oxygen at baseline.  Review of Systems  A thorough review of systems was obtained and all systems are negative except as noted in the HPI and PMH.   Patient's Health History    Past Medical History:  Diagnosis Date   Anemia    Anxiety    CAD (coronary artery disease)    CHF (congestive heart failure) (HCC)    EF 55% on 01/05/11, trace AR w/ Bioprosthetic valve   DM2 (diabetes mellitus, type 2) (HCC)    GERD (gastroesophageal reflux disease)    HLD (hyperlipidemia)    HTN (hypertension)    PAF (paroxysmal atrial fibrillation) (HCC)    Restless legs syndrome (RLS)    Tobacco abuse     Past Surgical History:  Procedure Laterality Date   AORTIC VALVE REPLACEMENT  08/25/2002   tissue valve   CARDIOVERSION N/A 05/05/2014   Procedure: CARDIOVERSION;  Surgeon: Dorothy Spark, MD;  Location: [place];  Service: Cardiovascular;  Laterality: N/A;    PACEMAKER INSERTION  04/02/2012   [facility]   PERMANENT PACEMAKER GENERATOR CHANGE N/A 04/02/2012   Procedure: PERMANENT PACEMAKER GENERATOR CHANGE;  Surgeon: Sanda Klein, MD;  Location: [facility] CATH [facility];  Service: Cardiovascular;  Laterality: N/A;   PPM GENERATOR CHANGEOUT N/A 10/11/2020   Procedure: PPM GENERATOR CHANGEOUT;  Surgeon: Sanda Klein, MD;  Location: [place] [facility];  Service: Cardiovascular;  Laterality: N/A;    Family History  Problem Relation Age of Onset   Heart attack Brother    Kidney disease Sister     Social History   Socioeconomic History   Marital status: Widowed    Spouse name: Not on file   Number of children: 5   Years of education: 5   Highest education level: Not on file  Occupational History   Occupation: Retired-Farmer    Employer: RETIRED  Tobacco Use   Smoking status: Never   Smokeless tobacco: Current    Types: Snuff  Substance and Sexual Activity   Alcohol use: No    Alcohol/week: 0.0 standard drinks   Drug use: No   Sexual activity: Not on file  Other Topics Concern   Not on file  Social History Narrative   Health Care POA:    Emergency Contact: daughter, [name] [phone]   End of Life Plan: gave pt ad  pamphlet   Who lives with you: daughter and 2 grandchildren   Any pets: 2 dogs   Diet: pt has a varied diet of protein, starch and vegetables.   Exercise: pt garden's daily   Seatbelts: pt does not wear seatbelt because of pace maker   Sun Exposure/Protection: pt does not use sun protection, has irregular dermatology appointments.   Hobbies: gardening, sitting on porch.         Social Determinants of Health   Financial Resource Strain: Not on file  Food Insecurity: Not on file  Transportation Needs: Not on file  Physical Activity: Not on file  Stress: Not on file  Social Connections: Not on file  Intimate Partner Violence: Not on file     Physical Exam   Physical Exam Constitutional:      Appearance: She is  well-developed. She is not ill-appearing.     Comments: Hard of hearing.  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  Also reports that she has bleeding in her left ear from time to time.  Gauze packing removed from her left ear and revealed granulation tissue.  No active bleeding present.   HENT:     Head: Normocephalic and atraumatic.     Right Ear: External ear normal.     Left Ear: External ear normal.     Nose: Nose normal.  Cardiovascular:     Rate and Rhythm: Normal rate and regular rhythm.     Pulses: Normal pulses.     Heart sounds: Normal heart sounds.  Pulmonary:     Effort: Pulmonary effort is normal. No tachypnea, accessory muscle usage or respiratory distress.     Breath sounds: Examination of the right-upper field reveals rhonchi. Examination of the right-middle field reveals rhonchi. Rhonchi present.     Comments: On 3 L nasal cannula Abdominal:     General: Abdomen is flat. There is no distension.     Palpations: Abdomen is soft.     Tenderness: There is no abdominal tenderness.  Musculoskeletal:     Cervical back: Neck supple.     Right lower leg: Edema present.     Left lower leg: Edema present.  Skin:    General: Skin is warm and dry.  Neurological:     General: No focal deficit present.     Mental Status: She is alert and oriented to person, place, and time.  Psychiatric:        Mood and Affect: Mood normal.      Diagnostic and Interventional Summary    EKG Interpretation  Date/Time:  Sunday April 10 2021 16:10:56 EST Ventricular Rate:  94 PR Interval:    QRS Duration: 111 QT Interval:  359 QTC Calculation: 459 R Axis:   -55 Text Interpretation: Atrial fibrillation Incomplete left bundle branch block LVH with secondary repolarization abnormality Inferior infarct, old new inferior TWI Confirmed by Varney Biles [phone]) on 04/10/2021 4:13:55 PM       Labs Reviewed  BRAIN NATRIURETIC PEPTIDE - Abnormal; Notable for the following components:       Result Value   B Natriuretic Peptide 1,007.3 (*)    All other components within normal limits  COMPREHENSIVE METABOLIC PANEL - Abnormal; Notable for the following components:   Glucose, Bld 125 (*)    BUN 24 (*)    Creatinine, Ser 1.63 (*)    Calcium 8.4 (*)    Albumin 3.3 (*)    GFR, Estimated 30 (*)    All other components  within normal limits  CBC WITH DIFFERENTIAL/PLATELET - Abnormal; Notable for the following components:   WBC 16.0 (*)    Hemoglobin 10.4 (*)    HCT 33.8 (*)    MCH 25.1 (*)    RDW 17.0 (*)    Neutro Abs 12.2 (*)    Abs Immature Granulocytes 0.13 (*)    All other components within normal limits  MAGNESIUM - Abnormal; Notable for the following components:   Magnesium 1.3 (*)    All other components within normal limits  RESP PANEL BY RT-PCR (FLU A&B, COVID) [id number]    DG Chest Portable 1 View  Final Result      Medications  magnesium sulfate IVPB 2 g 50 mL (2 g Intravenous New Bag/Given 04/10/21 1734)  metoprolol tartrate (LOPRESSOR) tablet 25 mg (25 mg Oral Given 04/10/21 1700)  furosemide (LASIX) injection 40 mg (40 mg Intravenous Given 04/10/21 1730)     Procedures  /  Critical Care Procedures  ED Course and Medical Decision Making  Initial Impression and Ddx This is a 88 year old female presents to emergency department for evaluation of shortness of breath.  Symptoms have been ongoing for 1 week but acutely worsened 2 days ago.  Her symptoms are associated with cough and sputum production.  She also reports having chills.  Differential diagnosis includes was not limited to the following: Pneumonia, COVID, flu, pulmonary embolism, heart failure exacerbation.  We will obtain labs and imaging to further evaluate.  Past medical/surgical history that increases complexity of ED encounter: CAD, CHF, atrial fibrillation on anticoagulation  Further history obtained from the patient's daughter over the telephone who reports that the patient was previously  taking her Lasix every other day.  After seeing her primary care physician earlier this week they recommended taking daily Lasix.  The patient's daughter also reports that the patient was prescribed an antibiotic.  She states that this was for the patient's heart failure and is unsure if the patient was being treated for infection.  Interpretation of Diagnostics I personally reviewed the EKG, Chest Xray, and Cardiac Monitor and my interpretation is as follows: EKG reviewed and the patient was in atrial fibrillation without acute ST elevations.  Chest x-ray revealed bilateral pleural effusions with interstitial pulmonary edema.  No signs of consolidation to suggest pneumonia.  Patient remains in atrial fibrillation with an irregularly irregular rhythm on cardiac monitor.     Leukocytosis of 07.3 with neutrophilic predominance noted on CBC.  Magnesium 1.3.  This was repleted with 2 mg of IV magnesium.  CMP was unremarkable.  The patient's creatinine is 1.63 patient has a baseline creatinine of 1.50. BNP more than 1000  40 mg of IV Lasix were administered to the patient as well as 25 mg of metoprolol tartrate.  Patient Reassessment and Ultimate Disposition/Management Given new oxygen requirement and imaging consistent with volume overloaded state I feel as if the patient meets requirements for hospital admission.  Internal medicine teaching service was consulted for admission and agreed admit the patient to their service for further management of her heart failure exacerbation.  Patient management required discussion with the following services or consulting groups:  Hospitalist Service  Complexity of Problems Addressed Acute illness or injury that poses threat of life of bodily function  Additional Data Reviewed and Analyzed Further history obtained from: Recent Consult notes  Factors Impacting ED Encounter Risk Consideration of hospitalization    Final Clinical Impressions(s) / ED Diagnoses      ICD-10-CM   1.  Acute on chronic congestive heart failure, unspecified heart failure type ([place])  I50.9            Zachery Dakins, MD 04/10/21 [place], Ankit, MD 04/10/21 [phone]

## 2021-04-10 NOTE — H&P (Signed)
History and Physical    Patient: [name] HWT:[id number] DOB: 1929/12/22 DOA: 04/10/2021 DOS: the patient was seen and examined on 04/10/2021 PCP: Imagene Riches, NP  Patient coming from: Home  Chief Complaint:  Chief Complaint  Patient presents with   Shortness of Breath    HPI: [name] is a 88 y.o. female with medical history significant of sick sinus syndrome s/p pacemaker, permanent atrial fibrillation/atrial flutter on Eliquis, valvular heart disease with biological aortic valve prosthesis, CHF, CAD, hypertension, CKD stage IIIb, hyperlipidemia who presents with concerns of increasing shortness of breath.  Patient is a limited historian and has hearing impairment.  She reports 2 weeks of increasing shortness of breath.  Denies lower extremity edema.  Per ED sign out her Lasix was increased by her primary physician although she does not recall this.  Does not normally wear oxygen at home.  In the ED, she was hypoxic in the 80s requiring up to 3 L nasal cannula.  She was hypertensive up to 160s/113 and was tachycardic reportedly in 2:1 atrial flutter.  Chest x-ray shows bilateral pleural effusion.  BNP greater than 1000.  Creatinine of 1.63 compared to 1.5.  Sodium of 138, K of 3.9, magnesium of 1.3.  She was given oral 25 Lopressor, IV 5 mg Lopressor and later started on diltiazem infusion with bolus for atrial fibrillation with RVR.  She was given IV 40 mg of Lasix and had her magnesium repleted with IV 2 g.  Hospitalist was then called for admission.    Review of Systems: unable to review all systems due to the inability of the patient to answer questions. Past Medical History:  Diagnosis Date   Anemia    Anxiety    CAD (coronary artery disease)    CHF (congestive heart failure) (HCC)    EF 55% on 01/05/11, trace AR w/ Bioprosthetic valve   DM2 (diabetes mellitus, type 2) (HCC)    GERD (gastroesophageal reflux disease)    HLD (hyperlipidemia)    HTN  (hypertension)    PAF (paroxysmal atrial fibrillation) (HCC)    Restless legs syndrome (RLS)    Tobacco abuse    Past Surgical History:  Procedure Laterality Date   AORTIC VALVE REPLACEMENT  08/25/2002   tissue valve   CARDIOVERSION N/A 05/05/2014   Procedure: CARDIOVERSION;  Surgeon: Dorothy Spark, MD;  Location: [facility];  Service: Cardiovascular;  Laterality: N/A;   PACEMAKER INSERTION  04/02/2012   [facility]   PERMANENT PACEMAKER GENERATOR CHANGE N/A 04/02/2012   Procedure: PERMANENT PACEMAKER GENERATOR CHANGE;  Surgeon: Sanda Klein, MD;  Location: [facility] CATH [facility];  Service: Cardiovascular;  Laterality: N/A;   PPM GENERATOR CHANGEOUT N/A 10/11/2020   Procedure: PPM GENERATOR CHANGEOUT;  Surgeon: Sanda Klein, MD;  Location: [facility];  Service: Cardiovascular;  Laterality: N/A;   Social History:  reports that she has never smoked. Her smokeless tobacco use includes snuff. She reports that she does not drink alcohol and does not use drugs. Pt lives with daughter. Chews tobacco daily.   Allergies  Allergen Reactions   Escitalopram     Insomnia / paranoid    Pravachol [Pravastatin Sodium] Other (See Comments)    myalgias   Tape Other (See Comments)    Patient states that she has skin cancer and that it is thin (PLEASE USE COBAN WRAP INSTEAD OF TAPE!)    Family History  Problem Relation Age of Onset   Heart attack Brother    Kidney  disease Sister     Prior to Admission medications   Medication Sig Start Date End Date Taking? Authorizing Provider  ALPRAZolam Duanne Moron) 1 MG tablet Take 0.5 tablets (0.5 mg total) by mouth 2 (two) times daily as needed for anxiety. Patient taking differently: Take 1 mg by mouth at bedtime as needed for anxiety. 01/27/16  Yes Bacigalupo, Dionne Bucy, MD  apixaban (ELIQUIS) 2.5 MG TABS tablet TAKE 1 TABLET (2.5 MG TOTAL) BY MOUTH 2 (TWO) TIMES DAILY. Patient taking differently: Take 2.5 mg by mouth 2 (two) times daily. 10/12/20  Yes Croitoru,  Mihai, MD  benazepril (LOTENSIN) 20 MG tablet Take 1 tablet (20 mg total) by mouth 2 (two) times daily. 01/25/21  Yes Croitoru, Mihai, MD  Cyanocobalamin (VITAMIN DEFICIENCY SYSTEM-B12 IJ) Inject 1 Dose as directed every 30 (thirty) days. Not sure about dosage   Yes [provider]  diltiazem (CARDIZEM CD) 240 MG 24 hr capsule TAKE 1 CAPSULE (240 MG TOTAL) BY MOUTH DAILY. NEED OFFICE VISIT Patient taking differently: 240 mg daily. 03/01/21  Yes Croitoru, Mihai, MD  doxycycline (VIBRAMYCIN) 100 MG capsule Take 100 mg by mouth at bedtime. 04/02/21  Yes [provider]  Evolocumab (REPATHA SURECLICK) 220 MG/ML SOAJ INJECT 140 MG INTO THE SKIN EVERY 14 (FOURTEEN) DAYS. 09/24/20  Yes Croitoru, Mihai, MD  ferrous sulfate 325 (65 FE) MG tablet Take 1 tablet (325 mg total) by mouth daily with breakfast. 10/11/20  Yes Croitoru, Mihai, MD  fluticasone (FLONASE) 50 MCG/ACT nasal spray Place 1 spray into both nostrils daily as needed for allergies. 10/11/20  Yes Croitoru, Mihai, MD  furosemide (LASIX) 20 MG tablet Take 20 mg by mouth daily.   Yes [provider]  linaclotide (LINZESS) 145 MCG CAPS capsule Take 145 mcg by mouth daily as needed (constipation).   Yes [provider]  metFORMIN (GLUCOPHAGE) 500 MG tablet TAKE 1 TABLET (500 MG TOTAL) BY MOUTH 2 (TWO) TIMES DAILY WITH A MEAL. 06/25/17  Yes Verner Mould, MD  metoprolol tartrate (LOPRESSOR) 100 MG tablet Take 1 tablet (100 mg total) by mouth 2 (two) times daily. 11/14/16  Yes Verner Mould, MD  omeprazole (PRILOSEC) 20 MG capsule TAKE 1 CAPSULE BY MOUTH EVERY DAY Patient taking differently: 20 mg daily. 07/16/17  Yes Verner Mould, MD  Blood Glucose Monitoring Suppl (ONE TOUCH ULTRA 2) W/DEVICE KIT  12/03/13   [provider]  ezetimibe (ZETIA) 10 MG tablet TAKE 1 TABLET (10 MG TOTAL) BY MOUTH DAILY. Patient not taking: Reported on 10/08/2020 11/05/18   Croitoru, Dani Gobble, MD  ONE TOUCH  ULTRA TEST test strip TEST BLOOD SUGAR TWICE A DAY 12/10/14   Mariel Aloe, MD    Physical Exam: Vitals:   04/10/21 1915 04/10/21 1945 04/10/21 1957 04/10/21 2024  BP: (!) 181/118 (!) 173/123    Pulse: (!) 139 (!) 139 (!) 140 (!) 139  Resp: (!) 27 (!) 32 (!) 24 (!) 30  Temp:      TempSrc:      SpO2: 97% 95% 96% 96%  Weight:      Height:       Constitutional: NAD, calm, comfortable, chronically ill-appearing elderly female laying at approximately 20 degree incline in bed in Eyes: Erythema around bilateral lower lid  ENMT: Mucous membranes are moist.  Neck: normal, supple Respiratory: clear to auscultation bilaterally, no wheezing, no crackles. Normal respiratory effort. No accessory muscle use.  Cardiovascular: Regular rate and rhythm, no murmurs / rubs / gallops. No extremity  edema.   Abdomen: no tenderness,  Bowel sounds positive.  Musculoskeletal: no clubbing / cyanosis. No joint deformity upper and lower extremities.  Skin: no rashes, lesions, ulcers.  Neurologic: CN 2-12 grossly intact.  Strength 5/5 in all 4.  Psychiatric: Normal judgment and insight. Alert and oriented x 3. Normal mood.  Data Reviewed:   Telemetry atrial fibrillation with RVR and frequent PVCs   Assessment and Plan:   Acute on chronic CHF exacerbation -CXR with bilateral effusion and BNP >1000 -continue daily IV 38m Lasix  -Obtain intake and output, daily weights -repeat echocardiogram -Last echocardiogram on 08/2020 with EF of 55 to 662%with diastolic function not able to be evaluated.  No significant valvular abnormality with bioprosthetic aortic valve  Acute hypoxic respiratory failure secondary to CHF exacerbation -admit on 3L [place] -Continue IV diuresis as above and wean as tolerated  Atrial fibrillation with RVR Goal of 4 for K and 2 for Mg-repleted  Continue diltiazem infusion  Hold home oral diltiazem Continue metoprolol tartrate  Continue Eliquis   AKI on CKD stage IIIb -  Creatinine of 1.63 from prior of 1.5  Hyperlipidemia familial heterozygous hypercholesterolemia On Repatha  Type 2 diabetes Hold home meds Start sensitive SSI   Hypertension Elevated On IV Lasix  Hold ACE due to AKI  Sick sinus syndrome s/p pacemaker stable  Advance Care Planning:   Code Status: Prior DNR  Consults:None  Family Communication: No family at bedside  Severity of Illness: The appropriate patient status for this patient is INPATIENT. Inpatient status is judged to be reasonable and necessary in order to provide the required intensity of service to ensure the patient's safety. The patient's presenting symptoms, physical exam findings, and initial radiographic and laboratory data in the context of their chronic comorbidities is felt to place them at high risk for further clinical deterioration. Furthermore, it is not anticipated that the patient will be medically stable for discharge from the hospital within 2 midnights of admission.   * I certify that at the point of admission it is my clinical judgment that the patient will require inpatient hospital care spanning beyond 2 midnights from the point of admission due to high intensity of service, high risk for further deterioration and high frequency of surveillance required.*  Author: COrene Desanctis DO 04/10/2021 9:15 PM  For on call review [email or web address]

## 2021-04-10 NOTE — ED Notes (Signed)
Pt SpO2 88% on room air. Pt placed on 2L via [place]. SpO2 95% and above on 2L.

## 2021-04-10 NOTE — ED Notes (Signed)
RN paged admitting provider per pt HR 140s

## 2021-04-11 ENCOUNTER — Encounter (HOSPITAL_COMMUNITY): Payer: Self-pay | Admitting: Family Medicine

## 2021-04-11 ENCOUNTER — Inpatient Hospital Stay (HOSPITAL_COMMUNITY): Payer: Medicare Other

## 2021-04-11 DIAGNOSIS — I5021 Acute systolic (congestive) heart failure: Secondary | ICD-10-CM

## 2021-04-11 DIAGNOSIS — N179 Acute kidney failure, unspecified: Secondary | ICD-10-CM | POA: Diagnosis not present

## 2021-04-11 DIAGNOSIS — D509 Iron deficiency anemia, unspecified: Secondary | ICD-10-CM | POA: Diagnosis present

## 2021-04-11 DIAGNOSIS — J9601 Acute respiratory failure with hypoxia: Secondary | ICD-10-CM | POA: Insufficient documentation

## 2021-04-11 DIAGNOSIS — N189 Chronic kidney disease, unspecified: Secondary | ICD-10-CM | POA: Diagnosis present

## 2021-04-11 DIAGNOSIS — I509 Heart failure, unspecified: Secondary | ICD-10-CM | POA: Diagnosis not present

## 2021-04-11 DIAGNOSIS — I4819 Other persistent atrial fibrillation: Secondary | ICD-10-CM

## 2021-04-11 DIAGNOSIS — I10 Essential (primary) hypertension: Secondary | ICD-10-CM | POA: Diagnosis not present

## 2021-04-11 LAB — BASIC METABOLIC PANEL
Anion gap: 14 (ref 5–15)
BUN: 24 mg/dL — ABNORMAL HIGH (ref 8–23)
CO2: 23 mmol/L (ref 22–32)
Calcium: 8.2 mg/dL — ABNORMAL LOW (ref 8.9–10.3)
Chloride: 102 mmol/L (ref 98–111)
Creatinine, Ser: 1.81 mg/dL — ABNORMAL HIGH (ref 0.44–1.00)
GFR, Estimated: 26 mL/min — ABNORMAL LOW (ref >60)
Glucose, Bld: 95 mg/dL (ref 70–99)
Potassium: 4.1 mmol/L (ref 3.5–5.1)
Sodium: 139 mmol/L (ref 135–145)

## 2021-04-11 LAB — ECHOCARDIOGRAM COMPLETE
AR max vel: 0.56 cm2
AV Area VTI: 0.48 cm2
AV Area mean vel: 0.49 cm2
AV Mean grad: 28.8 mmHg
AV Peak grad: 42.8 mmHg
Ao pk vel: 3.27 m/s
Height: 64 in
S' Lateral: 2.7 cm
Weight: 2250.46 oz

## 2021-04-11 LAB — GLUCOSE, CAPILLARY
Glucose-Capillary: 92 mg/dL (ref 70–99)
Glucose-Capillary: 104 mg/dL — ABNORMAL HIGH (ref 70–99)

## 2021-04-11 LAB — HEMOGLOBIN A1C
Hgb A1c MFr Bld: 5.9 % — ABNORMAL HIGH (ref 4.8–5.6)
Mean Plasma Glucose: 122.63 mg/dL

## 2021-04-11 MED ORDER — METOPROLOL TARTRATE 25 MG PO TABS: 100.0000 mg | ORAL_TABLET | Freq: Two times a day (BID) | ORAL | Status: DC

## 2021-04-11 MED ORDER — APIXABAN 2.5 MG PO TABS
2.5000 mg | ORAL_TABLET | Freq: Two times a day (BID) | ORAL | Status: DC
  Administered 2021-04-11 – 2021-04-15 (×10): 2.5 mg via ORAL
  Filled 2021-04-11 (×10): qty 1

## 2021-04-11 MED ORDER — DILTIAZEM HCL 30 MG PO TABS
30.0000 mg | ORAL_TABLET | Freq: Four times a day (QID) | ORAL | Status: DC
  Administered 2021-04-11 – 2021-04-12 (×3): 30 mg via ORAL
  Filled 2021-04-11 (×3): qty 1

## 2021-04-11 MED ORDER — FUROSEMIDE 10 MG/ML IJ SOLN
40.0000 mg | Freq: Every day | INTRAMUSCULAR | Status: DC
  Administered 2021-04-11: 40 mg via INTRAVENOUS
  Filled 2021-04-11: qty 4

## 2021-04-11 MED ORDER — ORAL CARE MOUTH RINSE
15.0000 mL | Freq: Two times a day (BID) | OROMUCOSAL | Status: DC
  Administered 2021-04-11 – 2021-04-15 (×3): 15 mL via OROMUCOSAL

## 2021-04-11 MED ORDER — LINACLOTIDE 145 MCG PO CAPS: 145.0000 ug | ORAL_CAPSULE | Freq: Every day | ORAL | Status: DC | PRN

## 2021-04-11 MED ORDER — PANTOPRAZOLE SODIUM 40 MG PO TBEC
40.0000 mg | DELAYED_RELEASE_TABLET | Freq: Every day | ORAL | Status: DC
  Administered 2021-04-11 – 2021-04-15 (×5): 40 mg via ORAL
  Filled 2021-04-11 (×5): qty 1

## 2021-04-11 MED ORDER — METOPROLOL TARTRATE 100 MG PO TABS
100.0000 mg | ORAL_TABLET | Freq: Two times a day (BID) | ORAL | Status: DC
  Administered 2021-04-11 – 2021-04-15 (×9): 100 mg via ORAL
  Filled 2021-04-11 (×9): qty 1

## 2021-04-11 MED ORDER — FERROUS SULFATE 325 (65 FE) MG PO TABS
325.0000 mg | ORAL_TABLET | Freq: Every day | ORAL | Status: DC
Start: 2021-04-11 — End: 2021-04-15
  Administered 2021-04-11 – 2021-04-15 (×5): 325 mg via ORAL
  Filled 2021-04-11 (×5): qty 1

## 2021-04-11 MED ORDER — ALPRAZOLAM 0.5 MG PO TABS
0.5000 mg | ORAL_TABLET | Freq: Two times a day (BID) | ORAL | Status: DC | PRN
  Administered 2021-04-12 – 2021-04-14 (×3): 0.5 mg via ORAL
  Filled 2021-04-11 (×3): qty 1

## 2021-04-11 MED ORDER — INSULIN ASPART 100 UNIT/ML IJ SOLN: 0.0000 [IU] | Freq: Three times a day (TID) | INTRAMUSCULAR | Status: DC

## 2021-04-11 MED ORDER — FUROSEMIDE 10 MG/ML IJ SOLN
40.0000 mg | Freq: Two times a day (BID) | INTRAMUSCULAR | Status: DC
  Administered 2021-04-12: 40 mg via INTRAVENOUS
  Filled 2021-04-11: qty 4

## 2021-04-11 NOTE — Assessment & Plan Note (Addendum)
CKD stage 3b, hypokalemia Baseline creatinine around 1.6, creatinine trended up to 2.0 -Creatinine stable now

## 2021-04-11 NOTE — Assessment & Plan Note (Addendum)
Acute diastolic CHF (congestive heart failure) (HCC) Aortic stenosis Echocardiogram with preserved LV systolic function EF 60 to 65%, mod LVH, bioprosthetic aortic valve with concern for significant stenosis  -Diuresed with IV Lasix, she is 4.2 L negative, appears euvolemic, -Appreciate cardiology input, plan for conservative management of aortic stenosis -Transition to to p.o. Lasix today -Ambulate, PT eval, discharge planning

## 2021-04-11 NOTE — Hospital Course (Addendum)
[name] was admitted to the hospital with the working diagnosis of acute heart failure decompensation in the setting of atrial fibrillation with rapid ventricular response.   88 yo female with the past medical history of sick sinus syndrome sp pacer, atrial fibrillation and flutter, sp tissue aortic valve replacement, CAD, HTN, CKD stage 3b and dyslipidemia who presented with dyspnea. Limited history due to cognitive impairment, apparently she had 2 weeks of worsening dyspnea leading to her hospitalization, that was refractive to increase dose of oral diuretic therapy as outpatient. On her initial physical examination her oxymetry was in the 80's on 3 L/in per [place], blood pressure 181/118, HR 139, RR 27, lungs with no wheezing or rhonchi, heart with S1 and S2 present and tachycardic, regular with no gallop or murmurs, abdomen soft and non tender, no lower extremity edema.   Na 139, K 3,9, CL 101, bicarbonate at 23, glucose 125 bun 24 and cr 1,63  BNP 1,007  WBC 16, hgb 10,4, hct 33,8 and plt 257  Sars covid 19 negative  Urine analysis with SG 1.005 with trace leukocytes.   Chest radiograph with cardiomegaly with hilar vascular congestion and bilateral pleural effusions more right than left.   EKG 94 bpm, left axis deviation, normal Qtc, sinus rhythm with poor R wave progression, with no significant ST segment or T wave changes.   Patient was placed on furosemide IV for diuresis and diltiazem infusion for rate control. Improvement in her symptoms and now transitioned to oral medications. Possible dc home in 24 to 48 hrs.

## 2021-04-11 NOTE — Assessment & Plan Note (Addendum)
-  Stable continue with metoprolol and diltiazem Transition to oral furosemide

## 2021-04-11 NOTE — Progress Notes (Signed)
Echocardiogram 2D Echocardiogram has been performed.  Oneal Deputy Reyes [place] RDCS 04/11/2021, 10:50 AM

## 2021-04-11 NOTE — Progress Notes (Signed)
Mobility Specialist Progress Note:   04/11/21 1130  Mobility  Activity Transferred from bed to chair  Level of Assistance Minimal assist, patient does 75% or more  Assistive Device Front wheel walker  Distance Ambulated (ft) 2 ft  Activity Response Tolerated well  $Mobility charge 1 Mobility   Pt eager to transfer to chair this am. Required minA to get EOB, supervision for transfer. Pt left sitting up in chair with all needs met.   Nelta Numbers Mobility Specialist  Phone: [phone]

## 2021-04-11 NOTE — Assessment & Plan Note (Addendum)
-   Stable, continue iron supplementation

## 2021-04-11 NOTE — TOC Progression Note (Addendum)
Transition of Care [facility]) - Progression Note    Patient Details  Name: ARGELIA FORMISANO MRN: [id number] Date of Birth: 10/03/1929  [facility]) CM/SW Contact  Zenon Mayo, RN Phone Number: 04/11/2021, 2:28 PM  Clinical Narrative:    NCM spoke with patient at bedside, she gave permission to this NCM to call her daughter.  NCM left vm for daughter [name] to return call.    1515 2/13- NCM received call back from daughter , [name], states she lives with patient, and patient has a rolling walker, cane, scale and bp cuff.  She states patient states she is going to eat whatever she wants to eat at this point since she is 88 yo.  She states the PCP office is working on getting a w/chair for patient already.  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.  NCM offered choice to daughter for [facility] services if needed, states they do not have a preference, will await pt evaluation. Daughter , [name] states she will transport patient home at discharge. TOC will continue to follow for dc needs.         Expected Discharge Plan and Services                                                 Social Determinants of Health (SDOH) Interventions    Readmission Risk Interventions No flowsheet data found.

## 2021-04-11 NOTE — Assessment & Plan Note (Addendum)
Persistent atrial fibrillation with RVR -s/p pacer for SSS -Heart rate improving, continue metoprolol 100 mg twice daily and diltiazem 240 -Continue apixaban

## 2021-04-11 NOTE — ED Notes (Signed)
Breakfast orders Placed °

## 2021-04-11 NOTE — Progress Notes (Signed)
Progress Note   Patient: [name] FIE:[id number] DOB: 03-Oct-1929 DOA: 04/10/2021     1 DOS: the patient was seen and examined on 04/11/2021   Brief hospital course: [name] was admitted to the hospital with the working diagnosis of acute heart failure decompensation.   88 yo female with the past medical history of sick sinus syndrome sp pacer, atrial fibrillation and flutter, sp tissue aortic valve replacement, CAD, HTN, CKD stage 3b and dyslipidemia who presented with dyspnea. Limited history due to cognitive impairment, apparently she had 2 weeks of worsening dyspnea leading to her hospitalization, that was refractive to increase dose of oral diuretic therapy as outpatient. On her initial physical examination her oxymetry was in the 80's on 3 L/in per [place], blood pressure 181/118, HR 139, RR 27, lungs with no wheezing or rhonchi, heart with S1 and S2 present and tachycardic, regular with no gallop or murmurs, abdomen soft and non tender, no lower extremity edema.   Na 139, K 3,9, CL 101, bicarbonate at 23, glucose 125 bun 24 and cr 1,63  BNP 1,007  WBC 16, hgb 10,4, hct 33,8 and plt 257  Sars covid 19 negative  Urine analysis with SG 1.005 with trace leukocytes.   Chest radiograph with cardiomegaly with hilar vascular congestion and bilateral pleural effusions more right than left.   EKG 94 bpm, left axis deviation, normal Qtc, sinus rhythm with poor R wave progression, with no significant ST segment or T wave changes.   Assessment and Plan: * Acute CHF (congestive heart failure) ([place]) Patient continue to have edema of her lower extremities.  Acute hypoxemic respiratory failure due to cardiogenic pulmonary edema.  Urine output is documented at 800 cc.  Systolic blood pressure 166 range.  Oxymetry is 96% on 2 L/min per [place].   Plan to continue diuresis with furosemide, increase dose to 40 mg IV q12 hrs.  Continue with metoprolol 100 mg po bid.   Follow up echocardiogram with  preserved LV systolic function EF 60 to 65%, with significant aortic valve stenosis.    Persistent atrial fibrillation ([place])- (present on admission) Patient sp pacer for sick sinus rhythm.  Atrial fibrillation with rapid ventricular rate.  Patient has been placed on diltiazem drip. Telemetry with atrial fibrillation and ventricular paced rhythm in th 80 range.   Plan to continue metoprolol 100 mg po bid and will transition to oral diltiazem 30 mg q6 hrs and continue telemetry monitoring Anticoagulation with apixaban.   Acute kidney injury superimposed on chronic kidney disease ([place])- (present on admission) CKD stage 3b, hypokalemia Renal function with serum cr at 1,81 with K at 4,1 and serum bicarbonate at 23. Plan to continue diuresis with furosemide, increase dose to bid, and follow up renal function in am.  Check Mg.   Type 2 diabetes mellitus with hyperlipidemia ([place])- (present on admission) Fasting glucose is 95 Patient with poor oral intake, plan to continue close glucose monitoring with prn CBG.  Consult nutrition.   Essential hypertension, benign- (present on admission) Continue blood pressure monitoring, patient is on metoprolol and will add oral diltiazem for atrial fibrillation better rate control.   Iron deficiency anemia- (present on admission) Continue with oral iron supplementation.  Hgb is stable.         Subjective: Patient with no chest pain, dyspnea has improved, but continue with lower extremity edema,   Physical Exam: Vitals:   04/11/21 0855 04/11/21 0900 04/11/21 0905 04/11/21 0942  BP: 139/63 136/68 134/63 124/79  Pulse: (!) 54 70 86 (!) 101  Resp: (!) 28 (!) 27 (!) 28 20  Temp:    97.9 F (36.6 C)  TempSrc:    Oral  SpO2: 98% 97% 98% 96%  Weight:    63.8 kg  Height:    5\' 4"  (1.626 m)   Neurology patient is awake and alert ENT with mild pallor Cardiovascular with heart S1 and S2 present irregularly irregular with positive systolic murmur at  the right sternal border. Mild JVD Positive lower extremity edema ++ pitting bilaterally  Respiratory with no wheezing or rales, no rhonchi Abdomen soft and non tender   Data Reviewed:    Family Communication: no family at the bedside   Disposition: Status is: Inpatient Remains inpatient appropriate because: heart failure management.      Planned Discharge Destination: Home   Author: Tawni Millers, MD 04/11/2021 3:46 PM  For on call review [email or web address].

## 2021-04-11 NOTE — Assessment & Plan Note (Addendum)
-   CBGs in normal range, and insulin discontinued

## 2021-04-12 DIAGNOSIS — D509 Iron deficiency anemia, unspecified: Secondary | ICD-10-CM | POA: Diagnosis not present

## 2021-04-12 DIAGNOSIS — I10 Essential (primary) hypertension: Secondary | ICD-10-CM | POA: Diagnosis not present

## 2021-04-12 DIAGNOSIS — I509 Heart failure, unspecified: Secondary | ICD-10-CM | POA: Diagnosis not present

## 2021-04-12 DIAGNOSIS — N179 Acute kidney failure, unspecified: Secondary | ICD-10-CM | POA: Diagnosis not present

## 2021-04-12 LAB — BASIC METABOLIC PANEL
Anion gap: 13 (ref 5–15)
BUN: 28 mg/dL — ABNORMAL HIGH (ref 8–23)
CO2: 25 mmol/L (ref 22–32)
Calcium: 8.3 mg/dL — ABNORMAL LOW (ref 8.9–10.3)
Chloride: 101 mmol/L (ref 98–111)
Creatinine, Ser: 2.09 mg/dL — ABNORMAL HIGH (ref 0.44–1.00)
GFR, Estimated: 22 mL/min — ABNORMAL LOW (ref >60)
Glucose, Bld: 97 mg/dL (ref 70–99)
Potassium: 3.8 mmol/L (ref 3.5–5.1)
Sodium: 139 mmol/L (ref 135–145)

## 2021-04-12 LAB — MAGNESIUM: Magnesium: 1.6 mg/dL — ABNORMAL LOW (ref 1.7–2.4)

## 2021-04-12 MED ORDER — MAGNESIUM SULFATE 2 GM/50ML IV SOLN
2.0000 g | Freq: Once | INTRAVENOUS | Status: AC
  Administered 2021-04-12: 2 g via INTRAVENOUS
  Filled 2021-04-12: qty 50

## 2021-04-12 MED ORDER — FUROSEMIDE 40 MG PO TABS
40.0000 mg | ORAL_TABLET | Freq: Every day | ORAL | Status: DC
  Filled 2021-04-12: qty 1

## 2021-04-12 MED ORDER — DILTIAZEM HCL ER COATED BEADS 240 MG PO CP24
240.0000 mg | ORAL_CAPSULE | Freq: Every day | ORAL | Status: DC
  Administered 2021-04-12 – 2021-04-13 (×2): 240 mg via ORAL
  Filled 2021-04-12 (×2): qty 1

## 2021-04-12 NOTE — Evaluation (Addendum)
Physical Therapy Evaluation Patient Details Name: [name] MRN: [id number] DOB: [birth year] Today's Date: 04/12/2021  History of Present Illness  88 yo female admitted 2/21  presenting with dyspnea and concern acute heart failure with decompensation. PMH sick sinus syndrome s/p pace maker, atrial fibrillation and flutter, s/p tissue aortic valve replacement, HTN, dyslipidemia, CAD, CKD stage 3b  Clinical Impression  Pt admitted with above diagnosis. Pt was able to ambulate to bathroom and back.  Pt did have incr HR to 133 bpm and desaturation to 85% on RA.  Needed 2LO2 to keep sats >90%.  Should progress well.  Pt currently with functional limitations due to the deficits listed below (see PT Problem List). Pt will benefit from skilled PT to increase their independence and safety with mobility to allow discharge to the venue listed below.          Recommendations for follow up therapy are one component of a multi-disciplinary discharge planning process, led by the attending physician.  Recommendations may be updated based on patient status, additional functional criteria and insurance authorization.  Follow Up Recommendations Home health PT    Assistance Recommended at Discharge Set up Supervision/Assistance  Patient can return home with the following  A little help with walking and/or transfers;A little help with bathing/dressing/bathroom    Equipment Recommendations  (pt wants shower chair but may want HH to determine the kind), ?O2  Recommendations for Other Services       Functional Status Assessment Patient has had a recent decline in their functional status and demonstrates the ability to make significant improvements in function in a reasonable and predictable amount of time.     Precautions / Restrictions Precautions Precautions: Fall Restrictions Weight Bearing Restrictions: No      Mobility  Bed Mobility Overal bed mobility: Needs Assistance Bed Mobility: Supine  to Sit, Sit to Supine     Supine to sit: Min guard Sit to supine: Supervision   General bed mobility comments: Pt needed cues only to exit and reenter bed.    Transfers Overall transfer level: Needs assistance Equipment used: Rolling walker (2 wheels) Transfers: Sit to/from Stand Sit to Stand: Min guard           General transfer comment: Cues for hand placement to RW.    Ambulation/Gait Ambulation/Gait assistance: Min guard Gait Distance (Feet): 40 Feet (20 feet x 2) Assistive device: Rolling walker (2 wheels) Gait Pattern/deviations: Step-through pattern, Decreased stride length, Trunk flexed, Drifts right/left   Gait velocity interpretation: <1.31 ft/sec, indicative of household ambulator   General Gait Details: Pt needed assist to steer RW as well as to stay close to rW. Pt is steady overall with RW just needs to have better technique at times.  Stairs            Wheelchair Mobility    Modified Rankin (Stroke Patients Only)       Balance Overall balance assessment: Mild deficits observed, not formally tested, Needs assistance Sitting-balance support: No upper extremity supported, Feet supported Sitting balance-Leahy Scale: Fair     Standing balance support: Bilateral upper extremity supported, During functional activity Standing balance-Leahy Scale: Poor Standing balance comment: relies on UE support on RW                             Pertinent Vitals/Pain Pain Assessment Pain Assessment: No/denies pain    Home Living Family/patient expects to be discharged to:: Private residence Living  Arrangements: Children Available Help at Discharge: Family;Available PRN/intermittently;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) Type of Home: Apartment Home Access: Stairs to enter   Entrance Stairs-Number of Steps: 3   Home Layout: One level Home Equipment: Conservation officer, nature (2 wheels);Rollator (4 wheels);Cane - single point;Toilet  riser Additional Comments: Pt states if her daughter leaves her she will have someone come in to stay with her    Prior Function Prior Level of Function : Needs assist             Mobility Comments: Pt said she could walk with rw without assist       Hand Dominance   Dominant Hand: Right    Extremity/Trunk Assessment   Upper Extremity Assessment Upper Extremity Assessment: Defer to OT evaluation    Lower Extremity Assessment Lower Extremity Assessment: Overall WFL for tasks assessed    Cervical / Trunk Assessment Cervical / Trunk Assessment: Kyphotic  Communication   Communication: HOH  Cognition Arousal/Alertness: Awake/alert Behavior During Therapy: WFL for tasks assessed/performed Overall Cognitive Status: History of cognitive impairments - at baseline                                          General Comments General comments (skin integrity, edema, etc.): HR 98-133 bpm with activity.  Sats on 2L 96% however on RA 85%.  Reapplied O2 with activity and sats 92% on 2L.    Exercises General Exercises - Lower Extremity Ankle Circles/Pumps: AROM, Both, 10 reps, Supine Long Arc Quad: AROM, Both, 10 reps, Seated   Assessment/Plan    PT Assessment Patient needs continued PT services  PT Problem List Decreased activity tolerance;Decreased balance;Decreased mobility;Decreased knowledge of use of DME;Decreased safety awareness;Decreased knowledge of precautions;Cardiopulmonary status limiting activity;Decreased skin integrity       PT Treatment Interventions DME instruction;Gait training;Functional mobility training;Therapeutic activities;Therapeutic exercise;Stair training;Balance training;Patient/family education    PT Goals (Current goals can be found in the Care Plan section)  Acute Rehab PT Goals Patient Stated Goal: to go home PT Goal Formulation: With patient Time For Goal Achievement: 04/26/21 Potential to Achieve Goals: Good    Frequency  Min 3X/week     Co-evaluation               AM-PAC PT "6 Clicks" Mobility  Outcome Measure Help needed turning from your back to your side while in a flat bed without using bedrails?: None Help needed moving from lying on your back to sitting on the side of a flat bed without using bedrails?: A Little Help needed moving to and from a bed to a chair (including a wheelchair)?: A Little Help needed standing up from a chair using your arms (e.g., wheelchair or bedside chair)?: A Little Help needed to walk in hospital room?: A Little Help needed climbing 3-5 steps with a railing? : A Lot 6 Click Score: 18    End of Session Equipment Utilized During Treatment: Gait belt;Oxygen Activity Tolerance: Patient limited by fatigue Patient left: with call bell/phone within reach;in bed;with bed alarm set Nurse Communication: Mobility status PT Visit Diagnosis: Muscle weakness (generalized) (M62.81)    Time: 5809-9833 PT Time Calculation (min) (ACUTE ONLY): 22 min   Charges:   PT Evaluation $PT Eval Moderate Complexity: 1 Mod          Keri Veale M,PT Acute Rehab Services [phone] (pager)   Arrie Aran  Samson Frederic 04/12/2021, 12:26 PM

## 2021-04-12 NOTE — Progress Notes (Signed)
Heart Failure Navigator Progress Note  Assessed for Heart & Vascular TOC clinic readiness.  Patient does not meet criteria due to cognitive impairment.   Pricilla Holm, MSN, RN Heart Failure Nurse Navigator [phone]

## 2021-04-12 NOTE — Progress Notes (Signed)
SATURATION QUALIFICATIONS: (This note is used to comply with regulatory documentation for home oxygen)  Patient Saturations on Room Air at Rest = 88%  Patient Saturations on Room Air while Ambulating = 85%  Patient Saturations on 2 Liters of oxygen while Ambulating = 92%  Please briefly explain why patient needs home oxygen:Pt with desaturation to on RA at rest and with activity.Currently needs O2.  Iridessa Harrow M,PT Acute Rehab Services [phone] (pager)

## 2021-04-12 NOTE — Progress Notes (Addendum)
Progress Note   Patient: [name] PPJ:[id number] DOB: January 25, 1930 DOA: 04/10/2021     2 DOS: the patient was seen and examined on 04/12/2021   Brief hospital course: [name] was admitted to the hospital with the working diagnosis of acute heart failure decompensation in the setting of atrial fibrillation with rapid ventricular response.   88 yo female with the past medical history of sick sinus syndrome sp pacer, atrial fibrillation and flutter, sp tissue aortic valve replacement, CAD, HTN, CKD stage 3b and dyslipidemia who presented with dyspnea. Limited history due to cognitive impairment, apparently she had 2 weeks of worsening dyspnea leading to her hospitalization, that was refractive to increase dose of oral diuretic therapy as outpatient. On her initial physical examination her oxymetry was in the 80's on 3 L/in per [place], blood pressure 181/118, HR 139, RR 27, lungs with no wheezing or rhonchi, heart with S1 and S2 present and tachycardic, regular with no gallop or murmurs, abdomen soft and non tender, no lower extremity edema.   Na 139, K 3,9, CL 101, bicarbonate at 23, glucose 125 bun 24 and cr 1,63  BNP 1,007  WBC 16, hgb 10,4, hct 33,8 and plt 257  Sars covid 19 negative  Urine analysis with SG 1.005 with trace leukocytes.   Chest radiograph with cardiomegaly with hilar vascular congestion and bilateral pleural effusions more right than left.   EKG 94 bpm, left axis deviation, normal Qtc, sinus rhythm with poor R wave progression, with no significant ST segment or T wave changes.   Patient was placed on furosemide IV for diuresis and diltiazem infusion for rate control. Improvement in her symptoms and now transitioned to oral medications. Possible dc home in 24 to 48 hrs.   Assessment and Plan: * Acute CHF (congestive heart failure) ([facility]) Echocardiogram with preserved LV systolic function EF 60 to 65%, with significant aortic valve stenosis.    Urine output over last  24 hrs is 1,150 ml Blood pressure has been stable 130 to 150 mmHg.   Plan to transition to oral furosemide in am. Continue rate control atrial fibrillation with metoprolol and diltiazem.  Patient with aortic stenosis but considering her physical functional capacity,  and comorbid conditions, likely not candidate for invasive interventions.  SP biologic aortic valve prosthesis in 2005. Follows up with [name] as outpatient.   Acute hypoxemic respiratory failure with positive oxygen desaturation on ambulation, will need home 02 at discharge.     Persistent atrial fibrillation ([name])- (present on admission) Patient sp pacer for sick sinus rhythm,  This admission with atrial fibrillation with rapid ventricular rate.   Old records personally reviewed, patient with apparently worsening atrial fibrillation burden per pacer interrogation.   Her telemetry personally reviewed continue atrial fibrillation rhythm with rate in the 90 to 100,  Plan to resume oral diltiazem 240 mg long acting and continue with metoprolol 100 mg po bid.  Continue telemetry monitoring and continue anticoagulation with apixaban.   Acute kidney injury superimposed on chronic kidney disease ([name])- (present on admission) CKD stage 3b, hypokalemia Renal function with serum cr at 2,0 with K at 3,8 and serum bicarbonate at 25. Mg is 1,6  Improved volume status. Plan to follow up renal function in am. Add 2 g mag sulfate. Transition to oral furosemide in am.   Type 2 diabetes mellitus with hyperlipidemia ([name])- (present on admission) Patient with controlled serum glucose, this am fasting glucose is 97. Plan to discontinue insulin therapy.  Follow with  nutrition recommendations.   Essential hypertension, benign- (present on admission) Blood pressure has remained stable, continue with metoprolol and diltiazem Transition to oral furosemide.   Iron deficiency anemia- (present on admission) Oral iron supplementation  with stable cell count.         Subjective: patient is feeling better, her dyspnea and edema have improved, she has no palpitations   Physical Exam: Vitals:   04/12/21 0820 04/12/21 1010 04/12/21 1120 04/12/21 1149  BP: 130/73   135/77  Pulse:    87  Resp: 18   20  Temp: (!) 97.4 F (36.3 C)   99.2 F (37.3 C)  TempSrc: Oral   Oral  SpO2: 97% 99% 93% 95%  Weight:      Height:       Neurology awake and alert ENT with mild pallor  Cardiovascular with heart S1 and S2 present irregularly irregular with positive systolic murmur at the right sternal border, no gallops and no rubs No JVD Trace lower extremity edema Respiratory with no wheezing or rales, no rhonchi  Abdomen soft and non tender  Data Reviewed:    Family Communication: I spoke with patient's son at the bedside, we talked in detail about patient's condition, plan of care and prognosis and all questions were addressed.   Disposition: Status is: Inpatient Remains inpatient appropriate because: heart failure and atrial fibrillation       Planned Discharge Destination: Home     Author: Tawni Millers, MD 04/12/2021 2:10 PM  For on call review [email or web address].

## 2021-04-12 NOTE — Evaluation (Signed)
Occupational Therapy Evaluation Patient Details Name: [name] MRN: [id number] DOB: 12-28-1929 Today's Date: 04/12/2021   History of Present Illness 88 yo female wiht PMH sick sinus syndrome s/p pace maker, atrial fibrillation and flutter, s/p tissue aortic valve replacement, HTN, dyslipidemia, CAD, CKD stage 3b presenting with dyspnea and concern acute heart failure with decompensation   Clinical Impression   88 yo female admitted with dyspnea and concern for acute heart failure  with decompensation. Pt is HOH thus limiting history obtained. Pt appears close to baseline as she is able to walk short distance with RW with min guard to min A. She reports she receives assist for ADLs from CNA and daughter. She prefers to bathe herself but is currently limited by not having a shower chair and believes she may need to accept more help for this activity. Pt returned to bed at end of session due to "feeling weak." Her vitals were stable throughout session. Recommend consideration of HHOT for home safety eval. See below for equip recommendations. She says she would prefer getting a RW with larger wheels because hers does not roll very well. Will follow acutely at low frequency to assist with progressing ADLs, education and preventing debility.      Recommendations for follow up therapy are one component of a multi-disciplinary discharge planning process, led by the attending physician.  Recommendations may be updated based on patient status, additional functional criteria and insurance authorization.   Follow Up Recommendations  Home health OT    Assistance Recommended at Discharge Frequent or constant Supervision/Assistance  Patient can return home with the following A little help with walking and/or transfers;A lot of help with bathing/dressing/bathroom;Direct supervision/assist for medications management    Functional Status Assessment  Patient has had a recent decline in their functional  status and/or demonstrates limited ability to make significant improvements in function in a reasonable and predictable amount of time  Equipment Recommendations  BSC/3in1;Tub/shower seat    Recommendations for Other Services  PT, mobility aid      Precautions / Restrictions Precautions Precautions: Fall Restrictions Weight Bearing Restrictions: No      Mobility Bed Mobility Overal bed mobility: Needs Assistance          Transfers Overall transfer level: Needs assistance Equipment used: Rolling walker (2 wheels) Transfers: Sit to/from Stand          Balance Overall balance assessment: Mild deficits observed, not formally tested               ADL either performed or assessed with clinical judgement   ADL Overall ADL's : Needs assistance/impaired Eating/Feeding: Set up   Grooming: Set up;Bed level   Upper Body Bathing: Minimal assistance   Lower Body Bathing: Maximal assistance   Upper Body Dressing : Min guard;Minimal assistance   Lower Body Dressing: Maximal assistance   Toilet Transfer: Minimal assistance   Toileting- Clothing Manipulation and Hygiene: Minimal assistance       Functional mobility during ADLs: Minimal assistance       Vision Baseline Vision/History: 1 Wears glasses Vision Assessment?: No apparent visual deficits            Pertinent Vitals/Pain Pain Assessment Pain Assessment: No/denies pain        Extremity/Trunk Assessment Upper Extremity Assessment Upper Extremity Assessment: Generalized weakness   Lower Extremity Assessment Lower Extremity Assessment: Generalized weakness       Communication Communication Communication: HOH   Cognition Arousal/Alertness: Awake/alert Behavior During Therapy: WFL for tasks assessed/performed  Overall Cognitive Status: History of cognitive impairments - at baseline                       General Comments  excessive dry skin, fragile skin with high risk for  breakdown            Home Living Family/patient expects to be discharged to:: Private residence Living Arrangements: Children Available Help at Discharge: Family;Available PRN/intermittently;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) Type of Home: Apartment Home Access: Stairs to enter Entrance Stairs-Number of Steps: 3   Home Layout: One level         Biochemist, clinical: Standard     Home Equipment: Conservation officer, nature (2 wheels);Rollator (4 wheels);Cane - single point;Toilet riser   Additional Comments: wants a shower chair      Prior Functioning/Environment Prior Level of Function : Needs assist                OT Problem List: Decreased strength;Decreased activity tolerance;Impaired balance (sitting and/or standing);Decreased safety awareness;Cardiopulmonary status limiting activity      OT Treatment/Interventions: Self-care/ADL training;Patient/family education;Therapeutic activities;Energy conservation    OT Goals(Current goals can be found in the care plan section) Acute Rehab OT Goals Patient Stated Goal: Decrease risk of falls OT Goal Formulation: With patient Time For Goal Achievement: 04/26/21 Potential to Achieve Goals: Fair ADL Goals Pt Will Perform Grooming: sitting;with modified independence Pt Will Perform Upper Body Bathing: with set-up;sitting Pt Will Perform Upper Body Dressing: with set-up;sitting Pt Will Transfer to Toilet: with supervision;ambulating Pt Will Perform Toileting - Clothing Manipulation and hygiene: with modified independence;sit to/from stand  OT Frequency: Min 1X/week       AM-PAC OT "6 Clicks" Daily Activity     Outcome Measure Help from another person eating meals?: A Little Help from another person taking care of personal grooming?: A Little Help from another person toileting, which includes using toliet, bedpan, or urinal?: A Little Help from another person bathing (including washing, rinsing, drying)?: A  Lot Help from another person to put on and taking off regular upper body clothing?: A Little Help from another person to put on and taking off regular lower body clothing?: A Lot 6 Click Score: 16   End of Session Equipment Utilized During Treatment: Rolling walker (2 wheels)  Activity Tolerance: Patient limited by fatigue Patient left: in bed;with bed alarm set  OT Visit Diagnosis: Other abnormalities of gait and mobility (R26.89);Muscle weakness (generalized) (M62.81)                Time: 1040-1102 OT Time Calculation (min): 22 min Charges:  OT General Charges $OT Visit: 1 Visit OT Evaluation $OT Eval Low Complexity: 1 Low OT Treatments $Therapeutic Activity: 8-22 mins   Naomie Dean Demeshia Sherburne, OTR/L 04/12/2021, 11:26 AM

## 2021-04-13 ENCOUNTER — Ambulatory Visit (INDEPENDENT_AMBULATORY_CARE_PROVIDER_SITE_OTHER): Payer: Medicare Other

## 2021-04-13 DIAGNOSIS — I495 Sick sinus syndrome: Secondary | ICD-10-CM

## 2021-04-13 LAB — BASIC METABOLIC PANEL
Anion gap: 13 (ref 5–15)
BUN: 24 mg/dL — ABNORMAL HIGH (ref 8–23)
CO2: 24 mmol/L (ref 22–32)
Calcium: 8.7 mg/dL — ABNORMAL LOW (ref 8.9–10.3)
Chloride: 102 mmol/L (ref 98–111)
Creatinine, Ser: 1.85 mg/dL — ABNORMAL HIGH (ref 0.44–1.00)
GFR, Estimated: 26 mL/min — ABNORMAL LOW (ref >60)
Glucose, Bld: 97 mg/dL (ref 70–99)
Potassium: 3.9 mmol/L (ref 3.5–5.1)
Sodium: 139 mmol/L (ref 135–145)

## 2021-04-13 MED ORDER — POTASSIUM CHLORIDE CRYS ER 20 MEQ PO TBCR
40.0000 meq | EXTENDED_RELEASE_TABLET | Freq: Once | ORAL | Status: AC
  Administered 2021-04-13: 40 meq via ORAL
  Filled 2021-04-13: qty 2

## 2021-04-13 MED ORDER — DILTIAZEM HCL ER COATED BEADS 180 MG PO CP24
300.0000 mg | ORAL_CAPSULE | Freq: Every day | ORAL | Status: DC
  Administered 2021-04-14 – 2021-04-15 (×2): 300 mg via ORAL
  Filled 2021-04-13 (×2): qty 1

## 2021-04-13 MED ORDER — FUROSEMIDE 10 MG/ML IJ SOLN
40.0000 mg | Freq: Two times a day (BID) | INTRAMUSCULAR | Status: DC
  Administered 2021-04-13 – 2021-04-14 (×3): 40 mg via INTRAVENOUS
  Filled 2021-04-13 (×3): qty 4

## 2021-04-13 NOTE — Progress Notes (Signed)
Pt verbally abusive to multiple staff members. Pt A+O x 4. No acute changes.

## 2021-04-13 NOTE — Progress Notes (Signed)
Occupational Therapy Treatment Patient Details Name: [name] MRN: [id number] DOB: June 23, 1929 Today's Date: 04/13/2021   History of present illness 88 yo female admitted 2/21  presenting with dyspnea and concern acute heart failure with decompensation. PMH sick sinus syndrome s/p pace maker, atrial fibrillation and flutter, s/p tissue aortic valve replacement, HTN, dyslipidemia, CAD, CKD stage 3b   OT comments  Patient continues to make steady progress towards goals in skilled OT session. Patient's session encompassed  functional mobility, transfers, toileting and ADLs. Patient at min A level for transfers and ambulation, and able to complete functional mobility on room air with DOE or decrease in oxygen saturation. Patient's daughter present, stating that patient is improving, but remains weak due to overall deconditioning. Discharge remains appropriate, therapy will continue to follow.    Recommendations for follow up therapy are one component of a multi-disciplinary discharge planning process, led by the attending physician.  Recommendations may be updated based on patient status, additional functional criteria and insurance authorization.    Follow Up Recommendations  Home health OT    Assistance Recommended at Discharge Frequent or constant Supervision/Assistance  Patient can return home with the following  A little help with walking and/or transfers;A lot of help with bathing/dressing/bathroom;Direct supervision/assist for medications management   Equipment Recommendations  BSC/3in1;Tub/shower seat    Recommendations for Other Services      Precautions / Restrictions Precautions Precautions: Fall Restrictions Weight Bearing Restrictions: No       Mobility Bed Mobility Overal bed mobility: Needs Assistance Bed Mobility: Supine to Sit, Sit to Supine     Supine to sit: Supervision Sit to supine: Supervision        Transfers Overall transfer level: Needs  assistance Equipment used: Rolling walker (2 wheels) Transfers: Sit to/from Stand Sit to Stand: Min assist           General transfer comment: min A due to impulsivity and decreased standing balance     Balance Overall balance assessment: Mild deficits observed, not formally tested, Needs assistance Sitting-balance support: No upper extremity supported, Feet supported Sitting balance-Leahy Scale: Fair     Standing balance support: Bilateral upper extremity supported, During functional activity Standing balance-Leahy Scale: Poor Standing balance comment: relies on UE support on RW                           ADL either performed or assessed with clinical judgement   ADL Overall ADL's : Needs assistance/impaired     Grooming: Wash/dry hands;Wash/dry face;Sitting;Bed level Grooming Details (indicate cue type and reason): sitting EOB after completing toilet transfer                 Toilet Transfer: Minimal assistance;Ambulation;Rolling walker (2 wheels)   Toileting- Clothing Manipulation and Hygiene: Min guard;Sitting/lateral lean       Functional mobility during ADLs: Minimal assistance;Rolling walker (2 wheels) General ADL Comments: patient closer to baseline per report from daughter    Extremity/Trunk Assessment              Vision       Perception     Praxis      Cognition Arousal/Alertness: Awake/alert Behavior During Therapy: WFL for tasks assessed/performed Overall Cognitive Status: History of cognitive impairments - at baseline  Exercises      Shoulder Instructions       General Comments      Pertinent Vitals/ Pain       Pain Assessment Pain Assessment: No/denies pain  Home Living                                          Prior Functioning/Environment              Frequency  Min 2X/week        Progress Toward Goals  OT Goals(current  goals can now be found in the care plan section)  Progress towards OT goals: Progressing toward goals  Acute Rehab OT Goals Patient Stated Goal: to get better OT Goal Formulation: With patient Time For Goal Achievement: 04/26/21 Potential to Achieve Goals: [place] Discharge plan remains appropriate    Co-evaluation                 AM-PAC OT "6 Clicks" Daily Activity     Outcome Measure   Help from another person eating meals?: A Little Help from another person taking care of personal grooming?: A Little Help from another person toileting, which includes using toliet, bedpan, or urinal?: A Little Help from another person bathing (including washing, rinsing, drying)?: A Little Help from another person to put on and taking off regular upper body clothing?: A Little Help from another person to put on and taking off regular lower body clothing?: A Little 6 Click Score: 18    End of Session Equipment Utilized During Treatment: Rolling walker (2 wheels)  OT Visit Diagnosis: Other abnormalities of gait and mobility (R26.89);Muscle weakness (generalized) (M62.81)   Activity Tolerance Patient tolerated treatment well   Patient Left in bed;with bed alarm set;with family/visitor present   Nurse Communication Mobility status        Time: 4944-9675 OT Time Calculation (min): 12 min  Charges: OT General Charges $OT Visit: 1 Visit OT Treatments $Self Care/Home Management : 8-22 mins  Corinne Ports E. Donshay Lupinski, OTR/L Acute Rehabilitation Services [phone] [place] 04/13/2021, 3:20 PM

## 2021-04-13 NOTE — Progress Notes (Signed)
Pt still refusing tele monitoring. Pt refused morning vitals and weight.

## 2021-04-13 NOTE — TOC Transition Note (Addendum)
Transition of Care [facility]) - CM/SW Discharge Note   Patient Details  Name: [name] MRN: [id number] Date of Birth: 11-07-1929  Transition of Care [facility]) CM/SW Contact:  Zenon Mayo, RN Phone Number: 04/13/2021, 11:34 AM   Clinical Narrative:    NCM offered choice to daughter for [facility] services if needed, states they do not have a preference, will await pt evaluation. Daughter , [name] states she will transport patient home at discharge. NCM made referral to [facility] with [facility] for [place], [place], she is able to take referral.  Soc will begin 24 to 48hrs post dc.  Also NCM made referral to [facility] with [facility] for Home oxygen and shower stool. NCM informed MD need oxygen order.    Final next level of care: [place] Barriers to Discharge: Continued Medical Work up   Patient Goals and CMS Choice Patient states their goals for this hospitalization and ongoing recovery are:: return home   Choice offered to / list presented to : Adult Children  Discharge Placement                       Discharge Plan and Services                DME Arranged: Oxygen, Shower stool DME Agency: [facility] Date DME Agency Contacted: 04/13/21 Time DME Agency Contacted: 23 Representative spoke with at DME Agency: Adela Lank HH Arranged: PT, OT [place] Agency: [facility] Date [place]: 04/13/21 Time [place]: 1134 Representative spoke with at [place]: [place] Interventions     Readmission Risk Interventions No flowsheet data found.

## 2021-04-13 NOTE — Progress Notes (Signed)
Patient emotional this morning. Requesting to speak with her daughter, [name]. RN called patient's daughter. Patient's daughter stated she will be coming today to see her mother.

## 2021-04-13 NOTE — Progress Notes (Addendum)
PROGRESS NOTE    [name]  OAC:[id number] DOB: 06-26-1929 DOA: 04/10/2021 PCP: Imagene Riches, NP  Brief Narrative: 89/F with history of chronic diastolic CHF, SSS status post PPM, permanent A-fib/a flutter on Eliquis, history of bioprosthetic, AVR, CAD, hypertension, CKD 3B presented to the ED with increasing shortness of breath X 2 weeks. -In the ED she was hypoxic, 80s requiring 3 L [place], blood pressure 160s/113, chest x-ray noted bilateral pleural effusion, interstitial edema, BNP> 1000, creatinine was 1.6   Subjective: -Feels okay overall, breathing is improving  Assessment & Plan:  * Acute diastolic CHF (congestive heart failure) (HCC) Aortic stenosis Echocardiogram with preserved LV systolic function EF 60 to 65%, mod LVH, bioprosthetic aortic valve with concern for significant stenosis  -Continue IV Lasix today, she is -2.1 L -Will request cardiology input -Anticipate conservative management due to advanced age and frailty -Ambulate, PT eval  Persistent atrial fibrillation with RVR -s/p pacer for SSS -Heart rate improving, continue metoprolol 100 mg twice daily and diltiazem 240 -Continue apixaban  Iron deficiency anemia- (present on admission) Oral iron supplementation  -Check CBC in a.m.  Acute kidney injury superimposed on chronic kidney disease ([place])- (present on admission) CKD stage 3b, hypokalemia -Baseline creatinine around 1.6, creatinine trended up to 2.0, now 1.8 -Monitor with diuresis  Essential hypertension, benign- (present on admission) Blood pressure has remained stable, continue with metoprolol and diltiazem -IV Lasix as above, anticipate improvement with diuresis  Type 2 diabetes mellitus with hyperlipidemia ([place])- (present on admission) -CBGs are normal, insulin discontinued, monitor periodically  DVT prophylaxis: Apixaban Code Status: DNR Family Communication: No family at bedside, will update daughter  Disposition Plan:   Inpatient  Consultants:  Cardiology  Procedures:   Antimicrobials:    Objective: Vitals:   04/12/21 1628 04/12/21 2027 04/13/21 0932 04/13/21 1110  BP:  (!) 172/82 (!) 141/99 (!) 152/80  Pulse: 93 94 (!) 106 (!) 107  Resp: 19 15 16 16   Temp:  99.3 F (37.4 C) 98.7 F (37.1 C) 97.8 F (36.6 C)  TempSrc:  Oral Oral Oral  SpO2:  92%  95%  Weight:      Height:        Intake/Output Summary (Last 24 hours) at 04/13/2021 1356 Last data filed at 04/13/2021 1228 Gross per 24 hour  Intake 118 ml  Output 600 ml  Net -482 ml   Filed Weights   04/10/21 1550 04/11/21 0942 04/12/21 0447  Weight: 67 kg 63.8 kg 63.2 kg    Examination:  General exam: Pleasant elderly female sitting up in bed, AAO x3, no distress CVS: S1-S2,, irregularly irregular rhythm, systolic murmur Lungs: Decreased breath sounds at bases Abdomen: Soft, nontender, bowel sounds present Extremities: Trace edema Skin: No rashes on exposed skin Psych: Appropriate mood and affect  Data Reviewed:   CBC: Recent Labs  Lab 04/10/21 1555  WBC 16.0*  NEUTROABS 12.2*  HGB 10.4*  HCT 33.8*  MCV 81.4  PLT 010   Basic Metabolic Panel: Recent Labs  Lab 04/10/21 1555 04/10/21 1642 04/11/21 0905 04/12/21 0122 04/13/21 0427  NA 138  --  139 139 139  K 3.9  --  4.1 3.8 3.9  CL 101  --  102 101 102  CO2 23  --  23 25 24   GLUCOSE 125*  --  95 97 97  BUN 24*  --  24* 28* 24*  CREATININE 1.63*  --  1.81* 2.09* 1.85*  CALCIUM 8.4*  --  8.2* 8.3* 8.7*  MG  --  1.3*  --  1.6*  --    GFR: Estimated Creatinine Clearance: 17.8 mL/min (A) (by C-G formula based on SCr of 1.85 mg/dL (H)). Liver Function Tests: Recent Labs  Lab 04/10/21 1555  AST 20  ALT 12  ALKPHOS 57  BILITOT 0.6  PROT 7.3  ALBUMIN 3.3*   No results for input(s): LIPASE, AMYLASE in the last 168 hours. No results for input(s): AMMONIA in the last 168 hours. Coagulation Profile: No results for input(s): INR, PROTIME in the last 168  hours. Cardiac Enzymes: No results for input(s): CKTOTAL, CKMB, CKMBINDEX, TROPONINI in the last 168 hours. BNP (last 3 results) No results for input(s): PROBNP in the last 8760 hours. HbA1C: Recent Labs    04/11/21 0217  HGBA1C 5.9*   CBG: Recent Labs  Lab 04/11/21 0917 04/11/21 1548  GLUCAP 92 104*   Lipid Profile: No results for input(s): CHOL, HDL, LDLCALC, TRIG, CHOLHDL, LDLDIRECT in the last 72 hours. Thyroid Function Tests: No results for input(s): TSH, T4TOTAL, FREET4, T3FREE, THYROIDAB in the last 72 hours. Anemia Panel: No results for input(s): VITAMINB12, FOLATE, FERRITIN, TIBC, IRON, RETICCTPCT in the last 72 hours. Urine analysis:    Component Value Date/Time   COLORURINE COLORLESS (A) 04/10/2021 2042   APPEARANCEUR CLEAR 04/10/2021 2042   LABSPEC 1.005 04/10/2021 2042   PHURINE 5.0 04/10/2021 2042   GLUCOSEU NEGATIVE 04/10/2021 2042   HGBUR SMALL (A) 04/10/2021 2042   BILIRUBINUR NEGATIVE 04/10/2021 2042   BILIRUBINUR NEG 09/02/2014 1110   KETONESUR NEGATIVE 04/10/2021 2042   PROTEINUR 100 (A) 04/10/2021 2042   UROBILINOGEN 2.0 09/02/2014 1110   UROBILINOGEN 0.2 11/13/2009 1539   NITRITE NEGATIVE 04/10/2021 2042   LEUKOCYTESUR TRACE (A) 04/10/2021 2042   Sepsis Labs: @LABRCNTIP (procalcitonin:4,lacticidven:4)  ) Recent Results (from the past 240 hour(s))  Resp Panel by RT-PCR (Flu A&B, Covid) Nasopharyngeal Swab     Status: None   Collection Time: 04/10/21  4:10 PM   Specimen: Nasopharyngeal Swab; Nasopharyngeal(NP) swabs in vial transport medium  Result Value Ref Range Status   SARS Coronavirus 2 by RT PCR NEGATIVE NEGATIVE Final    Comment: (NOTE) SARS-CoV-2 target nucleic acids are NOT DETECTED.  The SARS-CoV-2 RNA is generally detectable in upper respiratory specimens during the acute phase of infection. The lowest concentration of SARS-CoV-2 viral copies this assay can detect is 138 copies/mL. A negative result does not preclude  SARS-Cov-2 infection and should not be used as the sole basis for treatment or other patient management decisions. A negative result may occur with  improper specimen collection/handling, submission of specimen other than nasopharyngeal swab, presence of viral mutation(s) within the areas targeted by this assay, and inadequate number of viral copies(<138 copies/mL). A negative result must be combined with clinical observations, patient history, and epidemiological information. The expected result is Negative.  Fact Sheet for Patients:  EntrepreneurPulse.com.au  Fact Sheet for Healthcare Providers:  IncredibleEmployment.be  This test is no t yet approved or cleared by the [place] FDA and  has been authorized for detection and/or diagnosis of SARS-CoV-2 by FDA under an Emergency Use Authorization (EUA). This EUA will remain  in effect (meaning this test can be used) for the duration of the COVID-19 declaration under Section 564(b)(1) of the Act, 21 U.S.C.section 360bbb-3(b)(1), unless the authorization is terminated  or revoked sooner.       Influenza A by PCR NEGATIVE NEGATIVE Final   Influenza B by PCR NEGATIVE NEGATIVE Final    Comment: (  NOTE) The Xpert Xpress SARS-CoV-2/FLU/RSV plus assay is intended as an aid in the diagnosis of influenza from Nasopharyngeal swab specimens and should not be used as a sole basis for treatment. Nasal washings and aspirates are unacceptable for Xpert Xpress SARS-CoV-2/FLU/RSV testing.  Fact Sheet for Patients: EntrepreneurPulse.com.au  Fact Sheet for Healthcare Providers: IncredibleEmployment.be  This test is not yet approved or cleared by the [place] FDA and has been authorized for detection and/or diagnosis of SARS-CoV-2 by FDA under an Emergency Use Authorization (EUA). This EUA will remain in effect (meaning this test can be used) for the duration of  the COVID-19 declaration under Section 564(b)(1) of the Act, 21 U.S.C. section 360bbb-3(b)(1), unless the authorization is terminated or revoked.  Performed at [facility], [place] [address], [place], [place] [address]      Radiology Studies: No results found.   Scheduled Meds:  apixaban  2.5 mg Oral BID   diltiazem  240 mg Oral Daily   ferrous sulfate  325 mg Oral Q breakfast   furosemide  40 mg Intravenous BID   mouth rinse  15 mL Mouth Rinse BID   metoprolol tartrate  100 mg Oral BID   pantoprazole  40 mg Oral Daily   sodium chloride flush  3 mL Intravenous Q12H   Continuous Infusions:  sodium chloride       LOS: 3 days    Time spent: 62min    Domenic Polite, MD [facility]   04/13/2021, 1:56 PM

## 2021-04-13 NOTE — Consult Note (Addendum)
Cardiology Consultation:   Patient ID: [name] MRN: [id number]; DOB: 1930-02-27  Admit date: 04/10/2021 Date of Consult: 04/13/2021  PCP:  Imagene Riches, NP   [facility] Providers Cardiologist:  Sanda Klein, MD   Patient Profile:   [name] is a 88 y.o. female with a history of non-obstructive CAD noted on cardiac catheterization in 07/2002 paroxysmal atrial fibrillation on Eliquis, sinus node dysfunction s/p dual-chamber PPM in 2005 with a generator change in 2014 and again in 09/2020, aortic stenosis s/p AVR in 2005, hypertension, hyperlipidemia, type 2 diabetes, CKD stage III, and GERD who is being seen for the evaluation of HF and aortic stenosis at the request of Dr. Broadus John.  History of Present Illness:   Ms. Holben is a 88 year old female with the above history who is followed by Dr. Sallyanne Kuster.  Last Echo in 08/2020 showed LVEF of 55-60% with normal wall motion and mild LVH, bioprosthetic aortic valve with trivial AI and mean gradient of 13.5 mmHg, and mild MR.  Last seen by Dr. Sallyanne Kuster in 12/2020 following pacemaker generator change out in 09/2020.  She was doing well from a cardiac standpoint at that point.  Device interrogation showed 83% atrial pacing and 4.6% ventricular pacing with 3.5% atrial fibrillation burden which is actually better than historical trends.  Patient presented to the ED/01/2022 via EMS for further evaluation of shortness of breath.  Patient states she was in her usual state of health until about 3 weeks ago when she developed worsening shortness of breath and a productive cough.  Patient states she has some chronic shortness of breath when ambulating with a walker but has noticed worsening shortness of breath at rest over the last few weeks.  She states she intermittently intermittently feels like she just cannot breathe.  She denies any orthopnea but does report some possible PND.  She has some chronic lower extremity edema but states it  was worse than usual about a week ago.  She denies any chest pain, palpitations, lightheadedness, dizziness, syncope.  She states she may have had a fever about a week ago but unsure.  She denies any nausea, vomiting, diarrhea.  No abnormal bleeding in urine or stools.   Upon arrival to the ED, patient markedly hypertensive BP of 194/109 and hypoxic requiring 3 L of O2 via nasal cannula.  EKG showed atrial fibrillation, rate 94 bpm, with intermittent ventricular pacing and mild ST depressions and T wave abnormalities in leads V4-V6.  BNP elevated at 1,007.  Chest x-ray showed cardiomegaly with persistent small bilateral pleural effusions, possible mild interstitial edema, and bibasilar atelectasis or consolidation. WBC 16.0, Hgb 10.4, Plts 257. Na 138, K 3.9, Glucose 125, BUN 24, Cr 1.63. Albumin 3.3 but otherwise LFTs normal.  Patient was given a dose of IV Lopressor and then started on IV Cardizem for her atrial fibrillation and was given IV Lasix for CHF and then admitted.  Echo showed LVEF of 60-65% with normal wall motion, moderate LVH, and significant prosthetic aortic valve stenosis with significant increase in mean gradient mean gradient to 29 mmHg.  Cardiology was consulted for further evaluation of CHF and aortic stenosis.  At the time of this evaluation, patient resting comfortably no acute distress.  He is no longer requiring any supplemental O2.  O2 sats in the low to mid 90s on room air.  She states she feels like her breathing is better with the IV Lasix.  Past Medical History:  Diagnosis Date   Anemia  Anxiety    CAD (coronary artery disease)    CHF (congestive heart failure) (HCC)    EF 55% on 01/05/11, trace AR w/ Bioprosthetic valve   DM2 (diabetes mellitus, type 2) (HCC)    GERD (gastroesophageal reflux disease)    HLD (hyperlipidemia)    HTN (hypertension)    PAF (paroxysmal atrial fibrillation) (HCC)    Restless legs syndrome (RLS)    Tobacco abuse     Past Surgical  History:  Procedure Laterality Date   AORTIC VALVE REPLACEMENT  08/25/2002   tissue valve   CARDIOVERSION N/A 05/05/2014   Procedure: CARDIOVERSION;  Surgeon: Dorothy Spark, MD;  Location: [facility];  Service: Cardiovascular;  Laterality: N/A;   PACEMAKER INSERTION  04/02/2012   [facility]   PERMANENT PACEMAKER GENERATOR CHANGE N/A 04/02/2012   Procedure: PERMANENT PACEMAKER GENERATOR CHANGE;  Surgeon: Sanda Klein, MD;  Location: [facility];  Service: Cardiovascular;  Laterality: N/A;   PPM GENERATOR CHANGEOUT N/A 10/11/2020   Procedure: PPM GENERATOR CHANGEOUT;  Surgeon: Sanda Klein, MD;  Location: [facility];  Service: Cardiovascular;  Laterality: N/A;     Home Medications:  Prior to Admission medications   Medication Sig Start Date End Date Taking? Authorizing Provider  ALPRAZolam Duanne Moron) 1 MG tablet Take 0.5 tablets (0.5 mg total) by mouth 2 (two) times daily as needed for anxiety. Patient taking differently: Take 1 mg by mouth at bedtime as needed for anxiety. 01/27/16  Yes Bacigalupo, Dionne Bucy, MD  apixaban (ELIQUIS) 2.5 MG TABS tablet TAKE 1 TABLET (2.5 MG TOTAL) BY MOUTH 2 (TWO) TIMES DAILY. Patient taking differently: Take 2.5 mg by mouth 2 (two) times daily. 10/12/20  Yes Croitoru, Mihai, MD  benazepril (LOTENSIN) 20 MG tablet Take 1 tablet (20 mg total) by mouth 2 (two) times daily. 01/25/21  Yes Croitoru, Mihai, MD  Cyanocobalamin (VITAMIN DEFICIENCY SYSTEM-B12 IJ) Inject 1 Dose as directed every 30 (thirty) days. Not sure about dosage   Yes [provider]  diltiazem (CARDIZEM CD) 240 MG 24 hr capsule TAKE 1 CAPSULE (240 MG TOTAL) BY MOUTH DAILY. NEED OFFICE VISIT Patient taking differently: 240 mg daily. 03/01/21  Yes Croitoru, Mihai, MD  doxycycline (VIBRAMYCIN) 100 MG capsule Take 100 mg by mouth at bedtime. 04/02/21  Yes [provider]  Evolocumab (REPATHA SURECLICK) 262 MG/ML SOAJ INJECT 140 MG INTO THE SKIN EVERY 14 (FOURTEEN) DAYS. 09/24/20  Yes  Croitoru, Mihai, MD  ferrous sulfate 325 (65 FE) MG tablet Take 1 tablet (325 mg total) by mouth daily with breakfast. 10/11/20  Yes Croitoru, Mihai, MD  fluticasone (FLONASE) 50 MCG/ACT nasal spray Place 1 spray into both nostrils daily as needed for allergies. 10/11/20  Yes Croitoru, Mihai, MD  furosemide (LASIX) 20 MG tablet Take 20 mg by mouth daily.   Yes [provider]  linaclotide (LINZESS) 145 MCG CAPS capsule Take 145 mcg by mouth daily as needed (constipation).   Yes [provider]  metFORMIN (GLUCOPHAGE) 500 MG tablet TAKE 1 TABLET (500 MG TOTAL) BY MOUTH 2 (TWO) TIMES DAILY WITH A MEAL. 06/25/17  Yes Verner Mould, MD  metoprolol tartrate (LOPRESSOR) 100 MG tablet Take 1 tablet (100 mg total) by mouth 2 (two) times daily. 11/14/16  Yes Verner Mould, MD  omeprazole (PRILOSEC) 20 MG capsule TAKE 1 CAPSULE BY MOUTH EVERY DAY Patient taking differently: 20 mg daily. 07/16/17  Yes Verner Mould, MD  Blood Glucose Monitoring Suppl (ONE TOUCH ULTRA 2) W/DEVICE KIT  12/03/13  [provider]  ezetimibe (ZETIA) 10 MG tablet TAKE 1 TABLET (10 MG TOTAL) BY MOUTH DAILY. Patient not taking: Reported on 10/08/2020 11/05/18   Croitoru, Dani Gobble, MD  ONE TOUCH ULTRA TEST test strip TEST BLOOD SUGAR TWICE A DAY 12/10/14   Mariel Aloe, MD    Inpatient Medications: Scheduled Meds:  apixaban  2.5 mg Oral BID   diltiazem  240 mg Oral Daily   ferrous sulfate  325 mg Oral Q breakfast   furosemide  40 mg Intravenous BID   mouth rinse  15 mL Mouth Rinse BID   metoprolol tartrate  100 mg Oral BID   pantoprazole  40 mg Oral Daily   sodium chloride flush  3 mL Intravenous Q12H   Continuous Infusions:  sodium chloride     PRN Meds: sodium chloride, acetaminophen, ALPRAZolam, linaclotide, ondansetron (ZOFRAN) IV, sodium chloride flush  Allergies:    Allergies  Allergen Reactions   Escitalopram     Insomnia / paranoid    Pravachol  [Pravastatin Sodium] Other (See Comments)    myalgias   Tape Other (See Comments)    Patient states that she has skin cancer and that it is thin (PLEASE USE COBAN WRAP INSTEAD OF TAPE!)    Social History:   Social History   Socioeconomic History   Marital status: Widowed    Spouse name: Not on file   Number of children: 5   Years of education: 5   Highest education level: Not on file  Occupational History   Occupation: Retired-Farmer    Employer: RETIRED  Tobacco Use   Smoking status: Never   Smokeless tobacco: Current    Types: Snuff  Substance and Sexual Activity   Alcohol use: No    Alcohol/week: 0.0 standard drinks   Drug use: No   Sexual activity: Not on file  Other Topics Concern   Not on file  Social History Narrative   Health Care POA:    Emergency Contact: daughter, [name] [phone]   End of Life Plan: gave pt ad pamphlet   Who lives with you: daughter and 2 grandchildren   Any pets: 2 dogs   Diet: pt has a varied diet of protein, starch and vegetables.   Exercise: pt garden's daily   Seatbelts: pt does not wear seatbelt because of pace maker   Sun Exposure/Protection: pt does not use sun protection, has irregular dermatology appointments.   Hobbies: gardening, sitting on porch.         Social Determinants of Health   Financial Resource Strain: Not on file  Food Insecurity: Not on file  Transportation Needs: Not on file  Physical Activity: Not on file  Stress: Not on file  Social Connections: Not on file  Intimate Partner Violence: Not on file    Family History:    Family History  Problem Relation Age of Onset   Heart attack Brother    Kidney disease Sister      ROS:  Please see the history of present illness.  Review of Systems  Constitutional:  Negative for fever.  HENT:  Positive for congestion.   Respiratory:  Positive for cough, sputum production and shortness of breath. Negative for hemoptysis.   Cardiovascular:  Positive for leg  swelling and PND. Negative for chest pain, palpitations and orthopnea.  Gastrointestinal:  Negative for blood in stool and melena.  Genitourinary:  Negative for hematuria.  Musculoskeletal:  Negative for myalgias.  Skin:        Dry/flaky  skin  Neurological:  Negative for dizziness and loss of consciousness.  Endo/Heme/Allergies:  Does not bruise/bleed easily.  Psychiatric/Behavioral:  Positive for substance abuse (history of tobacco abuse).     Physical Exam/Data:   Vitals:   04/12/21 2027 04/13/21 0932 04/13/21 1110 04/13/21 1517  BP: (!) 172/82 (!) 141/99 (!) 152/80   Pulse: 94 (!) 106 (!) 107   Resp: _0 Temp: 99.3 F (37.4 C) 98.7 F (37.1 C) 97.8 F (36.6 C)   TempSrc: Oral Oral Oral   SpO2: 92%  95% 94%  Weight:      Height:        Intake/Output Summary (Last 24 hours) at 04/13/2021 1641 Last data filed at 04/13/2021 1228 Gross per 24 hour  Intake 118 ml  Output 300 ml  Net -182 ml   Last 3 Weights 04/12/2021 04/11/2021 04/10/2021  Weight (lbs) 139 lb 6.4 oz 140 lb 10.5 oz 147 lb 11.3 oz  Weight (kg) 63.231 kg 63.8 kg 67 kg     Body mass index is 23.93 kg/m.  General: 88 y.o. frail Caucasian female resting comfortably in no acute distress. Hard of hearing. HEENT: Normocephalic and atraumatic. Sclera clear.  Neck: Supple. No JVD. Heart: Irregularly irregular rhythm with normal rate. III/VI systolic murmur.  Radial pulses 2+ and equal bilaterally. Lungs: No increased work of breathing. Crackles noted in bilateral bases. Abdomen: Soft, non-distended, and non-tender to palpation. Bowel sounds present. Extremities: Trace lower extremity edema bilaterally.    Skin: Warm and dry. Neuro: Alert and oriented x3. No focal deficits. Psych: Normal affect. Responds appropriately.  EKG:  The EKG was personally reviewed and demonstrates: Atrial fibrillation, rate 94 bpm, with intermittent ventricular pacing and mild ST depressions and T wave abnormalities in leads V4-V6.  Left axis deviation. QTc 459 ms.  Telemetry:  Telemetry was personally reviewed and demonstrates:  Atrial fibrillation with rates in the 90s to low 100s. Intermittent ventricular pacing.  Relevant CV Studies:  Echocardiogram 04/11/2021: Impressions:  1. Left ventricular ejection fraction, by estimation, is 60 to 65%. The  left ventricle has normal function. The left ventricle has no regional  wall motion abnormalities. There is moderate left ventricular hypertrophy.  Left ventricular diastolic  parameters are indeterminate.   2. Right ventricular systolic function was not well visualized. The right  ventricular size is not well visualized. There is normal pulmonary artery  systolic pressure. The estimated right ventricular systolic pressure is  82.9 mmHg.   3. The mitral valve is degenerative. Trivial mitral valve regurgitation.  No evidence of mitral stenosis.   4. There is a bioprosthetic valve present in the aortic position.      Aortic valve regurgitation is trivial. Vmax 3.4 m/s, MG 29 mmHg, EOA  0.5 cm^2, DI 0.2. Findings suggest significant prosthetic valve stenosis,  gradient has significantly increased from prior echo 08/2020 (15 > 29 mmHg)   5. The inferior vena cava is normal in size with greater than 50%  respiratory variability, suggesting right atrial pressure of 3 mmHg.   6. Left atrial size was mildly dilated.   Laboratory Data:  High Sensitivity Troponin:  No results for input(s): TROPONINIHS in the last 720 hours.   Chemistry Recent Labs  Lab 04/10/21 1642 04/11/21 0905 04/12/21 0122 04/13/21 0427  NA  --  139 139 139  K  --  4.1 3.8 3.9  CL  --  102 101 102  CO2  --  _1 GLUCOSE  --  95 97 97  BUN  --  24* 28* 24*  CREATININE  --  1.81* 2.09* 1.85*  CALCIUM  --  8.2* 8.3* 8.7*  MG 1.3*  --  1.6*  --   GFRNONAA  --  26* 22* 26*  ANIONGAP  --  _0 Recent Labs  Lab 04/10/21 1555  PROT 7.3  ALBUMIN 3.3*  AST 20  ALT 12  ALKPHOS 57   BILITOT 0.6   Lipids No results for input(s): CHOL, TRIG, HDL, LABVLDL, LDLCALC, CHOLHDL in the last 168 hours.  Hematology Recent Labs  Lab 04/10/21 1555  WBC 16.0*  RBC 4.15  HGB 10.4*  HCT 33.8*  MCV 81.4  MCH 25.1*  MCHC 30.8  RDW 17.0*  PLT 257   Thyroid No results for input(s): TSH, FREET4 in the last 168 hours.  BNP Recent Labs  Lab 04/10/21 1555  BNP 1,007.3*    DDimer No results for input(s): DDIMER in the last 168 hours.   Radiology/Studies:  DG Chest Portable 1 View  Result Date: 04/10/2021 CLINICAL DATA:  Shortness of breath.  Concern for pneumonia. EXAM: PORTABLE CHEST 1 VIEW COMPARISON:  03/29/2021 FINDINGS: A pacemaker remains in place with leads terminating over the right atrium and right ventricle. There is unchanged enlargement of the cardiac silhouette status post aortic valve replacement. Aortic atherosclerosis is noted. There are persistent small bilateral pleural effusions with bibasilar airspace opacities and mild accentuation of the interstitial markings. No pneumothorax is identified. No acute osseous abnormality is seen. IMPRESSION: Cardiomegaly with persistent small bilateral pleural effusions, possible mild interstitial edema, and bibasilar atelectasis or consolidation. Electronically Signed   By: Logan Bores M.D.   On: 04/10/2021 16:30   ECHOCARDIOGRAM COMPLETE  Result Date: 04/11/2021    ECHOCARDIOGRAM REPORT   Patient Name:   LALISA KIEHN Date of Exam: 04/11/2021 Medical Rec #:  [id number]          Height:       64.0 in Accession #:    [id number]         Weight:       140.7 lb Date of Birth:  28-Jul-1929          BSA:          1.685 m Patient Age:    60 years           BP:           134/63 mmHg Patient Gender: F                  HR:           79 bpm. Exam Location:  Inpatient Procedure: 2D Echo, Color Doppler and Cardiac Doppler Indications:     E49.75 Acute systolic (congestive) heart failure  History:         Patient has prior history of  Echocardiogram examinations, most                  recent 08/27/2020. CHF, CAD, Pacemaker, Arrythmias:Atrial                  Fibrillation; Risk Factors:Hypertension, Diabetes and                  Dyslipidemia. ?55m Bioprosthetic Aortic Valve implanted                  08/21/2002.  Sonographer:     ERaquel SarnaSenior RDCS Referring Phys:  [id number]  TU Diagnosing Phys: Oswaldo Milian MD  Sonographer Comments: Technically difficult due to thin body habitus. IMPRESSIONS  1. Left ventricular ejection fraction, by estimation, is 60 to 65%. The left ventricle has normal function. The left ventricle has no regional wall motion abnormalities. There is moderate left ventricular hypertrophy. Left ventricular diastolic parameters are indeterminate.  2. Right ventricular systolic function was not well visualized. The right ventricular size is not well visualized. There is normal pulmonary artery systolic pressure. The estimated right ventricular systolic pressure is 81.8 mmHg.  3. The mitral valve is degenerative. Trivial mitral valve regurgitation. No evidence of mitral stenosis.  4. There is a bioprosthetic valve present in the aortic position.     Aortic valve regurgitation is trivial. Vmax 3.4 m/s, MG 29 mmHg, EOA 0.5 cm^2, DI 0.2. Findings suggest significant prosthetic valve stenosis, gradient has significantly increased from prior echo 08/2020 (15 > 29 mmHg)  5. The inferior vena cava is normal in size with greater than 50% respiratory variability, suggesting right atrial pressure of 3 mmHg.  6. Left atrial size was mildly dilated. FINDINGS  Left Ventricle: Left ventricular ejection fraction, by estimation, is 60 to 65%. The left ventricle has normal function. The left ventricle has no regional wall motion abnormalities. The left ventricular internal cavity size was normal in size. There is  moderate left ventricular hypertrophy. Left ventricular diastolic parameters are indeterminate. Right Ventricle: The right  ventricular size is not well visualized. Right vetricular wall thickness was not well visualized. Right ventricular systolic function was not well visualized. There is normal pulmonary artery systolic pressure. The tricuspid regurgitant velocity is 2.37 m/s, and with an assumed right atrial pressure of 3 mmHg, the estimated right ventricular systolic pressure is 29.9 mmHg. Left Atrium: Left atrial size was mildly dilated. Right Atrium: Right atrial size was normal in size. Pericardium: There is no evidence of pericardial effusion. Mitral Valve: The mitral valve is degenerative in appearance. Mild to moderate mitral annular calcification. Trivial mitral valve regurgitation. No evidence of mitral valve stenosis. Tricuspid Valve: The tricuspid valve is normal in structure. Tricuspid valve regurgitation is trivial. Aortic Valve: The aortic valve has been repaired/replaced. Aortic valve regurgitation is trivial. Aortic valve mean gradient measures 28.8 mmHg. Aortic valve peak gradient measures 42.8 mmHg. Aortic valve area, by VTI measures 0.48 cm. There is a bioprosthetic valve present in the aortic position. Pulmonic Valve: The pulmonic valve was not well visualized. Pulmonic valve regurgitation is not visualized. Aorta: The aortic root and ascending aorta are structurally normal, with no evidence of dilitation. Venous: The inferior vena cava is normal in size with greater than 50% respiratory variability, suggesting right atrial pressure of 3 mmHg. IAS/Shunts: The interatrial septum was not well visualized.  LEFT VENTRICLE PLAX 2D LVIDd:         4.20 cm LVIDs:         2.70 cm LV PW:         1.40 cm LV IVS:        1.30 cm LVOT diam:     1.80 cm LV SV:         43 LV SV Index:   25 LVOT Area:     2.54 cm  RIGHT VENTRICLE RV S prime:     8.78 cm/s TAPSE (M-mode): 1.8 cm LEFT ATRIUM             Index        RIGHT ATRIUM  Index LA diam:        3.60 cm 2.14 cm/m   RA Area:     17.00 cm LA Vol (A2C):   62.3 ml  36.98 ml/m  RA Volume:   44.70 ml  26.54 ml/m LA Vol (A4C):   57.5 ml 34.13 ml/m LA Biplane Vol: 61.8 ml 36.69 ml/m  AORTIC VALVE AV Area (Vmax):    0.56 cm AV Area (Vmean):   0.49 cm AV Area (VTI):     0.48 cm AV Vmax:           326.93 cm/s AV Vmean:          258.245 cm/s AV VTI:            0.886 m AV Peak Grad:      42.8 mmHg AV Mean Grad:      28.8 mmHg LVOT Vmax:         72.10 cm/s LVOT Vmean:        50.000 cm/s LVOT VTI:          0.168 m LVOT/AV VTI ratio: 0.19  AORTA Ao Root diam: 2.30 cm Ao Asc diam:  2.90 cm TRICUSPID VALVE TR Peak grad:   22.5 mmHg TR Vmax:        237.00 cm/s  SHUNTS Systemic VTI:  0.17 m Systemic Diam: 1.80 cm Oswaldo Milian MD Electronically signed by Oswaldo Milian MD Signature Date/Time: 04/11/2021/1:45:12 PM    Final (Updated)      Assessment and Plan:   Acute Onset Diastolic CHF Patient presented with worsening shortness of breath and productive cough x3 weeks. BNP elevated at 1,007. Chest x-ray showed cardiomegaly with persistent small bilateral pleural effusions, possible mild interstitial edema, and bibasilar atelectasis or consolidation. Echo this admission showed  LVEF of 60-65% with normal wall motion, moderate LVH, and significant prosthetic aortic valve stenosis with significant increase in mean gradient mean gradient to 29 mmHg. She was started on IV Lasix with good urinary output. Net negative 2.3 L this admission. Renal function initially worsened with diuresis but has now stabilized. - She still has mild crackles on exam but no significant peripheral edema. - Continue IV Lasix 62m twice daily. - Home Benazepril held due to renal function. - Continue home beta-blocker. - Continue to monitor daily weights, strict I/Os, and renal function.  Paroxysmal Atrial Fibrillation Sinus Node Dysfunction s/ PPM Last device check in 12/2020 showed 83% atrial pacing and 4.6% ventricular pacing with 3.5% atrial fibrillation burden. Presented in rate  controlled atrial fibrillation. Potassium 3.9 today. Magnesium 1.3 on admission - she was supplemented and 1.6 yesterday so was supplemented more.  She was initially started on IV Cardizem but this has been weaned off. - Still in atrial fibrillation at this time with rates in the 90s to low 100s. - Will recheck Magnesium. - Will check TSH. - Continue home Lopressor 1024mtwice daily.  - Will increase home Cardizem CD to 30058maily.  - Continue chronic anticoagulation with Eliquis 2.5mg53mice daily. Reduced dose due to age and weight.  Non-Obstructive CAD Noted on cardiac catheterization in 2004. - No angina. - No Aspirin due to need for DOAC. - Unable to tolerate multiple statins in the past. Continue Zetia 10mg59mly. Also on Repatha at home.  Aortic Stenosis s/p AVR in 2005 Echo this admission showed significant prosthetic aortic valve stenosis with significant increase in mean gradient mean gradient to 29 mmHg (was 13.5 mmHg in 08/2020).  - Given advanced aged and  frailty, suspect we will treat conservatively and just monitor.  Hypertension BP mildly elevated. - Continue home Lopressor 12m twice daily. - Will increase Cardizem CD to 3029mdaily. - Home Benazepril on hold due to renal function.  Hyperlipidemia Lipid panel in 04/2020: Total Cholesterol 225, Triglycerides 171, HDL 61, LDL 134.  - Unable to tolerate multiple statins in the past. Continue Zetia 1032maily. Also on Repatha at home.  - Felt to likely have heterozygous familial hypercholesterolemia given LDL still above goal.  Type 2 Diabetes Mellitus Hemoglobin A1c 5.9 this admission. - Management per primary team.  Acute on CKD Stage III Creatinine 1.63 on admission and peaked at 2.09 on 2/14. Baseline around 1.5 to 1.6. - Creatinine stable at 1.85 today. - Continue to monitor closely with diuresis.   Risk Assessment/Risk Scores:    New York Heart Association (NYHA) Functional Class NYHA Class  III  CHA2DS2-VASc Score = 7  This indicates a 11.2% annual risk of stroke. The patient's score is based upon: CHF History: 1 HTN History: 1 Diabetes History: 1 Stroke History: 0 Vascular Disease History: 1 Age Score: 2 Gender Score: 1    For questions or updates, please contact [facility] consult [email or web address] for contact info under    Signed, CalDarreld McleanA-C  04/13/2021 4:41 PM  Patient seen and examined, note reviewed with the signed Advanced Practice Provider. I personally reviewed laboratory data, imaging studies and relevant notes. I independently examined the patient and formulated the important aspects of the plan. I have personally discussed the plan with the patient and/or family. Comments or changes to the note/plan are indicated below.  \Agree with diuretics at this time, we will continue to monitor.  Strict I's and O's. Her ventricular rate is not well controlled increase her Cardizem.  Continue Lopressor 100 mg twice a day. Continue with her anticoagulation. No angina symptoms. With increasing her Cardizem hopefully that will help with her blood pressure as well. AKI has improved significantly we will continue to monitor creatinine on current dose of diuretics.  We will continue to follow patient.  KarBerniece Salines, MS FACClearwater Valley Hospital And Clinicstending Cardiologist [address] Garden[place] [phone]: [email or web address]

## 2021-04-13 NOTE — Progress Notes (Signed)
Pt refusing to keep tele monitoring on. Pt educated and placed on standby.

## 2021-04-14 DIAGNOSIS — I350 Nonrheumatic aortic (valve) stenosis: Secondary | ICD-10-CM

## 2021-04-14 DIAGNOSIS — I5031 Acute diastolic (congestive) heart failure: Secondary | ICD-10-CM

## 2021-04-14 LAB — CBC
HCT: 33.3 % — ABNORMAL LOW (ref 36.0–46.0)
Hemoglobin: 10.3 g/dL — ABNORMAL LOW (ref 12.0–15.0)
MCH: 24.7 pg — ABNORMAL LOW (ref 26.0–34.0)
MCHC: 30.9 g/dL (ref 30.0–36.0)
MCV: 79.9 fL — ABNORMAL LOW (ref 80.0–100.0)
Platelets: 220 10*3/uL (ref 150–400)
RBC: 4.17 MIL/uL (ref 3.87–5.11)
RDW: 17 % — ABNORMAL HIGH (ref 11.5–15.5)
WBC: 10.2 10*3/uL (ref 4.0–10.5)
nRBC: 0 % (ref 0.0–0.2)

## 2021-04-14 LAB — TSH: TSH: 2.959 u[IU]/mL (ref 0.350–4.500)

## 2021-04-14 LAB — BASIC METABOLIC PANEL
Anion gap: 13 (ref 5–15)
BUN: 25 mg/dL — ABNORMAL HIGH (ref 8–23)
CO2: 27 mmol/L (ref 22–32)
Calcium: 8.8 mg/dL — ABNORMAL LOW (ref 8.9–10.3)
Chloride: 97 mmol/L — ABNORMAL LOW (ref 98–111)
Creatinine, Ser: 1.91 mg/dL — ABNORMAL HIGH (ref 0.44–1.00)
GFR, Estimated: 25 mL/min — ABNORMAL LOW (ref >60)
Glucose, Bld: 114 mg/dL — ABNORMAL HIGH (ref 70–99)
Potassium: 3.8 mmol/L (ref 3.5–5.1)
Sodium: 137 mmol/L (ref 135–145)

## 2021-04-14 LAB — MAGNESIUM: Magnesium: 1.9 mg/dL (ref 1.7–2.4)

## 2021-04-14 MED ORDER — FUROSEMIDE 40 MG PO TABS
40.0000 mg | ORAL_TABLET | Freq: Every day | ORAL | 0 refills | Status: DC
Start: 2021-04-14 — End: 2021-04-15

## 2021-04-14 MED ORDER — POTASSIUM CHLORIDE CRYS ER 20 MEQ PO TBCR
40.0000 meq | EXTENDED_RELEASE_TABLET | Freq: Once | ORAL | Status: AC
  Administered 2021-04-14: 40 meq via ORAL
  Filled 2021-04-14: qty 2

## 2021-04-14 MED ORDER — FUROSEMIDE 40 MG PO TABS: 40.0000 mg | ORAL_TABLET | Freq: Every day | ORAL | Status: DC

## 2021-04-14 MED ORDER — POTASSIUM CHLORIDE CRYS ER 20 MEQ PO TBCR: 40.0000 meq | EXTENDED_RELEASE_TABLET | Freq: Every day | ORAL | 0 refills | Status: DC

## 2021-04-14 MED ORDER — FUROSEMIDE 10 MG/ML IJ SOLN: 40.0000 mg | Freq: Two times a day (BID) | INTRAMUSCULAR | Status: DC

## 2021-04-14 NOTE — Progress Notes (Signed)
Patient refusing to take morning medication this morning. Was only able to obtain blood pressure. Patient told RN " Get lost". Will attempt gain later.

## 2021-04-14 NOTE — Care Management Important Message (Signed)
Important Message  Patient Details  Name: [name] MRN: [id number] Date of Birth: 03/08/1929   Medicare Important Message Given:  Yes     Shelda Altes 04/14/2021, 9:21 AM

## 2021-04-14 NOTE — Progress Notes (Addendum)
1004:RN rounded on patient. [name] had taken out her IV and requesting to get up out the bed. PT at the bedside. Spoke with daughter [name] and informed her that her mother was agitated this morning. Per daughter, her son will be coming to sit with her.  1025: PT and RN noted patient had something black in her mouth. When asked patient what it was, she stated "Snuff!". RN looked into patient's purse, nothing found. However, a small container filled with a brown substance was found. The lid of the container was labeled "Quality Snuff". MD notified.   1030: Family at bedside (grandchildren) Container given to family and informed patient cannot have this at bedside.They verbalized understanding.

## 2021-04-14 NOTE — Progress Notes (Signed)
SATURATION QUALIFICATIONS: (This note is used to comply with regulatory documentation for home oxygen)  Patient Saturations on Room Air at Rest = 93%  Patient Saturations on Room Air while Ambulating = 82%  Patient Saturations on 3 Liters of oxygen while Ambulating = 92%  Please briefly explain why patient needs home oxygen:  Patient meets the requirement for home oxygen needs. Patient needed 3L of oxygen to maintain 88% oxygen saturation during activity.

## 2021-04-14 NOTE — Progress Notes (Signed)
Physical Therapy Treatment Patient Details Name: [name] MRN: [id number] DOB: [birth year] Today's Date: 04/14/2021   History of Present Illness 88 yo female admitted 2/21  presenting with dyspnea and concern acute heart failure with decompensation. PMH sick sinus syndrome s/p pace maker, atrial fibrillation and flutter, s/p tissue aortic valve replacement, HTN, dyslipidemia, CAD, CKD stage 3b    PT Comments    The pt was agitated and asking RN and NT to let her out of bed upon my arrival. Once seated EOB however, the pt declined ambulation in the room, but did agree to x3 sit-stand from EOB. She needed only minA to steady and manage RW, but uses poor technique, only pulling on RW to stand. The pt declined to wear O2 or pulse ox sensor, so unable to update O2 needs at this time. Will continue to follow and attempt to progress mobility and further assess for O2 needs with mobility as able.    Recommendations for follow up therapy are one component of a multi-disciplinary discharge planning process, led by the attending physician.  Recommendations may be updated based on patient status, additional functional criteria and insurance authorization.  Follow Up Recommendations  Home health PT     Assistance Recommended at Discharge Set up Supervision/Assistance  Patient can return home with the following A little help with walking and/or transfers;A little help with bathing/dressing/bathroom;Assistance with cooking/housework;Direct supervision/assist for medications management;Direct supervision/assist for financial management   Equipment Recommendations  None recommended by PT (pt wants shower chair but may want HH to determine the kind)    Recommendations for Other Services       Precautions / Restrictions Precautions Precautions: Fall Precaution Comments: pt with increased agitation, making verbal threats this morning Restrictions Weight Bearing Restrictions: No     Mobility   Bed Mobility Overal bed mobility: Needs Assistance Bed Mobility: Supine to Sit, Sit to Supine     Supine to sit: Supervision Sit to supine: Supervision   General bed mobility comments: supervision for safety pt long sitting inbed upon my arrival    Transfers Overall transfer level: Needs assistance Equipment used: Rolling walker (2 wheels) Transfers: Sit to/from Stand Sit to Stand: Min guard           General transfer comment: minG for safety, pt not following cues for hand placement and not redirectable at this time. stood x2, otherwise not agreeable to get OOB despite telling staff she wants to get OOB    Ambulation/Gait Ambulation/Gait assistance: Min assist Gait Distance (Feet): 5 Feet Assistive device: Rolling walker (2 wheels) Gait Pattern/deviations: Step-through pattern, Decreased stride length, Trunk flexed, Drifts right/left       General Gait Details: minA to steady and steer RW. pt self-limited to short bout of walking in room. asking to get out of bed but declining OOB mobility or ambulation when offered       Balance Overall balance assessment: Mild deficits observed, not formally tested, Needs assistance Sitting-balance support: No upper extremity supported, Feet supported Sitting balance-Leahy Scale: Fair     Standing balance support: Bilateral upper extremity supported, During functional activity Standing balance-Leahy Scale: Poor Standing balance comment: relies on UE support on RW                            Cognition Arousal/Alertness: Awake/alert Behavior During Therapy: Agitated Overall Cognitive Status: History of cognitive impairments - at baseline  General Comments: pt with acute confusion in addition to baseline. Pt stating "you get out of here, stop trying to mess with my mind" patient verbally threatening staff, demanding to get out of bed        Exercises      General  Comments General comments (skin integrity, edema, etc.): HR stable on RA, pt declined pulse ox and O2, 0/4 DOE      Pertinent Vitals/Pain Pain Assessment Pain Assessment: No/denies pain     PT Goals (current goals can now be found in the care plan section) Acute Rehab PT Goals Patient Stated Goal: to go home PT Goal Formulation: With patient Time For Goal Achievement: 04/26/21 Potential to Achieve Goals: Good Progress towards PT goals: Progressing toward goals    Frequency    Min 3X/week      PT Plan Current plan remains appropriate       AM-PAC PT "6 Clicks" Mobility   Outcome Measure  Help needed turning from your back to your side while in a flat bed without using bedrails?: None Help needed moving from lying on your back to sitting on the side of a flat bed without using bedrails?: A Little Help needed moving to and from a bed to a chair (including a wheelchair)?: A Little Help needed standing up from a chair using your arms (e.g., wheelchair or bedside chair)?: A Little Help needed to walk in hospital room?: A Little Help needed climbing 3-5 steps with a railing? : A Lot 6 Click Score: 18    End of Session Equipment Utilized During Treatment:  (pt declined use of gait belt, refused to wear O2) Activity Tolerance: Treatment limited secondary to agitation Patient left: in bed;with call bell/phone within reach;with bed alarm set Nurse Communication: Mobility status PT Visit Diagnosis: Muscle weakness (generalized) (M62.81)     Time: 0131-4388 PT Time Calculation (min) (ACUTE ONLY): 26 min  Charges:  $Therapeutic Exercise: 8-22 mins $Therapeutic Activity: 8-22 mins                     West Carbo, PT, DPT   Acute Rehabilitation Department Pager #: [phone]   Sandra Cockayne 04/14/2021, 12:42 PM

## 2021-04-14 NOTE — Progress Notes (Signed)
Patient complaints about dizziness post walking ambulation with RN and NT. Family at bedside concern about discharge. Family states :we are uncomfortable bringing her home with her being dizzy". RN notified MD and MD made aware. MD to cancel d/c and will reassess tomorrow.

## 2021-04-14 NOTE — Progress Notes (Signed)
1327: Informed by telemetry patient heart rate was up to 140 bpm. Heart rate fluctuating on the monitor (110-124). Will notify cardiology.

## 2021-04-14 NOTE — TOC Transition Note (Addendum)
Transition of Care [facility]) - CM/SW Discharge Note   Patient Details  Name: [name] MRN: [id number] Date of Birth: 1929-12-17  [facility] CM/SW Contact:  Zenon Mayo, RN Phone Number: 04/14/2021, 4:59 PM   Clinical Narrative:    Patient is for dc, NCM notified Zach with [employer]  and notified Anderson Malta with [facility]. Oxygen will be brought up to room prior to dc. Patient also wants shower chair and walker, per Nurse they are in a hurry to leave so will have DME to be delivered to the home.    Final next level of care: [place] Barriers to Discharge: Continued Medical Work up   Patient Goals and CMS Choice Patient states their goals for this hospitalization and ongoing recovery are:: return home   Choice offered to / list presented to : Adult Children  Discharge Placement                       Discharge Plan and Services                DME Arranged: Oxygen, Shower stool, walker  rolling  DME Agency: [employer] Date DME Agency Contacted: 04/13/21 Time DME Agency Contacted: 46 Representative spoke with at DME Agency: Adela Lank HH Arranged: PT, OTST [place] Agency: [facility] Date [place]: 04/13/21 Time [place]: 1134 Representative spoke with at [place]: [place] ([place]) Interventions     Readmission Risk Interventions No flowsheet data found.

## 2021-04-14 NOTE — Progress Notes (Signed)
PROGRESS NOTE    [name]  LTJ:[id number] DOB: [birth year] DOA: 04/10/2021 PCP: Imagene Riches, NP  Brief Narrative:89/F with history of chronic diastolic CHF, SSS status post PPM, permanent A-fib/a flutter on Eliquis, history of bioprosthetic, AVR, CAD, hypertension, CKD 3B presented to the ED with increasing shortness of breath X 2 weeks. -In the ED she was hypoxic, 80s requiring 3 L [place], blood pressure 160s/113, chest x-ray noted bilateral pleural effusion, interstitial edema, BNP> 1000, creatinine was 1.6  -Echo noted preserved EF, concern for significant stenosis of bioprosthetic aortic valve  Subjective: -A little irritable this morning, wants to be left alone, breathing okay overall  Assessment and Plan: * Acute CHF (congestive heart failure) (HCC) Acute diastolic CHF (congestive heart failure) (HCC) Aortic stenosis Echocardiogram with preserved LV systolic function EF 60 to 65%, mod LVH, bioprosthetic aortic valve with concern for significant stenosis  -Diuresed with IV Lasix, she is 3.5 L negative -Appreciate cardiology input, plan for conservative management of aortic stenosis -Transition to to p.o. Lasix today -Ambulate, PT eval, discharge planning  Persistent atrial fibrillation ([place])- (present on admission) Persistent atrial fibrillation with RVR -s/p pacer for SSS -Heart rate improving, continue metoprolol 100 mg twice daily and diltiazem 240 -Continue apixaban  Iron deficiency anemia- (present on admission) - Stable, continue iron supplementation  Acute kidney injury superimposed on chronic kidney disease ([place])- (present on admission) CKD stage 3b, hypokalemia Baseline creatinine around 1.6, creatinine trended up to 2.0 -Creatinine stable now  Essential hypertension, benign- (present on admission) -Stable continue with metoprolol and diltiazem Transition to oral furosemide  Type 2 diabetes mellitus with hyperlipidemia ([place])- (present on admission) -  CBGs in normal range, and insulin discontinued   DVT prophylaxis: Apixaban Code Status: DNR Family Communication: No family at bedside, updated daughter yesterday Disposition Plan: Home in 1 to 2 days  Consultants:  Cardiology  Procedures:   Antimicrobials:    Objective: Vitals:   04/13/21 2128 04/14/21 0533 04/14/21 0903 04/14/21 0905  BP: (!) 155/98 (!) 147/79 (!) 143/98   Pulse: (!) 117 78    Resp:  18  18  Temp:  97.8 F (36.6 C)    TempSrc:  Oral    SpO2:  94%    Weight:  61.2 kg    Height:        Intake/Output Summary (Last 24 hours) at 04/14/2021 1340 Last data filed at 04/14/2021 1009 Gross per 24 hour  Intake 236 ml  Output 1400 ml  Net -1164 ml   Filed Weights   04/11/21 0942 04/12/21 0447 04/14/21 0533  Weight: 63.8 kg 63.2 kg 61.2 kg    Examination:  General exam: Pleasant elderly female sitting up in bed, somnolent but easily arousable, but confused CVS: S1-S2, regular rhythm, systolic murmur Lungs: Decreased breath sounds to bases Abdomen: Soft, nontender, bowel sounds present Extremities: No edema Skin: No rashes Psychiatry: Poor insight and judgment    Data Reviewed:   CBC: Recent Labs  Lab 04/10/21 1555 04/14/21 0618  WBC 16.0* 10.2  NEUTROABS 12.2*  --   HGB 10.4* 10.3*  HCT 33.8* 33.3*  MCV 81.4 79.9*  PLT 257 076   Basic Metabolic Panel: Recent Labs  Lab 04/10/21 1555 04/10/21 1642 04/11/21 0905 04/12/21 0122 04/13/21 0427 04/14/21 0618  NA 138  --  139 139 139 137  K 3.9  --  4.1 3.8 3.9 3.8  CL 101  --  102 101 102 97*  CO2 23  --  23 25  24 27  GLUCOSE 125*  --  95 97 97 114*  BUN 24*  --  24* 28* 24* 25*  CREATININE 1.63*  --  1.81* 2.09* 1.85* 1.91*  CALCIUM 8.4*  --  8.2* 8.3* 8.7* 8.8*  MG  --  1.3*  --  1.6*  --  1.9   GFR: Estimated Creatinine Clearance: 17.2 mL/min (A) (by C-G formula based on SCr of 1.91 mg/dL (H)). Liver Function Tests: Recent Labs  Lab 04/10/21 1555  AST 20  ALT 12  ALKPHOS 57   BILITOT 0.6  PROT 7.3  ALBUMIN 3.3*   No results for input(s): LIPASE, AMYLASE in the last 168 hours. No results for input(s): AMMONIA in the last 168 hours. Coagulation Profile: No results for input(s): INR, PROTIME in the last 168 hours. Cardiac Enzymes: No results for input(s): CKTOTAL, CKMB, CKMBINDEX, TROPONINI in the last 168 hours. BNP (last 3 results) No results for input(s): PROBNP in the last 8760 hours. HbA1C: No results for input(s): HGBA1C in the last 72 hours. CBG: Recent Labs  Lab 04/11/21 0917 04/11/21 1548  GLUCAP 92 104*   Lipid Profile: No results for input(s): CHOL, HDL, LDLCALC, TRIG, CHOLHDL, LDLDIRECT in the last 72 hours. Thyroid Function Tests: Recent Labs    04/14/21 0618  TSH 2.959   Anemia Panel: No results for input(s): VITAMINB12, FOLATE, FERRITIN, TIBC, IRON, RETICCTPCT in the last 72 hours. Urine analysis:    Component Value Date/Time   COLORURINE COLORLESS (A) 04/10/2021 2042   APPEARANCEUR CLEAR 04/10/2021 2042   LABSPEC 1.005 04/10/2021 2042   PHURINE 5.0 04/10/2021 2042   GLUCOSEU NEGATIVE 04/10/2021 2042   HGBUR SMALL (A) 04/10/2021 2042   BILIRUBINUR NEGATIVE 04/10/2021 2042   BILIRUBINUR NEG 09/02/2014 1110   KETONESUR NEGATIVE 04/10/2021 2042   PROTEINUR 100 (A) 04/10/2021 2042   UROBILINOGEN 2.0 09/02/2014 1110   UROBILINOGEN 0.2 11/13/2009 1539   NITRITE NEGATIVE 04/10/2021 2042   LEUKOCYTESUR TRACE (A) 04/10/2021 2042   Sepsis Labs: @LABRCNTIP (procalcitonin:4,lacticidven:4)  ) Recent Results (from the past 240 hour(s))  Resp Panel by RT-PCR (Flu A&B, Covid) Nasopharyngeal Swab     Status: None   Collection Time: 04/10/21  4:10 PM   Specimen: Nasopharyngeal Swab; Nasopharyngeal(NP) swabs in vial transport medium  Result Value Ref Range Status   SARS Coronavirus 2 by RT PCR NEGATIVE NEGATIVE Final    Comment: (NOTE) SARS-CoV-2 target nucleic acids are NOT DETECTED.  The SARS-CoV-2 RNA is generally detectable in  upper respiratory specimens during the acute phase of infection. The lowest concentration of SARS-CoV-2 viral copies this assay can detect is 138 copies/mL. A negative result does not preclude SARS-Cov-2 infection and should not be used as the sole basis for treatment or other patient management decisions. A negative result may occur with  improper specimen collection/handling, submission of specimen other than nasopharyngeal swab, presence of viral mutation(s) within the areas targeted by this assay, and inadequate number of viral copies(<138 copies/mL). A negative result must be combined with clinical observations, patient history, and epidemiological information. The expected result is Negative.  Fact Sheet for Patients:  EntrepreneurPulse.com.au  Fact Sheet for Healthcare Providers:  IncredibleEmployment.be  This test is no t yet approved or cleared by the [place] FDA and  has been authorized for detection and/or diagnosis of SARS-CoV-2 by FDA under an Emergency Use Authorization (EUA). This EUA will remain  in effect (meaning this test can be used) for the duration of the COVID-19 declaration under Section 564(b)(1) of  the Act, 21 U.S.C.section 360bbb-3(b)(1), unless the authorization is terminated  or revoked sooner.       Influenza A by PCR NEGATIVE NEGATIVE Final   Influenza B by PCR NEGATIVE NEGATIVE Final    Comment: (NOTE) The Xpert Xpress SARS-CoV-2/FLU/RSV plus assay is intended as an aid in the diagnosis of influenza from Nasopharyngeal swab specimens and should not be used as a sole basis for treatment. Nasal washings and aspirates are unacceptable for Xpert Xpress SARS-CoV-2/FLU/RSV testing.  Fact Sheet for Patients: EntrepreneurPulse.com.au  Fact Sheet for Healthcare Providers: IncredibleEmployment.be  This test is not yet approved or cleared by the [place] FDA and has been  authorized for detection and/or diagnosis of SARS-CoV-2 by FDA under an Emergency Use Authorization (EUA). This EUA will remain in effect (meaning this test can be used) for the duration of the COVID-19 declaration under Section 564(b)(1) of the Act, 21 U.S.C. section 360bbb-3(b)(1), unless the authorization is terminated or revoked.  Performed at [facility], [place] [address], [place], [place] [address]      Radiology Studies: No results found.   Scheduled Meds:  apixaban  2.5 mg Oral BID   diltiazem  300 mg Oral Daily   ferrous sulfate  325 mg Oral Q breakfast   furosemide  40 mg Intravenous BID   mouth rinse  15 mL Mouth Rinse BID   metoprolol tartrate  100 mg Oral BID   pantoprazole  40 mg Oral Daily   potassium chloride  40 mEq Oral Once   sodium chloride flush  3 mL Intravenous Q12H   Continuous Infusions:  sodium chloride       LOS: 4 days    Time spent: 69min  Domenic Polite, MD [facility]   04/14/2021, 1:40 PM

## 2021-04-14 NOTE — Progress Notes (Addendum)
Progress Note  Patient Name: [name] Date of Encounter: 04/14/2021  Primary Cardiologist: Lindsey Klein, MD   Subjective   Patient is seen at bedside. She is confused but not agitated. She denies Sob or CP but refuse physical exam.   Inpatient Medications    Scheduled Meds:  apixaban  2.5 mg Oral BID   diltiazem  300 mg Oral Daily   ferrous sulfate  325 mg Oral Q breakfast   furosemide  40 mg Intravenous BID   mouth rinse  15 mL Mouth Rinse BID   metoprolol tartrate  100 mg Oral BID   pantoprazole  40 mg Oral Daily   sodium chloride flush  3 mL Intravenous Q12H   Continuous Infusions:  sodium chloride     PRN Meds: sodium chloride, acetaminophen, ALPRAZolam, linaclotide, ondansetron (ZOFRAN) IV, sodium chloride flush   Vital Signs    Vitals:   04/13/21 1110 04/13/21 1517 04/13/21 2128 04/14/21 0533  BP: (!) 152/80  (!) 155/98 (!) 147/79  Pulse: (!) 107  (!) 117 78  Resp: 16   18  Temp: 97.8 F (36.6 C)   97.8 F (36.6 C)  TempSrc: Oral   Oral  SpO2: 95% 94%  94%  Weight:    61.2 kg  Height:        Intake/Output Summary (Last 24 hours) at 04/14/2021 0834 Last data filed at 04/14/2021 0531 Gross per 24 hour  Intake 354 ml  Output 1700 ml  Net -1346 ml   Filed Weights   04/11/21 0942 04/12/21 0447 04/14/21 0533  Weight: 63.8 kg 63.2 kg 61.2 kg    Telemetry    Mostly rate controlled. Rate better this AM- Personally Reviewed  ECG    2/13: Regular rhythm. LBBB, LVH- Personally Reviewed  Physical Exam   GEN: No acute distress.   Cardiac: patient refuse exam Respiratory: normal work of breathing. 93% on room air MS: No edema; No deformity. Neuro:  Nonfocal  Psych: Normal affect   Labs    Chemistry Recent Labs  Lab 04/10/21 1555 04/11/21 0905 04/12/21 0122 04/13/21 0427 04/14/21 0618  NA 138   < > 139 139 137  K 3.9   < > 3.8 3.9 3.8  CL 101   < > 101 102 97*  CO2 23   < > 25 24 27   GLUCOSE 125*   < > 97 97 114*  BUN 24*   <  > 28* 24* 25*  CREATININE 1.63*   < > 2.09* 1.85* 1.91*  CALCIUM 8.4*   < > 8.3* 8.7* 8.8*  PROT 7.3  --   --   --   --   ALBUMIN 3.3*  --   --   --   --   AST 20  --   --   --   --   ALT 12  --   --   --   --   ALKPHOS 57  --   --   --   --   BILITOT 0.6  --   --   --   --   GFRNONAA 30*   < > 22* 26* 25*  ANIONGAP 14   < > 13 13 13    < > = values in this interval not displayed.     Hematology Recent Labs  Lab 04/10/21 1555 04/14/21 0618  WBC 16.0* 10.2  RBC 4.15 4.17  HGB 10.4* 10.3*  HCT 33.8* 33.3*  MCV 81.4 79.9*  MCH  25.1* 24.7*  MCHC 30.8 30.9  RDW 17.0* 17.0*  PLT 257 220    Cardiac EnzymesNo results for input(s): TROPONINI in the last 168 hours. No results for input(s): TROPIPOC in the last 168 hours.   BNP Recent Labs  Lab 04/10/21 1555  BNP 1,007.3*     DDimer No results for input(s): DDIMER in the last 168 hours.   Radiology    No results found.  Cardiac Studies   Echocardiogram 2/13   1. Left ventricular ejection fraction, by estimation, is 60 to 65%. The  left ventricle has normal function. The left ventricle has no regional  wall motion abnormalities. There is moderate left ventricular hypertrophy.  Left ventricular diastolic  parameters are indeterminate.   2. Right ventricular systolic function was not well visualized. The right  ventricular size is not well visualized. There is normal pulmonary artery  systolic pressure. The estimated right ventricular systolic pressure is  73.4 mmHg.   3. The mitral valve is degenerative. Trivial mitral valve regurgitation.  No evidence of mitral stenosis.   4. There is a bioprosthetic valve present in the aortic position.      Aortic valve regurgitation is trivial. Vmax 3.4 m/s, MG 29 mmHg, EOA  0.5 cm^2, DI 0.2. Findings suggest significant prosthetic valve stenosis,  gradient has significantly increased from prior echo 08/2020 (15 > 29 mmHg)   5. The inferior vena cava is normal in size with greater  than 50%  respiratory variability, suggesting right atrial pressure of 3 mmHg.   6. Left atrial size was mildly dilated.   Patient Profile     88 y.o. female with past medical history of paroxysmal A-fib on Eliquis, sinus node dysfunction status post dual-chamber pacemakerin 2005 with a generator change in 2014 and again in 09/2020, aortic stenosis status post AVR 2005, hypertension, hyperlipidemia, type 2 diabetes, CKD 3 who was admitted for acute on chronic diastolic heart failure exacerbation.  Assessment & Plan    Acute on chronic diastolic heart failure In the setting of worsening bioprosthetic aortic stenosis.  Patient no longer requires supplemental oxygen.  She had good urine output overnight IV Lasix.  Lowest weight seen in chart last year was around 146 lbs.  -Suspect she is at dry wt. No longer require supplemental. Will transition to po lasix. Increase to Lasix 40 mg PO daily.   Paroxysmal atrial fibrillation Sinus node dysfunction status post pacemaker placement She is rate controlled. -Continue Lopressor 100 mg BID -Continue Cardizem 300 mg daily -Anticoagulation with Eliquis 2.5 mg BID.  CHA2DS2-VASc score 6  Worsening aortic stenosis Agree with conservative treatment for now. Can consider TAVR outpatient.  Hyperlipidemia Suspect heterozygous familial hypercholesterolemia.  Last LDL 134.  She is not able to tolerate statins. -Continue Zetia and Repatha -Would not repeat lipid panel.  No change in management regardless   AKI on CKD3 Cardiorenal vs pre-renal. She appears euvolemic on exam. Baseline around 1.6. Cr 1.91 today. Hopefully will improve with less diuresis.  - Continue holding ACE.   For questions or updates, please contact [place] Please consult [email or web address] for contact info under Cardiology/STEMI.      Signed, Lindsey Gerold, DO  04/14/2021, 8:34 AM     I have seen and examined the patient along with Lindsey Gerold, DO .  I have reviewed the chart,  notes and new data.  I agree with PA/NP's note.  Key new complaints: Her daughter is here now and that helps keep [name] more  oriented and calm.  She denies problems with shortness of breath.  She is no longer wearing oxygen. Key examination changes: Irregular rhythm, varying intensity of her systolic ejection murmur but it appears to be consistently mid peaking. Key new findings / data: Echocardiogram is consistent with paradoxical low-flow low gradient severe aortic stenosis (very low stroke-volume index, moderately elevated gradients, very low dimensionless valve index).  PLAN: Decompensation appears related to worsening aortic stenosis of her bioprosthetic valve which was implanted more than 18 years ago. We discussed the option for TAVR. From a strictly medical point of view she is at best a marginal candidate for TAVR due to her age, overall frailty, renal dysfunction, and the need for valve in valve surgery with a relatively small annulus bioprosthesis and her cognitive dysfunction. The patient states that she does not want any type of surgery and I believe by this she means even cardiac catheterization, let alone TAVR. Her daughter [name] understands the limitations in her mother's lifespan and does not want her to suffer and her last months. All in all, all parties involved appear to agree that she is best suited for a palliative care approach.  Recommend palliative care team consultation. She had significant sundowning last night and I think the sooner we get her home, the better the chance that this will not occur again. Increase her home dose of furosemide to 40 mg daily.  Daily weights, if weight increases by more than 3 pounds from hospital discharge weight (135 pounds on the hospital scale), double the dose to 80 mg daily until her weight is back down to 135 pounds.  [facility] will sign off.   Medication Recommendations:   Furosemide 40 mg daily on days when weight is  less than 138 pounds Furosemide 80 mg daily on days when weight exceeds 138 pounds Eliquis 2.5 mg twice daily Diltiazem 240 mg daily Metoprolol tartrate 100 mg twice daily Stop benazepril Other recommendations (labs, testing, etc): Dietary sodium restriction.  Daily weights.  Keep a log of weights and bring to clinic.  Palliative care consultation as outpatient Follow up as an outpatient: We will make arrangements for follow-up in the next couple of weeks    Lindsey Klein, MD, [place] [phone] 04/14/2021, 4:25 PM

## 2021-04-15 LAB — BASIC METABOLIC PANEL
Anion gap: 12 (ref 5–15)
BUN: 31 mg/dL — ABNORMAL HIGH (ref 8–23)
CO2: 26 mmol/L (ref 22–32)
Calcium: 8.6 mg/dL — ABNORMAL LOW (ref 8.9–10.3)
Chloride: 100 mmol/L (ref 98–111)
Creatinine, Ser: 2.56 mg/dL — ABNORMAL HIGH (ref 0.44–1.00)
GFR, Estimated: 17 mL/min — ABNORMAL LOW (ref >60)
Glucose, Bld: 104 mg/dL — ABNORMAL HIGH (ref 70–99)
Potassium: 4.8 mmol/L (ref 3.5–5.1)
Sodium: 138 mmol/L (ref 135–145)

## 2021-04-15 LAB — GLUCOSE, CAPILLARY: Glucose-Capillary: 95 mg/dL (ref 70–99)

## 2021-04-15 MED ORDER — FUROSEMIDE 40 MG PO TABS: 40.0000 mg | ORAL_TABLET | Freq: Every day | ORAL | 0 refills | Status: DC

## 2021-04-15 NOTE — Progress Notes (Addendum)
PROGRESS NOTE    BASSHEVA FLURY  WYO:[id number] DOB: 02-Oct-1929 DOA: 04/10/2021 PCP: Imagene Riches, NP  Brief Narrative:89/F with history of chronic diastolic CHF, SSS status post PPM, permanent A-fib/a flutter on Eliquis, history of bioprosthetic, AVR, CAD, hypertension, CKD 3B presented to the ED with increasing shortness of breath X 2 weeks. -In the ED she was hypoxic, 80s requiring 3 L [place], blood pressure 160s/113, chest x-ray noted bilateral pleural effusion, interstitial edema, BNP> 1000, creatinine was 1.6  -Echo noted preserved EF, concern for significant stenosis of bioprosthetic aortic valve -diuresed with IV lasix, Cards following  Subjective: -Yesterday evening reported dizziness when she stood up and attempted to ambulate, daughter became extremely nervous and discharge was deferred -Denies any specific complaints this morning, son is at bedside  Assessment and Plan:  * Acute CHF (congestive heart failure) (HCC) Acute diastolic CHF (congestive heart failure) (HCC) Aortic stenosis Echocardiogram with preserved LV systolic function EF 60 to 65%, mod LVH, bioprosthetic aortic valve with concern for significant stenosis  -Diuresed with IV Lasix, 4.2 L negative, appears euvolemic -Transitioned to oral Lasix, unfortunately creatinine bumped to 2.5 will hold diuretics today -Appreciate cardiology input, plan for conservative management of aortic stenosis -Ambulate, PT eval, palliative consult requested  Persistent atrial fibrillation with RVR -s/p pacer for SSS -Heart rate improving, continue metoprolol 100 mg twice daily and diltiazem 240 -Continue apixaban  Iron deficiency anemia- (present on admission) - Stable, continue iron supplementation  Acute kidney injury superimposed on chronic kidney disease ([place])- (present on admission) CKD stage 3b, hypokalemia Baseline creatinine around 1.6, creatinine trended up to 2.5 -Holding diuretics today  Essential hypertension,  benign- (present on admission) -Stable continue with metoprolol and diltiazem -Stable, see discussion above  Type 2 diabetes mellitus with hyperlipidemia ([place])- (present on admission) - CBGs in normal range, and insulin discontinued   DVT prophylaxis: Apixaban Code Status: DNR Family Communication: Discussed with son at bedside Disposition Plan: Home later today or in a.m.  Consultants:  Cardiology  Procedures:   Antimicrobials:    Objective: Vitals:   04/14/21 2030 04/15/21 0355 04/15/21 0904 04/15/21 1111  BP: 130/61 107/76 125/70 126/68  Pulse: 70 63 99 90  Resp: 18 10 16 17   Temp: 97.7 F (36.5 C) 98.5 F (36.9 C) 98.4 F (36.9 C) 98.1 F (36.7 C)  TempSrc: Oral Oral Oral Oral  SpO2: 96% 98% 97% 97%  Weight:  61.5 kg    Height:        Intake/Output Summary (Last 24 hours) at 04/15/2021 1222 Last data filed at 04/15/2021 0900 Gross per 24 hour  Intake 120 ml  Output 700 ml  Net -580 ml   Filed Weights   04/12/21 0447 04/14/21 0533 04/15/21 0355  Weight: 63.2 kg 61.2 kg 61.5 kg    Examination:  General exam: Pleasant elderly female sitting up in bed, somnolent but easily arousable, but confused CVS: S1-S2, regular rhythm, systolic murmur Lungs: Decreased breath sounds to bases Abdomen: Soft, nontender, bowel sounds present Extremities: No edema Skin: No rashes Psychiatry: Poor insight and judgment    Data Reviewed:   CBC: Recent Labs  Lab 04/10/21 1555 04/14/21 0618  WBC 16.0* 10.2  NEUTROABS 12.2*  --   HGB 10.4* 10.3*  HCT 33.8* 33.3*  MCV 81.4 79.9*  PLT 257 774   Basic Metabolic Panel: Recent Labs  Lab 04/10/21 1642 04/11/21 0905 04/12/21 0122 04/13/21 0427 04/14/21 0618 04/15/21 0404  NA  --  139 139 139 137 138  K  --  4.1 3.8 3.9 3.8 4.8  CL  --  102 101 102 97* 100  CO2  --  23 25 24 27 26   GLUCOSE  --  95 97 97 114* 104*  BUN  --  24* 28* 24* 25* 31*  CREATININE  --  1.81* 2.09* 1.85* 1.91* 2.56*  CALCIUM  --  8.2*  8.3* 8.7* 8.8* 8.6*  MG 1.3*  --  1.6*  --  1.9  --    GFR: Estimated Creatinine Clearance: 12.9 mL/min (A) (by C-G formula based on SCr of 2.56 mg/dL (H)). Liver Function Tests: Recent Labs  Lab 04/10/21 1555  AST 20  ALT 12  ALKPHOS 57  BILITOT 0.6  PROT 7.3  ALBUMIN 3.3*   No results for input(s): LIPASE, AMYLASE in the last 168 hours. No results for input(s): AMMONIA in the last 168 hours. Coagulation Profile: No results for input(s): INR, PROTIME in the last 168 hours. Cardiac Enzymes: No results for input(s): CKTOTAL, CKMB, CKMBINDEX, TROPONINI in the last 168 hours. BNP (last 3 results) No results for input(s): PROBNP in the last 8760 hours. HbA1C: No results for input(s): HGBA1C in the last 72 hours. CBG: Recent Labs  Lab 04/11/21 0917 04/11/21 1548 04/15/21 0611  GLUCAP 92 104* 95   Lipid Profile: No results for input(s): CHOL, HDL, LDLCALC, TRIG, CHOLHDL, LDLDIRECT in the last 72 hours. Thyroid Function Tests: Recent Labs    04/14/21 0618  TSH 2.959   Anemia Panel: No results for input(s): VITAMINB12, FOLATE, FERRITIN, TIBC, IRON, RETICCTPCT in the last 72 hours. Urine analysis:    Component Value Date/Time   COLORURINE COLORLESS (A) 04/10/2021 2042   APPEARANCEUR CLEAR 04/10/2021 2042   LABSPEC 1.005 04/10/2021 2042   PHURINE 5.0 04/10/2021 2042   GLUCOSEU NEGATIVE 04/10/2021 2042   HGBUR SMALL (A) 04/10/2021 2042   BILIRUBINUR NEGATIVE 04/10/2021 2042   BILIRUBINUR NEG 09/02/2014 1110   KETONESUR NEGATIVE 04/10/2021 2042   PROTEINUR 100 (A) 04/10/2021 2042   UROBILINOGEN 2.0 09/02/2014 1110   UROBILINOGEN 0.2 11/13/2009 1539   NITRITE NEGATIVE 04/10/2021 2042   LEUKOCYTESUR TRACE (A) 04/10/2021 2042   Sepsis Labs: @LABRCNTIP (procalcitonin:4,lacticidven:4)  ) Recent Results (from the past 240 hour(s))  Resp Panel by RT-PCR (Flu A&B, Covid) Nasopharyngeal Swab     Status: None   Collection Time: 04/10/21  4:10 PM   Specimen:  Nasopharyngeal Swab; Nasopharyngeal(NP) swabs in vial transport medium  Result Value Ref Range Status   SARS Coronavirus 2 by RT PCR NEGATIVE NEGATIVE Final    Comment: (NOTE) SARS-CoV-2 target nucleic acids are NOT DETECTED.  The SARS-CoV-2 RNA is generally detectable in upper respiratory specimens during the acute phase of infection. The lowest concentration of SARS-CoV-2 viral copies this assay can detect is 138 copies/mL. A negative result does not preclude SARS-Cov-2 infection and should not be used as the sole basis for treatment or other patient management decisions. A negative result may occur with  improper specimen collection/handling, submission of specimen other than nasopharyngeal swab, presence of viral mutation(s) within the areas targeted by this assay, and inadequate number of viral copies(<138 copies/mL). A negative result must be combined with clinical observations, patient history, and epidemiological information. The expected result is Negative.  Fact Sheet for Patients:  EntrepreneurPulse.com.au  Fact Sheet for Healthcare Providers:  IncredibleEmployment.be  This test is no t yet approved or cleared by the [place] FDA and  has been authorized for detection and/or diagnosis of SARS-CoV-2 by FDA  under an Emergency Use Authorization (EUA). This EUA will remain  in effect (meaning this test can be used) for the duration of the COVID-19 declaration under Section 564(b)(1) of the Act, 21 U.S.C.section 360bbb-3(b)(1), unless the authorization is terminated  or revoked sooner.       Influenza A by PCR NEGATIVE NEGATIVE Final   Influenza B by PCR NEGATIVE NEGATIVE Final    Comment: (NOTE) The Xpert Xpress SARS-CoV-2/FLU/RSV plus assay is intended as an aid in the diagnosis of influenza from Nasopharyngeal swab specimens and should not be used as a sole basis for treatment. Nasal washings and aspirates are unacceptable for  Xpert Xpress SARS-CoV-2/FLU/RSV testing.  Fact Sheet for Patients: EntrepreneurPulse.com.au  Fact Sheet for Healthcare Providers: IncredibleEmployment.be  This test is not yet approved or cleared by the [place] FDA and has been authorized for detection and/or diagnosis of SARS-CoV-2 by FDA under an Emergency Use Authorization (EUA). This EUA will remain in effect (meaning this test can be used) for the duration of the COVID-19 declaration under Section 564(b)(1) of the Act, 21 U.S.C. section 360bbb-3(b)(1), unless the authorization is terminated or revoked.  Performed at [facility], [place] [address] [place], [place] [address]      Radiology Studies: No results found.   Scheduled Meds:  apixaban  2.5 mg Oral BID   diltiazem  300 mg Oral Daily   ferrous sulfate  325 mg Oral Q breakfast   mouth rinse  15 mL Mouth Rinse BID   metoprolol tartrate  100 mg Oral BID   pantoprazole  40 mg Oral Daily   sodium chloride flush  3 mL Intravenous Q12H   Continuous Infusions:  sodium chloride       LOS: 5 days    Time spent: 32min  Domenic Polite, MD [facility]   04/15/2021, 12:22 PM

## 2021-04-15 NOTE — Discharge Summary (Signed)
Physician Discharge Summary  [name] ZOX:[id number] DOB: 1930-02-09 DOA: 04/10/2021  PCP: Imagene Riches, NP  Admit date: 04/10/2021 Discharge date: 04/15/2021  Time spent: 50minutes  Recommendations for Outpatient Follow-up:  Cardiology Dr. Sallyanne Kuster in 1 week Continue goals of care discussions Urgent palliative care referral sent, will need home hospice services set up as she declines further   Discharge Diagnoses:  Principal Problem:   Acute CHF (congestive heart failure) (HCC) Severe aortic stenosis Adult failure to thrive Delirium   Persistent atrial fibrillation (HCC)   Iron deficiency anemia   Acute kidney injury superimposed on chronic kidney disease ([place])   Essential hypertension, benign   Type 2 diabetes mellitus with hyperlipidemia ([place])   Aortic stenosis DO NOT RESUSCITATE  Discharge Condition: Stable  Diet recommendation: Low-sodium, heart healthy  Filed Weights   04/12/21 0447 04/14/21 0533 04/15/21 0355  Weight: 63.2 kg 61.2 kg 61.5 kg    History of present illness:  88/F with history of chronic diastolic CHF, SSS status post PPM, permanent A-fib/a flutter on Eliquis, history of bioprosthetic, AVR, CAD, hypertension, CKD 3B presented to the ED with increasing shortness of breath X 2 weeks. -In the ED she was hypoxic, 80s requiring 3 L [place], blood pressure 160s/113, chest x-ray noted bilateral pleural effusion, interstitial edema, BNP> 1000, creatinine was 1.6  -Echo noted preserved EF, concern for significant stenosis of bioprosthetic aortic valve  Hospital Course:   Acute CHF (congestive heart failure) (HCC) Acute diastolic CHF (congestive heart failure) (HCC) Severe aortic stenosis Echocardiogram noted preserved LV systolic function EF 60 to 65%, mod LVH, bioprosthetic aortic valve with concern for significant stenosis  -Diuresed with IV Lasix, 5 L negative, appears euvolemic -Transitioned to oral Lasix, unfortunately creatinine bumped to 2.5  today, will hold diuretics today and tomorrow, advised daughter to resume this on Sunday as recommended by cardiology for Lasix 80 mg daily when weight is> 138 LB and 40 Mg daily when-<138 LB  -Followed by cardiology this admission, recommended conservative management with palliative care, on account of her delirium in the hospital we elected to discharge her home with urgent palliative care referral.  Poor prognosis has been discussed with patient's daughter by myself and Dr. Sallyanne Kuster, she will need hospice referral in the near future -She will be discharged home with her daughter today, close follow-up with cardiology next week   Persistent atrial fibrillation with RVR -s/p pacer for SSS -Heart rate improving, continue metoprolol 100 mg twice daily and diltiazem 240 -Continue apixaban   Iron deficiency anemia- (present on admission) - Stable, continue iron supplementation   Acute kidney injury superimposed on chronic kidney disease ([place])- (present on admission) CKD stage 3b, hypokalemia Baseline creatinine around 1.6, creatinine trended up to 2.5 -Holding diuretics today and tomorrow, restart on Sunday   Essential hypertension, benign- (present on admission) -Stable continue with metoprolol and diltiazem -Stable, see discussion above   Type 2 diabetes mellitus with hyperlipidemia ([place])- (present on admission) - CBGs in normal range, diet controlled    Delirium -Mild intermittent confusion noted this hospital stay, overall stable, may have a component of senile dementia  Code Status: DNR  Consultants:  Cardiology  Discharge Exam: Vitals:   04/15/21 0904 04/15/21 1111  BP: 125/70 126/68  Pulse: 99 90  Resp: 16 17  Temp: 98.4 F (36.9 C) 98.1 F (36.7 C)  SpO2: 97% 97%  General exam: Pleasant elderly female sitting up in bed, somnolent but easily arousable, less confused today CVS: S1-S2, regular rhythm,  systolic murmur Lungs: Decreased breath sounds to bases Abdomen:  Soft, nontender, bowel sounds present Extremities: No edema Skin: No rashes Psychiatry: Poor insight and judgment  Discharge Instructions   Discharge Instructions     Amb Referral to Palliative Care   Complete by: As directed    88/F with CHF, severe AS and failure to thrive   Amb Referral to Palliative Care   Complete by: As directed    88/F with congestive heart failure, severe aortic stenosis, not a candidate for surgical management, poor prognosis   Diet - low sodium heart healthy   Complete by: As directed    Diet - low sodium heart healthy   Complete by: As directed    Increase activity slowly   Complete by: As directed    Increase activity slowly   Complete by: As directed       Allergies as of 04/15/2021       Reactions   Escitalopram    Insomnia / paranoid    Pravachol [pravastatin Sodium] Other (See Comments)   myalgias   Tape Other (See Comments)   Patient states that she has skin cancer and that it is thin (PLEASE USE COBAN WRAP INSTEAD OF TAPE!)        Medication List     STOP taking these medications    benazepril 20 MG tablet Commonly known as: LOTENSIN   doxycycline 100 MG capsule Commonly known as: VIBRAMYCIN       TAKE these medications    ALPRAZolam 1 MG tablet Commonly known as: XANAX Take 0.5 tablets (0.5 mg total) by mouth 2 (two) times daily as needed for anxiety. What changed:  how much to take when to take this   apixaban 2.5 MG Tabs tablet Commonly known as: Eliquis TAKE 1 TABLET (2.5 MG TOTAL) BY MOUTH 2 (TWO) TIMES DAILY. What changed:  how much to take how to take this when to take this additional instructions   diltiazem 240 MG 24 hr capsule Commonly known as: CARDIZEM CD TAKE 1 CAPSULE (240 MG TOTAL) BY MOUTH DAILY. NEED OFFICE VISIT What changed: See the new instructions.   ezetimibe 10 MG tablet Commonly known as: ZETIA TAKE 1 TABLET (10 MG TOTAL) BY MOUTH DAILY.   ferrous sulfate 325 (65 FE) MG  tablet Take 1 tablet (325 mg total) by mouth daily with breakfast.   fluticasone 50 MCG/ACT nasal spray Commonly known as: FLONASE Place 1 spray into both nostrils daily as needed for allergies.   furosemide 40 MG tablet Commonly known as: LASIX Take 1-2 tablets (40-80 mg total) by mouth daily. Take 68m daily when weight is<138lbs and take 860mdaily when weight is >138lbs Start taking on: April 17, 2021 What changed:  medication strength how much to take additional instructions These instructions start on April 17, 2021. If you are unsure what to do until then, ask your doctor or other care provider.   linaclotide 145 MCG Caps capsule Commonly known as: LINZESS Take 145 mcg by mouth daily as needed (constipation).   metFORMIN 500 MG tablet Commonly known as: GLUCOPHAGE TAKE 1 TABLET (500 MG TOTAL) BY MOUTH 2 (TWO) TIMES DAILY WITH A MEAL.   metoprolol tartrate 100 MG tablet Commonly known as: LOPRESSOR Take 1 tablet (100 mg total) by mouth 2 (two) times daily.   omeprazole 20 MG capsule Commonly known as: PRILOSEC TAKE 1 CAPSULE BY MOUTH EVERY DAY What changed:  how much to take how to take this   ONE  TOUCH ULTRA 2 w/Device Kit   ONE TOUCH ULTRA TEST test strip Generic drug: glucose blood TEST BLOOD SUGAR TWICE A DAY   potassium chloride SA 20 MEQ tablet Commonly known as: KLOR-CON M Take 2 tablets (40 mEq total) by mouth daily.   Repatha SureClick 282 MG/ML Soaj Generic drug: Evolocumab INJECT 140 MG INTO THE SKIN EVERY 14 (FOURTEEN) DAYS.   VITAMIN DEFICIENCY SYSTEM-B12 IJ Inject 1 Dose as directed every 30 (thirty) days. Not sure about dosage               Durable Medical Equipment  (From admission, onward)           Start     Ordered   04/15/21 1254  For home use only DME Walker rolling  Once       Question Answer Comment  Walker: With [place] Wheels   Patient needs a walker to treat with the following condition Weakness       04/15/21 1254   04/14/21 1708  For home use only DME oxygen  Once       Question Answer Comment  Length of Need 6 Months   Mode or (Route) Nasal cannula   Liters per Minute 2   Frequency Continuous (stationary and portable oxygen unit needed)   Oxygen conserving device Yes   Oxygen delivery system Gas      04/14/21 1707   04/14/21 1707  For home use only DME oxygen  Once       Question Answer Comment  Length of Need 6 Months   Mode or (Route) Nasal cannula   Liters per Minute 2   Frequency Continuous (stationary and portable oxygen unit needed)   Oxygen conserving device Yes   Oxygen delivery system Gas      04/14/21 1706   04/13/21 1130  For home use only DME Shower stool  Once        04/13/21 1129           Allergies  Allergen Reactions   Escitalopram     Insomnia / paranoid    Pravachol [Pravastatin Sodium] Other (See Comments)    myalgias   Tape Other (See Comments)    Patient states that she has skin cancer and that it is thin (PLEASE USE COBAN WRAP INSTEAD OF TAPE!)    [place] Follow up.   Specialty: Home Health Services Why: HHPT, HHST- [facility] will call you with apt times. Contact information: [place] [address] [place] [facility] Follow up.   Why: oxygen, shower chair, shower chair, walker Contact information: [place] [id number] [phone]                  The results of significant diagnostics from this hospitalization (including imaging, microbiology, ancillary and laboratory) are listed below for reference.    Significant Diagnostic Studies: DG Chest Portable 1 View  Result Date: 04/10/2021 CLINICAL DATA:  Shortness of breath.  Concern for pneumonia. EXAM: PORTABLE CHEST 1 VIEW COMPARISON:  03/29/2021 FINDINGS: A pacemaker remains in place with leads terminating over the right atrium and right ventricle.  There is unchanged enlargement of the cardiac silhouette status post aortic valve replacement. Aortic atherosclerosis is noted. There are persistent small bilateral pleural effusions with bibasilar airspace opacities and mild accentuation of the interstitial markings. No pneumothorax is  identified. No acute osseous abnormality is seen. IMPRESSION: Cardiomegaly with persistent small bilateral pleural effusions, possible mild interstitial edema, and bibasilar atelectasis or consolidation. Electronically Signed   By: Logan Bores M.D.   On: 04/10/2021 16:30   ECHOCARDIOGRAM COMPLETE  Result Date: 04/11/2021    ECHOCARDIOGRAM REPORT   Patient Name:   [name] Date of Exam: 04/11/2021 Medical Rec #:  [id number]          Height:       64.0 in Accession #:    [id number]         Weight:       140.7 lb Date of Birth:  Oct 16, 1929          BSA:          1.685 m Patient Age:    5 years           BP:           134/63 mmHg Patient Gender: F                  HR:           79 bpm. Exam Location:  Inpatient Procedure: 2D Echo, Color Doppler and Cardiac Doppler Indications:     M38.17 Acute systolic (congestive) heart failure  History:         Patient has prior history of Echocardiogram examinations, most                  recent 08/27/2020. CHF, CAD, Pacemaker, Arrythmias:Atrial                  Fibrillation; Risk Factors:Hypertension, Diabetes and                  Dyslipidemia. ?58mm Bioprosthetic Aortic Valve implanted                  08/21/2002.  Sonographer:     Raquel Sarna Senior RDCS Referring Phys:  [id number] Angela Burke [facility] Diagnosing Phys: Oswaldo Milian MD  Sonographer Comments: Technically difficult due to thin body habitus. IMPRESSIONS  1. Left ventricular ejection fraction, by estimation, is 60 to 65%. The left ventricle has normal function. The left ventricle has no regional wall motion abnormalities. There is moderate left ventricular hypertrophy. Left ventricular diastolic parameters are indeterminate.  2. Right  ventricular systolic function was not well visualized. The right ventricular size is not well visualized. There is normal pulmonary artery systolic pressure. The estimated right ventricular systolic pressure is 03.8 mmHg.  3. The mitral valve is degenerative. Trivial mitral valve regurgitation. No evidence of mitral stenosis.  4. There is a bioprosthetic valve present in the aortic position.     Aortic valve regurgitation is trivial. Vmax 3.4 m/s, MG 29 mmHg, EOA 0.5 cm^2, DI 0.2. Findings suggest significant prosthetic valve stenosis, gradient has significantly increased from prior echo 08/2020 (15 > 29 mmHg)  5. The inferior vena cava is normal in size with greater than 50% respiratory variability, suggesting right atrial pressure of 3 mmHg.  6. Left atrial size was mildly dilated. FINDINGS  Left Ventricle: Left ventricular ejection fraction, by estimation, is 60 to 65%. The left ventricle has normal function. The left ventricle has no regional wall motion abnormalities. The left ventricular internal cavity size was normal in size. There is  moderate left ventricular hypertrophy. Left ventricular diastolic parameters are indeterminate. Right Ventricle: The right ventricular size is not well visualized. Right vetricular wall thickness was not well visualized. Right  ventricular systolic function was not well visualized. There is normal pulmonary artery systolic pressure. The tricuspid regurgitant velocity is 2.37 m/s, and with an assumed right atrial pressure of 3 mmHg, the estimated right ventricular systolic pressure is 95.1 mmHg. Left Atrium: Left atrial size was mildly dilated. Right Atrium: Right atrial size was normal in size. Pericardium: There is no evidence of pericardial effusion. Mitral Valve: The mitral valve is degenerative in appearance. Mild to moderate mitral annular calcification. Trivial mitral valve regurgitation. No evidence of mitral valve stenosis. Tricuspid Valve: The tricuspid valve is normal  in structure. Tricuspid valve regurgitation is trivial. Aortic Valve: The aortic valve has been repaired/replaced. Aortic valve regurgitation is trivial. Aortic valve mean gradient measures 28.8 mmHg. Aortic valve peak gradient measures 42.8 mmHg. Aortic valve area, by VTI measures 0.48 cm. There is a bioprosthetic valve present in the aortic position. Pulmonic Valve: The pulmonic valve was not well visualized. Pulmonic valve regurgitation is not visualized. Aorta: The aortic root and ascending aorta are structurally normal, with no evidence of dilitation. Venous: The inferior vena cava is normal in size with greater than 50% respiratory variability, suggesting right atrial pressure of 3 mmHg. IAS/Shunts: The interatrial septum was not well visualized.  LEFT VENTRICLE PLAX 2D LVIDd:         4.20 cm LVIDs:         2.70 cm LV PW:         1.40 cm LV IVS:        1.30 cm LVOT diam:     1.80 cm LV SV:         43 LV SV Index:   25 LVOT Area:     2.54 cm  RIGHT VENTRICLE RV S prime:     8.78 cm/s TAPSE (M-mode): 1.8 cm LEFT ATRIUM             Index        RIGHT ATRIUM           Index LA diam:        3.60 cm 2.14 cm/m   RA Area:     17.00 cm LA Vol (A2C):   62.3 ml 36.98 ml/m  RA Volume:   44.70 ml  26.54 ml/m LA Vol (A4C):   57.5 ml 34.13 ml/m LA Biplane Vol: 61.8 ml 36.69 ml/m  AORTIC VALVE AV Area (Vmax):    0.56 cm AV Area (Vmean):   0.49 cm AV Area (VTI):     0.48 cm AV Vmax:           326.93 cm/s AV Vmean:          258.245 cm/s AV VTI:            0.886 m AV Peak Grad:      42.8 mmHg AV Mean Grad:      28.8 mmHg LVOT Vmax:         72.10 cm/s LVOT Vmean:        50.000 cm/s LVOT VTI:          0.168 m LVOT/AV VTI ratio: 0.19  AORTA Ao Root diam: 2.30 cm Ao Asc diam:  2.90 cm TRICUSPID VALVE TR Peak grad:   22.5 mmHg TR Vmax:        237.00 cm/s  SHUNTS Systemic VTI:  0.17 m Systemic Diam: 1.80 cm Oswaldo Milian MD Electronically signed by Oswaldo Milian MD Signature Date/Time: 04/11/2021/1:45:12 PM     Final (Updated)     Microbiology: Recent  Results (from the past 240 hour(s))  Resp Panel by RT-PCR (Flu A&B, Covid) Nasopharyngeal Swab     Status: None   Collection Time: 04/10/21  4:10 PM   Specimen: Nasopharyngeal Swab; Nasopharyngeal(NP) swabs in vial transport medium  Result Value Ref Range Status   SARS Coronavirus 2 by RT PCR NEGATIVE NEGATIVE Final    Comment: (NOTE) SARS-CoV-2 target nucleic acids are NOT DETECTED.  The SARS-CoV-2 RNA is generally detectable in upper respiratory specimens during the acute phase of infection. The lowest concentration of SARS-CoV-2 viral copies this assay can detect is 138 copies/mL. A negative result does not preclude SARS-Cov-2 infection and should not be used as the sole basis for treatment or other patient management decisions. A negative result may occur with  improper specimen collection/handling, submission of specimen other than nasopharyngeal swab, presence of viral mutation(s) within the areas targeted by this assay, and inadequate number of viral copies(<138 copies/mL). A negative result must be combined with clinical observations, patient history, and epidemiological information. The expected result is Negative.  Fact Sheet for Patients:  EntrepreneurPulse.com.au  Fact Sheet for Healthcare Providers:  IncredibleEmployment.be  This test is no t yet approved or cleared by the [place] FDA and  has been authorized for detection and/or diagnosis of SARS-CoV-2 by FDA under an Emergency Use Authorization (EUA). This EUA will remain  in effect (meaning this test can be used) for the duration of the COVID-19 declaration under Section 564(b)(1) of the Act, 21 U.S.C.section 360bbb-3(b)(1), unless the authorization is terminated  or revoked sooner.       Influenza A by PCR NEGATIVE NEGATIVE Final   Influenza B by PCR NEGATIVE NEGATIVE Final    Comment: (NOTE) The Xpert Xpress  SARS-CoV-2/FLU/RSV plus assay is intended as an aid in the diagnosis of influenza from Nasopharyngeal swab specimens and should not be used as a sole basis for treatment. Nasal washings and aspirates are unacceptable for Xpert Xpress SARS-CoV-2/FLU/RSV testing.  Fact Sheet for Patients: EntrepreneurPulse.com.au  Fact Sheet for Healthcare Providers: IncredibleEmployment.be  This test is not yet approved or cleared by the [place] FDA and has been authorized for detection and/or diagnosis of SARS-CoV-2 by FDA under an Emergency Use Authorization (EUA). This EUA will remain in effect (meaning this test can be used) for the duration of the COVID-19 declaration under Section 564(b)(1) of the Act, 21 U.S.C. section 360bbb-3(b)(1), unless the authorization is terminated or revoked.  Performed at [facility], [place] [address], [place], [place] [address]      Labs: Basic Metabolic Panel: Recent Labs  Lab 04/10/21 1642 04/11/21 0905 04/12/21 0122 04/13/21 0427 04/14/21 0618 04/15/21 0404  NA  --  139 139 139 137 138  K  --  4.1 3.8 3.9 3.8 4.8  CL  --  102 101 102 97* 100  CO2  --  _0 GLUCOSE  --  95 97 97 114* 104*  BUN  --  24* 28* 24* 25* 31*  CREATININE  --  1.81* 2.09* 1.85* 1.91* 2.56*  CALCIUM  --  8.2* 8.3* 8.7* 8.8* 8.6*  MG 1.3*  --  1.6*  --  1.9  --    Liver Function Tests: Recent Labs  Lab 04/10/21 1555  AST 20  ALT 12  ALKPHOS 57  BILITOT 0.6  PROT 7.3  ALBUMIN 3.3*   No results for input(s): LIPASE, AMYLASE in the last 168 hours. No results for input(s): AMMONIA in the last  168 hours. CBC: Recent Labs  Lab 04/10/21 1555 04/14/21 0618  WBC 16.0* 10.2  NEUTROABS 12.2*  --   HGB 10.4* 10.3*  HCT 33.8* 33.3*  MCV 81.4 79.9*  PLT 257 220   Cardiac Enzymes: No results for input(s): CKTOTAL, CKMB, CKMBINDEX, TROPONINI in the last 168 hours. BNP: BNP (last 3 results) Recent Labs     04/10/21 1555  BNP 1,007.3*    ProBNP (last 3 results) No results for input(s): PROBNP in the last 8760 hours.  CBG: Recent Labs  Lab 04/11/21 0917 04/11/21 1548 04/15/21 0611  GLUCAP 92 104* 95       Signed:  Domenic Polite MD.  [facility] 04/15/2021, 3:06 PM

## 2021-04-15 NOTE — Progress Notes (Signed)
Mobility Specialist Progress Note:   04/15/21 1100  Mobility  Activity Ambulated with assistance in hallway  Level of Assistance Standby assist, set-up cues, supervision of patient - no hands on  Assistive Device Front wheel walker  Distance Ambulated (ft) 160 ft  Activity Response Tolerated well  $Mobility charge 1 Mobility   Pt agreeable to OOB mobility this am. Does not remember being dizzy after ambulation yesterday, yet no c/o dizziness during today's session. Session performed on 2LO2, pt with no SOB noted. Pt to BR to void at end of session, before back to bed with bed alarm on.   [facility] Numbers [facility] Phone: [phone] Office Phone: [phone]

## 2021-04-18 ENCOUNTER — Telehealth: Payer: Self-pay

## 2021-04-18 ENCOUNTER — Telehealth: Payer: Self-pay | Admitting: *Deleted

## 2021-04-18 DIAGNOSIS — N189 Chronic kidney disease, unspecified: Secondary | ICD-10-CM | POA: Diagnosis not present

## 2021-04-18 DIAGNOSIS — J9601 Acute respiratory failure with hypoxia: Secondary | ICD-10-CM | POA: Diagnosis not present

## 2021-04-18 DIAGNOSIS — N179 Acute kidney failure, unspecified: Secondary | ICD-10-CM | POA: Diagnosis not present

## 2021-04-18 LAB — CUP PACEART REMOTE DEVICE CHECK
Battery Remaining Longevity: 44 mo
Battery Remaining Percentage: 95.5 %
Battery Voltage: 2.98 V
Brady Statistic AP VP Percent: 25 %
Brady Statistic AP VS Percent: 66 %
Brady Statistic AS VP Percent: 1 %
Brady Statistic AS VS Percent: 9.2 %
Brady Statistic RA Percent Paced: 42 %
Brady Statistic RV Percent Paced: 41 %
Date Time Interrogation Session: 20230217223818
Lead Channel Impedance Value: 360 Ohm
Lead Channel Impedance Value: 410 Ohm
Lead Channel Pacing Threshold Amplitude: 1.625 V
Lead Channel Pacing Threshold Amplitude: 3.75 V
Lead Channel Pacing Threshold Pulse Width: 0.4 ms
Lead Channel Pacing Threshold Pulse Width: 1 ms
Lead Channel Sensing Intrinsic Amplitude: 1.2 mV
Lead Channel Sensing Intrinsic Amplitude: 9.4 mV
Lead Channel Setting Pacing Amplitude: 2.5 V
Lead Channel Setting Pacing Amplitude: 5 V
Lead Channel Setting Pacing Pulse Width: 1 ms
Lead Channel Setting Sensing Sensitivity: 2 mV
Pulse Gen Model: 2272
Pulse Gen Serial Number: 3943401

## 2021-04-18 NOTE — Progress Notes (Signed)
Remote pacemaker transmission.   

## 2021-04-18 NOTE — Telephone Encounter (Signed)
-----   Message from Margie Billet, PA-C sent at 04/14/2021  4:51 PM EST ----- Regarding: Pt Appointment Hey can you all make this patient a TOC appointment in the next 2 weeks?  Thanks! Margie Billet, PA-C

## 2021-04-18 NOTE — Telephone Encounter (Signed)
Patient's daughter contacted regarding discharge from [facility] on 04/20/21.  Patient understands to follow up with provider  [place]u on 05/02/21 at 2:45 pm at [facility]. Patient understands discharge instructions? yes  Patient understands medications and regiment? yes  Patient understands to bring all medications to this visit? yes

## 2021-04-18 NOTE — Telephone Encounter (Signed)
With Cardiology Sanda Klein, MD) 05/02/2021 at 10:00 AM

## 2021-04-18 NOTE — Telephone Encounter (Signed)
On-going AFL, mostly CVR. +OAC. Exceeded burden 50%. 1 HVR 01/04 38 sec AFL 2:1 RVR. V auto capture at 5.0V. Most likely due to RVR.  Patient hospitalized 2/12- 2/17, known persistent AF.  D/C instructions indicate f/u with Dr. Sallyanne Kuster in 1 week.    F/U transmission received this morning, pt continues in AF/ Afl.  Outputs have readjusted and device no longer in high output mode.    Spoke with pt daughter.  She reports pt doing ok since being home, she hasn't noticed any issues with the AF.  Advised I would forward info to Dr. Sallyanne Kuster for review and recommendation.    2/20 transmission:

## 2021-04-18 NOTE — Telephone Encounter (Signed)
Patient has an appointment with Dr. Sallyanne Kuster on 05/02/21

## 2021-04-19 DIAGNOSIS — Z7901 Long term (current) use of anticoagulants: Secondary | ICD-10-CM | POA: Diagnosis not present

## 2021-04-19 DIAGNOSIS — F17210 Nicotine dependence, cigarettes, uncomplicated: Secondary | ICD-10-CM | POA: Diagnosis not present

## 2021-04-19 DIAGNOSIS — N1832 Chronic kidney disease, stage 3b: Secondary | ICD-10-CM | POA: Diagnosis not present

## 2021-04-19 DIAGNOSIS — I350 Nonrheumatic aortic (valve) stenosis: Secondary | ICD-10-CM | POA: Diagnosis not present

## 2021-04-19 DIAGNOSIS — Z9181 History of falling: Secondary | ICD-10-CM | POA: Diagnosis not present

## 2021-04-19 DIAGNOSIS — Z7984 Long term (current) use of oral hypoglycemic drugs: Secondary | ICD-10-CM | POA: Diagnosis not present

## 2021-04-19 DIAGNOSIS — E1122 Type 2 diabetes mellitus with diabetic chronic kidney disease: Secondary | ICD-10-CM | POA: Diagnosis not present

## 2021-04-19 DIAGNOSIS — I130 Hypertensive heart and chronic kidney disease with heart failure and stage 1 through stage 4 chronic kidney disease, or unspecified chronic kidney disease: Secondary | ICD-10-CM | POA: Diagnosis not present

## 2021-04-19 DIAGNOSIS — I2510 Atherosclerotic heart disease of native coronary artery without angina pectoris: Secondary | ICD-10-CM | POA: Diagnosis not present

## 2021-04-19 DIAGNOSIS — G2581 Restless legs syndrome: Secondary | ICD-10-CM | POA: Diagnosis not present

## 2021-04-19 DIAGNOSIS — I4892 Unspecified atrial flutter: Secondary | ICD-10-CM | POA: Diagnosis not present

## 2021-04-19 DIAGNOSIS — I509 Heart failure, unspecified: Secondary | ICD-10-CM | POA: Diagnosis not present

## 2021-04-19 DIAGNOSIS — I495 Sick sinus syndrome: Secondary | ICD-10-CM | POA: Diagnosis not present

## 2021-04-19 DIAGNOSIS — D631 Anemia in chronic kidney disease: Secondary | ICD-10-CM | POA: Diagnosis not present

## 2021-04-19 DIAGNOSIS — Z950 Presence of cardiac pacemaker: Secondary | ICD-10-CM | POA: Diagnosis not present

## 2021-04-19 DIAGNOSIS — K219 Gastro-esophageal reflux disease without esophagitis: Secondary | ICD-10-CM | POA: Diagnosis not present

## 2021-04-19 DIAGNOSIS — E785 Hyperlipidemia, unspecified: Secondary | ICD-10-CM | POA: Diagnosis not present

## 2021-04-19 DIAGNOSIS — I480 Paroxysmal atrial fibrillation: Secondary | ICD-10-CM | POA: Diagnosis not present

## 2021-04-20 DIAGNOSIS — N1832 Chronic kidney disease, stage 3b: Secondary | ICD-10-CM | POA: Diagnosis not present

## 2021-04-20 DIAGNOSIS — I130 Hypertensive heart and chronic kidney disease with heart failure and stage 1 through stage 4 chronic kidney disease, or unspecified chronic kidney disease: Secondary | ICD-10-CM | POA: Diagnosis not present

## 2021-04-20 DIAGNOSIS — G2581 Restless legs syndrome: Secondary | ICD-10-CM | POA: Diagnosis not present

## 2021-04-20 DIAGNOSIS — E785 Hyperlipidemia, unspecified: Secondary | ICD-10-CM | POA: Diagnosis not present

## 2021-04-20 DIAGNOSIS — K219 Gastro-esophageal reflux disease without esophagitis: Secondary | ICD-10-CM | POA: Diagnosis not present

## 2021-04-20 DIAGNOSIS — Z9181 History of falling: Secondary | ICD-10-CM | POA: Diagnosis not present

## 2021-04-20 DIAGNOSIS — I2510 Atherosclerotic heart disease of native coronary artery without angina pectoris: Secondary | ICD-10-CM | POA: Diagnosis not present

## 2021-04-20 DIAGNOSIS — Z7984 Long term (current) use of oral hypoglycemic drugs: Secondary | ICD-10-CM | POA: Diagnosis not present

## 2021-04-20 DIAGNOSIS — I495 Sick sinus syndrome: Secondary | ICD-10-CM | POA: Diagnosis not present

## 2021-04-20 DIAGNOSIS — D631 Anemia in chronic kidney disease: Secondary | ICD-10-CM | POA: Diagnosis not present

## 2021-04-20 DIAGNOSIS — I4892 Unspecified atrial flutter: Secondary | ICD-10-CM | POA: Diagnosis not present

## 2021-04-20 DIAGNOSIS — I350 Nonrheumatic aortic (valve) stenosis: Secondary | ICD-10-CM | POA: Diagnosis not present

## 2021-04-20 DIAGNOSIS — E1122 Type 2 diabetes mellitus with diabetic chronic kidney disease: Secondary | ICD-10-CM | POA: Diagnosis not present

## 2021-04-20 DIAGNOSIS — I509 Heart failure, unspecified: Secondary | ICD-10-CM | POA: Diagnosis not present

## 2021-04-20 DIAGNOSIS — Z7901 Long term (current) use of anticoagulants: Secondary | ICD-10-CM | POA: Diagnosis not present

## 2021-04-20 DIAGNOSIS — F17210 Nicotine dependence, cigarettes, uncomplicated: Secondary | ICD-10-CM | POA: Diagnosis not present

## 2021-04-20 DIAGNOSIS — Z950 Presence of cardiac pacemaker: Secondary | ICD-10-CM | POA: Diagnosis not present

## 2021-04-20 DIAGNOSIS — I480 Paroxysmal atrial fibrillation: Secondary | ICD-10-CM | POA: Diagnosis not present

## 2021-04-25 ENCOUNTER — Telehealth: Payer: Self-pay

## 2021-04-25 NOTE — Telephone Encounter (Signed)
Attempted to contact patient's daughter [name] to schedule a Palliative Care consult appointment. No answer left a message to return call.

## 2021-04-26 ENCOUNTER — Telehealth: Payer: Self-pay

## 2021-04-26 DIAGNOSIS — E119 Type 2 diabetes mellitus without complications: Secondary | ICD-10-CM | POA: Diagnosis not present

## 2021-04-26 DIAGNOSIS — E1122 Type 2 diabetes mellitus with diabetic chronic kidney disease: Secondary | ICD-10-CM | POA: Diagnosis not present

## 2021-04-26 DIAGNOSIS — F17210 Nicotine dependence, cigarettes, uncomplicated: Secondary | ICD-10-CM | POA: Diagnosis not present

## 2021-04-26 DIAGNOSIS — I739 Peripheral vascular disease, unspecified: Secondary | ICD-10-CM | POA: Diagnosis not present

## 2021-04-26 DIAGNOSIS — E785 Hyperlipidemia, unspecified: Secondary | ICD-10-CM | POA: Diagnosis not present

## 2021-04-26 DIAGNOSIS — K5900 Constipation, unspecified: Secondary | ICD-10-CM | POA: Diagnosis not present

## 2021-04-26 DIAGNOSIS — N1832 Chronic kidney disease, stage 3b: Secondary | ICD-10-CM | POA: Diagnosis not present

## 2021-04-26 DIAGNOSIS — I480 Paroxysmal atrial fibrillation: Secondary | ICD-10-CM | POA: Diagnosis not present

## 2021-04-26 DIAGNOSIS — I495 Sick sinus syndrome: Secondary | ICD-10-CM | POA: Diagnosis not present

## 2021-04-26 DIAGNOSIS — I4892 Unspecified atrial flutter: Secondary | ICD-10-CM | POA: Diagnosis not present

## 2021-04-26 DIAGNOSIS — I350 Nonrheumatic aortic (valve) stenosis: Secondary | ICD-10-CM | POA: Diagnosis not present

## 2021-04-26 DIAGNOSIS — I130 Hypertensive heart and chronic kidney disease with heart failure and stage 1 through stage 4 chronic kidney disease, or unspecified chronic kidney disease: Secondary | ICD-10-CM | POA: Diagnosis not present

## 2021-04-26 DIAGNOSIS — Z950 Presence of cardiac pacemaker: Secondary | ICD-10-CM | POA: Diagnosis not present

## 2021-04-26 DIAGNOSIS — Z9181 History of falling: Secondary | ICD-10-CM | POA: Diagnosis not present

## 2021-04-26 DIAGNOSIS — K219 Gastro-esophageal reflux disease without esophagitis: Secondary | ICD-10-CM | POA: Diagnosis not present

## 2021-04-26 DIAGNOSIS — I509 Heart failure, unspecified: Secondary | ICD-10-CM | POA: Diagnosis not present

## 2021-04-26 DIAGNOSIS — D513 Other dietary vitamin B12 deficiency anemia: Secondary | ICD-10-CM | POA: Diagnosis not present

## 2021-04-26 DIAGNOSIS — I10 Essential (primary) hypertension: Secondary | ICD-10-CM | POA: Diagnosis not present

## 2021-04-26 DIAGNOSIS — G2581 Restless legs syndrome: Secondary | ICD-10-CM | POA: Diagnosis not present

## 2021-04-26 DIAGNOSIS — Z7901 Long term (current) use of anticoagulants: Secondary | ICD-10-CM | POA: Diagnosis not present

## 2021-04-26 DIAGNOSIS — D631 Anemia in chronic kidney disease: Secondary | ICD-10-CM | POA: Diagnosis not present

## 2021-04-26 DIAGNOSIS — I2510 Atherosclerotic heart disease of native coronary artery without angina pectoris: Secondary | ICD-10-CM | POA: Diagnosis not present

## 2021-04-26 DIAGNOSIS — Z7984 Long term (current) use of oral hypoglycemic drugs: Secondary | ICD-10-CM | POA: Diagnosis not present

## 2021-04-26 NOTE — Telephone Encounter (Signed)
Spoke with patient's daughter [name] and scheduled a FaceTime Palliative Consult for 05/02/21 @ 12:30PM  Consent obtained; updated Outlook/Netsmart/Team List and Epic.

## 2021-04-28 DIAGNOSIS — I350 Nonrheumatic aortic (valve) stenosis: Secondary | ICD-10-CM | POA: Diagnosis not present

## 2021-04-28 DIAGNOSIS — I480 Paroxysmal atrial fibrillation: Secondary | ICD-10-CM | POA: Diagnosis not present

## 2021-04-28 DIAGNOSIS — F17210 Nicotine dependence, cigarettes, uncomplicated: Secondary | ICD-10-CM | POA: Diagnosis not present

## 2021-04-28 DIAGNOSIS — I2510 Atherosclerotic heart disease of native coronary artery without angina pectoris: Secondary | ICD-10-CM | POA: Diagnosis not present

## 2021-04-28 DIAGNOSIS — I509 Heart failure, unspecified: Secondary | ICD-10-CM | POA: Diagnosis not present

## 2021-04-28 DIAGNOSIS — G2581 Restless legs syndrome: Secondary | ICD-10-CM | POA: Diagnosis not present

## 2021-04-28 DIAGNOSIS — Z9181 History of falling: Secondary | ICD-10-CM | POA: Diagnosis not present

## 2021-04-28 DIAGNOSIS — I495 Sick sinus syndrome: Secondary | ICD-10-CM | POA: Diagnosis not present

## 2021-04-28 DIAGNOSIS — E785 Hyperlipidemia, unspecified: Secondary | ICD-10-CM | POA: Diagnosis not present

## 2021-04-28 DIAGNOSIS — K219 Gastro-esophageal reflux disease without esophagitis: Secondary | ICD-10-CM | POA: Diagnosis not present

## 2021-04-28 DIAGNOSIS — Z950 Presence of cardiac pacemaker: Secondary | ICD-10-CM | POA: Diagnosis not present

## 2021-04-28 DIAGNOSIS — Z7901 Long term (current) use of anticoagulants: Secondary | ICD-10-CM | POA: Diagnosis not present

## 2021-04-28 DIAGNOSIS — Z7984 Long term (current) use of oral hypoglycemic drugs: Secondary | ICD-10-CM | POA: Diagnosis not present

## 2021-04-28 DIAGNOSIS — I4892 Unspecified atrial flutter: Secondary | ICD-10-CM | POA: Diagnosis not present

## 2021-04-28 DIAGNOSIS — D631 Anemia in chronic kidney disease: Secondary | ICD-10-CM | POA: Diagnosis not present

## 2021-04-28 DIAGNOSIS — E1122 Type 2 diabetes mellitus with diabetic chronic kidney disease: Secondary | ICD-10-CM | POA: Diagnosis not present

## 2021-04-28 DIAGNOSIS — N1832 Chronic kidney disease, stage 3b: Secondary | ICD-10-CM | POA: Diagnosis not present

## 2021-04-28 DIAGNOSIS — I130 Hypertensive heart and chronic kidney disease with heart failure and stage 1 through stage 4 chronic kidney disease, or unspecified chronic kidney disease: Secondary | ICD-10-CM | POA: Diagnosis not present

## 2021-04-29 DIAGNOSIS — E1122 Type 2 diabetes mellitus with diabetic chronic kidney disease: Secondary | ICD-10-CM | POA: Diagnosis not present

## 2021-04-29 DIAGNOSIS — I495 Sick sinus syndrome: Secondary | ICD-10-CM | POA: Diagnosis not present

## 2021-04-29 DIAGNOSIS — Z950 Presence of cardiac pacemaker: Secondary | ICD-10-CM | POA: Diagnosis not present

## 2021-04-29 DIAGNOSIS — N1832 Chronic kidney disease, stage 3b: Secondary | ICD-10-CM | POA: Diagnosis not present

## 2021-04-29 DIAGNOSIS — D631 Anemia in chronic kidney disease: Secondary | ICD-10-CM | POA: Diagnosis not present

## 2021-04-29 DIAGNOSIS — I480 Paroxysmal atrial fibrillation: Secondary | ICD-10-CM | POA: Diagnosis not present

## 2021-04-29 DIAGNOSIS — I130 Hypertensive heart and chronic kidney disease with heart failure and stage 1 through stage 4 chronic kidney disease, or unspecified chronic kidney disease: Secondary | ICD-10-CM | POA: Diagnosis not present

## 2021-04-29 DIAGNOSIS — I4892 Unspecified atrial flutter: Secondary | ICD-10-CM | POA: Diagnosis not present

## 2021-04-29 DIAGNOSIS — Z7984 Long term (current) use of oral hypoglycemic drugs: Secondary | ICD-10-CM | POA: Diagnosis not present

## 2021-04-29 DIAGNOSIS — I350 Nonrheumatic aortic (valve) stenosis: Secondary | ICD-10-CM | POA: Diagnosis not present

## 2021-04-29 DIAGNOSIS — I509 Heart failure, unspecified: Secondary | ICD-10-CM | POA: Diagnosis not present

## 2021-04-29 DIAGNOSIS — I2510 Atherosclerotic heart disease of native coronary artery without angina pectoris: Secondary | ICD-10-CM | POA: Diagnosis not present

## 2021-04-29 DIAGNOSIS — E785 Hyperlipidemia, unspecified: Secondary | ICD-10-CM | POA: Diagnosis not present

## 2021-04-29 DIAGNOSIS — Z9181 History of falling: Secondary | ICD-10-CM | POA: Diagnosis not present

## 2021-04-29 DIAGNOSIS — K219 Gastro-esophageal reflux disease without esophagitis: Secondary | ICD-10-CM | POA: Diagnosis not present

## 2021-04-29 DIAGNOSIS — F17210 Nicotine dependence, cigarettes, uncomplicated: Secondary | ICD-10-CM | POA: Diagnosis not present

## 2021-04-29 DIAGNOSIS — G2581 Restless legs syndrome: Secondary | ICD-10-CM | POA: Diagnosis not present

## 2021-04-29 DIAGNOSIS — Z7901 Long term (current) use of anticoagulants: Secondary | ICD-10-CM | POA: Diagnosis not present

## 2021-05-02 ENCOUNTER — Encounter: Payer: Self-pay | Admitting: Internal Medicine

## 2021-05-02 ENCOUNTER — Ambulatory Visit (INDEPENDENT_AMBULATORY_CARE_PROVIDER_SITE_OTHER): Payer: Medicare Other | Admitting: Cardiovascular Disease

## 2021-05-02 ENCOUNTER — Encounter: Payer: Self-pay | Admitting: Cardiovascular Disease

## 2021-05-02 ENCOUNTER — Other Ambulatory Visit: Payer: Medicare Other | Admitting: Internal Medicine

## 2021-05-02 ENCOUNTER — Other Ambulatory Visit: Payer: Self-pay

## 2021-05-02 VITALS — BP 126/73 | HR 74 | Ht 64.0 in | Wt 138.6 lb

## 2021-05-02 DIAGNOSIS — N1832 Chronic kidney disease, stage 3b: Secondary | ICD-10-CM

## 2021-05-02 DIAGNOSIS — N179 Acute kidney failure, unspecified: Secondary | ICD-10-CM | POA: Diagnosis not present

## 2021-05-02 DIAGNOSIS — D6869 Other thrombophilia: Secondary | ICD-10-CM

## 2021-05-02 DIAGNOSIS — Z950 Presence of cardiac pacemaker: Secondary | ICD-10-CM

## 2021-05-02 DIAGNOSIS — I2510 Atherosclerotic heart disease of native coronary artery without angina pectoris: Secondary | ICD-10-CM

## 2021-05-02 DIAGNOSIS — R410 Disorientation, unspecified: Secondary | ICD-10-CM | POA: Diagnosis not present

## 2021-05-02 DIAGNOSIS — N183 Chronic kidney disease, stage 3 unspecified: Secondary | ICD-10-CM

## 2021-05-02 DIAGNOSIS — I5032 Chronic diastolic (congestive) heart failure: Secondary | ICD-10-CM | POA: Diagnosis not present

## 2021-05-02 DIAGNOSIS — I4891 Unspecified atrial fibrillation: Secondary | ICD-10-CM | POA: Diagnosis not present

## 2021-05-02 DIAGNOSIS — J90 Pleural effusion, not elsewhere classified: Secondary | ICD-10-CM

## 2021-05-02 DIAGNOSIS — I350 Nonrheumatic aortic (valve) stenosis: Secondary | ICD-10-CM

## 2021-05-02 DIAGNOSIS — I10 Essential (primary) hypertension: Secondary | ICD-10-CM | POA: Diagnosis not present

## 2021-05-02 DIAGNOSIS — I4819 Other persistent atrial fibrillation: Secondary | ICD-10-CM | POA: Diagnosis not present

## 2021-05-02 DIAGNOSIS — E7801 Familial hypercholesterolemia: Secondary | ICD-10-CM

## 2021-05-02 DIAGNOSIS — Z953 Presence of xenogenic heart valve: Secondary | ICD-10-CM

## 2021-05-02 DIAGNOSIS — R630 Anorexia: Secondary | ICD-10-CM

## 2021-05-02 DIAGNOSIS — Z515 Encounter for palliative care: Secondary | ICD-10-CM

## 2021-05-02 DIAGNOSIS — T8209XD Other mechanical complication of heart valve prosthesis, subsequent encounter: Secondary | ICD-10-CM | POA: Diagnosis not present

## 2021-05-02 DIAGNOSIS — I495 Sick sinus syndrome: Secondary | ICD-10-CM | POA: Diagnosis not present

## 2021-05-02 DIAGNOSIS — E1122 Type 2 diabetes mellitus with diabetic chronic kidney disease: Secondary | ICD-10-CM | POA: Diagnosis not present

## 2021-05-02 DIAGNOSIS — J9601 Acute respiratory failure with hypoxia: Secondary | ICD-10-CM | POA: Diagnosis not present

## 2021-05-02 MED ORDER — FUROSEMIDE 20 MG PO TABS: 20.0000 mg | ORAL_TABLET | Freq: Every day | ORAL | 6 refills | Status: DC

## 2021-05-02 NOTE — Progress Notes (Signed)
[facility] Consult Note Telephone: [phone]  Fax: [phone]   Date of encounter: 05/02/21 8:32 AM PATIENT NAME: [name] [address] [place] [address]   [phone] (home)  DOB: 07/24/1929 MRN: [id number] PRIMARY CARE PROVIDER:    Imagene Riches, NP,  [place] [address] [phone]  REFERRING PROVIDER:   Imagene Riches, NP [place],  [place] [address] [phone]  RESPONSIBLE PARTY:    Contact Information     Name Relation Home Work [place] Daughter   [phone]   Lazarus Salines Daughter   [phone]       Due to the COVID-19 crisis, this visit was done via telemedicine from my office and it was initiated and consent by this patient and or family.  I connected with  Glenice Bow OR PROXY on 05/02/21 by a video enabled telemedicine application and verified that I am speaking with the correct person using two identifiers.   I discussed the limitations of evaluation and management by telemedicine. The patient expressed understanding and agreed to proceed.    Palliative Care was asked to follow this patient by consultation request of  York, Regina F, NP to address advance care planning and complex medical decision making. This is the initial visit.                                     ASSESSMENT AND PLAN / RECOMMENDATIONS:   Advance Care Planning/Goals of Care: Goals include to maximize quality of life and symptom management. Patient/health care surrogate gave his/her permission to discuss.Our advance care planning conversation included a discussion about:    The value and importance of advance care planning  Experiences with loved ones who have been seriously ill or have died  Exploration of personal, cultural or spiritual beliefs that might influence medical decisions  Exploration of goals of care in the event of a sudden injury or illness  Identification   of a healthcare agent--Teresa is her HCPOA Review and updating or creation of an  advance directive document . Decision not to resuscitate or to de-escalate disease focused treatments due to poor prognosis. CODE STATUS:  DNR 04/10/21 done at [facility] [place] office She has lived with [name] for 86 yrs and will be through the end of her life.   Will do MOST at April f/u visit 4/11 @ 1230  Symptom Management/Plan: 1. Chronic diastolic CHF (congestive heart failure) (HCC) -has gained a couple of lbs, but was dry at discharge, just seen by cardiology this am and new diuretic orders provided--Teresa feels comfortable with plan now -continue to monitor for weight gain, sob, and edema  2. Nonrheumatic aortic valve stenosis -not an operative candidate with recurrence at advanced age and frailty  3. History of aortic valve replacement with bioprosthetic valve -valve now with severe stenosis -must be careful with diuresis in view of this  4. Pleural effusion, bilateral -during admission, monitor carefully at home, I'll see her in person in April  5. Atrial fibrillation with RVR (HCC) -was challenging to control with fairly high dose metoprolol and diltiazem, has pacemaker, as well, monitor HR  6. Acute respiratory failure with hypoxia (HCC) -cont hs O2 if she will wear--she slept better with this per her daughter  7. Delirium -seems to be resolved, ? Any baseline dementia given paranoia, monitor  8. Palliative  care by specialist -will plan on MOST in person next month  9.  Loss of appetite -educated her daughter, [name], about this being an effect of heart failure at times (plus advanced age/frailty) -continue to encourage small frequent meals/snacks, try adding ice cream, peanut butter to supplement hakes to improve texture/flavor  Follow up Palliative Care Visit: Palliative care will continue to follow for complex medical decision making, advance care planning, and clarification of goals. F/u in  home 4/11 at 12:30pm.  This visit was coded based on medical decision making (MDM).  PPS: 40%  HOSPICE ELIGIBILITY/DIAGNOSIS: TBD/chf  Chief Complaint: Initial palliative care video consult  HISTORY OF PRESENT ILLNESS:  CORRA KAINE is a 88 y.o. year old female with chronic diastolic heart failure, aortic stenosis with bioprosthetic valve, persistent atrial fib on eliquis, CAD, iron deficiency anemia, chronic kidney disease stage 3B, htn, and DMII with hyperlipidemia.  She was recently admitted to the hospital 2/12-17 with increased sob for two weeks.  She was noted to be hypoxic to 80s requiring 3L via [place], hypertensive to 160s/113, CXR with b/l pleural effusions, interstitial edema, BNP>1000, creatinine 1.6; echo noted preserved EF but significant stenosis of bioprosthetic aortic valve.  She was diuresed with IV lasix (down 5L) and appeared euvolemic at d/c.  She was transitioned to po lasix but cr bumped and diuretics were held x 2 days, then resumed at lasix 84m daily when wt >138 lbs and 457mdaily if weight <138lbs.  She was noted to have delirium and poor prognosis during admission, hence our involvement in her care.  She has a pacemaker for her afib with RVR--on metoprolol and diltiazem.  She was continued on iron for her anemia.    She was just at her appt with Dr. C.Loletha Grayer She was stable.  TeHelene Kelpotes her weights have varied by as much 4 lbs.  She's been feeling good.  She does not complain.  Has not reported shortness of breath.  She's been talking better.  She is doing PT and might be more dyspneic with that.  She gets anxious and paranoid about new things with her care.  She slept really well with oxygen the first two nights.  She thought she smelled something wearing the oxygen.  When seen at Dr. C'Lurline Delffice this am, she was instructed to take 2027masix daily then 52m47m weight is over 140 lbs.    She has "overcome" her delirium.  She historically had gotten people she didn't recently  see mixed up, but nothing like during hospitalization.  Paranoia is new.    She does get hungry but barely eats.  She is now enrolled in meals on wheels. She will eat half a sandwich, biscuit, etc. Generally 50% of meals.  She has only finished one shake--doesn't drink them.  She will eat ice cream sandwiches, fruit yogurt, loves cottage cheese, mandarin oranges.  She does like jif peanut butter.    No skin breakdown.    Moves bowel ok.  Goes every other or every 2 days.  Is continent of bowel and bladder.  Does wear depends in case.    She has not fallen since hospital discharge, but prior falls with stitches in head.    History obtained from review of EMR, discussion with primary team, and interview with family, facility staff/caregiver and/or Ms. TillWynonia LawmanI reviewed available labs, medications, imaging, studies and related documents from the EMR.  Records reviewed and summarized above.   ROS  General: NAD EYES: denies vision changes, wears glasses ENMT: denies dysphagia Cardiovascular: denies chest pain, denies DOE but not exerting much thus far, just starting with some home health therapy Pulmonary: denies cough, denies increased SOB Abdomen: endorses poor appetite and eating about 50% of meals, denies constipation--goes every other to every 2 days, endorses continence of bowel GU: denies dysuria, endorses some incontinence of urine MSK:  has increased weakness,  no falls reported, uses walker Skin: denies rashes or wounds Neurological: denies pain, denies insomnia Psych: Endorses positive mood Heme/lymph/immuno: denies bruises, abnormal bleeding  Physical Exam: Current and past weights:  138.6 lbs today at cardiology, up from 135.58 lbs on 2/17 at time of hospital d/c (but notably hypovolemic then per renal labs) Constitutional: NAD General: frail appearing, WNWD EYES: anicteric sclera, lids intact, no discharge , glasses ENMT: intact hearing, oral mucous membranes moist,  dentition intact CV: no LE edema Pulmonary: no increased work of breathing, no cough, room air, but using O2 at hs off and on (had some paranoia about odor when wearing so didn't wear last night) Abdomen: intake 50% at best, does not like supplement shakes, no ascites GU: deferred MSK: some sarcopenia, moves all extremities, ambulatory with walker but weak Skin: warm and dry, no rashes or wounds on visible skin Neuro:  no generalized weakness,  no cognitive impairment at baseline reported  Psych: non-anxious affect, A and O x 3 Hem/lymph/immuno: no widespread bruising  CURRENT PROBLEM LIST:  Patient Active Problem List   Diagnosis Date Noted   Aortic stenosis 04/14/2021   Acute respiratory failure with hypoxia ([place]) 04/11/2021   Acute kidney injury superimposed on chronic kidney disease ([place]) 04/11/2021   Iron deficiency anemia 04/11/2021   Acute CHF (congestive heart failure) ([place]) 04/10/2021   Pacemaker battery depletion 10/11/2020   History of aortic valve replacement with bioprosthetic valve 07/23/2016   Pleural effusion, bilateral    Acute congestive heart failure ([place])    Atrial fibrillation with RVR ([place])    Hypoxia 12/14/2015   Hearing loss 09/02/2015   Restless leg syndrome 09/02/2015   CKD stage 3 due to type 2 diabetes mellitus ([place]) 06/15/2015   SSS (sick sinus syndrome) ([place]) 03/22/2015   Presbycusis of both ears 03/10/2015   Malignant neoplasm skin of face 03/10/2015   Encounter for chronic pain management 06/02/2014   Recurrent falls 06/02/2014   S/P AVR (aortic valve replacement) 05/17/2014   Typical atrial flutter ([place]) 05/01/2014   Constipation 02/10/2014   Allergic rhinitis 08/06/2013   Chronic pain syndrome 05/22/2013   Insomnia 03/13/2013   Dry skin dermatitis 03/13/2013   Pacemaker 01/08/2013   Mild nonproliferative diabetic retinopathy(362.04) 12/05/2012   Persistent atrial fibrillation ([place]) 06/14/2012   Long term (current) use of anticoagulants  06/14/2012   TOBACCO USER 03/01/2009   OSTEOPENIA 08/10/2008   B12 DEFICIENCY 08/07/2008   GERD 08/07/2008   Essential hypertension, benign 03/26/2008   UNSPECIFIED PROBLEMS W/LIMBS AND OTHER PROBLEMS 02/26/2008   Hyperlipidemia associated with type 2 diabetes mellitus ([place]) 10/23/2007   ACTINIC KERATOSIS 10/23/2007   OSTEOARTHRITIS, KNEES, BILATERAL 10/23/2007   ANXIETY DEPRESSION 08/20/2007   CAD (coronary artery disease) 04/05/2007   Anemia 09/12/2006   Type 2 diabetes mellitus with hyperlipidemia ([place]) 05/08/2006   Congestive heart failure ([place]) 05/08/2006   PAST MEDICAL HISTORY:  Active Ambulatory Problems    Diagnosis Date Noted   Type 2 diabetes mellitus with hyperlipidemia ([place]) 05/08/2006   B12 DEFICIENCY 08/07/2008   Hyperlipidemia associated with type 2 diabetes mellitus (  [place]) 10/23/2007   Anemia 09/12/2006   ANXIETY DEPRESSION 08/20/2007   TOBACCO USER 03/01/2009   Essential hypertension, benign 03/26/2008   CAD (coronary artery disease) 04/05/2007   Congestive heart failure ([place]) 05/08/2006   GERD 08/07/2008   ACTINIC KERATOSIS 10/23/2007   OSTEOARTHRITIS, KNEES, BILATERAL 10/23/2007   OSTEOPENIA 08/10/2008   UNSPECIFIED PROBLEMS W/LIMBS AND OTHER PROBLEMS 02/26/2008   Persistent atrial fibrillation ([place]) 06/14/2012   Long term (current) use of anticoagulants 06/14/2012   Mild nonproliferative diabetic retinopathy(362.04) 12/05/2012   Pacemaker 01/08/2013   Insomnia 03/13/2013   Dry skin dermatitis 03/13/2013   Chronic pain syndrome 05/22/2013   Allergic rhinitis 08/06/2013   Constipation 02/10/2014   Typical atrial flutter ([place]) 05/01/2014   S/P AVR (aortic valve replacement) 05/17/2014   Encounter for chronic pain management 06/02/2014   Recurrent falls 06/02/2014   Presbycusis of both ears 03/10/2015   Malignant neoplasm skin of face 03/10/2015   SSS (sick sinus syndrome) ([place]) 03/22/2015   CKD stage 3 due to type 2 diabetes mellitus ([place]) 06/15/2015    Hearing loss 09/02/2015   Restless leg syndrome 09/02/2015   Hypoxia 12/14/2015   Acute congestive heart failure (HCC)    Atrial fibrillation with RVR (HCC)    Pleural effusion, bilateral    History of aortic valve replacement with bioprosthetic valve 07/23/2016   Pacemaker battery depletion 10/11/2020   Acute CHF (congestive heart failure) ([place]) 04/10/2021   Acute respiratory failure with hypoxia ([place]) 04/11/2021   Acute kidney injury superimposed on chronic kidney disease ([place]) 04/11/2021   Iron deficiency anemia 04/11/2021   Aortic stenosis 04/14/2021   Resolved Ambulatory Problems    Diagnosis Date Noted   RESTLESS LEG SYNDROME 11/22/2007   GAIT IMBALANCE 41/42/3953   [facility] COMPLICATION DUE HEART VALVE PROSTHESIS 11/22/2007   Seasonal allergies 06/05/2010   Dysuria 02/12/2011   Major depression 07/25/2011   Congestion of upper airway 03/10/2012   Pacemaker 01/08/2013   Sinusitis, acute, maxillary 04/02/2014   UTI (urinary tract infection) 09/02/2014   Past Medical History:  Diagnosis Date   Anxiety    CHF (congestive heart failure) (HCC)    DM2 (diabetes mellitus, type 2) (HCC)    GERD (gastroesophageal reflux disease)    HLD (hyperlipidemia)    HTN (hypertension)    PAF (paroxysmal atrial fibrillation) (HCC)    Tobacco abuse    SOCIAL HX:  Social History   Tobacco Use   Smoking status: Never   Smokeless tobacco: Current    Types: Snuff  Substance Use Topics   Alcohol use: No    Alcohol/week: 0.0 standard drinks   FAMILY HX:  Family History  Problem Relation Age of Onset   Heart attack Brother    Kidney disease Sister       ALLERGIES:  Allergies  Allergen Reactions   Escitalopram     Insomnia / paranoid    Pravachol [Pravastatin Sodium] Other (See Comments)    myalgias   Tape Other (See Comments)    Patient states that she has skin cancer and that it is thin (PLEASE USE COBAN WRAP INSTEAD OF TAPE!)     PERTINENT MEDICATIONS:  Outpatient  Encounter Medications as of 05/02/2021  Medication Sig   ALPRAZolam (XANAX) 1 MG tablet Take 0.5 tablets (0.5 mg total) by mouth 2 (two) times daily as needed for anxiety. (Patient taking differently: Take 1 mg by mouth at bedtime as needed for anxiety.)   apixaban (ELIQUIS) 2.5 MG TABS tablet TAKE 1 TABLET (2.5 MG  TOTAL) BY MOUTH 2 (TWO) TIMES DAILY. (Patient taking differently: Take 2.5 mg by mouth 2 (two) times daily.)   Blood Glucose Monitoring Suppl (ONE TOUCH ULTRA 2) W/DEVICE KIT    Cyanocobalamin (VITAMIN DEFICIENCY SYSTEM-B12 IJ) Inject 1 Dose as directed every 30 (thirty) days. Not sure about dosage   diltiazem (CARDIZEM CD) 240 MG 24 hr capsule TAKE 1 CAPSULE (240 MG TOTAL) BY MOUTH DAILY. NEED OFFICE VISIT (Patient taking differently: 240 mg daily.)   Evolocumab (REPATHA SURECLICK) 629 MG/ML SOAJ INJECT 140 MG INTO THE SKIN EVERY 14 (FOURTEEN) DAYS.   ezetimibe (ZETIA) 10 MG tablet TAKE 1 TABLET (10 MG TOTAL) BY MOUTH DAILY. (Patient not taking: Reported on 10/08/2020)   ferrous sulfate 325 (65 FE) MG tablet Take 1 tablet (325 mg total) by mouth daily with breakfast.   fluticasone (FLONASE) 50 MCG/ACT nasal spray Place 1 spray into both nostrils daily as needed for allergies.   furosemide (LASIX) 40 MG tablet Take 1-2 tablets (40-80 mg total) by mouth daily. Take 29m daily when weight is<138lbs and take 812mdaily when weight is >138lbs   linaclotide (LINZESS) 145 MCG CAPS capsule Take 145 mcg by mouth daily as needed (constipation).   metFORMIN (GLUCOPHAGE) 500 MG tablet TAKE 1 TABLET (500 MG TOTAL) BY MOUTH 2 (TWO) TIMES DAILY WITH A MEAL.   metoprolol tartrate (LOPRESSOR) 100 MG tablet Take 1 tablet (100 mg total) by mouth 2 (two) times daily.   omeprazole (PRILOSEC) 20 MG capsule TAKE 1 CAPSULE BY MOUTH EVERY DAY (Patient taking differently: 20 mg daily.)   ONE TOUCH ULTRA TEST test strip TEST BLOOD SUGAR TWICE A DAY   potassium chloride SA (KLOR-CON M) 20 MEQ tablet Take 2 tablets (40  mEq total) by mouth daily.   No facility-administered encounter medications on file as of 05/02/2021.   Thank you for the opportunity to participate in the care of Ms. TiWynonia Lawman The palliative care team will continue to follow. Please call our office at [phone] we can be of additional assistance.   TiHollace KinnierDO   COVID-19 PATIENT SCREENING TOOL Asked and negative response unless otherwise noted:  Have you had symptoms of covid, tested positive or been in contact with someone with symptoms/positive test in the past 5-10 days?

## 2021-05-02 NOTE — Patient Instructions (Signed)
Medication Instructions:  ?Take Furosemide 20 mg once daily. Take 40 mg for a weight over 140 lbs ? ?*If you need a refill on your cardiac medications before your next appointment, please call your pharmacy* ? ? ?Lab Work: ?None ordered ?If you have labs (blood work) drawn today and your tests are completely normal, you will receive your results only by: ?MyChart Message (if you have MyChart) OR ?A paper copy in the mail ?If you have any lab test that is abnormal or we need to change your treatment, we will call you to review the results. ? ? ?Testing/Procedures: ?None ordered ? ? ?Follow-Up: ?At [facility], you and your health needs are our priority.  As part of our continuing mission to provide you with exceptional heart care, we have created designated Provider Care Teams.  These Care Teams include your primary Cardiologist (physician) and Advanced Practice Providers (APPs -  Physician Assistants and Nurse Practitioners) who all work together to provide you with the care you need, when you need it. ? ?We recommend signing up for the patient portal called "MyChart".  Sign up information is provided on this After Visit Summary.  MyChart is used to connect with patients for Virtual Visits (Telemedicine).  Patients are able to view lab/test results, encounter notes, upcoming appointments, etc.  Non-urgent messages can be sent to your provider as well.   ?To learn more about what you can do with MyChart, go to [email or web address].   ? ?Your next appointment:   ?6 month(s) ? ?The format for your next appointment:   ?In Person ? ?Provider:   ?Sanda Klein, MD { ? ? ?

## 2021-05-03 DIAGNOSIS — E1122 Type 2 diabetes mellitus with diabetic chronic kidney disease: Secondary | ICD-10-CM | POA: Diagnosis not present

## 2021-05-03 DIAGNOSIS — F17210 Nicotine dependence, cigarettes, uncomplicated: Secondary | ICD-10-CM | POA: Diagnosis not present

## 2021-05-03 DIAGNOSIS — N1832 Chronic kidney disease, stage 3b: Secondary | ICD-10-CM | POA: Diagnosis not present

## 2021-05-03 DIAGNOSIS — I4892 Unspecified atrial flutter: Secondary | ICD-10-CM | POA: Diagnosis not present

## 2021-05-03 DIAGNOSIS — K219 Gastro-esophageal reflux disease without esophagitis: Secondary | ICD-10-CM | POA: Diagnosis not present

## 2021-05-03 DIAGNOSIS — I495 Sick sinus syndrome: Secondary | ICD-10-CM | POA: Diagnosis not present

## 2021-05-03 DIAGNOSIS — Z9181 History of falling: Secondary | ICD-10-CM | POA: Diagnosis not present

## 2021-05-03 DIAGNOSIS — Z7901 Long term (current) use of anticoagulants: Secondary | ICD-10-CM | POA: Diagnosis not present

## 2021-05-03 DIAGNOSIS — Z950 Presence of cardiac pacemaker: Secondary | ICD-10-CM | POA: Diagnosis not present

## 2021-05-03 DIAGNOSIS — Z7984 Long term (current) use of oral hypoglycemic drugs: Secondary | ICD-10-CM | POA: Diagnosis not present

## 2021-05-03 DIAGNOSIS — I480 Paroxysmal atrial fibrillation: Secondary | ICD-10-CM | POA: Diagnosis not present

## 2021-05-03 DIAGNOSIS — D631 Anemia in chronic kidney disease: Secondary | ICD-10-CM | POA: Diagnosis not present

## 2021-05-03 DIAGNOSIS — I509 Heart failure, unspecified: Secondary | ICD-10-CM | POA: Diagnosis not present

## 2021-05-03 DIAGNOSIS — G2581 Restless legs syndrome: Secondary | ICD-10-CM | POA: Diagnosis not present

## 2021-05-03 DIAGNOSIS — I130 Hypertensive heart and chronic kidney disease with heart failure and stage 1 through stage 4 chronic kidney disease, or unspecified chronic kidney disease: Secondary | ICD-10-CM | POA: Diagnosis not present

## 2021-05-03 DIAGNOSIS — E785 Hyperlipidemia, unspecified: Secondary | ICD-10-CM | POA: Diagnosis not present

## 2021-05-03 DIAGNOSIS — I350 Nonrheumatic aortic (valve) stenosis: Secondary | ICD-10-CM | POA: Diagnosis not present

## 2021-05-03 DIAGNOSIS — I2510 Atherosclerotic heart disease of native coronary artery without angina pectoris: Secondary | ICD-10-CM | POA: Diagnosis not present

## 2021-05-04 DIAGNOSIS — I130 Hypertensive heart and chronic kidney disease with heart failure and stage 1 through stage 4 chronic kidney disease, or unspecified chronic kidney disease: Secondary | ICD-10-CM | POA: Diagnosis not present

## 2021-05-04 DIAGNOSIS — Z7984 Long term (current) use of oral hypoglycemic drugs: Secondary | ICD-10-CM | POA: Diagnosis not present

## 2021-05-04 DIAGNOSIS — I2510 Atherosclerotic heart disease of native coronary artery without angina pectoris: Secondary | ICD-10-CM | POA: Diagnosis not present

## 2021-05-04 DIAGNOSIS — I509 Heart failure, unspecified: Secondary | ICD-10-CM | POA: Diagnosis not present

## 2021-05-04 DIAGNOSIS — I4892 Unspecified atrial flutter: Secondary | ICD-10-CM | POA: Diagnosis not present

## 2021-05-04 DIAGNOSIS — I350 Nonrheumatic aortic (valve) stenosis: Secondary | ICD-10-CM | POA: Diagnosis not present

## 2021-05-04 DIAGNOSIS — E785 Hyperlipidemia, unspecified: Secondary | ICD-10-CM | POA: Diagnosis not present

## 2021-05-04 DIAGNOSIS — I480 Paroxysmal atrial fibrillation: Secondary | ICD-10-CM | POA: Diagnosis not present

## 2021-05-04 DIAGNOSIS — E1122 Type 2 diabetes mellitus with diabetic chronic kidney disease: Secondary | ICD-10-CM | POA: Diagnosis not present

## 2021-05-04 DIAGNOSIS — Z950 Presence of cardiac pacemaker: Secondary | ICD-10-CM | POA: Diagnosis not present

## 2021-05-04 DIAGNOSIS — Z9181 History of falling: Secondary | ICD-10-CM | POA: Diagnosis not present

## 2021-05-04 DIAGNOSIS — N1832 Chronic kidney disease, stage 3b: Secondary | ICD-10-CM | POA: Diagnosis not present

## 2021-05-04 DIAGNOSIS — Z7901 Long term (current) use of anticoagulants: Secondary | ICD-10-CM | POA: Diagnosis not present

## 2021-05-04 DIAGNOSIS — K219 Gastro-esophageal reflux disease without esophagitis: Secondary | ICD-10-CM | POA: Diagnosis not present

## 2021-05-04 DIAGNOSIS — D631 Anemia in chronic kidney disease: Secondary | ICD-10-CM | POA: Diagnosis not present

## 2021-05-04 DIAGNOSIS — G2581 Restless legs syndrome: Secondary | ICD-10-CM | POA: Diagnosis not present

## 2021-05-04 DIAGNOSIS — I495 Sick sinus syndrome: Secondary | ICD-10-CM | POA: Diagnosis not present

## 2021-05-04 DIAGNOSIS — F17210 Nicotine dependence, cigarettes, uncomplicated: Secondary | ICD-10-CM | POA: Diagnosis not present

## 2021-05-06 DIAGNOSIS — K219 Gastro-esophageal reflux disease without esophagitis: Secondary | ICD-10-CM | POA: Diagnosis not present

## 2021-05-06 DIAGNOSIS — Z7901 Long term (current) use of anticoagulants: Secondary | ICD-10-CM | POA: Diagnosis not present

## 2021-05-06 DIAGNOSIS — E1122 Type 2 diabetes mellitus with diabetic chronic kidney disease: Secondary | ICD-10-CM | POA: Diagnosis not present

## 2021-05-06 DIAGNOSIS — E785 Hyperlipidemia, unspecified: Secondary | ICD-10-CM | POA: Diagnosis not present

## 2021-05-06 DIAGNOSIS — I509 Heart failure, unspecified: Secondary | ICD-10-CM | POA: Diagnosis not present

## 2021-05-06 DIAGNOSIS — I480 Paroxysmal atrial fibrillation: Secondary | ICD-10-CM | POA: Diagnosis not present

## 2021-05-06 DIAGNOSIS — I2510 Atherosclerotic heart disease of native coronary artery without angina pectoris: Secondary | ICD-10-CM | POA: Diagnosis not present

## 2021-05-06 DIAGNOSIS — I350 Nonrheumatic aortic (valve) stenosis: Secondary | ICD-10-CM | POA: Diagnosis not present

## 2021-05-06 DIAGNOSIS — I495 Sick sinus syndrome: Secondary | ICD-10-CM | POA: Diagnosis not present

## 2021-05-06 DIAGNOSIS — I4892 Unspecified atrial flutter: Secondary | ICD-10-CM | POA: Diagnosis not present

## 2021-05-06 DIAGNOSIS — F17210 Nicotine dependence, cigarettes, uncomplicated: Secondary | ICD-10-CM | POA: Diagnosis not present

## 2021-05-06 DIAGNOSIS — Z7984 Long term (current) use of oral hypoglycemic drugs: Secondary | ICD-10-CM | POA: Diagnosis not present

## 2021-05-06 DIAGNOSIS — D631 Anemia in chronic kidney disease: Secondary | ICD-10-CM | POA: Diagnosis not present

## 2021-05-06 DIAGNOSIS — N1832 Chronic kidney disease, stage 3b: Secondary | ICD-10-CM | POA: Diagnosis not present

## 2021-05-06 DIAGNOSIS — Z9181 History of falling: Secondary | ICD-10-CM | POA: Diagnosis not present

## 2021-05-06 DIAGNOSIS — I130 Hypertensive heart and chronic kidney disease with heart failure and stage 1 through stage 4 chronic kidney disease, or unspecified chronic kidney disease: Secondary | ICD-10-CM | POA: Diagnosis not present

## 2021-05-06 DIAGNOSIS — G2581 Restless legs syndrome: Secondary | ICD-10-CM | POA: Diagnosis not present

## 2021-05-06 DIAGNOSIS — Z950 Presence of cardiac pacemaker: Secondary | ICD-10-CM | POA: Diagnosis not present

## 2021-05-08 ENCOUNTER — Encounter: Payer: Self-pay | Admitting: Cardiovascular Disease

## 2021-05-08 NOTE — Progress Notes (Signed)
Patient ID: [name], female   DOB: 15-Sep-1929, 88 y.o.   MRN: [id number]     Cardiology Office Note    Date:  05/08/2021   ID:  [name], DOB [birth year], MRN [id number]  PCP:  Imagene Riches, NP  Cardiologist:   Sanda Klein, MD   Chief Complaint  Patient presents with   Irregular Heart Beat   Cardiac Valve Problem    History of Present Illness:  [name] is a 88 y.o. female with a history of paroxysmal atrial fibrillation on chronic anticoagulation, sinus node dysfunction s/p dual chamber pacemaker (leads 2005, generator change 2014 and August 2022, [employer]), valvular heart disease with a biological aortic valve prosthesis (21 mm, 2005, Dr. Servando Snare) which has subsequently developed severe stenosis, severe hypercholesterolemia. She has not had clinical coronary disease, but preoperative cardiac catheterization in 2005 showed 40% LAD stenosis.   She was hospitalized last month with acute heart failure exacerbation and was found to have severe aortic stenosis.  There was a surprising increase in the transaortic gradients compared with only 6 months earlier (mean gradient 29 mmHg, effective orifice area 0.5 cm, dimensionless valve index 0.2).  It is possible that the gradients are underestimated on the echo from July 2022.    Options for care were discussed and she does not want to undergo any invasive procedures, including TAVR.  She firmly decided on DNR status.  She is scheduled to have a palliative care evaluation later today.  She is actually done better since returning home.  She has not had any problems with orthopnea or PND and can get around the house without significant shortness of breath.  She is not using the oxygen.  She denies syncope or angina.  She does not have problems with edema.  On her home scale she weighs about 133-135 pounds, 3-5 pounds less compared with our office scale.  Pacemaker interrogation shows normal device function.  The  estimated generator longevity is 6-7 years.  He has been in persistent atrial fibrillation since mid December and had about 47% ventricular pacing.  We have stopped her diltiazem to try to reduce the amount of ventricular pacing (this was only about 5% when she was in normal atrial paced rhythm).  She has previously had very lengthy persistent atrial fibrillation, up to a year in duration and then return to normal rhythm.  She has a chronically elevated right ventricular pacing threshold, but this is stable.  She has not been able to tolerate statins after trying multiple different agents.  She also was intolerant of Zetia.  On treatment with Repatha, she has had some improvement in her LDL cholesterol, but less than one would expect.  She probably has heterozygous familial hypercholesterolemia.   Past Medical History:  Diagnosis Date   Anemia    Anxiety    CAD (coronary artery disease)    CHF (congestive heart failure) (HCC)    EF 55% on 01/05/11, trace AR w/ Bioprosthetic valve   DM2 (diabetes mellitus, type 2) (HCC)    GERD (gastroesophageal reflux disease)    HLD (hyperlipidemia)    HTN (hypertension)    PAF (paroxysmal atrial fibrillation) (HCC)    Restless legs syndrome (RLS)    Tobacco abuse     Past Surgical History:  Procedure Laterality Date   AORTIC VALVE REPLACEMENT  08/25/2002   tissue valve   CARDIOVERSION N/A 05/05/2014   Procedure: CARDIOVERSION;  Surgeon: Dorothy Spark, MD;  Location: [facility];  Service: Cardiovascular;  Laterality: N/A;   PACEMAKER INSERTION  04/02/2012   [employer]   PERMANENT PACEMAKER GENERATOR CHANGE N/A 04/02/2012   Procedure: PERMANENT PACEMAKER GENERATOR CHANGE;  Surgeon: Sanda Klein, MD;  Location: [facility];  Service: Cardiovascular;  Laterality: N/A;   PPM GENERATOR CHANGEOUT N/A 10/11/2020   Procedure: PPM GENERATOR CHANGEOUT;  Surgeon: Sanda Klein, MD;  Location: [facility];  Service: Cardiovascular;  Laterality: N/A;     Outpatient Medications Prior to Visit  Medication Sig Dispense Refill   ALPRAZolam (XANAX) 1 MG tablet Take 0.5 tablets (0.5 mg total) by mouth 2 (two) times daily as needed for anxiety. (Patient taking differently: Take 1 mg by mouth at bedtime as needed for anxiety.) 60 tablet 0   apixaban (ELIQUIS) 2.5 MG TABS tablet TAKE 1 TABLET (2.5 MG TOTAL) BY MOUTH 2 (TWO) TIMES DAILY. (Patient taking differently: Take 2.5 mg by mouth 2 (two) times daily.) 60 tablet 11   Blood Glucose Monitoring Suppl (ONE TOUCH ULTRA 2) W/DEVICE KIT   0   Cyanocobalamin (VITAMIN DEFICIENCY SYSTEM-B12 IJ) Inject 1 Dose as directed every 30 (thirty) days. Not sure about dosage     Evolocumab (REPATHA SURECLICK) 595 MG/ML SOAJ INJECT 140 MG INTO THE SKIN EVERY 14 (FOURTEEN) DAYS. 2 mL 11   ezetimibe (ZETIA) 10 MG tablet TAKE 1 TABLET (10 MG TOTAL) BY MOUTH DAILY. 90 tablet 0   ferrous sulfate 325 (65 FE) MG tablet Take 1 tablet (325 mg total) by mouth daily with breakfast.     linaclotide (LINZESS) 145 MCG CAPS capsule Take 145 mcg by mouth daily as needed (constipation).     metFORMIN (GLUCOPHAGE) 500 MG tablet TAKE 1 TABLET (500 MG TOTAL) BY MOUTH 2 (TWO) TIMES DAILY WITH A MEAL. 180 tablet 3   metoprolol tartrate (LOPRESSOR) 100 MG tablet Take 1 tablet (100 mg total) by mouth 2 (two) times daily. 60 tablet 5   omeprazole (PRILOSEC) 20 MG capsule TAKE 1 CAPSULE BY MOUTH EVERY DAY (Patient taking differently: 20 mg daily.) 90 capsule 0   potassium chloride SA (KLOR-CON M) 20 MEQ tablet Take 2 tablets (40 mEq total) by mouth daily. 60 tablet 0   diltiazem (CARDIZEM CD) 240 MG 24 hr capsule TAKE 1 CAPSULE (240 MG TOTAL) BY MOUTH DAILY. NEED OFFICE VISIT (Patient taking differently: 240 mg daily.) 90 capsule 3   furosemide (LASIX) 40 MG tablet Take 1-2 tablets (40-80 mg total) by mouth daily. Take 22m daily when weight is<138lbs and take 89mdaily when weight is >138lbs 60 tablet 0   fluticasone (FLONASE) 50 MCG/ACT  nasal spray Place 1 spray into both nostrils daily as needed for allergies. (Patient not taking: Reported on 05/02/2021)     ONE TOUCH ULTRA TEST test strip TEST BLOOD SUGAR TWICE A DAY (Patient not taking: Reported on 05/02/2021) 50 each 4   No facility-administered medications prior to visit.     Allergies:   Escitalopram, Pravachol [pravastatin sodium], and Tape   Social History   Socioeconomic History   Marital status: Widowed    Spouse name: Not on file   Number of children: 5   Years of education: 5   Highest education level: Not on file  Occupational History   Occupation: Retired-Farmer    Employer: RETIRED  Tobacco Use   Smoking status: Never   Smokeless tobacco: Current    Types: Snuff  Substance and Sexual Activity   Alcohol use: No    Alcohol/week: 0.0 standard drinks  Drug use: No   Sexual activity: Not on file  Other Topics Concern   Not on file  Social History Narrative   Health Care POA:    Emergency Contact: daughter, [name] [phone]   End of Life Plan: gave pt ad pamphlet   Who lives with you: daughter and 2 grandchildren   Any pets: 2 dogs   Diet: pt has a varied diet of protein, starch and vegetables.   Exercise: pt garden's daily   Seatbelts: pt does not wear seatbelt because of pace maker   Sun Exposure/Protection: pt does not use sun protection, has irregular dermatology appointments.   Hobbies: gardening, sitting on porch.         Social Determinants of Health   Financial Resource Strain: Not on file  Food Insecurity: Not on file  Transportation Needs: Not on file  Physical Activity: Not on file  Stress: Not on file  Social Connections: Not on file     Family History:  The patient's family history includes Heart attack in her brother; Kidney disease in her sister.   ROS:   Please see the history of present illness.    ROS All other systems are reviewed and are negative.  PHYSICAL EXAM:   VS:  BP 126/73    Pulse 74    Ht _0   (1.626 m)    Wt 138 lb 9.6 oz (62.9 kg)    SpO2 99%    BMI 23.79 kg/m       General: Alert, oriented x3, no distress, elderly and frail.  Pacemaker site appears healthy, although the skin overlying the device is very thin. Head: Scar of right nare, PERRL, EOMI, no exophtalmos or lid lag, no myxedema, no xanthelasma; normal ears, nose and oropharynx Neck: normal jugular venous pulsations and no hepatojugular reflux; brisk carotid pulses without delay and no carotid bruits Chest: clear to auscultation, no signs of consolidation by percussion or palpation, normal fremitus, symmetrical and full respiratory excursions Cardiovascular: normal position and quality of the apical impulse, regular rhythm, normal first and still distinct second heart sounds, 3/6 early to mid peaking aortic ejection murmur, no diastolic murmurs, rubs or gallops Abdomen: no tenderness or distention, no masses by palpation, no abnormal pulsatility or arterial bruits, normal bowel sounds, no hepatosplenomegaly Extremities: no clubbing, cyanosis or edema; 2+ radial, ulnar and brachial pulses bilaterally; 2+ right femoral, posterior tibial and dorsalis pedis pulses; 2+ left femoral, posterior tibial and dorsalis pedis pulses; no subclavian or femoral bruits Neurological: grossly nonfocal Psych: Normal mood and affect    Wt Readings from Last 3 Encounters:  05/02/21 138 lb 9.6 oz (62.9 kg)  04/15/21 135 lb 9.3 oz (61.5 kg)  01/17/21 148 lb 3.2 oz (67.2 kg)      Studies/Labs Reviewed:  Echocardiogram 04/11/2021    1. Left ventricular ejection fraction, by estimation, is 60 to 65%. The left ventricle has normal function. The left ventricle has no regional wall motion abnormalities. There is moderate left ventricular hypertrophy.  Left ventricular diastolic parameters are indeterminate.   2. Right ventricular systolic function was not well visualized. The right ventricular size is not well visualized. There is normal  pulmonary artery systolic pressure. The estimated right ventricular systolic pressure is 99.3 mmHg.   3. The mitral valve is degenerative. Trivial mitral valve regurgitation. No evidence of mitral stenosis.   4. There is a bioprosthetic valve present in the aortic position.     Aortic valve regurgitation is trivial. Vmax 3.4 m/s,  MG 29 mmHg, EOA 0.5 cm^2, DI 0.2. Findings suggest significant prosthetic valve stenosis, gradient has significantly increased from prior echo 08/2020 (15 > 29 mmHg)   5. The inferior vena cava is normal in size with greater than 50% respiratory variability, suggesting right atrial pressure of 3 mmHg.   6. Left atrial size was mildly dilated.   EKG: ECG is not ordered today.  Intracardiac electrogram shows ventricular paced rhythm in a background of atrial fibrillation.  Recent Labs: Labs from 11/29/2018 show normal liver function tests and normal TSH, hemoglobin 11.5, creatinine 1.46  Labs from 01/08/2020 from PCP are significant for potassium 4.1, creatinine 1.40, normal liver function tests, hemoglobin A1c 5.5%, hemoglobin 11.6 Total cholesterol 284, LDL 180, triglycerides 124, HDL 55  BMET    Component Value Date/Time   NA 138 04/15/2021 0404   NA 143 10/07/2020 1200   K 4.8 04/15/2021 0404   CL 100 04/15/2021 0404   CO2 26 04/15/2021 0404   GLUCOSE 104 (H) 04/15/2021 0404   BUN 31 (H) 04/15/2021 0404   BUN 24 10/07/2020 1200   CREATININE 2.56 (H) 04/15/2021 0404   CREATININE 1.27 (H) 09/02/2015 1124   CALCIUM 8.6 (L) 04/15/2021 0404   GFRNONAA 17 (L) 04/15/2021 0404   GFRNONAA 39 (L) 09/02/2015 1124   GFRAA 34 (L) 12/16/2015 0916   GFRAA 45 (L) 09/02/2015 1124   Lipid Panel    Component Value Date/Time   CHOL 225 (H) 05/18/2020 1537   TRIG 171 (H) 05/18/2020 1537   HDL 61 05/18/2020 1537   CHOLHDL 3.7 05/18/2020 1537   CHOLHDL 6.8 Ratio 04/06/2009 2033   VLDL 41 (H) 04/06/2009 2033   LDLCALC 134 (H) 05/18/2020 1537   LDLDIRECT 141 (H) 06/14/2015  1507   Prior to initiating Repatha on 01/08/2020 total cholesterol 284, LDL 180, HDL 55, triglycerides 224  05/18/2020 Cholesterol 225, HDL 61, LDL 134, triglycerides 171  ASSESSMENT:    1. Chronic diastolic heart failure ([facility])   2. Prosthetic valve dysfunction, subsequent encounter   3. History of aortic valve replacement with bioprosthetic valve   4. Persistent atrial fibrillation ([facility])   5. SSS (sick sinus syndrome) (HCC)   6. Pacemaker   7. Coronary artery disease involving native coronary artery of native heart without angina pectoris   8. Heterozygous familial hypercholesterolemia   9. Controlled type 2 diabetes mellitus with stage 3 chronic kidney disease, without long-term current use of insulin ([facility])   10. Essential hypertension   11. Acquired thrombophilia ([facility])   12. Acute renal failure superimposed on stage 3b chronic kidney disease, unspecified acute renal failure type ([facility])       PLAN:  In order of problems listed above:  CHF: Clinically appears euvolemic.  Has preserved systolic function.  Expect it will be challenging to maintain balance between heart failure and renal dysfunction, increasingly so as valve dysfunction progresses.  Reinforced the importance of daily weight monitoring.  Sodium restricted diet reviewed.  It is possible that increased burden of ventricular pacing, which occurs when she has atrial fibrillation, contributed to heart failure exacerbation. Prosthetic aortic valve stenosis: She is not interested in undergoing TAVR or cardiac catheterization in general.  It is interesting that on physical exam she does not yet have features to suggest severe aortic stenosis (the murmur is not late peaking, the second heart sound is very distinct).  Since her echocardiogram was performed in atrial fibrillation, the challenge in accurately measuring left ventricular outflow tract and aortic valve VTI's  may contribute to some exaggeration of the severity of aortic  stenosis. Atrial flutter/atrial fibrillation: Recent increase in burden of arrhythmia, with persistent atrial fibrillation over the last 3 months.  Always well rate controlled and asymptomatic, but does lead to increased frequency of ventricular pacing during the arrhythmia.  Appropriate anticoagulated.  CHADSVasc score 6 (age 71, gender, HBP, vascular disease, DM).  Antiarrhythmic therapy has never seemed appropriate in the absence of symptoms.  The only antiarrhythmic that would be safe to use in her case (since she has renal dysfunction and heart failure) would be amiodarone.  Paradoxically, treatment with amiodarone may be disadvantageous by increasing the likelihood of ventricular pacing. SSS: Prior to the onset of atrial fibrillation, her heart rate histogram distribution was appropriate with the current sensor settings. Pacemaker.  Remote downloads every 3 months.  He increased ventricular pacing threshold and increased need for RV pacing limit the longevity of her generator, but I do not think is likely she will live long enough to require another generator change. CAD: Has never had angina pectoris..  Minor coronary artery disease by cardiac catheterization in 2005.  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  Started Repatha and LDL decreased from 180 to 134 intolerant to statins and ezetimibe.  Only partial response to Repatha. DM: Well-controlled.  Hemoglobin A1c was 5.9% in February. HTN: Well-controlled.  Note known history of orthostatic hypotension.  Gradually reducing the metoprolol to reduce the need for pacing, may need to use alternative blood pressure medications Chronic anticoagulation: Eliquis dose adjusted for age and body habitus.  No bleeding problems.  Recent hemoglobin slightly reduced at 10.3. CKD: GFR has been stable around 30.  Following treatment with diuretics for heart failure her creatinine increased to 2.56  at the time of discharge, which would correspond to a GFR less than 20.  Nevertheless, the focus is on control of symptoms and I think we will need to sacrifice some degree of renal dysfunction so that she does not have shortness of breath from her cardiac problems. Goals of care: Patient states very clearly and without emotion that she is satisfied with the length of her life and that she is ready to pass along when the time comes.  She does not want any aggressive or invasive medical therapies.  Palliative care evaluation is scheduled.    Medication Adjustments/Labs and Tests Ordered: Current medicines are reviewed at length with the patient today.  Concerns regarding medicines are outlined above.  Medication changes, Labs and Tests ordered today are listed in the Patient Instructions below. Patient Instructions  Medication Instructions:  Take Furosemide 20 mg once daily. Take 40 mg for a weight over 140 lbs  *If you need a refill on your cardiac medications before your next appointment, please call your pharmacy*   Lab Work: None ordered If you have labs (blood work) drawn today and your tests are completely normal, you will receive your results only by: [facility] (if you have MyChart) OR A paper copy in the mail If you have any lab test that is abnormal or we need to change your treatment, we will call you to review the results.   Testing/Procedures: None ordered   Follow-Up: At [facility], you and your health needs are our priority.  As part of our continuing mission to provide you with exceptional heart care, we have created designated Provider Care Teams.  These Care Teams include your primary Cardiologist (physician) and Advanced Practice Providers (  APPs -  Physician Assistants and Nurse Practitioners) who all work together to provide you with the care you need, when you need it.  We recommend signing up for the patient portal called "MyChart".  Sign up information is  provided on this After Visit Summary.  MyChart is used to connect with patients for Virtual Visits (Telemedicine).  Patients are able to view lab/test results, encounter notes, upcoming appointments, etc.  Non-urgent messages can be sent to your provider as well.   To learn more about what you can do with MyChart, go to [email or web address].    Your next appointment:   6 month(s)  The format for your next appointment:   In Person  Provider:   Sanda Klein, MD {       Signed, Sanda Klein, MD  05/08/2021 2:48 PM    [address], [place], [place]  [address] Phone: [phone]; Fax: [phone]

## 2021-05-09 DIAGNOSIS — I4892 Unspecified atrial flutter: Secondary | ICD-10-CM | POA: Diagnosis not present

## 2021-05-09 DIAGNOSIS — I130 Hypertensive heart and chronic kidney disease with heart failure and stage 1 through stage 4 chronic kidney disease, or unspecified chronic kidney disease: Secondary | ICD-10-CM | POA: Diagnosis not present

## 2021-05-09 DIAGNOSIS — G2581 Restless legs syndrome: Secondary | ICD-10-CM | POA: Diagnosis not present

## 2021-05-09 DIAGNOSIS — I350 Nonrheumatic aortic (valve) stenosis: Secondary | ICD-10-CM | POA: Diagnosis not present

## 2021-05-09 DIAGNOSIS — E1122 Type 2 diabetes mellitus with diabetic chronic kidney disease: Secondary | ICD-10-CM | POA: Diagnosis not present

## 2021-05-09 DIAGNOSIS — N1832 Chronic kidney disease, stage 3b: Secondary | ICD-10-CM | POA: Diagnosis not present

## 2021-05-09 DIAGNOSIS — Z9181 History of falling: Secondary | ICD-10-CM | POA: Diagnosis not present

## 2021-05-09 DIAGNOSIS — I480 Paroxysmal atrial fibrillation: Secondary | ICD-10-CM | POA: Diagnosis not present

## 2021-05-09 DIAGNOSIS — Z7901 Long term (current) use of anticoagulants: Secondary | ICD-10-CM | POA: Diagnosis not present

## 2021-05-09 DIAGNOSIS — I495 Sick sinus syndrome: Secondary | ICD-10-CM | POA: Diagnosis not present

## 2021-05-09 DIAGNOSIS — K219 Gastro-esophageal reflux disease without esophagitis: Secondary | ICD-10-CM | POA: Diagnosis not present

## 2021-05-09 DIAGNOSIS — Z7984 Long term (current) use of oral hypoglycemic drugs: Secondary | ICD-10-CM | POA: Diagnosis not present

## 2021-05-09 DIAGNOSIS — I509 Heart failure, unspecified: Secondary | ICD-10-CM | POA: Diagnosis not present

## 2021-05-09 DIAGNOSIS — Z950 Presence of cardiac pacemaker: Secondary | ICD-10-CM | POA: Diagnosis not present

## 2021-05-09 DIAGNOSIS — F17210 Nicotine dependence, cigarettes, uncomplicated: Secondary | ICD-10-CM | POA: Diagnosis not present

## 2021-05-09 DIAGNOSIS — E785 Hyperlipidemia, unspecified: Secondary | ICD-10-CM | POA: Diagnosis not present

## 2021-05-09 DIAGNOSIS — D631 Anemia in chronic kidney disease: Secondary | ICD-10-CM | POA: Diagnosis not present

## 2021-05-09 DIAGNOSIS — I2510 Atherosclerotic heart disease of native coronary artery without angina pectoris: Secondary | ICD-10-CM | POA: Diagnosis not present

## 2021-05-10 DIAGNOSIS — K219 Gastro-esophageal reflux disease without esophagitis: Secondary | ICD-10-CM | POA: Diagnosis not present

## 2021-05-10 DIAGNOSIS — I509 Heart failure, unspecified: Secondary | ICD-10-CM | POA: Diagnosis not present

## 2021-05-10 DIAGNOSIS — Z7901 Long term (current) use of anticoagulants: Secondary | ICD-10-CM | POA: Diagnosis not present

## 2021-05-10 DIAGNOSIS — E1122 Type 2 diabetes mellitus with diabetic chronic kidney disease: Secondary | ICD-10-CM | POA: Diagnosis not present

## 2021-05-10 DIAGNOSIS — G2581 Restless legs syndrome: Secondary | ICD-10-CM | POA: Diagnosis not present

## 2021-05-10 DIAGNOSIS — I350 Nonrheumatic aortic (valve) stenosis: Secondary | ICD-10-CM | POA: Diagnosis not present

## 2021-05-10 DIAGNOSIS — Z9181 History of falling: Secondary | ICD-10-CM | POA: Diagnosis not present

## 2021-05-10 DIAGNOSIS — E785 Hyperlipidemia, unspecified: Secondary | ICD-10-CM | POA: Diagnosis not present

## 2021-05-10 DIAGNOSIS — I4892 Unspecified atrial flutter: Secondary | ICD-10-CM | POA: Diagnosis not present

## 2021-05-10 DIAGNOSIS — Z950 Presence of cardiac pacemaker: Secondary | ICD-10-CM | POA: Diagnosis not present

## 2021-05-10 DIAGNOSIS — I480 Paroxysmal atrial fibrillation: Secondary | ICD-10-CM | POA: Diagnosis not present

## 2021-05-10 DIAGNOSIS — I2510 Atherosclerotic heart disease of native coronary artery without angina pectoris: Secondary | ICD-10-CM | POA: Diagnosis not present

## 2021-05-10 DIAGNOSIS — I495 Sick sinus syndrome: Secondary | ICD-10-CM | POA: Diagnosis not present

## 2021-05-10 DIAGNOSIS — N1832 Chronic kidney disease, stage 3b: Secondary | ICD-10-CM | POA: Diagnosis not present

## 2021-05-10 DIAGNOSIS — Z7984 Long term (current) use of oral hypoglycemic drugs: Secondary | ICD-10-CM | POA: Diagnosis not present

## 2021-05-10 DIAGNOSIS — D631 Anemia in chronic kidney disease: Secondary | ICD-10-CM | POA: Diagnosis not present

## 2021-05-10 DIAGNOSIS — I130 Hypertensive heart and chronic kidney disease with heart failure and stage 1 through stage 4 chronic kidney disease, or unspecified chronic kidney disease: Secondary | ICD-10-CM | POA: Diagnosis not present

## 2021-05-10 DIAGNOSIS — F17210 Nicotine dependence, cigarettes, uncomplicated: Secondary | ICD-10-CM | POA: Diagnosis not present

## 2021-05-13 DIAGNOSIS — I4892 Unspecified atrial flutter: Secondary | ICD-10-CM | POA: Diagnosis not present

## 2021-05-13 DIAGNOSIS — N1832 Chronic kidney disease, stage 3b: Secondary | ICD-10-CM | POA: Diagnosis not present

## 2021-05-13 DIAGNOSIS — F17210 Nicotine dependence, cigarettes, uncomplicated: Secondary | ICD-10-CM | POA: Diagnosis not present

## 2021-05-13 DIAGNOSIS — D631 Anemia in chronic kidney disease: Secondary | ICD-10-CM | POA: Diagnosis not present

## 2021-05-13 DIAGNOSIS — Z7901 Long term (current) use of anticoagulants: Secondary | ICD-10-CM | POA: Diagnosis not present

## 2021-05-13 DIAGNOSIS — E785 Hyperlipidemia, unspecified: Secondary | ICD-10-CM | POA: Diagnosis not present

## 2021-05-13 DIAGNOSIS — I480 Paroxysmal atrial fibrillation: Secondary | ICD-10-CM | POA: Diagnosis not present

## 2021-05-13 DIAGNOSIS — I2510 Atherosclerotic heart disease of native coronary artery without angina pectoris: Secondary | ICD-10-CM | POA: Diagnosis not present

## 2021-05-13 DIAGNOSIS — Z7984 Long term (current) use of oral hypoglycemic drugs: Secondary | ICD-10-CM | POA: Diagnosis not present

## 2021-05-13 DIAGNOSIS — I130 Hypertensive heart and chronic kidney disease with heart failure and stage 1 through stage 4 chronic kidney disease, or unspecified chronic kidney disease: Secondary | ICD-10-CM | POA: Diagnosis not present

## 2021-05-13 DIAGNOSIS — I509 Heart failure, unspecified: Secondary | ICD-10-CM | POA: Diagnosis not present

## 2021-05-13 DIAGNOSIS — E1122 Type 2 diabetes mellitus with diabetic chronic kidney disease: Secondary | ICD-10-CM | POA: Diagnosis not present

## 2021-05-13 DIAGNOSIS — K219 Gastro-esophageal reflux disease without esophagitis: Secondary | ICD-10-CM | POA: Diagnosis not present

## 2021-05-13 DIAGNOSIS — I350 Nonrheumatic aortic (valve) stenosis: Secondary | ICD-10-CM | POA: Diagnosis not present

## 2021-05-13 DIAGNOSIS — I495 Sick sinus syndrome: Secondary | ICD-10-CM | POA: Diagnosis not present

## 2021-05-13 DIAGNOSIS — Z9181 History of falling: Secondary | ICD-10-CM | POA: Diagnosis not present

## 2021-05-13 DIAGNOSIS — G2581 Restless legs syndrome: Secondary | ICD-10-CM | POA: Diagnosis not present

## 2021-05-13 DIAGNOSIS — Z950 Presence of cardiac pacemaker: Secondary | ICD-10-CM | POA: Diagnosis not present

## 2021-05-17 DIAGNOSIS — D631 Anemia in chronic kidney disease: Secondary | ICD-10-CM | POA: Diagnosis not present

## 2021-05-17 DIAGNOSIS — I350 Nonrheumatic aortic (valve) stenosis: Secondary | ICD-10-CM | POA: Diagnosis not present

## 2021-05-17 DIAGNOSIS — Z9181 History of falling: Secondary | ICD-10-CM | POA: Diagnosis not present

## 2021-05-17 DIAGNOSIS — I2510 Atherosclerotic heart disease of native coronary artery without angina pectoris: Secondary | ICD-10-CM | POA: Diagnosis not present

## 2021-05-17 DIAGNOSIS — I4892 Unspecified atrial flutter: Secondary | ICD-10-CM | POA: Diagnosis not present

## 2021-05-17 DIAGNOSIS — N1832 Chronic kidney disease, stage 3b: Secondary | ICD-10-CM | POA: Diagnosis not present

## 2021-05-17 DIAGNOSIS — I495 Sick sinus syndrome: Secondary | ICD-10-CM | POA: Diagnosis not present

## 2021-05-17 DIAGNOSIS — K219 Gastro-esophageal reflux disease without esophagitis: Secondary | ICD-10-CM | POA: Diagnosis not present

## 2021-05-17 DIAGNOSIS — Z7901 Long term (current) use of anticoagulants: Secondary | ICD-10-CM | POA: Diagnosis not present

## 2021-05-17 DIAGNOSIS — Z7984 Long term (current) use of oral hypoglycemic drugs: Secondary | ICD-10-CM | POA: Diagnosis not present

## 2021-05-17 DIAGNOSIS — E1122 Type 2 diabetes mellitus with diabetic chronic kidney disease: Secondary | ICD-10-CM | POA: Diagnosis not present

## 2021-05-17 DIAGNOSIS — I130 Hypertensive heart and chronic kidney disease with heart failure and stage 1 through stage 4 chronic kidney disease, or unspecified chronic kidney disease: Secondary | ICD-10-CM | POA: Diagnosis not present

## 2021-05-17 DIAGNOSIS — F17210 Nicotine dependence, cigarettes, uncomplicated: Secondary | ICD-10-CM | POA: Diagnosis not present

## 2021-05-17 DIAGNOSIS — I480 Paroxysmal atrial fibrillation: Secondary | ICD-10-CM | POA: Diagnosis not present

## 2021-05-17 DIAGNOSIS — G2581 Restless legs syndrome: Secondary | ICD-10-CM | POA: Diagnosis not present

## 2021-05-17 DIAGNOSIS — Z950 Presence of cardiac pacemaker: Secondary | ICD-10-CM | POA: Diagnosis not present

## 2021-05-17 DIAGNOSIS — I509 Heart failure, unspecified: Secondary | ICD-10-CM | POA: Diagnosis not present

## 2021-05-17 DIAGNOSIS — E785 Hyperlipidemia, unspecified: Secondary | ICD-10-CM | POA: Diagnosis not present

## 2021-05-18 DIAGNOSIS — N1832 Chronic kidney disease, stage 3b: Secondary | ICD-10-CM | POA: Diagnosis not present

## 2021-05-18 DIAGNOSIS — I4892 Unspecified atrial flutter: Secondary | ICD-10-CM | POA: Diagnosis not present

## 2021-05-18 DIAGNOSIS — I130 Hypertensive heart and chronic kidney disease with heart failure and stage 1 through stage 4 chronic kidney disease, or unspecified chronic kidney disease: Secondary | ICD-10-CM | POA: Diagnosis not present

## 2021-05-18 DIAGNOSIS — Z7984 Long term (current) use of oral hypoglycemic drugs: Secondary | ICD-10-CM | POA: Diagnosis not present

## 2021-05-18 DIAGNOSIS — Z7901 Long term (current) use of anticoagulants: Secondary | ICD-10-CM | POA: Diagnosis not present

## 2021-05-18 DIAGNOSIS — I350 Nonrheumatic aortic (valve) stenosis: Secondary | ICD-10-CM | POA: Diagnosis not present

## 2021-05-18 DIAGNOSIS — Z950 Presence of cardiac pacemaker: Secondary | ICD-10-CM | POA: Diagnosis not present

## 2021-05-18 DIAGNOSIS — I495 Sick sinus syndrome: Secondary | ICD-10-CM | POA: Diagnosis not present

## 2021-05-18 DIAGNOSIS — Z9181 History of falling: Secondary | ICD-10-CM | POA: Diagnosis not present

## 2021-05-18 DIAGNOSIS — K219 Gastro-esophageal reflux disease without esophagitis: Secondary | ICD-10-CM | POA: Diagnosis not present

## 2021-05-18 DIAGNOSIS — E1122 Type 2 diabetes mellitus with diabetic chronic kidney disease: Secondary | ICD-10-CM | POA: Diagnosis not present

## 2021-05-18 DIAGNOSIS — D631 Anemia in chronic kidney disease: Secondary | ICD-10-CM | POA: Diagnosis not present

## 2021-05-18 DIAGNOSIS — E785 Hyperlipidemia, unspecified: Secondary | ICD-10-CM | POA: Diagnosis not present

## 2021-05-18 DIAGNOSIS — I2510 Atherosclerotic heart disease of native coronary artery without angina pectoris: Secondary | ICD-10-CM | POA: Diagnosis not present

## 2021-05-18 DIAGNOSIS — I509 Heart failure, unspecified: Secondary | ICD-10-CM | POA: Diagnosis not present

## 2021-05-18 DIAGNOSIS — F17210 Nicotine dependence, cigarettes, uncomplicated: Secondary | ICD-10-CM | POA: Diagnosis not present

## 2021-05-18 DIAGNOSIS — I480 Paroxysmal atrial fibrillation: Secondary | ICD-10-CM | POA: Diagnosis not present

## 2021-05-18 DIAGNOSIS — G2581 Restless legs syndrome: Secondary | ICD-10-CM | POA: Diagnosis not present

## 2021-05-20 DIAGNOSIS — I509 Heart failure, unspecified: Secondary | ICD-10-CM | POA: Diagnosis not present

## 2021-05-20 DIAGNOSIS — Z9181 History of falling: Secondary | ICD-10-CM | POA: Diagnosis not present

## 2021-05-20 DIAGNOSIS — E1122 Type 2 diabetes mellitus with diabetic chronic kidney disease: Secondary | ICD-10-CM | POA: Diagnosis not present

## 2021-05-20 DIAGNOSIS — I130 Hypertensive heart and chronic kidney disease with heart failure and stage 1 through stage 4 chronic kidney disease, or unspecified chronic kidney disease: Secondary | ICD-10-CM | POA: Diagnosis not present

## 2021-05-20 DIAGNOSIS — M6281 Muscle weakness (generalized): Secondary | ICD-10-CM | POA: Diagnosis not present

## 2021-05-23 DIAGNOSIS — G2581 Restless legs syndrome: Secondary | ICD-10-CM | POA: Diagnosis not present

## 2021-05-23 DIAGNOSIS — Z950 Presence of cardiac pacemaker: Secondary | ICD-10-CM | POA: Diagnosis not present

## 2021-05-23 DIAGNOSIS — N1832 Chronic kidney disease, stage 3b: Secondary | ICD-10-CM | POA: Diagnosis not present

## 2021-05-23 DIAGNOSIS — I350 Nonrheumatic aortic (valve) stenosis: Secondary | ICD-10-CM | POA: Diagnosis not present

## 2021-05-23 DIAGNOSIS — F17210 Nicotine dependence, cigarettes, uncomplicated: Secondary | ICD-10-CM | POA: Diagnosis not present

## 2021-05-23 DIAGNOSIS — Z7984 Long term (current) use of oral hypoglycemic drugs: Secondary | ICD-10-CM | POA: Diagnosis not present

## 2021-05-23 DIAGNOSIS — D631 Anemia in chronic kidney disease: Secondary | ICD-10-CM | POA: Diagnosis not present

## 2021-05-23 DIAGNOSIS — E785 Hyperlipidemia, unspecified: Secondary | ICD-10-CM | POA: Diagnosis not present

## 2021-05-23 DIAGNOSIS — I4892 Unspecified atrial flutter: Secondary | ICD-10-CM | POA: Diagnosis not present

## 2021-05-23 DIAGNOSIS — I480 Paroxysmal atrial fibrillation: Secondary | ICD-10-CM | POA: Diagnosis not present

## 2021-05-23 DIAGNOSIS — K219 Gastro-esophageal reflux disease without esophagitis: Secondary | ICD-10-CM | POA: Diagnosis not present

## 2021-05-23 DIAGNOSIS — I509 Heart failure, unspecified: Secondary | ICD-10-CM | POA: Diagnosis not present

## 2021-05-23 DIAGNOSIS — I130 Hypertensive heart and chronic kidney disease with heart failure and stage 1 through stage 4 chronic kidney disease, or unspecified chronic kidney disease: Secondary | ICD-10-CM | POA: Diagnosis not present

## 2021-05-23 DIAGNOSIS — Z7901 Long term (current) use of anticoagulants: Secondary | ICD-10-CM | POA: Diagnosis not present

## 2021-05-23 DIAGNOSIS — I2510 Atherosclerotic heart disease of native coronary artery without angina pectoris: Secondary | ICD-10-CM | POA: Diagnosis not present

## 2021-05-23 DIAGNOSIS — E1122 Type 2 diabetes mellitus with diabetic chronic kidney disease: Secondary | ICD-10-CM | POA: Diagnosis not present

## 2021-05-23 DIAGNOSIS — Z9181 History of falling: Secondary | ICD-10-CM | POA: Diagnosis not present

## 2021-05-23 DIAGNOSIS — I495 Sick sinus syndrome: Secondary | ICD-10-CM | POA: Diagnosis not present

## 2021-05-24 DIAGNOSIS — Z1321 Encounter for screening for nutritional disorder: Secondary | ICD-10-CM | POA: Diagnosis not present

## 2021-05-24 DIAGNOSIS — I4891 Unspecified atrial fibrillation: Secondary | ICD-10-CM | POA: Diagnosis not present

## 2021-05-24 DIAGNOSIS — I10 Essential (primary) hypertension: Secondary | ICD-10-CM | POA: Diagnosis not present

## 2021-05-24 DIAGNOSIS — N1832 Chronic kidney disease, stage 3b: Secondary | ICD-10-CM | POA: Diagnosis not present

## 2021-05-24 DIAGNOSIS — Z6823 Body mass index (BMI) 23.0-23.9, adult: Secondary | ICD-10-CM | POA: Diagnosis not present

## 2021-05-24 DIAGNOSIS — N390 Urinary tract infection, site not specified: Secondary | ICD-10-CM | POA: Diagnosis not present

## 2021-05-24 DIAGNOSIS — I739 Peripheral vascular disease, unspecified: Secondary | ICD-10-CM | POA: Diagnosis not present

## 2021-05-24 DIAGNOSIS — E875 Hyperkalemia: Secondary | ICD-10-CM | POA: Diagnosis not present

## 2021-05-24 DIAGNOSIS — D509 Iron deficiency anemia, unspecified: Secondary | ICD-10-CM | POA: Diagnosis not present

## 2021-05-28 DIAGNOSIS — N179 Acute kidney failure, unspecified: Secondary | ICD-10-CM | POA: Diagnosis not present

## 2021-05-28 DIAGNOSIS — N189 Chronic kidney disease, unspecified: Secondary | ICD-10-CM | POA: Diagnosis not present

## 2021-05-28 DIAGNOSIS — J9601 Acute respiratory failure with hypoxia: Secondary | ICD-10-CM | POA: Diagnosis not present

## 2021-06-01 DIAGNOSIS — G2581 Restless legs syndrome: Secondary | ICD-10-CM | POA: Diagnosis not present

## 2021-06-01 DIAGNOSIS — F17210 Nicotine dependence, cigarettes, uncomplicated: Secondary | ICD-10-CM | POA: Diagnosis not present

## 2021-06-01 DIAGNOSIS — I495 Sick sinus syndrome: Secondary | ICD-10-CM | POA: Diagnosis not present

## 2021-06-01 DIAGNOSIS — N1832 Chronic kidney disease, stage 3b: Secondary | ICD-10-CM | POA: Diagnosis not present

## 2021-06-01 DIAGNOSIS — D631 Anemia in chronic kidney disease: Secondary | ICD-10-CM | POA: Diagnosis not present

## 2021-06-01 DIAGNOSIS — Z7901 Long term (current) use of anticoagulants: Secondary | ICD-10-CM | POA: Diagnosis not present

## 2021-06-01 DIAGNOSIS — I130 Hypertensive heart and chronic kidney disease with heart failure and stage 1 through stage 4 chronic kidney disease, or unspecified chronic kidney disease: Secondary | ICD-10-CM | POA: Diagnosis not present

## 2021-06-01 DIAGNOSIS — I2510 Atherosclerotic heart disease of native coronary artery without angina pectoris: Secondary | ICD-10-CM | POA: Diagnosis not present

## 2021-06-01 DIAGNOSIS — I480 Paroxysmal atrial fibrillation: Secondary | ICD-10-CM | POA: Diagnosis not present

## 2021-06-01 DIAGNOSIS — E1122 Type 2 diabetes mellitus with diabetic chronic kidney disease: Secondary | ICD-10-CM | POA: Diagnosis not present

## 2021-06-01 DIAGNOSIS — I509 Heart failure, unspecified: Secondary | ICD-10-CM | POA: Diagnosis not present

## 2021-06-01 DIAGNOSIS — K219 Gastro-esophageal reflux disease without esophagitis: Secondary | ICD-10-CM | POA: Diagnosis not present

## 2021-06-01 DIAGNOSIS — Z950 Presence of cardiac pacemaker: Secondary | ICD-10-CM | POA: Diagnosis not present

## 2021-06-01 DIAGNOSIS — E785 Hyperlipidemia, unspecified: Secondary | ICD-10-CM | POA: Diagnosis not present

## 2021-06-01 DIAGNOSIS — Z7984 Long term (current) use of oral hypoglycemic drugs: Secondary | ICD-10-CM | POA: Diagnosis not present

## 2021-06-01 DIAGNOSIS — I350 Nonrheumatic aortic (valve) stenosis: Secondary | ICD-10-CM | POA: Diagnosis not present

## 2021-06-01 DIAGNOSIS — Z9181 History of falling: Secondary | ICD-10-CM | POA: Diagnosis not present

## 2021-06-01 DIAGNOSIS — I4892 Unspecified atrial flutter: Secondary | ICD-10-CM | POA: Diagnosis not present

## 2021-06-02 DIAGNOSIS — F17210 Nicotine dependence, cigarettes, uncomplicated: Secondary | ICD-10-CM | POA: Diagnosis not present

## 2021-06-02 DIAGNOSIS — I2510 Atherosclerotic heart disease of native coronary artery without angina pectoris: Secondary | ICD-10-CM | POA: Diagnosis not present

## 2021-06-02 DIAGNOSIS — I130 Hypertensive heart and chronic kidney disease with heart failure and stage 1 through stage 4 chronic kidney disease, or unspecified chronic kidney disease: Secondary | ICD-10-CM | POA: Diagnosis not present

## 2021-06-02 DIAGNOSIS — I350 Nonrheumatic aortic (valve) stenosis: Secondary | ICD-10-CM | POA: Diagnosis not present

## 2021-06-02 DIAGNOSIS — I4892 Unspecified atrial flutter: Secondary | ICD-10-CM | POA: Diagnosis not present

## 2021-06-02 DIAGNOSIS — I495 Sick sinus syndrome: Secondary | ICD-10-CM | POA: Diagnosis not present

## 2021-06-02 DIAGNOSIS — E1122 Type 2 diabetes mellitus with diabetic chronic kidney disease: Secondary | ICD-10-CM | POA: Diagnosis not present

## 2021-06-02 DIAGNOSIS — Z7901 Long term (current) use of anticoagulants: Secondary | ICD-10-CM | POA: Diagnosis not present

## 2021-06-02 DIAGNOSIS — K219 Gastro-esophageal reflux disease without esophagitis: Secondary | ICD-10-CM | POA: Diagnosis not present

## 2021-06-02 DIAGNOSIS — Z9181 History of falling: Secondary | ICD-10-CM | POA: Diagnosis not present

## 2021-06-02 DIAGNOSIS — D631 Anemia in chronic kidney disease: Secondary | ICD-10-CM | POA: Diagnosis not present

## 2021-06-02 DIAGNOSIS — Z950 Presence of cardiac pacemaker: Secondary | ICD-10-CM | POA: Diagnosis not present

## 2021-06-02 DIAGNOSIS — E785 Hyperlipidemia, unspecified: Secondary | ICD-10-CM | POA: Diagnosis not present

## 2021-06-02 DIAGNOSIS — N1832 Chronic kidney disease, stage 3b: Secondary | ICD-10-CM | POA: Diagnosis not present

## 2021-06-02 DIAGNOSIS — I509 Heart failure, unspecified: Secondary | ICD-10-CM | POA: Diagnosis not present

## 2021-06-02 DIAGNOSIS — I480 Paroxysmal atrial fibrillation: Secondary | ICD-10-CM | POA: Diagnosis not present

## 2021-06-02 DIAGNOSIS — G2581 Restless legs syndrome: Secondary | ICD-10-CM | POA: Diagnosis not present

## 2021-06-02 DIAGNOSIS — Z7984 Long term (current) use of oral hypoglycemic drugs: Secondary | ICD-10-CM | POA: Diagnosis not present

## 2021-06-07 ENCOUNTER — Other Ambulatory Visit: Payer: Medicare Other | Admitting: Internal Medicine

## 2021-06-07 VITALS — BP 113/57 | HR 62 | Temp 97.9°F | Wt 139.3 lb

## 2021-06-07 DIAGNOSIS — N1832 Chronic kidney disease, stage 3b: Secondary | ICD-10-CM | POA: Diagnosis not present

## 2021-06-07 DIAGNOSIS — Z950 Presence of cardiac pacemaker: Secondary | ICD-10-CM | POA: Diagnosis not present

## 2021-06-07 DIAGNOSIS — Z7984 Long term (current) use of oral hypoglycemic drugs: Secondary | ICD-10-CM | POA: Diagnosis not present

## 2021-06-07 DIAGNOSIS — I480 Paroxysmal atrial fibrillation: Secondary | ICD-10-CM | POA: Diagnosis not present

## 2021-06-07 DIAGNOSIS — Z9181 History of falling: Secondary | ICD-10-CM | POA: Diagnosis not present

## 2021-06-07 DIAGNOSIS — J9601 Acute respiratory failure with hypoxia: Secondary | ICD-10-CM

## 2021-06-07 DIAGNOSIS — Z953 Presence of xenogenic heart valve: Secondary | ICD-10-CM | POA: Diagnosis not present

## 2021-06-07 DIAGNOSIS — E785 Hyperlipidemia, unspecified: Secondary | ICD-10-CM | POA: Diagnosis not present

## 2021-06-07 DIAGNOSIS — G2581 Restless legs syndrome: Secondary | ICD-10-CM | POA: Diagnosis not present

## 2021-06-07 DIAGNOSIS — R410 Disorientation, unspecified: Secondary | ICD-10-CM | POA: Diagnosis not present

## 2021-06-07 DIAGNOSIS — I5032 Chronic diastolic (congestive) heart failure: Secondary | ICD-10-CM | POA: Diagnosis not present

## 2021-06-07 DIAGNOSIS — I350 Nonrheumatic aortic (valve) stenosis: Secondary | ICD-10-CM | POA: Diagnosis not present

## 2021-06-07 DIAGNOSIS — E1122 Type 2 diabetes mellitus with diabetic chronic kidney disease: Secondary | ICD-10-CM | POA: Diagnosis not present

## 2021-06-07 DIAGNOSIS — Z515 Encounter for palliative care: Secondary | ICD-10-CM

## 2021-06-07 DIAGNOSIS — J90 Pleural effusion, not elsewhere classified: Secondary | ICD-10-CM | POA: Diagnosis not present

## 2021-06-07 DIAGNOSIS — I2510 Atherosclerotic heart disease of native coronary artery without angina pectoris: Secondary | ICD-10-CM | POA: Diagnosis not present

## 2021-06-07 DIAGNOSIS — I495 Sick sinus syndrome: Secondary | ICD-10-CM | POA: Diagnosis not present

## 2021-06-07 DIAGNOSIS — I4892 Unspecified atrial flutter: Secondary | ICD-10-CM | POA: Diagnosis not present

## 2021-06-07 DIAGNOSIS — D631 Anemia in chronic kidney disease: Secondary | ICD-10-CM | POA: Diagnosis not present

## 2021-06-07 DIAGNOSIS — I4891 Unspecified atrial fibrillation: Secondary | ICD-10-CM | POA: Diagnosis not present

## 2021-06-07 DIAGNOSIS — K219 Gastro-esophageal reflux disease without esophagitis: Secondary | ICD-10-CM | POA: Diagnosis not present

## 2021-06-07 DIAGNOSIS — I509 Heart failure, unspecified: Secondary | ICD-10-CM | POA: Diagnosis not present

## 2021-06-07 DIAGNOSIS — I130 Hypertensive heart and chronic kidney disease with heart failure and stage 1 through stage 4 chronic kidney disease, or unspecified chronic kidney disease: Secondary | ICD-10-CM | POA: Diagnosis not present

## 2021-06-07 DIAGNOSIS — F17210 Nicotine dependence, cigarettes, uncomplicated: Secondary | ICD-10-CM | POA: Diagnosis not present

## 2021-06-07 DIAGNOSIS — Z7901 Long term (current) use of anticoagulants: Secondary | ICD-10-CM | POA: Diagnosis not present

## 2021-06-07 NOTE — Progress Notes (Signed)
[facility] Visit Telephone: [phone]  Fax: [phone]   Date of encounter: 06/07/21 1:16 PM PATIENT NAME: [name] [address] [place] [address]   [phone] (home)  DOB: 29-Jul-1929 MRN: [id number] PRIMARY CARE PROVIDER:    Imagene Riches, [place],  [place] [address] [phone]  REFERRING PROVIDER:   Imagene Riches, NP [facility] [place],  [place] [address] [phone]  RESPONSIBLE PARTY:    Contact Information     Name Relation Home Work Mobile   Jauca Daughter   [phone]   Lazarus Salines Daughter   [phone]        I met face to face with patient and family in her home/facility. Palliative Care was asked to follow this patient by consultation request of  York, Regina F, NP to address advance care planning and complex medical decision making. This is follow-up visit.                                     ASSESSMENT AND PLAN / RECOMMENDATIONS:   Advance Care Planning/Goals of Care: Goals include to maximize quality of life and symptom management. Patient/health care surrogate gave his/her permission to discuss.Our advance care planning conversation included a discussion about:    The value and importance of advance care planning  Experiences with loved ones who have been seriously ill or have died  Exploration of personal, cultural or spiritual beliefs that might influence medical decisions  Exploration of goals of care in the event of a sudden injury or illness  Identification  of a healthcare agent--her daughter, [name]; her youngest son was also present for parts of the visit, but he prefers to leave all decisions and care to [facility], though he's very protective of his mother and she worries about him. Review and updating or creation of an  advance directive document . Decision not to resuscitate or to de-escalate disease focused treatments due to poor prognosis. CODE  STATUS:  DNR, MOST completed today:  comfort measures, abx determine case by case, IVF trial, no tube feeding Pt is ready to go when the Lord wants to take her--she says she's lived a good life.  Siblings have been around her age when they've died and she's ok with it.    Symptom Management/Plan: 1. Chronic diastolic CHF (congestive heart failure) (HCC) -appears this has stabilized, weight is remaining under 140 so not needing extra diuretics--continue same parameters as per Dr. Loletha Grayer -pt without dyspnea during walking, but gets extremely fatigued during ADLs or if she goes outside -has completed PT, OT and has just one more nurse visit this afternoon  2. Nonrheumatic aortic valve stenosis -not a candidate for a surgery at this point based on cardiology and general assessment--prognosis poor but she is back to walking around in her home, enjoying her life, eating well  3. History of aortic valve replacement with bioprosthetic valve -prior valve replacement now stenotic -must be careful with diuresis  4. Pleural effusion, bilateral -seems these are resolved--lungs clear today and sats wnl  5. Atrial fibrillation with RVR (HCC) -still has spurts of rapid rate that she feels, though today when she felt it, her pulse was still under 100  6. Delirium -seems this resolved with her acute chf and respiratory failure improving  7. Acute respiratory failure with hypoxia (HCC) -sats now wnl on RA 97%  8. Palliative care by specialist -completed MOST, reviewed our role in care and we will continue to monitor her and gave her daughter parameters about when it's a  good idea to contact us for assessment as patient does NOT want to go back to the hospital for care--prefers to be kept comfortable at home if she gets that sick  Follow up Palliative Care Visit: Palliative care will continue to follow for complex medical decision making, advance care planning, and clarification of goals. Return 07/26/2021 and  prn.  This visit was coded based on medical decision making (MDM). 38 minutes spent on ACP.  PPS: 40%  HOSPICE ELIGIBILITY/DIAGNOSIS: TBD/CHF with non-operable bioprosthetic AS  Chief Complaint: Follow-up palliative visit  HISTORY OF PRESENT ILLNESS:  [name] is a 88 y.o. year old female  with diastolic chf, nonoperable bioprosthetic AS, h/o recent admission with bilateral pleural effusions, rapid afib, decreased po intake, new incontinence of urine and paranoia.   [name] has been keeping a close eye on her--helping with her bathing, dressing, preparing her meals.  She has had to give her extra lasix on a couple of occasions, but stable for the past week or more w/o needing any.  Not sob when walking, but fatigued with ADLs and going outside.  Pt feels good overall.  No pain complaints.  Has bm qod.  Sleeps well.  Spirits are good. She's very HOH with two hearing aids, wears glasses.  History obtained from review of EMR, discussion with primary team, and interview with family, facility staff/caregiver and/or [name].  I reviewed available labs, medications, imaging, studies and related documents from the EMR.  Records reviewed and summarized above.   ROS  General: NAD EYES: denies vision changes--glasses ENMT: denies dysphagia Cardiovascular: denies chest pain, denies DOE Pulmonary: denies cough, denies increased SOB Abdomen: endorses good appetite--this has gotten better, denies constipation--goes about qod, endorses continence of bowel GU: denies dysuria, endorses incontinence of urine sometimes if doesn't go when gets urge MSK:  denies increased weakness,  no falls reported, ambulates with walker nervously Skin: denies rashes or wounds--numerous areas of skin cancers and dry scaly skin Neurological: denies pain, denies insomnia Psych: Endorses positive mood Heme/lymph/immuno:easily bruises, no abnormal bleeding  Physical Exam: Current and past weights:  139.3 lbs  today; was 138.6 lbs for virtual visit on 3/6 Constitutional: NAD General: frail appearing, thin except abdomen prominent EYES: anicteric sclera, lids intact, no discharge , glasses ENMT: hard of hearing even with bilateral hearing aids, oral mucous membranes moist CV: irreg irreg with high pitched blowing murmur in 2nd ICS, no LE edema--feet and ankles actually wrinkly  Pulmonary: LCTA, no increased work of breathing, no cough, room air Abdomen: intake 75%, normo-active BS + 4 quadrants, soft and non tender, no ascites GU: deferred MSK: sarcopenia, moves all extremities, ambulatory with walker--was able to pop up out of chair, but did fall back in once, gait was steady with her walker Skin: warm and dry, no rashes or wounds on visible skin but multiple small ecchymoses and keratoses, squamous cell skin cancers Neuro:  generalized weakness,  no cognitive impairment Psych: non-anxious affect, A and O x 3 Hem/lymph/immuno: no widespread bruising  CURRENT PROBLEM LIST:  Patient Active Problem List   Diagnosis Date Noted   Aortic stenosis 04/14/2021   Acute respiratory failure with hypoxia ([facility]) 04/11/2021   Acute kidney injury superimposed on chronic kidney disease ([facility]) 04/11/2021   Iron deficiency anemia 04/11/2021   Acute CHF (congestive heart failure) ([facility])  04/10/2021   Pacemaker battery depletion 10/11/2020   History of aortic valve replacement with bioprosthetic valve 07/23/2016   Pleural effusion, bilateral    Acute congestive heart failure ([facility])    Atrial fibrillation with RVR ([facility])    Hypoxia 12/14/2015   Hearing loss 09/02/2015   Restless leg syndrome 09/02/2015   CKD stage 3 due to type 2 diabetes mellitus ([facility]) 06/15/2015   SSS (sick sinus syndrome) ([facility]) 03/22/2015   Presbycusis of both ears 03/10/2015   Malignant neoplasm skin of face 03/10/2015   Encounter for chronic pain management 06/02/2014   Recurrent falls 06/02/2014   S/P AVR (aortic valve replacement)  05/17/2014   Typical atrial flutter ([facility]) 05/01/2014   Constipation 02/10/2014   Allergic rhinitis 08/06/2013   Chronic pain syndrome 05/22/2013   Insomnia 03/13/2013   Dry skin dermatitis 03/13/2013   Pacemaker 01/08/2013   Mild nonproliferative diabetic retinopathy(362.04) 12/05/2012   Persistent atrial fibrillation ([facility]) 06/14/2012   Long term (current) use of anticoagulants 06/14/2012   TOBACCO USER 03/01/2009   OSTEOPENIA 08/10/2008   B12 DEFICIENCY 08/07/2008   GERD 08/07/2008   Essential hypertension, benign 03/26/2008   UNSPECIFIED PROBLEMS W/LIMBS AND OTHER PROBLEMS 02/26/2008   Hyperlipidemia associated with type 2 diabetes mellitus ([facility]) 10/23/2007   ACTINIC KERATOSIS 10/23/2007   OSTEOARTHRITIS, KNEES, BILATERAL 10/23/2007   ANXIETY DEPRESSION 08/20/2007   CAD (coronary artery disease) 04/05/2007   Anemia 09/12/2006   Type 2 diabetes mellitus with hyperlipidemia ([place] [facility]) 05/08/2006   Congestive heart failure ([facility]) 05/08/2006   PAST MEDICAL HISTORY:  Active Ambulatory Problems    Diagnosis Date Noted   Type 2 diabetes mellitus with hyperlipidemia ([facility]) 05/08/2006   B12 DEFICIENCY 08/07/2008   Hyperlipidemia associated with type 2 diabetes mellitus ([place]) 10/23/2007   Anemia 09/12/2006   ANXIETY DEPRESSION 08/20/2007   TOBACCO USER 03/01/2009   Essential hypertension, benign 03/26/2008   CAD (coronary artery disease) 04/05/2007   Congestive heart failure ([facility]) 05/08/2006   GERD 08/07/2008   ACTINIC KERATOSIS 10/23/2007   OSTEOARTHRITIS, KNEES, BILATERAL 10/23/2007   OSTEOPENIA 08/10/2008   UNSPECIFIED PROBLEMS W/LIMBS AND OTHER PROBLEMS 02/26/2008   Persistent atrial fibrillation ([facility]) 06/14/2012   Long term (current) use of anticoagulants 06/14/2012   Mild nonproliferative diabetic retinopathy(362.04) 12/05/2012   Pacemaker 01/08/2013   Insomnia 03/13/2013   Dry skin dermatitis 03/13/2013   Chronic pain syndrome 05/22/2013   Allergic rhinitis 08/06/2013    Constipation 02/10/2014   Typical atrial flutter ([facility]) 05/01/2014   S/P AVR (aortic valve replacement) 05/17/2014   Encounter for chronic pain management 06/02/2014   Recurrent falls 06/02/2014   Presbycusis of both ears 03/10/2015   Malignant neoplasm skin of face 03/10/2015   SSS (sick sinus syndrome) ([facility]) 03/22/2015   CKD stage 3 due to type 2 diabetes mellitus ([facility]) 06/15/2015   Hearing loss 09/02/2015   Restless leg syndrome 09/02/2015   Hypoxia 12/14/2015   Acute congestive heart failure (HCC)    Atrial fibrillation with RVR (HCC)    Pleural effusion, bilateral    History of aortic valve replacement with bioprosthetic valve 07/23/2016   Pacemaker battery depletion 10/11/2020   Acute CHF (congestive heart failure) ([place] [facility]) 04/10/2021   Acute respiratory failure with hypoxia ([facility]) 04/11/2021   Acute kidney injury superimposed on chronic kidney disease ([facility]) 04/11/2021   Iron deficiency anemia 04/11/2021   Aortic stenosis 04/14/2021   Resolved Ambulatory Problems    Diagnosis Date Noted   RESTLESS LEG SYNDROME 11/22/2007   GAIT IMBALANCE 09/07/2009   MECH  COMPLICATION DUE HEART VALVE PROSTHESIS 11/22/2007   Seasonal allergies 06/05/2010   Dysuria 02/12/2011   Major depression 07/25/2011   Congestion of upper airway 03/10/2012   Pacemaker 01/08/2013   Sinusitis, acute, maxillary 04/02/2014   UTI (urinary tract infection) 09/02/2014   Past Medical History:  Diagnosis Date   Anxiety    CHF (congestive heart failure) (HCC)    DM2 (diabetes mellitus, type 2) (HCC)    GERD (gastroesophageal reflux disease)    HLD (hyperlipidemia)    HTN (hypertension)    PAF (paroxysmal atrial fibrillation) (HCC)    Tobacco abuse    SOCIAL HX:  Social History   Tobacco Use   Smoking status: Never   Smokeless tobacco: Current    Types: Snuff  Substance Use Topics   Alcohol use: No    Alcohol/week: 0.0 standard drinks     ALLERGIES:  Allergies  Allergen Reactions   Escitalopram      Insomnia / paranoid    Pravachol [Pravastatin Sodium] Other (See Comments)    myalgias   Tape Other (See Comments)    Patient states that she has skin cancer and that it is thin (PLEASE USE COBAN WRAP INSTEAD OF TAPE!)     PERTINENT MEDICATIONS:  Outpatient Encounter Medications as of 06/07/2021  Medication Sig   ALPRAZolam (XANAX) 1 MG tablet Take 0.5 tablets (0.5 mg total) by mouth 2 (two) times daily as needed for anxiety. (Patient taking differently: Take 1 mg by mouth at bedtime as needed for anxiety.)   apixaban (ELIQUIS) 2.5 MG TABS tablet TAKE 1 TABLET (2.5 MG TOTAL) BY MOUTH 2 (TWO) TIMES DAILY. (Patient taking differently: Take 2.5 mg by mouth 2 (two) times daily.)   Blood Glucose Monitoring Suppl (ONE TOUCH ULTRA 2) W/DEVICE KIT    Cyanocobalamin (VITAMIN DEFICIENCY SYSTEM-B12 IJ) Inject 1 Dose as directed every 30 (thirty) days. Not sure about dosage   Evolocumab (REPATHA SURECLICK) 415 MG/ML SOAJ INJECT 140 MG INTO THE SKIN EVERY 14 (FOURTEEN) DAYS.   ezetimibe (ZETIA) 10 MG tablet TAKE 1 TABLET (10 MG TOTAL) BY MOUTH DAILY.   ferrous sulfate 325 (65 FE) MG tablet Take 1 tablet (325 mg total) by mouth daily with breakfast.   fluticasone (FLONASE) 50 MCG/ACT nasal spray Place 1 spray into both nostrils daily as needed for allergies. (Patient not taking: Reported on 05/02/2021)   furosemide (LASIX) 20 MG tablet Take 1 tablet (20 mg total) by mouth daily. Take 40 mg for a weight over 140 lbs.   linaclotide (LINZESS) 145 MCG CAPS capsule Take 145 mcg by mouth daily as needed (constipation).   metFORMIN (GLUCOPHAGE) 500 MG tablet TAKE 1 TABLET (500 MG TOTAL) BY MOUTH 2 (TWO) TIMES DAILY WITH A MEAL.   metoprolol tartrate (LOPRESSOR) 100 MG tablet Take 1 tablet (100 mg total) by mouth 2 (two) times daily.   omeprazole (PRILOSEC) 20 MG capsule TAKE 1 CAPSULE BY MOUTH EVERY DAY (Patient taking differently: 20 mg daily.)   ONE TOUCH ULTRA TEST test strip TEST BLOOD SUGAR TWICE A DAY  (Patient not taking: Reported on 05/02/2021)   potassium chloride SA (KLOR-CON M) 20 MEQ tablet Take 2 tablets (40 mEq total) by mouth daily.   No facility-administered encounter medications on file as of 06/07/2021.   Thank you for the opportunity to participate in the care of [name].  The palliative care team will continue to follow. Please call our office at [phone] if we can be of additional assistance.   Hollace Kinnier,  DO  COVID-19 PATIENT SCREENING TOOL Asked and negative response unless otherwise noted:  Have you had symptoms of covid, tested positive or been in contact with someone with symptoms/positive test in the past 5-10 days? no

## 2021-06-08 ENCOUNTER — Encounter: Payer: Self-pay | Admitting: Internal Medicine

## 2021-06-13 DIAGNOSIS — F17210 Nicotine dependence, cigarettes, uncomplicated: Secondary | ICD-10-CM | POA: Diagnosis not present

## 2021-06-13 DIAGNOSIS — I495 Sick sinus syndrome: Secondary | ICD-10-CM | POA: Diagnosis not present

## 2021-06-13 DIAGNOSIS — I130 Hypertensive heart and chronic kidney disease with heart failure and stage 1 through stage 4 chronic kidney disease, or unspecified chronic kidney disease: Secondary | ICD-10-CM | POA: Diagnosis not present

## 2021-06-13 DIAGNOSIS — I350 Nonrheumatic aortic (valve) stenosis: Secondary | ICD-10-CM | POA: Diagnosis not present

## 2021-06-13 DIAGNOSIS — Z950 Presence of cardiac pacemaker: Secondary | ICD-10-CM | POA: Diagnosis not present

## 2021-06-13 DIAGNOSIS — Z9181 History of falling: Secondary | ICD-10-CM | POA: Diagnosis not present

## 2021-06-13 DIAGNOSIS — Z7984 Long term (current) use of oral hypoglycemic drugs: Secondary | ICD-10-CM | POA: Diagnosis not present

## 2021-06-13 DIAGNOSIS — Z7901 Long term (current) use of anticoagulants: Secondary | ICD-10-CM | POA: Diagnosis not present

## 2021-06-13 DIAGNOSIS — N1832 Chronic kidney disease, stage 3b: Secondary | ICD-10-CM | POA: Diagnosis not present

## 2021-06-13 DIAGNOSIS — E1122 Type 2 diabetes mellitus with diabetic chronic kidney disease: Secondary | ICD-10-CM | POA: Diagnosis not present

## 2021-06-13 DIAGNOSIS — K219 Gastro-esophageal reflux disease without esophagitis: Secondary | ICD-10-CM | POA: Diagnosis not present

## 2021-06-13 DIAGNOSIS — I480 Paroxysmal atrial fibrillation: Secondary | ICD-10-CM | POA: Diagnosis not present

## 2021-06-13 DIAGNOSIS — G2581 Restless legs syndrome: Secondary | ICD-10-CM | POA: Diagnosis not present

## 2021-06-13 DIAGNOSIS — I4892 Unspecified atrial flutter: Secondary | ICD-10-CM | POA: Diagnosis not present

## 2021-06-13 DIAGNOSIS — E785 Hyperlipidemia, unspecified: Secondary | ICD-10-CM | POA: Diagnosis not present

## 2021-06-13 DIAGNOSIS — I509 Heart failure, unspecified: Secondary | ICD-10-CM | POA: Diagnosis not present

## 2021-06-13 DIAGNOSIS — D631 Anemia in chronic kidney disease: Secondary | ICD-10-CM | POA: Diagnosis not present

## 2021-06-13 DIAGNOSIS — I2510 Atherosclerotic heart disease of native coronary artery without angina pectoris: Secondary | ICD-10-CM | POA: Diagnosis not present

## 2021-06-16 DIAGNOSIS — Z7901 Long term (current) use of anticoagulants: Secondary | ICD-10-CM | POA: Diagnosis not present

## 2021-06-16 DIAGNOSIS — Z950 Presence of cardiac pacemaker: Secondary | ICD-10-CM | POA: Diagnosis not present

## 2021-06-16 DIAGNOSIS — E785 Hyperlipidemia, unspecified: Secondary | ICD-10-CM | POA: Diagnosis not present

## 2021-06-16 DIAGNOSIS — K219 Gastro-esophageal reflux disease without esophagitis: Secondary | ICD-10-CM | POA: Diagnosis not present

## 2021-06-16 DIAGNOSIS — I350 Nonrheumatic aortic (valve) stenosis: Secondary | ICD-10-CM | POA: Diagnosis not present

## 2021-06-16 DIAGNOSIS — I480 Paroxysmal atrial fibrillation: Secondary | ICD-10-CM | POA: Diagnosis not present

## 2021-06-16 DIAGNOSIS — I2510 Atherosclerotic heart disease of native coronary artery without angina pectoris: Secondary | ICD-10-CM | POA: Diagnosis not present

## 2021-06-16 DIAGNOSIS — D631 Anemia in chronic kidney disease: Secondary | ICD-10-CM | POA: Diagnosis not present

## 2021-06-16 DIAGNOSIS — E1122 Type 2 diabetes mellitus with diabetic chronic kidney disease: Secondary | ICD-10-CM | POA: Diagnosis not present

## 2021-06-16 DIAGNOSIS — F17210 Nicotine dependence, cigarettes, uncomplicated: Secondary | ICD-10-CM | POA: Diagnosis not present

## 2021-06-16 DIAGNOSIS — I509 Heart failure, unspecified: Secondary | ICD-10-CM | POA: Diagnosis not present

## 2021-06-16 DIAGNOSIS — Z9181 History of falling: Secondary | ICD-10-CM | POA: Diagnosis not present

## 2021-06-16 DIAGNOSIS — I4892 Unspecified atrial flutter: Secondary | ICD-10-CM | POA: Diagnosis not present

## 2021-06-16 DIAGNOSIS — I130 Hypertensive heart and chronic kidney disease with heart failure and stage 1 through stage 4 chronic kidney disease, or unspecified chronic kidney disease: Secondary | ICD-10-CM | POA: Diagnosis not present

## 2021-06-16 DIAGNOSIS — N1832 Chronic kidney disease, stage 3b: Secondary | ICD-10-CM | POA: Diagnosis not present

## 2021-06-16 DIAGNOSIS — G2581 Restless legs syndrome: Secondary | ICD-10-CM | POA: Diagnosis not present

## 2021-06-16 DIAGNOSIS — I495 Sick sinus syndrome: Secondary | ICD-10-CM | POA: Diagnosis not present

## 2021-06-16 DIAGNOSIS — Z7984 Long term (current) use of oral hypoglycemic drugs: Secondary | ICD-10-CM | POA: Diagnosis not present

## 2021-06-27 DIAGNOSIS — I739 Peripheral vascular disease, unspecified: Secondary | ICD-10-CM | POA: Diagnosis not present

## 2021-06-27 DIAGNOSIS — I10 Essential (primary) hypertension: Secondary | ICD-10-CM | POA: Diagnosis not present

## 2021-06-27 DIAGNOSIS — I130 Hypertensive heart and chronic kidney disease with heart failure and stage 1 through stage 4 chronic kidney disease, or unspecified chronic kidney disease: Secondary | ICD-10-CM | POA: Diagnosis not present

## 2021-06-27 DIAGNOSIS — M6281 Muscle weakness (generalized): Secondary | ICD-10-CM | POA: Diagnosis not present

## 2021-06-27 DIAGNOSIS — E1122 Type 2 diabetes mellitus with diabetic chronic kidney disease: Secondary | ICD-10-CM | POA: Diagnosis not present

## 2021-06-27 DIAGNOSIS — I509 Heart failure, unspecified: Secondary | ICD-10-CM | POA: Diagnosis not present

## 2021-06-27 DIAGNOSIS — I4891 Unspecified atrial fibrillation: Secondary | ICD-10-CM | POA: Diagnosis not present

## 2021-06-27 DIAGNOSIS — N1832 Chronic kidney disease, stage 3b: Secondary | ICD-10-CM | POA: Diagnosis not present

## 2021-06-27 DIAGNOSIS — E875 Hyperkalemia: Secondary | ICD-10-CM | POA: Diagnosis not present

## 2021-06-27 DIAGNOSIS — Z6823 Body mass index (BMI) 23.0-23.9, adult: Secondary | ICD-10-CM | POA: Diagnosis not present

## 2021-06-27 DIAGNOSIS — Z9181 History of falling: Secondary | ICD-10-CM | POA: Diagnosis not present

## 2021-07-13 ENCOUNTER — Ambulatory Visit (INDEPENDENT_AMBULATORY_CARE_PROVIDER_SITE_OTHER): Payer: Medicare Other

## 2021-07-13 DIAGNOSIS — I495 Sick sinus syndrome: Secondary | ICD-10-CM

## 2021-07-16 DIAGNOSIS — N189 Chronic kidney disease, unspecified: Secondary | ICD-10-CM | POA: Diagnosis not present

## 2021-07-16 DIAGNOSIS — N179 Acute kidney failure, unspecified: Secondary | ICD-10-CM | POA: Diagnosis not present

## 2021-07-16 DIAGNOSIS — J9601 Acute respiratory failure with hypoxia: Secondary | ICD-10-CM | POA: Diagnosis not present

## 2021-07-17 LAB — CUP PACEART REMOTE DEVICE CHECK
Battery Remaining Longevity: 75 mo
Battery Remaining Percentage: 92 %
Battery Voltage: 2.98 V
Brady Statistic AP VP Percent: 41 %
Brady Statistic AP VS Percent: 49 %
Brady Statistic AS VP Percent: 1 %
Brady Statistic AS VS Percent: 8.2 %
Brady Statistic RA Percent Paced: 46 %
Brady Statistic RV Percent Paced: 54 %
Date Time Interrogation Session: 20230519191639
Lead Channel Impedance Value: 360 Ohm
Lead Channel Impedance Value: 390 Ohm
Lead Channel Pacing Threshold Amplitude: 1.25 V
Lead Channel Pacing Threshold Amplitude: 1.625 V
Lead Channel Pacing Threshold Pulse Width: 0.4 ms
Lead Channel Pacing Threshold Pulse Width: 1 ms
Lead Channel Sensing Intrinsic Amplitude: 1.4 mV
Lead Channel Sensing Intrinsic Amplitude: 8.7 mV
Lead Channel Setting Pacing Amplitude: 1.5 V
Lead Channel Setting Pacing Amplitude: 2.5 V
Lead Channel Setting Pacing Pulse Width: 1 ms
Lead Channel Setting Sensing Sensitivity: 2 mV
Pulse Gen Model: 2272
Pulse Gen Serial Number: 3943401

## 2021-07-26 ENCOUNTER — Other Ambulatory Visit: Payer: Medicare Other | Admitting: Internal Medicine

## 2021-07-27 NOTE — Progress Notes (Signed)
Remote pacemaker transmission.   

## 2021-08-11 ENCOUNTER — Other Ambulatory Visit: Payer: Medicare Other | Admitting: Hospice

## 2021-08-11 DIAGNOSIS — I5032 Chronic diastolic (congestive) heart failure: Secondary | ICD-10-CM | POA: Diagnosis not present

## 2021-08-11 DIAGNOSIS — Z515 Encounter for palliative care: Secondary | ICD-10-CM | POA: Diagnosis not present

## 2021-08-11 DIAGNOSIS — I482 Chronic atrial fibrillation, unspecified: Secondary | ICD-10-CM | POA: Diagnosis not present

## 2021-08-11 DIAGNOSIS — E118 Type 2 diabetes mellitus with unspecified complications: Secondary | ICD-10-CM | POA: Diagnosis not present

## 2021-08-11 DIAGNOSIS — F419 Anxiety disorder, unspecified: Secondary | ICD-10-CM

## 2021-08-11 NOTE — Progress Notes (Signed)
[facility] Palliative Care Follow-Up Visit Telephone: [phone]  Fax: [phone]   Date of encounter: 08/11/21 10:44 AM PATIENT NAME: [name]   [phone] (home)  DOB: 1929-08-21 MRN: [id number] PRIMARY CARE PROVIDER:    Imagene Riches, NP,  [place] [address] [phone]  REFERRING PROVIDER:   Imagene Riches, NP [place],  [place] [address] [phone]  RESPONSIBLE PARTY:   Olene Floss [id number] Contact Information     Name Relation Home Work Mobile   [place] Daughter   [phone]   Lazarus Salines Daughter   [phone]        I met face to face with patient and family in her home/facility. Palliative Care was asked to follow this patient by consultation request of  York, Regina F, NP to address advance care planning and complex medical decision making.  [name] is home with patient during visit.  This is follow-up visit.                                     ASSESSMENT AND PLAN / RECOMMENDATIONS:   Advance Care Planning: Our advance care planning conversation included a discussion about:    The value and importance of advance care planning  Difference between Hospice and Palliative care Exploration of goals of care in the event of a sudden injury or illness  Identification and preparation of a healthcare agent  Review and updating or creation of an  advance directive document . Decision not to resuscitate or to de-escalate disease focused treatments due to poor prognosis.  CODE STATUS: CODE STATUS reviewed.  Patient is a DO NOT RESUSCITATE.  Signed DNR form uploaded to epic today.  Goals of Care: Goals include to maximize quality of life and symptom management. MOST form selections include DNR, comfort measures, abx determine case by case, IVF trial, no tube feeding  I spent  16 minutes providing this initial consultation. More than 50% of the time in  this consultation was spent on counseling patient and coordinating communication. ----------------------------------------------------------------------------------------------------------------------------------------------------- Symptom Management/Plan: Chronic diastolic CHF: Continue Lasix as ordered. Adhere to salt and fluid limits.  Elevate BLE to promote circulation.  Weight is fairly consistent 138-139 pounds in the past month.  Monitor and reports  edema/weight gain of 2 pounds in a day or 5 pounds in a week.  Routine CBC BMP. Type 2 DM: Controlled. A1c 5.9 04/11/21. Continue Metformin as ordered. Continue diabetic diet, no concentrated sweets.  A1c every 3 months  Afib: Managed with Eliquis.  Monitor and report bleeding, palpitations, headaches.  Anxiety: Continue Xanax as ordered. Encourage redirection, slow deep breathing.  History of aortic valve replacement with bioprosthetic valve -prior valve replacement now stenotic, not a candidate for surgery per cardiology due to poor prognosis.  Patient is followed by cardiologist.  Follow up Palliative Care Visit: Palliative care will continue to follow for complex medical decision making, advance care planning, and clarification of goals. Return 6 weeks or prn.   Family support system: Patient lives at home with her daughter [name]. Grandson and other family members visit and attend to patient. Strong family support system identified.   PPS: 50%  HOSPICE ELIGIBILITY/DIAGNOSIS: TBD/CHF with non-operable bioprosthetic AS  Chief Complaint: Follow-up palliative visit  HISTORY OF PRESENT ILLNESS:  [name] is a 88 y.o. year old female  with  multiple morbidities requiring close monitoring/management with high risk of complications and morbidity: Diastolic chf, nonoperable bioprosthetic AS, h/o recent admission with bilateral pleural effusions,  afib, type 2 diabetes mellitus. History obtained from review of EMR, discussion with primary  team, and interview with family, facility staff/caregiver and/or Ms. Wynonia Lawman.  Independent interpretation of tests, reviewed available labs, medications, imaging, studies and related documents from the EMR.  Records reviewed and summarized above.    CURRENT PROBLEM LIST:  Patient Active Problem List   Diagnosis Date Noted   Aortic stenosis 04/14/2021   Acute respiratory failure with hypoxia ([place]) 04/11/2021   Acute kidney injury superimposed on chronic kidney disease ([place]) 04/11/2021   Iron deficiency anemia 04/11/2021   Acute CHF (congestive heart failure) ([place]) 04/10/2021   Pacemaker battery depletion 10/11/2020   History of aortic valve replacement with bioprosthetic valve 07/23/2016   Pleural effusion, bilateral    Acute congestive heart failure (HCC)    Atrial fibrillation with RVR ([place])    Hypoxia 12/14/2015   Hearing loss 09/02/2015   Restless leg syndrome 09/02/2015   CKD stage 3 due to type 2 diabetes mellitus ([place]) 06/15/2015   SSS (sick sinus syndrome) ([place]) 03/22/2015   Presbycusis of both ears 03/10/2015   Malignant neoplasm skin of face 03/10/2015   Encounter for chronic pain management 06/02/2014   Recurrent falls 06/02/2014   S/P AVR (aortic valve replacement) 05/17/2014   Typical atrial flutter ([place]) 05/01/2014   Constipation 02/10/2014   Allergic rhinitis 08/06/2013   Chronic pain syndrome 05/22/2013   Insomnia 03/13/2013   Dry skin dermatitis 03/13/2013   Pacemaker 01/08/2013   Mild nonproliferative diabetic retinopathy(362.04) 12/05/2012   Persistent atrial fibrillation ([place]) 06/14/2012   Long term (current) use of anticoagulants 06/14/2012   TOBACCO USER 03/01/2009   OSTEOPENIA 08/10/2008   B12 DEFICIENCY 08/07/2008   GERD 08/07/2008   Essential hypertension, benign 03/26/2008   UNSPECIFIED PROBLEMS W/LIMBS AND OTHER PROBLEMS 02/26/2008   Hyperlipidemia associated with type 2 diabetes mellitus ([place]) 10/23/2007   ACTINIC KERATOSIS 10/23/2007    OSTEOARTHRITIS, KNEES, BILATERAL 10/23/2007   ANXIETY DEPRESSION 08/20/2007   CAD (coronary artery disease) 04/05/2007   Anemia 09/12/2006   Type 2 diabetes mellitus with hyperlipidemia ([place]) 05/08/2006   Congestive heart failure ([place]) 05/08/2006   PAST MEDICAL HISTORY:  Active Ambulatory Problems    Diagnosis Date Noted   Type 2 diabetes mellitus with hyperlipidemia ([place]) 05/08/2006   B12 DEFICIENCY 08/07/2008   Hyperlipidemia associated with type 2 diabetes mellitus ([place]) 10/23/2007   Anemia 09/12/2006   ANXIETY DEPRESSION 08/20/2007   TOBACCO USER 03/01/2009   Essential hypertension, benign 03/26/2008   CAD (coronary artery disease) 04/05/2007   Congestive heart failure ([place]) 05/08/2006   GERD 08/07/2008   ACTINIC KERATOSIS 10/23/2007   OSTEOARTHRITIS, KNEES, BILATERAL 10/23/2007   OSTEOPENIA 08/10/2008   UNSPECIFIED PROBLEMS W/LIMBS AND OTHER PROBLEMS 02/26/2008   Persistent atrial fibrillation ([place]) 06/14/2012   Long term (current) use of anticoagulants 06/14/2012   Mild nonproliferative diabetic retinopathy(362.04) 12/05/2012   Pacemaker 01/08/2013   Insomnia 03/13/2013   Dry skin dermatitis 03/13/2013   Chronic pain syndrome 05/22/2013   Allergic rhinitis 08/06/2013   Constipation 02/10/2014   Typical atrial flutter ([place]) 05/01/2014   S/P AVR (aortic valve replacement) 05/17/2014   Encounter for chronic pain management 06/02/2014   Recurrent falls 06/02/2014   Presbycusis of both ears 03/10/2015   Malignant neoplasm skin of face 03/10/2015   SSS (sick sinus syndrome) ([place]) 03/22/2015   CKD stage 3  due to type 2 diabetes mellitus ([place]) 06/15/2015   Hearing loss 09/02/2015   Restless leg syndrome 09/02/2015   Hypoxia 12/14/2015   Acute congestive heart failure (HCC)    Atrial fibrillation with RVR (HCC)    Pleural effusion, bilateral    History of aortic valve replacement with bioprosthetic valve 07/23/2016   Pacemaker battery depletion 10/11/2020   Acute CHF  (congestive heart failure) ([place]) 04/10/2021   Acute respiratory failure with hypoxia ([place]) 04/11/2021   Acute kidney injury superimposed on chronic kidney disease ([place]) 04/11/2021   Iron deficiency anemia 04/11/2021   Aortic stenosis 04/14/2021   Resolved Ambulatory Problems    Diagnosis Date Noted   RESTLESS LEG SYNDROME 11/22/2007   GAIT IMBALANCE 57/32/2025   [facility] COMPLICATION DUE HEART VALVE PROSTHESIS 11/22/2007   Seasonal allergies 06/05/2010   Dysuria 02/12/2011   Major depression 07/25/2011   Congestion of upper airway 03/10/2012   Pacemaker 01/08/2013   Sinusitis, acute, maxillary 04/02/2014   UTI (urinary tract infection) 09/02/2014   Past Medical History:  Diagnosis Date   Anxiety    CHF (congestive heart failure) (HCC)    DM2 (diabetes mellitus, type 2) (HCC)    GERD (gastroesophageal reflux disease)    HLD (hyperlipidemia)    HTN (hypertension)    PAF (paroxysmal atrial fibrillation) (HCC)    Tobacco abuse    SOCIAL HX:  Social History   Tobacco Use   Smoking status: Never   Smokeless tobacco: Current    Types: Snuff  Substance Use Topics   Alcohol use: No    Alcohol/week: 0.0 standard drinks of alcohol     ALLERGIES:  Allergies  Allergen Reactions   Escitalopram     Insomnia / paranoid    Pravachol [Pravastatin Sodium] Other (See Comments)    myalgias   Tape Other (See Comments)    Patient states that she has skin cancer and that it is thin (PLEASE USE COBAN WRAP INSTEAD OF TAPE!)     PERTINENT MEDICATIONS:  Outpatient Encounter Medications as of 08/11/2021  Medication Sig   ALPRAZolam (XANAX) 1 MG tablet Take 0.5 tablets (0.5 mg total) by mouth 2 (two) times daily as needed for anxiety. (Patient taking differently: Take 1 mg by mouth at bedtime as needed for anxiety.)   apixaban (ELIQUIS) 2.5 MG TABS tablet TAKE 1 TABLET (2.5 MG TOTAL) BY MOUTH 2 (TWO) TIMES DAILY. (Patient taking differently: Take 2.5 mg by mouth 2 (two) times daily.)   Blood  Glucose Monitoring Suppl (ONE TOUCH ULTRA 2) W/DEVICE KIT    Cyanocobalamin (VITAMIN DEFICIENCY SYSTEM-B12 IJ) Inject 1 Dose as directed every 30 (thirty) days. Not sure about dosage   Evolocumab (REPATHA SURECLICK) 427 MG/ML SOAJ INJECT 140 MG INTO THE SKIN EVERY 14 (FOURTEEN) DAYS.   ezetimibe (ZETIA) 10 MG tablet TAKE 1 TABLET (10 MG TOTAL) BY MOUTH DAILY.   ferrous sulfate 325 (65 FE) MG tablet Take 1 tablet (325 mg total) by mouth daily with breakfast.   fluticasone (FLONASE) 50 MCG/ACT nasal spray Place 1 spray into both nostrils daily as needed for allergies. (Patient not taking: Reported on 05/02/2021)   furosemide (LASIX) 20 MG tablet Take 1 tablet (20 mg total) by mouth daily. Take 40 mg for a weight over 140 lbs.   linaclotide (LINZESS) 145 MCG CAPS capsule Take 145 mcg by mouth daily as needed (constipation).   metFORMIN (GLUCOPHAGE) 500 MG tablet TAKE 1 TABLET (500 MG TOTAL) BY MOUTH 2 (TWO) TIMES DAILY WITH A MEAL.  metoprolol tartrate (LOPRESSOR) 100 MG tablet Take 1 tablet (100 mg total) by mouth 2 (two) times daily.   omeprazole (PRILOSEC) 20 MG capsule TAKE 1 CAPSULE BY MOUTH EVERY DAY (Patient taking differently: 20 mg daily.)   ONE TOUCH ULTRA TEST test strip TEST BLOOD SUGAR TWICE A DAY (Patient not taking: Reported on 05/02/2021)   potassium chloride SA (KLOR-CON M) 20 MEQ tablet Take 2 tablets (40 mEq total) by mouth daily.   No facility-administered encounter medications on file as of 08/11/2021.   Thank you for the opportunity to participate in the care of Ms. Wynonia Lawman.  The palliative care team will continue to follow. Please call our office at [phone] if we can be of additional assistance.   Teodoro Spray, NP

## 2021-08-16 DIAGNOSIS — J9601 Acute respiratory failure with hypoxia: Secondary | ICD-10-CM | POA: Diagnosis not present

## 2021-08-16 DIAGNOSIS — N189 Chronic kidney disease, unspecified: Secondary | ICD-10-CM | POA: Diagnosis not present

## 2021-08-16 DIAGNOSIS — N179 Acute kidney failure, unspecified: Secondary | ICD-10-CM | POA: Diagnosis not present

## 2021-08-20 DIAGNOSIS — M6281 Muscle weakness (generalized): Secondary | ICD-10-CM | POA: Diagnosis not present

## 2021-08-20 DIAGNOSIS — I130 Hypertensive heart and chronic kidney disease with heart failure and stage 1 through stage 4 chronic kidney disease, or unspecified chronic kidney disease: Secondary | ICD-10-CM | POA: Diagnosis not present

## 2021-08-20 DIAGNOSIS — I509 Heart failure, unspecified: Secondary | ICD-10-CM | POA: Diagnosis not present

## 2021-08-20 DIAGNOSIS — Z9181 History of falling: Secondary | ICD-10-CM | POA: Diagnosis not present

## 2021-08-20 DIAGNOSIS — E1122 Type 2 diabetes mellitus with diabetic chronic kidney disease: Secondary | ICD-10-CM | POA: Diagnosis not present

## 2021-09-01 DIAGNOSIS — I4891 Unspecified atrial fibrillation: Secondary | ICD-10-CM | POA: Diagnosis not present

## 2021-09-01 DIAGNOSIS — I10 Essential (primary) hypertension: Secondary | ICD-10-CM | POA: Diagnosis not present

## 2021-09-01 DIAGNOSIS — E875 Hyperkalemia: Secondary | ICD-10-CM | POA: Diagnosis not present

## 2021-09-01 DIAGNOSIS — Z6823 Body mass index (BMI) 23.0-23.9, adult: Secondary | ICD-10-CM | POA: Diagnosis not present

## 2021-09-01 DIAGNOSIS — D513 Other dietary vitamin B12 deficiency anemia: Secondary | ICD-10-CM | POA: Diagnosis not present

## 2021-09-01 DIAGNOSIS — N1832 Chronic kidney disease, stage 3b: Secondary | ICD-10-CM | POA: Diagnosis not present

## 2021-09-01 DIAGNOSIS — Q845 Enlarged and hypertrophic nails: Secondary | ICD-10-CM | POA: Diagnosis not present

## 2021-09-01 DIAGNOSIS — I739 Peripheral vascular disease, unspecified: Secondary | ICD-10-CM | POA: Diagnosis not present

## 2021-09-01 DIAGNOSIS — E611 Iron deficiency: Secondary | ICD-10-CM | POA: Diagnosis not present

## 2021-09-15 ENCOUNTER — Telehealth: Payer: Medicare Other | Admitting: Hospice

## 2021-09-15 DIAGNOSIS — N189 Chronic kidney disease, unspecified: Secondary | ICD-10-CM | POA: Diagnosis not present

## 2021-09-15 DIAGNOSIS — F419 Anxiety disorder, unspecified: Secondary | ICD-10-CM

## 2021-09-15 DIAGNOSIS — I5032 Chronic diastolic (congestive) heart failure: Secondary | ICD-10-CM | POA: Diagnosis not present

## 2021-09-15 DIAGNOSIS — J9601 Acute respiratory failure with hypoxia: Secondary | ICD-10-CM | POA: Diagnosis not present

## 2021-09-15 DIAGNOSIS — I482 Chronic atrial fibrillation, unspecified: Secondary | ICD-10-CM

## 2021-09-15 DIAGNOSIS — N179 Acute kidney failure, unspecified: Secondary | ICD-10-CM | POA: Diagnosis not present

## 2021-09-15 DIAGNOSIS — E118 Type 2 diabetes mellitus with unspecified complications: Secondary | ICD-10-CM | POA: Diagnosis not present

## 2021-09-15 DIAGNOSIS — Z515 Encounter for palliative care: Secondary | ICD-10-CM | POA: Diagnosis not present

## 2021-09-15 NOTE — Progress Notes (Signed)
[facility] Palliative Care Follow-Up Visit Telephone: [phone]  Fax: [phone]   Date of encounter: 09/15/21 1:34 PM PATIENT NAME: [name] [place] [address]   [phone] (home)  DOB: 03-16-1929 MRN: [id number] PRIMARY CARE PROVIDER:    Imagene Riches, [place],  [place] [address] [phone]  REFERRING PROVIDER:   Imagene Riches, NP [place],  [place] [address] [phone]  RESPONSIBLE PARTY:   [name] [phone] Contact Information     Name Relation Home Work Mobile   [name] Daughter   [phone]   [name] Daughter   [phone]       TELEHEALTH VISIT STATEMENT Due to the COVID-19 crisis, this visit was done via telemedicine from my office and it was initiated and consent by this patient and or family.  I connected with patient OR PROXY by a telephone/video  and verified that I am speaking with the correct person. I discussed the limitations of evaluation and management by telemedicine. Patient/proxy expressed understanding and agreed to proceed. Palliative Care was asked to follow this patient to address advance care planning, complex medical decision making and goals of care clarification.   [name] is home with patient during visit.  This is follow-up visit.            ASSESSMENT AND PLAN / RECOMMENDATIONS:   Advance Care Planning: Our advance care planning conversation included a discussion about:     CODE STATUS: CODE STATUS reviewed.  Patient is a DO NOT RESUSCITATE.  Signed DNR form uploaded to epic today.  Goals of Care: Goals include to maximize quality of life and symptom management.M OST form selections include DNR, comfort measures, abx determine case by case, IVF trial, no tube feeding  Symptom Management/Plan: Chronic diastolic CHF: No exacerbation. Continue Lasix 20 mg daily as ordered. Take additional dose with edema, weight gain or respiratory  distress and notify provider. Adhere to salt and fluid limits.  Elevate BLE to promote circulation.  Weight is fairly consistent 138-139 pounds in the past month.  Monitor and reports  edema/weight gain of 2 pounds in a day or 5 pounds in a week.  Routine CBC BMP. Type 2 DM: Controlled. A1c 5.9 04/11/21. Continue Metformin as ordered. Continue diabetic diet, no concentrated sweets.  Recheck A1c.   Afib: Managed with Eliquis.  Monitor and report bleeding, palpitations, headaches. Pacemaker present. Cardiologist office to replace the battery/signalling.  Anxiety: Continue Xanax as ordered. Encourage redirection, slow deep breathing.  History of aortic valve replacement with bioprosthetic valve -prior valve replacement now stenotic, not a candidate for surgery per cardiology due to poor prognosis.  Patient is followed by cardiologist.  Follow up Palliative Care Visit: Palliative care will continue to follow for complex medical decision making, advance care planning, and clarification of goals. [name] will call with concerns.   Family support system: Patient lives at home with her daughter [name]. Grandson and other family members visit and attend to patient. Strong family support system identified.   PPS: 50%  HOSPICE ELIGIBILITY/DIAGNOSIS: TBD/CHF with non-operable bioprosthetic AS  Chief Complaint: Follow-up palliative visit  HISTORY OF PRESENT ILLNESS:  SHAUNTEA LOK is a 88 y.o. year old female  with multiple morbidities requiring close monitoring/management with high risk of complications and morbidity: Diastolic chf, nonoperable bioprosthetic AS, h/o recent admission with bilateral pleural effusions,  afib, type 2 diabetes mellitus. History obtained from review of EMR, discussion with primary team, and  interview with family, facility staff/caregiver and/or [name].  Independent interpretation of tests, reviewed available labs, medications, imaging, studies and related documents from the  EMR.  Records reviewed and summarized above.    CURRENT PROBLEM LIST:  Patient Active Problem List   Diagnosis Date Noted   Aortic stenosis 04/14/2021   Acute respiratory failure with hypoxia ([place]) 04/11/2021   Acute kidney injury superimposed on chronic kidney disease ([place]) 04/11/2021   Iron deficiency anemia 04/11/2021   Acute CHF (congestive heart failure) ([place]) 04/10/2021   Pacemaker battery depletion 10/11/2020   History of aortic valve replacement with bioprosthetic valve 07/23/2016   Pleural effusion, bilateral    Acute congestive heart failure (HCC)    Atrial fibrillation with RVR ([place])    Hypoxia 12/14/2015   Hearing loss 09/02/2015   Restless leg syndrome 09/02/2015   CKD stage 3 due to type 2 diabetes mellitus ([place]) 06/15/2015   SSS (sick sinus syndrome) ([place]) 03/22/2015   Presbycusis of both ears 03/10/2015   Malignant neoplasm skin of face 03/10/2015   Encounter for chronic pain management 06/02/2014   Recurrent falls 06/02/2014   S/P AVR (aortic valve replacement) 05/17/2014   Typical atrial flutter ([place]) 05/01/2014   Constipation 02/10/2014   Allergic rhinitis 08/06/2013   Chronic pain syndrome 05/22/2013   Insomnia 03/13/2013   Dry skin dermatitis 03/13/2013   Pacemaker 01/08/2013   Mild nonproliferative diabetic retinopathy(362.04) 12/05/2012   Persistent atrial fibrillation ([place]) 06/14/2012   Long term (current) use of anticoagulants 06/14/2012   TOBACCO USER 03/01/2009   OSTEOPENIA 08/10/2008   B12 DEFICIENCY 08/07/2008   GERD 08/07/2008   Essential hypertension, benign 03/26/2008   UNSPECIFIED PROBLEMS W/LIMBS AND OTHER PROBLEMS 02/26/2008   Hyperlipidemia associated with type 2 diabetes mellitus ([place]) 10/23/2007   ACTINIC KERATOSIS 10/23/2007   OSTEOARTHRITIS, KNEES, BILATERAL 10/23/2007   ANXIETY DEPRESSION 08/20/2007   CAD (coronary artery disease) 04/05/2007   Anemia 09/12/2006   Type 2 diabetes mellitus with hyperlipidemia ([place]) 05/08/2006    Congestive heart failure ([place]) 05/08/2006   PAST MEDICAL HISTORY:  Active Ambulatory Problems    Diagnosis Date Noted   Type 2 diabetes mellitus with hyperlipidemia ([place]) 05/08/2006   B12 DEFICIENCY 08/07/2008   Hyperlipidemia associated with type 2 diabetes mellitus ([place]) 10/23/2007   Anemia 09/12/2006   ANXIETY DEPRESSION 08/20/2007   TOBACCO USER 03/01/2009   Essential hypertension, benign 03/26/2008   CAD (coronary artery disease) 04/05/2007   Congestive heart failure ([place]) 05/08/2006   GERD 08/07/2008   ACTINIC KERATOSIS 10/23/2007   OSTEOARTHRITIS, KNEES, BILATERAL 10/23/2007   OSTEOPENIA 08/10/2008   UNSPECIFIED PROBLEMS W/LIMBS AND OTHER PROBLEMS 02/26/2008   Persistent atrial fibrillation ([place]) 06/14/2012   Long term (current) use of anticoagulants 06/14/2012   Mild nonproliferative diabetic retinopathy(362.04) 12/05/2012   Pacemaker 01/08/2013   Insomnia 03/13/2013   Dry skin dermatitis 03/13/2013   Chronic pain syndrome 05/22/2013   Allergic rhinitis 08/06/2013   Constipation 02/10/2014   Typical atrial flutter ([place]) 05/01/2014   S/P AVR (aortic valve replacement) 05/17/2014   Encounter for chronic pain management 06/02/2014   Recurrent falls 06/02/2014   Presbycusis of both ears 03/10/2015   Malignant neoplasm skin of face 03/10/2015   SSS (sick sinus syndrome) ([place]) 03/22/2015   CKD stage 3 due to type 2 diabetes mellitus ([place]) 06/15/2015   Hearing loss 09/02/2015   Restless leg syndrome 09/02/2015   Hypoxia 12/14/2015   Acute congestive heart failure (HCC)    Atrial fibrillation with RVR ([place])  Pleural effusion, bilateral    History of aortic valve replacement with bioprosthetic valve 07/23/2016   Pacemaker battery depletion 10/11/2020   Acute CHF (congestive heart failure) ([place]) 04/10/2021   Acute respiratory failure with hypoxia ([place]) 04/11/2021   Acute kidney injury superimposed on chronic kidney disease ([place]) 04/11/2021   Iron deficiency anemia  04/11/2021   Aortic stenosis 04/14/2021   Resolved Ambulatory Problems    Diagnosis Date Noted   RESTLESS LEG SYNDROME 11/22/2007   GAIT IMBALANCE 48/27/0786   [facility] COMPLICATION DUE HEART VALVE PROSTHESIS 11/22/2007   Seasonal allergies 06/05/2010   Dysuria 02/12/2011   Major depression 07/25/2011   Congestion of upper airway 03/10/2012   Pacemaker 01/08/2013   Sinusitis, acute, maxillary 04/02/2014   UTI (urinary tract infection) 09/02/2014   Past Medical History:  Diagnosis Date   Anxiety    CHF (congestive heart failure) (HCC)    DM2 (diabetes mellitus, type 2) (HCC)    GERD (gastroesophageal reflux disease)    HLD (hyperlipidemia)    HTN (hypertension)    PAF (paroxysmal atrial fibrillation) (HCC)    Tobacco abuse    SOCIAL HX:  Social History   Tobacco Use   Smoking status: Never   Smokeless tobacco: Current    Types: Snuff  Substance Use Topics   Alcohol use: No    Alcohol/week: 0.0 standard drinks of alcohol     ALLERGIES:  Allergies  Allergen Reactions   Escitalopram     Insomnia / paranoid    Pravachol [Pravastatin Sodium] Other (See Comments)    myalgias   Tape Other (See Comments)    Patient states that she has skin cancer and that it is thin (PLEASE USE COBAN WRAP INSTEAD OF TAPE!)     PERTINENT MEDICATIONS:  Outpatient Encounter Medications as of 09/15/2021  Medication Sig   ALPRAZolam (XANAX) 1 MG tablet Take 0.5 tablets (0.5 mg total) by mouth 2 (two) times daily as needed for anxiety. (Patient taking differently: Take 1 mg by mouth at bedtime as needed for anxiety.)   apixaban (ELIQUIS) 2.5 MG TABS tablet TAKE 1 TABLET (2.5 MG TOTAL) BY MOUTH 2 (TWO) TIMES DAILY. (Patient taking differently: Take 2.5 mg by mouth 2 (two) times daily.)   Blood Glucose Monitoring Suppl (ONE TOUCH ULTRA 2) W/DEVICE KIT    Cyanocobalamin (VITAMIN DEFICIENCY SYSTEM-B12 IJ) Inject 1 Dose as directed every 30 (thirty) days. Not sure about dosage   Evolocumab (REPATHA  SURECLICK) 754 MG/ML SOAJ INJECT 140 MG INTO THE SKIN EVERY 14 (FOURTEEN) DAYS.   ezetimibe (ZETIA) 10 MG tablet TAKE 1 TABLET (10 MG TOTAL) BY MOUTH DAILY.   ferrous sulfate 325 (65 FE) MG tablet Take 1 tablet (325 mg total) by mouth daily with breakfast.   fluticasone (FLONASE) 50 MCG/ACT nasal spray Place 1 spray into both nostrils daily as needed for allergies. (Patient not taking: Reported on 05/02/2021)   furosemide (LASIX) 20 MG tablet Take 1 tablet (20 mg total) by mouth daily. Take 40 mg for a weight over 140 lbs.   linaclotide (LINZESS) 145 MCG CAPS capsule Take 145 mcg by mouth daily as needed (constipation).   metFORMIN (GLUCOPHAGE) 500 MG tablet TAKE 1 TABLET (500 MG TOTAL) BY MOUTH 2 (TWO) TIMES DAILY WITH A MEAL.   metoprolol tartrate (LOPRESSOR) 100 MG tablet Take 1 tablet (100 mg total) by mouth 2 (two) times daily.   omeprazole (PRILOSEC) 20 MG capsule TAKE 1 CAPSULE BY MOUTH EVERY DAY (Patient taking differently: 20 mg daily.)  ONE TOUCH ULTRA TEST test strip TEST BLOOD SUGAR TWICE A DAY (Patient not taking: Reported on 05/02/2021)   potassium chloride SA (KLOR-CON M) 20 MEQ tablet Take 2 tablets (40 mEq total) by mouth daily.   No facility-administered encounter medications on file as of 09/15/2021.     I spent 40 minutes providing this consultation; this includes time spent with patient/family, chart review and documentation. More than 50% of the time in this consultation was spent on counseling and coordinating communication.   Thank you for the opportunity to participate in the care of Eben Burow Please call our office at [phone] if we can be of additional assistance.  Note: Portions of this note were generated with [employer]. Dictation errors may occur despite best attempts at proofreading.  Teodoro Spray, NP

## 2021-09-19 DIAGNOSIS — Z9181 History of falling: Secondary | ICD-10-CM | POA: Diagnosis not present

## 2021-09-19 DIAGNOSIS — I130 Hypertensive heart and chronic kidney disease with heart failure and stage 1 through stage 4 chronic kidney disease, or unspecified chronic kidney disease: Secondary | ICD-10-CM | POA: Diagnosis not present

## 2021-09-19 DIAGNOSIS — M6281 Muscle weakness (generalized): Secondary | ICD-10-CM | POA: Diagnosis not present

## 2021-09-19 DIAGNOSIS — I509 Heart failure, unspecified: Secondary | ICD-10-CM | POA: Diagnosis not present

## 2021-09-19 DIAGNOSIS — E1122 Type 2 diabetes mellitus with diabetic chronic kidney disease: Secondary | ICD-10-CM | POA: Diagnosis not present

## 2021-09-21 ENCOUNTER — Telehealth: Payer: Self-pay | Admitting: Cardiovascular Disease

## 2021-09-21 NOTE — Telephone Encounter (Signed)
I spoke with [name] and let her know that we did get the transmission.

## 2021-09-21 NOTE — Telephone Encounter (Signed)
  1. Has your device fired? no  2. Is you device beeping? no  3. Are you experiencing draining or swelling at device site? no  4. Are you calling to see if we received your device transmission? Patient's daughter call to check if transmission was received.   5. Have you passed out? no    Please route to [place]

## 2021-10-12 ENCOUNTER — Ambulatory Visit (INDEPENDENT_AMBULATORY_CARE_PROVIDER_SITE_OTHER): Payer: Medicare Other

## 2021-10-12 DIAGNOSIS — Z6822 Body mass index (BMI) 22.0-22.9, adult: Secondary | ICD-10-CM | POA: Diagnosis not present

## 2021-10-12 DIAGNOSIS — E118 Type 2 diabetes mellitus with unspecified complications: Secondary | ICD-10-CM | POA: Diagnosis not present

## 2021-10-12 DIAGNOSIS — I495 Sick sinus syndrome: Secondary | ICD-10-CM

## 2021-10-12 DIAGNOSIS — D631 Anemia in chronic kidney disease: Secondary | ICD-10-CM | POA: Diagnosis not present

## 2021-10-12 DIAGNOSIS — R9431 Abnormal electrocardiogram [ECG] [EKG]: Secondary | ICD-10-CM | POA: Diagnosis not present

## 2021-10-12 DIAGNOSIS — D513 Other dietary vitamin B12 deficiency anemia: Secondary | ICD-10-CM | POA: Diagnosis not present

## 2021-10-12 DIAGNOSIS — N390 Urinary tract infection, site not specified: Secondary | ICD-10-CM | POA: Diagnosis not present

## 2021-10-12 LAB — CUP PACEART REMOTE DEVICE CHECK
Battery Remaining Longevity: 66 mo
Battery Remaining Percentage: 88 %
Battery Voltage: 2.98 V
Brady Statistic AP VP Percent: 50 %
Brady Statistic AP VS Percent: 43 %
Brady Statistic AS VP Percent: 1 %
Brady Statistic AS VS Percent: 5.7 %
Brady Statistic RA Percent Paced: 61 %
Brady Statistic RV Percent Paced: 56 %
Date Time Interrogation Session: 20230816040015
Lead Channel Impedance Value: 350 Ohm
Lead Channel Impedance Value: 390 Ohm
Lead Channel Pacing Threshold Amplitude: 1.625 V
Lead Channel Pacing Threshold Amplitude: 1.75 V
Lead Channel Pacing Threshold Pulse Width: 0.4 ms
Lead Channel Pacing Threshold Pulse Width: 1 ms
Lead Channel Sensing Intrinsic Amplitude: 1.5 mV
Lead Channel Sensing Intrinsic Amplitude: 6.5 mV
Lead Channel Setting Pacing Amplitude: 2 V
Lead Channel Setting Pacing Amplitude: 2.5 V
Lead Channel Setting Pacing Pulse Width: 1 ms
Lead Channel Setting Sensing Sensitivity: 2 mV
Pulse Gen Model: 2272
Pulse Gen Serial Number: 3943401

## 2021-10-16 DIAGNOSIS — N189 Chronic kidney disease, unspecified: Secondary | ICD-10-CM | POA: Diagnosis not present

## 2021-10-16 DIAGNOSIS — J9601 Acute respiratory failure with hypoxia: Secondary | ICD-10-CM | POA: Diagnosis not present

## 2021-10-16 DIAGNOSIS — N179 Acute kidney failure, unspecified: Secondary | ICD-10-CM | POA: Diagnosis not present

## 2021-10-18 ENCOUNTER — Other Ambulatory Visit: Payer: Self-pay | Admitting: Cardiovascular Disease

## 2021-10-18 DIAGNOSIS — I480 Paroxysmal atrial fibrillation: Secondary | ICD-10-CM

## 2021-10-18 DIAGNOSIS — I4819 Other persistent atrial fibrillation: Secondary | ICD-10-CM

## 2021-10-18 NOTE — Telephone Encounter (Signed)
Eliquis 2.'5mg'$  refill request received. Patient is 88 years old, weight-63.2kg, Crea-2.56 on 04/15/2021, Diagnosis-Afib, and last seen by Dr. Sallyanne Kuster on 05/02/2021. Dose is appropriate based on dosing criteria. Will send in refill to requested pharmacy.

## 2021-10-20 DIAGNOSIS — Z9181 History of falling: Secondary | ICD-10-CM | POA: Diagnosis not present

## 2021-10-20 DIAGNOSIS — I130 Hypertensive heart and chronic kidney disease with heart failure and stage 1 through stage 4 chronic kidney disease, or unspecified chronic kidney disease: Secondary | ICD-10-CM | POA: Diagnosis not present

## 2021-10-20 DIAGNOSIS — M6281 Muscle weakness (generalized): Secondary | ICD-10-CM | POA: Diagnosis not present

## 2021-10-20 DIAGNOSIS — I509 Heart failure, unspecified: Secondary | ICD-10-CM | POA: Diagnosis not present

## 2021-10-20 DIAGNOSIS — E1122 Type 2 diabetes mellitus with diabetic chronic kidney disease: Secondary | ICD-10-CM | POA: Diagnosis not present

## 2021-10-25 ENCOUNTER — Ambulatory Visit: Payer: Medicare Other | Admitting: Podiatrist

## 2021-11-10 NOTE — Progress Notes (Signed)
Remote pacemaker transmission.   

## 2021-11-16 DIAGNOSIS — J9601 Acute respiratory failure with hypoxia: Secondary | ICD-10-CM | POA: Diagnosis not present

## 2021-11-16 DIAGNOSIS — N179 Acute kidney failure, unspecified: Secondary | ICD-10-CM | POA: Diagnosis not present

## 2021-11-16 DIAGNOSIS — N189 Chronic kidney disease, unspecified: Secondary | ICD-10-CM | POA: Diagnosis not present

## 2021-11-20 DIAGNOSIS — I130 Hypertensive heart and chronic kidney disease with heart failure and stage 1 through stage 4 chronic kidney disease, or unspecified chronic kidney disease: Secondary | ICD-10-CM | POA: Diagnosis not present

## 2021-11-20 DIAGNOSIS — I509 Heart failure, unspecified: Secondary | ICD-10-CM | POA: Diagnosis not present

## 2021-11-20 DIAGNOSIS — M6281 Muscle weakness (generalized): Secondary | ICD-10-CM | POA: Diagnosis not present

## 2021-11-20 DIAGNOSIS — E1122 Type 2 diabetes mellitus with diabetic chronic kidney disease: Secondary | ICD-10-CM | POA: Diagnosis not present

## 2021-11-20 DIAGNOSIS — Z9181 History of falling: Secondary | ICD-10-CM | POA: Diagnosis not present

## 2021-11-24 ENCOUNTER — Other Ambulatory Visit: Payer: Self-pay | Admitting: Cardiovascular Disease

## 2021-11-24 DIAGNOSIS — I2510 Atherosclerotic heart disease of native coronary artery without angina pectoris: Secondary | ICD-10-CM

## 2021-11-24 DIAGNOSIS — E7801 Familial hypercholesterolemia: Secondary | ICD-10-CM

## 2021-11-28 ENCOUNTER — Ambulatory Visit: Payer: Medicare Other | Admitting: Cardiovascular Disease

## 2021-12-05 ENCOUNTER — Encounter: Payer: Medicare Other | Admitting: Cardiovascular Disease

## 2021-12-16 DIAGNOSIS — N189 Chronic kidney disease, unspecified: Secondary | ICD-10-CM | POA: Diagnosis not present

## 2021-12-16 DIAGNOSIS — N179 Acute kidney failure, unspecified: Secondary | ICD-10-CM | POA: Diagnosis not present

## 2021-12-16 DIAGNOSIS — J9601 Acute respiratory failure with hypoxia: Secondary | ICD-10-CM | POA: Diagnosis not present

## 2021-12-19 ENCOUNTER — Ambulatory Visit: Payer: Medicare Other | Attending: Cardiovascular Disease | Admitting: Cardiovascular Disease

## 2021-12-19 ENCOUNTER — Encounter: Payer: Self-pay | Admitting: Cardiovascular Disease

## 2021-12-19 VITALS — BP 112/62 | HR 68 | Ht 64.0 in | Wt 134.6 lb

## 2021-12-19 DIAGNOSIS — N184 Chronic kidney disease, stage 4 (severe): Secondary | ICD-10-CM

## 2021-12-19 DIAGNOSIS — Z953 Presence of xenogenic heart valve: Secondary | ICD-10-CM

## 2021-12-19 DIAGNOSIS — Z950 Presence of cardiac pacemaker: Secondary | ICD-10-CM

## 2021-12-19 DIAGNOSIS — E1122 Type 2 diabetes mellitus with diabetic chronic kidney disease: Secondary | ICD-10-CM

## 2021-12-19 DIAGNOSIS — I350 Nonrheumatic aortic (valve) stenosis: Secondary | ICD-10-CM | POA: Diagnosis not present

## 2021-12-19 DIAGNOSIS — I5032 Chronic diastolic (congestive) heart failure: Secondary | ICD-10-CM

## 2021-12-19 DIAGNOSIS — I495 Sick sinus syndrome: Secondary | ICD-10-CM | POA: Diagnosis not present

## 2021-12-19 DIAGNOSIS — I2510 Atherosclerotic heart disease of native coronary artery without angina pectoris: Secondary | ICD-10-CM | POA: Diagnosis not present

## 2021-12-19 DIAGNOSIS — I4819 Other persistent atrial fibrillation: Secondary | ICD-10-CM

## 2021-12-19 DIAGNOSIS — N183 Chronic kidney disease, stage 3 unspecified: Secondary | ICD-10-CM | POA: Diagnosis not present

## 2021-12-19 DIAGNOSIS — E7801 Familial hypercholesterolemia: Secondary | ICD-10-CM

## 2021-12-19 NOTE — Patient Instructions (Signed)
Medication Instructions:  No changes *If you need a refill on your cardiac medications before your next appointment, please call your pharmacy*   Lab Work: None ordered If you have labs (blood work) drawn today and your tests are completely normal, you will receive your results only by: [place] (if you have MyChart) OR A paper copy in the mail If you have any lab test that is abnormal or we need to change your treatment, we will call you to review the results.   Testing/Procedures: None ordered   Follow-Up: At [facility], you and your health needs are our priority.  As part of our continuing mission to provide you with exceptional heart care, we have created designated Provider Care Teams.  These Care Teams include your primary Cardiologist (physician) and Advanced Practice Providers (APPs -  Physician Assistants and Nurse Practitioners) who all work together to provide you with the care you need, when you need it.  We recommend signing up for the patient portal called "MyChart".  Sign up information is provided on this After Visit Summary.  MyChart is used to connect with patients for Virtual Visits (Telemedicine).  Patients are able to view lab/test results, encounter notes, upcoming appointments, etc.  Non-urgent messages can be sent to your provider as well.   To learn more about what you can do with MyChart, go to [email or web address].    Your next appointment:   6 month(s)  The format for your next appointment:   In Person  Provider:   Sanda Klein, MD

## 2021-12-19 NOTE — Progress Notes (Signed)
Patient ID: [name], female   DOB: 15-Jun-1929, 87 y.o.   MRN: [id number]     Cardiology Office Note    Date:  12/20/2021   ID:  [name], DOB January 03, 1930, MRN [id number]  PCP:  Rhea Bleacher, NP  Cardiologist:   Sanda Klein, MD   Chief Complaint  Patient presents with   Cardiac Valve Problem   Atrial Fibrillation         History of Present Illness:  [name] is a 88 y.o. female with a history of paroxysmal atrial fibrillation on chronic anticoagulation, sinus node dysfunction s/p dual chamber pacemaker (leads 2005, generator change 2014 and August 2022, [employer]), valvular heart disease with a biological aortic valve prosthesis (21 mm, 2005, Dr. Servando Snare) which has subsequently developed severe stenosis, severe hypercholesterolemia. She has not had clinical coronary disease, but preoperative cardiac catheterization in 2005 showed 40% LAD stenosis.   She was hospitalized in February 2023 with acute heart failure exacerbation and was found to have severe aortic stenosis.  There was a surprising increase in the transaortic gradients compared with only 6 months earlier (mean gradient 29 mmHg, effective orifice area 0.5 cm, dimensionless valve index 0.2).  It is possible that the gradients are underestimated on the echo from July 2022.  We discussed options for care including possible TAVR, but she firmly decided on DNR status and has been receiving palliative care.  After being in persistent atrial fibrillation for roughly 3 months (January - March 2023) she converted to normal rhythm and has done remarkably better from a clinical point of view.  She does not have orthopnea or PND.  She remains quite sedentary, but can still move around the house without difficulty.  She denies angina or dyspnea with exertion.  She has not had any lower extremity edema.  She has not had syncope.  She is no longer needing oxygen.  Her weight at home is down by roughly 3-4 pounds  compared with her last office visit.  Her home scale shows 3-5 pounds less than our office scale.  Pacemaker interrogation shows a burden of atrial fibrillation of roughly 26%, but this is entirely explained by the 39-monthepisode earlier this year.  Otherwise she has 68% atrial pacing and 55% ventricular pacing.  There has been only 1 episode of high ventricular rate.  She continues to have very brief episodes of atrial fibrillation lasting for minutes to hours sporadically.  19 rhythm today is mostly atrial sensed-ventricular paced with some atrial paced-ventricular paced beats.  Her right ventricular pacing threshold is chronically mildly elevated but stable.  She has a distinct positive R wave in lead V1.  As she is lost even more weight her pacemaker has migrated caudally towards her breast and now the loops of redundant pacemaker leads stick out prominently under her thin skin.  She has not been able to tolerate statins after trying multiple different agents.  She also was intolerant of Zetia.  On treatment with Repatha, she has had some improvement in her LDL cholesterol, but less than one would expect.  She probably has heterozygous familial hypercholesterolemia.   Past Medical History:  Diagnosis Date   Anemia    Anxiety    CAD (coronary artery disease)    CHF (congestive heart failure) (HCC)    EF 55% on 01/05/11, trace AR w/ Bioprosthetic valve   DM2 (diabetes mellitus, type 2) (HCC)    GERD (gastroesophageal reflux disease)    HLD (hyperlipidemia)  HTN (hypertension)    PAF (paroxysmal atrial fibrillation) (HCC)    Restless legs syndrome (RLS)    Tobacco abuse     Past Surgical History:  Procedure Laterality Date   AORTIC VALVE REPLACEMENT  08/25/2002   tissue valve   CARDIOVERSION N/A 05/05/2014   Procedure: CARDIOVERSION;  Surgeon: Dorothy Spark, MD;  Location: [facility];  Service: Cardiovascular;  Laterality: N/A;   PACEMAKER INSERTION  04/02/2012   [employer]    PERMANENT PACEMAKER GENERATOR CHANGE N/A 04/02/2012   Procedure: PERMANENT PACEMAKER GENERATOR CHANGE;  Surgeon: Sanda Klein, MD;  Location: [facility];  Service: Cardiovascular;  Laterality: N/A;   PPM GENERATOR CHANGEOUT N/A 10/11/2020   Procedure: PPM GENERATOR CHANGEOUT;  Surgeon: Sanda Klein, MD;  Location: [facility];  Service: Cardiovascular;  Laterality: N/A;    Outpatient Medications Prior to Visit  Medication Sig Dispense Refill   ALPRAZolam (XANAX) 1 MG tablet Take 0.5 tablets (0.5 mg total) by mouth 2 (two) times daily as needed for anxiety. (Patient taking differently: Take 1 mg by mouth at bedtime as needed for anxiety.) 60 tablet 0   apixaban (ELIQUIS) 2.5 MG TABS tablet TAKE 1 TABLET BY MOUTH TWICE A DAY 60 tablet 6   Blood Glucose Monitoring Suppl (ONE TOUCH ULTRA 2) W/DEVICE KIT   0   Evolocumab (REPATHA SURECLICK) 993 MG/ML SOAJ INJECT 140 MG INTO THE SKIN EVERY 14 (FOURTEEN) DAYS. 6 mL 1   ferrous sulfate 325 (65 FE) MG tablet Take 1 tablet (325 mg total) by mouth daily with breakfast.     fluticasone (FLONASE) 50 MCG/ACT nasal spray Place 1 spray into both nostrils daily as needed for allergies.     furosemide (LASIX) 20 MG tablet Take 1 tablet (20 mg total) by mouth daily. Take 40 mg for a weight over 140 lbs. 35 tablet 6   linaclotide (LINZESS) 145 MCG CAPS capsule Take 145 mcg by mouth daily as needed (constipation).     metFORMIN (GLUCOPHAGE) 500 MG tablet TAKE 1 TABLET (500 MG TOTAL) BY MOUTH 2 (TWO) TIMES DAILY WITH A MEAL. 180 tablet 3   metoprolol tartrate (LOPRESSOR) 100 MG tablet Take 1 tablet (100 mg total) by mouth 2 (two) times daily. 60 tablet 5   omeprazole (PRILOSEC) 40 MG capsule Take 40 mg by mouth daily.     Cyanocobalamin (VITAMIN DEFICIENCY SYSTEM-B12 IJ) Inject 1 Dose as directed every 30 (thirty) days. Not sure about dosage (Patient not taking: Reported on 12/19/2021)     ezetimibe (ZETIA) 10 MG tablet TAKE 1 TABLET (10 MG TOTAL) BY MOUTH  DAILY. (Patient not taking: Reported on 12/19/2021) 90 tablet 0   omeprazole (PRILOSEC) 20 MG capsule TAKE 1 CAPSULE BY MOUTH EVERY DAY (Patient not taking: Reported on 12/19/2021) 90 capsule 0   ONE TOUCH ULTRA TEST test strip TEST BLOOD SUGAR TWICE A DAY (Patient not taking: Reported on 12/19/2021) 50 each 4   potassium chloride SA (KLOR-CON M) 20 MEQ tablet Take 2 tablets (40 mEq total) by mouth daily. (Patient not taking: Reported on 12/19/2021) 60 tablet 0   No facility-administered medications prior to visit.     Allergies:   Escitalopram, Pravachol [pravastatin sodium], and Tape   Social History   Socioeconomic History   Marital status: Widowed    Spouse name: Not on file   Number of children: 5   Years of education: 5   Highest education level: Not on file  Occupational History   Occupation: Retired-Farmer  Employer: RETIRED  Tobacco Use   Smoking status: Never   Smokeless tobacco: Current    Types: Snuff  Substance and Sexual Activity   Alcohol use: No    Alcohol/week: 0.0 standard drinks of alcohol   Drug use: No   Sexual activity: Not on file  Other Topics Concern   Not on file  Social History Narrative   Health Care POA:    Emergency Contact: daughter, [name] [phone]   End of Life Plan: gave pt ad pamphlet   Who lives with you: daughter and 2 grandchildren   Any pets: 2 dogs   Diet: pt has a varied diet of protein, starch and vegetables.   Exercise: pt garden's daily   Seatbelts: pt does not wear seatbelt because of pace maker   Sun Exposure/Protection: pt does not use sun protection, has irregular dermatology appointments.   Hobbies: gardening, sitting on porch.         Social Determinants of Health   Financial Resource Strain: Not on file  Food Insecurity: Not on file  Transportation Needs: Not on file  Physical Activity: Not on file  Stress: Not on file  Social Connections: Not on file     Family History:  The patient's family history  includes Heart attack in her brother; Kidney disease in her sister.   ROS:   Please see the history of present illness.    ROS All other systems are reviewed and are negative.  PHYSICAL EXAM:   VS:  BP 112/62 (BP Location: Left Arm, Patient Position: Sitting, Cuff Size: Normal)   Pulse 68   Ht _0  (1.626 m)   Wt 61.1 kg   SpO2 94%   BMI 23.10 kg/m      General: Alert, oriented x3, no distress, prominent right subclavian pacemaker, but the skin slides easily over the device, without localized redness, swelling or tenderness. Head: Status post nasal surgery, PERRL, EOMI, no exophtalmos or lid lag, no myxedema, no xanthelasma; normal ears, nose and oropharynx Neck: normal jugular venous pulsations and no hepatojugular reflux; brisk carotid pulses without delay and bilateral carotid bruits Chest: clear to auscultation, no signs of consolidation by percussion or palpation, normal fremitus, symmetrical and full respiratory excursions Cardiovascular: normal position and quality of the apical impulse, regular rhythm, normal first and paradoxically split second heart sounds (still heard distinctly), 3/6 mid peaking systolic ejection murmur heard broadly throughout the chest but loudest at the right upper sternal border radiating towards the carotids.  No systolic murmurs, rubs or gallops Abdomen: no tenderness or distention, no masses by palpation, no abnormal pulsatility or arterial bruits, normal bowel sounds, no hepatosplenomegaly Extremities: no clubbing, cyanosis or edema; 2+ radial, ulnar and brachial pulses bilaterally; 2+ right femoral, posterior tibial and dorsalis pedis pulses; 2+ left femoral, posterior tibial and dorsalis pedis pulses; no subclavian or femoral bruits Neurological: grossly nonfocal Psych: Normal mood and affect     Wt Readings from Last 3 Encounters:  12/19/21 61.1 kg  06/07/21 63.2 kg  05/02/21 62.9 kg      Studies/Labs Reviewed:  Echocardiogram 04/11/2021     1. Left ventricular ejection fraction, by estimation, is 60 to 65%. The left ventricle has normal function. The left ventricle has no regional wall motion abnormalities. There is moderate left ventricular hypertrophy.  Left ventricular diastolic parameters are indeterminate.   2. Right ventricular systolic function was not well visualized. The right ventricular size is not well visualized. There is normal pulmonary artery systolic pressure. The  estimated right ventricular systolic pressure is 21.1 mmHg.   3. The mitral valve is degenerative. Trivial mitral valve regurgitation. No evidence of mitral stenosis.   4. There is a bioprosthetic valve present in the aortic position.     Aortic valve regurgitation is trivial. Vmax 3.4 m/s, MG 29 mmHg, EOA 0.5 cm^2, DI 0.2. Findings suggest significant prosthetic valve stenosis, gradient has significantly increased from prior echo 08/2020 (15 > 29 mmHg)   5. The inferior vena cava is normal in size with greater than 50% respiratory variability, suggesting right atrial pressure of 3 mmHg.   6. Left atrial size was mildly dilated.   EKG: ECG is ordered today, shows mostly atrial sensed, ventricular paced beats, occasional atrial paced-ventricular paced beats, distinct positive R wave in lead V1, QTc 555 ms.  Recent Labs: Labs from 11/29/2018 show normal liver function tests and normal TSH, hemoglobin 11.5, creatinine 1.46  Labs from 01/08/2020 from PCP are significant for potassium 4.1, creatinine 1.40, normal liver function tests, hemoglobin A1c 5.5%, hemoglobin 11.6 Total cholesterol 284, LDL 180, triglycerides 124, HDL 55  BMET    Component Value Date/Time   NA 138 04/15/2021 0404   NA 143 10/07/2020 1200   K 4.8 04/15/2021 0404   CL 100 04/15/2021 0404   CO2 26 04/15/2021 0404   GLUCOSE 104 (H) 04/15/2021 0404   BUN 31 (H) 04/15/2021 0404   BUN 24 10/07/2020 1200   CREATININE 2.56 (H) 04/15/2021 0404   CREATININE 1.27 (H) 09/02/2015 1124    CALCIUM 8.6 (L) 04/15/2021 0404   GFRNONAA 17 (L) 04/15/2021 0404   GFRNONAA 39 (L) 09/02/2015 1124   GFRAA 34 (L) 12/16/2015 0916   GFRAA 45 (L) 09/02/2015 1124   Lipid Panel    Component Value Date/Time   CHOL 225 (H) 05/18/2020 1537   TRIG 171 (H) 05/18/2020 1537   HDL 61 05/18/2020 1537   CHOLHDL 3.7 05/18/2020 1537   CHOLHDL 6.8 Ratio 04/06/2009 2033   VLDL 41 (H) 04/06/2009 2033   LDLCALC 134 (H) 05/18/2020 1537   LDLDIRECT 141 (H) 06/14/2015 1507   Prior to initiating Repatha on 01/08/2020 total cholesterol 284, LDL 180, HDL 55, triglycerides 224  05/18/2020 Cholesterol 225, HDL 61, LDL 134, triglycerides 171  ASSESSMENT:    1. Chronic diastolic heart failure ([place])   2. Nonrheumatic aortic valve stenosis   3. History of aortic valve replacement with bioprosthetic valve   4. Persistent atrial fibrillation ([place])   5. SSS (sick sinus syndrome) (HCC)   6. Pacemaker   7. Coronary artery disease involving native coronary artery of native heart without angina pectoris   8. Heterozygous familial hypercholesterolemia   9. Controlled type 2 diabetes mellitus with stage 3 chronic kidney disease, without long-term current use of insulin ([place])   10. CKD (chronic kidney disease) stage 4, GFR 15-29 ml/min (HCC)       PLAN:  In order of problems listed above:  CHF: Clinically euvolemic.  Difficult balance with renal dysfunction, but she is not dyspneic and does not have overt edema.  Reinforced the importance of daily weight monitoring.  Sodium restricted diet reviewed.  It is quite remarkable that she has not had new episodes of heart failure exacerbation and this may be due to the fact that she has not had atrial fibrillation lasting more than a few hours in the last 9 months. Prosthetic aortic valve stenosis: She reiterates that she is not interested in undergoing TAVR or cardiac catheterization in general.  Still has a distinct second heart sound on physical exam suggesting that  maybe we are overestimating the severity of the aortic stenosis.  Since her echocardiogram was performed in atrial fibrillation, the challenge in accurately measuring left ventricular outflow tract and aortic valve VTI's may contribute to some exaggeration of the severity of aortic stenosis. Atrial flutter/atrial fibrillation: Remarkable reduction in the burden of arrhythmia in the last 9 months.  She has done this before: After having a year of uninterrupted persistent atrial fibrillation return to normal rhythm for a protracted period of time.  Appropriate anticoagulated.  CHADSVasc score 6 (age 86, gender, HBP, vascular disease, DM).  Antiarrhythmics are not justified in use of amiodarone (probably the only safe drug to use in her case) may paradoxically make the situation worse by increasing the burden of ventricular pacing. SSS: Rate histogram distribution appropriate for her sedentary lifestyle. Pacemaker.  Estimated generator longevity 5.5 years (less than anticipated due to the increased RV pacing thresholds), but probably longer than her anticipated prognosis with her other medical problems. CAD: Has never had angina pectoris.  Minor coronary artery disease by cardiac catheterization in 2005.  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  Started Repatha and LDL decreased from 180 to 134. intolerant to statins and ezetimibe.  Only partial response to Repatha. DM: Well-controlled.  Hemoglobin A1c was 5.9% in February. HTN: Well-controlled.  Note known history of orthostatic hypotension.   Chronic anticoagulation: Dose adjusted for age and low body size and renal dysfunction. CKD 4: For the most part GFR has been 25-30, deteriorated after her treatment with diuretics for heart failure, but optimal control of symptoms is a priority over monitoring of her renal dysfunction at this point. Goals of care: Patient states very clearly and  without emotion that she is satisfied with the length of her life and that she is ready to pass along when the time comes.  She does not want any aggressive or invasive medical therapies.  Palliative care evaluation is scheduled.    Medication Adjustments/Labs and Tests Ordered: Current medicines are reviewed at length with the patient today.  Concerns regarding medicines are outlined above.  Medication changes, Labs and Tests ordered today are listed in the Patient Instructions below. Patient Instructions  Medication Instructions:  No changes *If you need a refill on your cardiac medications before your next appointment, please call your pharmacy*   Lab Work: None ordered If you have labs (blood work) drawn today and your tests are completely normal, you will receive your results only by: [place] (if you have MyChart) OR A paper copy in the mail If you have any lab test that is abnormal or we need to change your treatment, we will call you to review the results.   Testing/Procedures: None ordered   Follow-Up: At [facility], you and your health needs are our priority.  As part of our continuing mission to provide you with exceptional heart care, we have created designated Provider Care Teams.  These Care Teams include your primary Cardiologist (physician) and Advanced Practice Providers (APPs -  Physician Assistants and Nurse Practitioners) who all work together to provide you with the care you need, when you need it.  We recommend signing up for the patient portal called "MyChart".  Sign up information is provided on this After Visit Summary.  MyChart is used to connect with patients for Virtual Visits (Telemedicine).  Patients are able to view lab/test results, encounter  notes, upcoming appointments, etc.  Non-urgent messages can be sent to your provider as well.   To learn more about what you can do with MyChart, go to [email or web address].    Your next  appointment:   6 month(s)  The format for your next appointment:   In Person  Provider:   Sanda Klein, MD           Signed, Sanda Klein, MD  12/20/2021 3:40 PM    [facility] [place], [place],   [address] Phone: [phone]; Fax: [phone]

## 2021-12-20 ENCOUNTER — Encounter: Payer: Self-pay | Admitting: Cardiovascular Disease

## 2021-12-20 DIAGNOSIS — M6281 Muscle weakness (generalized): Secondary | ICD-10-CM | POA: Diagnosis not present

## 2021-12-20 DIAGNOSIS — E1122 Type 2 diabetes mellitus with diabetic chronic kidney disease: Secondary | ICD-10-CM | POA: Diagnosis not present

## 2021-12-20 DIAGNOSIS — I130 Hypertensive heart and chronic kidney disease with heart failure and stage 1 through stage 4 chronic kidney disease, or unspecified chronic kidney disease: Secondary | ICD-10-CM | POA: Diagnosis not present

## 2021-12-20 DIAGNOSIS — I509 Heart failure, unspecified: Secondary | ICD-10-CM | POA: Diagnosis not present

## 2021-12-20 DIAGNOSIS — Z9181 History of falling: Secondary | ICD-10-CM | POA: Diagnosis not present

## 2022-01-09 DIAGNOSIS — I10 Essential (primary) hypertension: Secondary | ICD-10-CM | POA: Diagnosis not present

## 2022-01-11 ENCOUNTER — Ambulatory Visit (INDEPENDENT_AMBULATORY_CARE_PROVIDER_SITE_OTHER): Payer: Medicare Other

## 2022-01-11 DIAGNOSIS — I495 Sick sinus syndrome: Secondary | ICD-10-CM | POA: Diagnosis not present

## 2022-01-11 DIAGNOSIS — Z950 Presence of cardiac pacemaker: Secondary | ICD-10-CM | POA: Diagnosis not present

## 2022-01-11 LAB — CUP PACEART REMOTE DEVICE CHECK
Battery Remaining Longevity: 66 mo
Battery Remaining Percentage: 84 %
Battery Voltage: 2.98 V
Brady Statistic AP VP Percent: 49 %
Brady Statistic AP VS Percent: 46 %
Brady Statistic AS VP Percent: 1 %
Brady Statistic AS VS Percent: 4.7 %
Brady Statistic RA Percent Paced: 70 %
Brady Statistic RV Percent Paced: 54 %
Date Time Interrogation Session: 20231115020015
Lead Channel Impedance Value: 350 Ohm
Lead Channel Impedance Value: 360 Ohm
Lead Channel Pacing Threshold Amplitude: 1.5 V
Lead Channel Pacing Threshold Pulse Width: 1 ms
Lead Channel Sensing Intrinsic Amplitude: 0.8 mV
Lead Channel Sensing Intrinsic Amplitude: 6.5 mV
Lead Channel Setting Pacing Amplitude: 1.75 V
Lead Channel Setting Pacing Amplitude: 2.5 V
Lead Channel Setting Pacing Pulse Width: 1 ms
Lead Channel Setting Sensing Sensitivity: 2 mV
Pulse Gen Model: 2272
Pulse Gen Serial Number: 3943401

## 2022-01-12 DIAGNOSIS — I10 Essential (primary) hypertension: Secondary | ICD-10-CM | POA: Diagnosis not present

## 2022-01-12 DIAGNOSIS — D513 Other dietary vitamin B12 deficiency anemia: Secondary | ICD-10-CM | POA: Diagnosis not present

## 2022-01-12 DIAGNOSIS — N1832 Chronic kidney disease, stage 3b: Secondary | ICD-10-CM | POA: Diagnosis not present

## 2022-01-12 DIAGNOSIS — Z23 Encounter for immunization: Secondary | ICD-10-CM | POA: Diagnosis not present

## 2022-01-12 DIAGNOSIS — I4891 Unspecified atrial fibrillation: Secondary | ICD-10-CM | POA: Diagnosis not present

## 2022-01-12 DIAGNOSIS — E611 Iron deficiency: Secondary | ICD-10-CM | POA: Diagnosis not present

## 2022-01-12 DIAGNOSIS — E118 Type 2 diabetes mellitus with unspecified complications: Secondary | ICD-10-CM | POA: Diagnosis not present

## 2022-01-12 DIAGNOSIS — Q845 Enlarged and hypertrophic nails: Secondary | ICD-10-CM | POA: Diagnosis not present

## 2022-01-12 DIAGNOSIS — I739 Peripheral vascular disease, unspecified: Secondary | ICD-10-CM | POA: Diagnosis not present

## 2022-01-12 DIAGNOSIS — Z6823 Body mass index (BMI) 23.0-23.9, adult: Secondary | ICD-10-CM | POA: Diagnosis not present

## 2022-01-12 DIAGNOSIS — E875 Hyperkalemia: Secondary | ICD-10-CM | POA: Diagnosis not present

## 2022-01-16 DIAGNOSIS — N189 Chronic kidney disease, unspecified: Secondary | ICD-10-CM | POA: Diagnosis not present

## 2022-01-16 DIAGNOSIS — J9601 Acute respiratory failure with hypoxia: Secondary | ICD-10-CM | POA: Diagnosis not present

## 2022-01-16 DIAGNOSIS — N179 Acute kidney failure, unspecified: Secondary | ICD-10-CM | POA: Diagnosis not present

## 2022-01-20 DIAGNOSIS — I130 Hypertensive heart and chronic kidney disease with heart failure and stage 1 through stage 4 chronic kidney disease, or unspecified chronic kidney disease: Secondary | ICD-10-CM | POA: Diagnosis not present

## 2022-01-20 DIAGNOSIS — M6281 Muscle weakness (generalized): Secondary | ICD-10-CM | POA: Diagnosis not present

## 2022-01-20 DIAGNOSIS — I509 Heart failure, unspecified: Secondary | ICD-10-CM | POA: Diagnosis not present

## 2022-01-20 DIAGNOSIS — Z9181 History of falling: Secondary | ICD-10-CM | POA: Diagnosis not present

## 2022-01-20 DIAGNOSIS — E1122 Type 2 diabetes mellitus with diabetic chronic kidney disease: Secondary | ICD-10-CM | POA: Diagnosis not present

## 2022-02-03 ENCOUNTER — Other Ambulatory Visit: Payer: Self-pay | Admitting: Cardiovascular Disease

## 2022-02-03 NOTE — Progress Notes (Signed)
Remote pacemaker transmission.   

## 2022-02-09 DIAGNOSIS — I10 Essential (primary) hypertension: Secondary | ICD-10-CM | POA: Diagnosis not present

## 2022-02-15 DIAGNOSIS — N179 Acute kidney failure, unspecified: Secondary | ICD-10-CM | POA: Diagnosis not present

## 2022-02-15 DIAGNOSIS — J9601 Acute respiratory failure with hypoxia: Secondary | ICD-10-CM | POA: Diagnosis not present

## 2022-02-15 DIAGNOSIS — N189 Chronic kidney disease, unspecified: Secondary | ICD-10-CM | POA: Diagnosis not present

## 2022-02-19 DIAGNOSIS — I130 Hypertensive heart and chronic kidney disease with heart failure and stage 1 through stage 4 chronic kidney disease, or unspecified chronic kidney disease: Secondary | ICD-10-CM | POA: Diagnosis not present

## 2022-02-19 DIAGNOSIS — Z9181 History of falling: Secondary | ICD-10-CM | POA: Diagnosis not present

## 2022-02-19 DIAGNOSIS — M6281 Muscle weakness (generalized): Secondary | ICD-10-CM | POA: Diagnosis not present

## 2022-02-19 DIAGNOSIS — I509 Heart failure, unspecified: Secondary | ICD-10-CM | POA: Diagnosis not present

## 2022-02-19 DIAGNOSIS — E1122 Type 2 diabetes mellitus with diabetic chronic kidney disease: Secondary | ICD-10-CM | POA: Diagnosis not present

## 2022-02-25 ENCOUNTER — Other Ambulatory Visit: Payer: Self-pay | Admitting: Cardiovascular Disease

## 2022-02-28 ENCOUNTER — Other Ambulatory Visit: Payer: Self-pay | Admitting: Cardiovascular Disease

## 2022-02-28 ENCOUNTER — Telehealth: Payer: Self-pay | Admitting: Cardiovascular Disease

## 2022-02-28 NOTE — Telephone Encounter (Signed)
*  STAT* If patient is at the pharmacy, call can be transferred to refill team.   1. Which medications need to be refilled? (please list name of each medication and dose if known)  Diltiazem 240 MG tablet, once daily by mouth  2. Which pharmacy/location (including street and city if local pharmacy) is medication to be sent to? [id number]- [place], [place] - [address]  3. Do they need a 30 day or 90 day supply?  90 day supply

## 2022-02-28 NOTE — Telephone Encounter (Signed)
Called and spoke with patients daughter (okay per DPR) who states that patient does take Metoprolol Tartrate '100mg'$  twice daily, as well as Diltiazem '240mg'$  once daily.   Patients daughter states that patient has been taking these two since the last time she was discharged from the hospital.   Advised patients daughter I will forward to Dr. Loletha Grayer for him to review- patients daughter verbalized understanding.

## 2022-02-28 NOTE — Telephone Encounter (Signed)
If that's the case, go ahead and refill please. Combo is working well for her heart rhythm and her BP.

## 2022-02-28 NOTE — Telephone Encounter (Signed)
I do not think that she is on this medication any longer. On metoprolol for AFib rate control instead. Do not refill, but please check with her daughter [name] and find out if she was still taking the diltiazem.

## 2022-03-01 MED ORDER — DILTIAZEM HCL ER COATED BEADS 240 MG PO CP24: 240.0000 mg | ORAL_CAPSULE | Freq: Every day | ORAL | 1 refills | Status: DC

## 2022-03-01 NOTE — Telephone Encounter (Signed)
Per Dr. Sallyanne Kuster- okay to reorder Diltiazem '240mg'$  once daily as patient has been taking this.   New prescription sent to patient preferred pharmacy. Patients daughter is aware that this has been sent in. Advised her to call with any issues, questions, or concerns.

## 2022-03-15 DIAGNOSIS — I10 Essential (primary) hypertension: Secondary | ICD-10-CM | POA: Diagnosis not present

## 2022-03-18 DIAGNOSIS — J9601 Acute respiratory failure with hypoxia: Secondary | ICD-10-CM | POA: Diagnosis not present

## 2022-03-18 DIAGNOSIS — N189 Chronic kidney disease, unspecified: Secondary | ICD-10-CM | POA: Diagnosis not present

## 2022-03-18 DIAGNOSIS — N179 Acute kidney failure, unspecified: Secondary | ICD-10-CM | POA: Diagnosis not present

## 2022-03-20 ENCOUNTER — Other Ambulatory Visit (HOSPITAL_COMMUNITY): Payer: Self-pay

## 2022-03-22 DIAGNOSIS — I509 Heart failure, unspecified: Secondary | ICD-10-CM | POA: Diagnosis not present

## 2022-03-22 DIAGNOSIS — Z9181 History of falling: Secondary | ICD-10-CM | POA: Diagnosis not present

## 2022-03-22 DIAGNOSIS — M6281 Muscle weakness (generalized): Secondary | ICD-10-CM | POA: Diagnosis not present

## 2022-03-22 DIAGNOSIS — E1122 Type 2 diabetes mellitus with diabetic chronic kidney disease: Secondary | ICD-10-CM | POA: Diagnosis not present

## 2022-03-22 DIAGNOSIS — I130 Hypertensive heart and chronic kidney disease with heart failure and stage 1 through stage 4 chronic kidney disease, or unspecified chronic kidney disease: Secondary | ICD-10-CM | POA: Diagnosis not present

## 2022-04-12 ENCOUNTER — Ambulatory Visit: Payer: 59

## 2022-04-12 DIAGNOSIS — I495 Sick sinus syndrome: Secondary | ICD-10-CM | POA: Diagnosis not present

## 2022-04-12 LAB — CUP PACEART REMOTE DEVICE CHECK
Battery Remaining Longevity: 65 mo
Battery Remaining Percentage: 81 %
Battery Voltage: 2.98 V
Brady Statistic AP VP Percent: 48 %
Brady Statistic AP VS Percent: 47 %
Brady Statistic AS VP Percent: 1 %
Brady Statistic AS VS Percent: 4 %
Brady Statistic RA Percent Paced: 75 %
Brady Statistic RV Percent Paced: 52 %
Date Time Interrogation Session: 20240214040014
Lead Channel Impedance Value: 340 Ohm
Lead Channel Impedance Value: 380 Ohm
Lead Channel Pacing Threshold Amplitude: 1.625 V
Lead Channel Pacing Threshold Pulse Width: 1 ms
Lead Channel Sensing Intrinsic Amplitude: 1 mV
Lead Channel Sensing Intrinsic Amplitude: 6.9 mV
Lead Channel Setting Pacing Amplitude: 1.875
Lead Channel Setting Pacing Amplitude: 2.5 V
Lead Channel Setting Pacing Pulse Width: 1 ms
Lead Channel Setting Sensing Sensitivity: 2 mV
Pulse Gen Model: 2272
Pulse Gen Serial Number: 3943401

## 2022-04-17 DIAGNOSIS — E785 Hyperlipidemia, unspecified: Secondary | ICD-10-CM | POA: Diagnosis not present

## 2022-04-17 DIAGNOSIS — E118 Type 2 diabetes mellitus with unspecified complications: Secondary | ICD-10-CM | POA: Diagnosis not present

## 2022-04-17 DIAGNOSIS — E611 Iron deficiency: Secondary | ICD-10-CM | POA: Diagnosis not present

## 2022-04-17 DIAGNOSIS — D513 Other dietary vitamin B12 deficiency anemia: Secondary | ICD-10-CM | POA: Diagnosis not present

## 2022-04-17 DIAGNOSIS — I10 Essential (primary) hypertension: Secondary | ICD-10-CM | POA: Diagnosis not present

## 2022-04-18 DIAGNOSIS — J9601 Acute respiratory failure with hypoxia: Secondary | ICD-10-CM | POA: Diagnosis not present

## 2022-04-18 DIAGNOSIS — N189 Chronic kidney disease, unspecified: Secondary | ICD-10-CM | POA: Diagnosis not present

## 2022-04-18 DIAGNOSIS — N179 Acute kidney failure, unspecified: Secondary | ICD-10-CM | POA: Diagnosis not present

## 2022-04-26 DIAGNOSIS — I10 Essential (primary) hypertension: Secondary | ICD-10-CM | POA: Diagnosis not present

## 2022-05-04 ENCOUNTER — Other Ambulatory Visit: Payer: Self-pay | Admitting: Cardiovascular Disease

## 2022-05-12 NOTE — Progress Notes (Signed)
Remote pacemaker transmission.   

## 2022-05-17 ENCOUNTER — Other Ambulatory Visit: Payer: Self-pay | Admitting: Cardiovascular Disease

## 2022-05-17 DIAGNOSIS — I480 Paroxysmal atrial fibrillation: Secondary | ICD-10-CM

## 2022-05-17 NOTE — Telephone Encounter (Addendum)
Pt last saw Dr Sallyanne Kuster 12/19/21, last labs 04/15/21 Creat 2.56, pt is overdue for labwork.  Called pt's PCP to request labs. Age 89, weight 61.1kg, based on specified criteria pt is on appropriate dosage of Eliquis 2.5mg  BID for afib.   Last labs at PCP 04/17/22 Creat 1.8, will refill rx.

## 2022-05-27 ENCOUNTER — Other Ambulatory Visit: Payer: Self-pay | Admitting: Cardiovascular Disease

## 2022-05-27 DIAGNOSIS — I2510 Atherosclerotic heart disease of native coronary artery without angina pectoris: Secondary | ICD-10-CM

## 2022-05-27 DIAGNOSIS — E7801 Familial hypercholesterolemia: Secondary | ICD-10-CM

## 2022-06-21 ENCOUNTER — Other Ambulatory Visit: Payer: Self-pay | Admitting: Cardiovascular Disease

## 2022-07-12 ENCOUNTER — Ambulatory Visit (INDEPENDENT_AMBULATORY_CARE_PROVIDER_SITE_OTHER)

## 2022-07-12 DIAGNOSIS — I495 Sick sinus syndrome: Secondary | ICD-10-CM

## 2022-07-12 LAB — CUP PACEART REMOTE DEVICE CHECK
Battery Remaining Longevity: 64 mo
Battery Remaining Percentage: 77 %
Battery Voltage: 2.98 V
Brady Statistic AP VP Percent: 46 %
Brady Statistic AP VS Percent: 49 %
Brady Statistic AS VP Percent: 1 %
Brady Statistic AS VS Percent: 3.9 %
Brady Statistic RA Percent Paced: 77 %
Brady Statistic RV Percent Paced: 50 %
Date Time Interrogation Session: 20240515040025
Lead Channel Impedance Value: 360 Ohm
Lead Channel Impedance Value: 380 Ohm
Lead Channel Pacing Threshold Amplitude: 1.375 V
Lead Channel Pacing Threshold Pulse Width: 1 ms
Lead Channel Sensing Intrinsic Amplitude: 1.5 mV
Lead Channel Sensing Intrinsic Amplitude: 9.2 mV
Lead Channel Setting Pacing Amplitude: 1.625
Lead Channel Setting Pacing Amplitude: 2.5 V
Lead Channel Setting Pacing Pulse Width: 1 ms
Lead Channel Setting Sensing Sensitivity: 2 mV
Pulse Gen Model: 2272
Pulse Gen Serial Number: 3943401

## 2022-07-18 ENCOUNTER — Other Ambulatory Visit: Payer: Self-pay | Admitting: Cardiovascular Disease

## 2022-07-18 DIAGNOSIS — I480 Paroxysmal atrial fibrillation: Secondary | ICD-10-CM

## 2022-07-18 NOTE — Telephone Encounter (Signed)
Prescription refill request for Eliquis received. Indication:afib Last office visit:10/23 Scr:2.5 Age: 89 Weight:61.1  kg  Prescription refilled

## 2022-07-27 NOTE — Progress Notes (Signed)
Remote pacemaker transmission.   

## 2022-09-28 ENCOUNTER — Telehealth: Payer: Self-pay

## 2022-09-28 NOTE — Telephone Encounter (Signed)
I let the patient daughter know that Dr. Salena Saner do wants her to be remotely monitored. It can help with the burden of Atrial fibrillation management. The patient daughter verbalized understanding.

## 2022-09-28 NOTE — Telephone Encounter (Signed)
The patient daughter called asking if she should continue remote monitoring. Hospice is bringing the patient the pt a bed in today. I told her I would as Dr. Royann Shivers and call her back.

## 2022-10-10 LAB — CUP PACEART REMOTE DEVICE CHECK
Battery Remaining Longevity: 60 mo
Battery Remaining Percentage: 74 %
Battery Voltage: 2.98 V
Brady Statistic AP VP Percent: 46 %
Brady Statistic AP VS Percent: 51 %
Brady Statistic AS VP Percent: 1 %
Brady Statistic AS VS Percent: 3.4 %
Brady Statistic RA Percent Paced: 80 %
Brady Statistic RV Percent Paced: 49 %
Date Time Interrogation Session: 20240813153010
Lead Channel Impedance Value: 330 Ohm
Lead Channel Impedance Value: 390 Ohm
Lead Channel Pacing Threshold Amplitude: 1.5 V
Lead Channel Pacing Threshold Pulse Width: 1 ms
Lead Channel Sensing Intrinsic Amplitude: 0.8 mV
Lead Channel Sensing Intrinsic Amplitude: 7.3 mV
Lead Channel Setting Pacing Amplitude: 1.75 V
Lead Channel Setting Pacing Amplitude: 2.5 V
Lead Channel Setting Pacing Pulse Width: 1 ms
Lead Channel Setting Sensing Sensitivity: 2 mV
Pulse Gen Model: 2272
Pulse Gen Serial Number: 3943401

## 2022-10-11 ENCOUNTER — Ambulatory Visit (INDEPENDENT_AMBULATORY_CARE_PROVIDER_SITE_OTHER): Payer: 59

## 2022-10-11 DIAGNOSIS — I495 Sick sinus syndrome: Secondary | ICD-10-CM | POA: Diagnosis not present

## 2022-10-25 NOTE — Progress Notes (Signed)
Remote pacemaker transmission.   

## 2022-11-25 ENCOUNTER — Other Ambulatory Visit: Payer: Self-pay | Admitting: Cardiovascular Disease

## 2022-11-25 DIAGNOSIS — I2510 Atherosclerotic heart disease of native coronary artery without angina pectoris: Secondary | ICD-10-CM

## 2022-11-25 DIAGNOSIS — E7801 Familial hypercholesterolemia: Secondary | ICD-10-CM

## 2022-12-06 ENCOUNTER — Telehealth: Payer: Self-pay | Admitting: Cardiovascular Disease

## 2022-12-06 NOTE — Telephone Encounter (Signed)
Crisland King RN - [facility] is calling, she is requesting to get a copy of the pt's recent PPM transmission result. She gave fax# [phone]

## 2022-12-06 NOTE — Telephone Encounter (Signed)
Fax sent as requested

## 2023-01-10 ENCOUNTER — Ambulatory Visit (INDEPENDENT_AMBULATORY_CARE_PROVIDER_SITE_OTHER): Payer: 59

## 2023-01-10 DIAGNOSIS — I495 Sick sinus syndrome: Secondary | ICD-10-CM | POA: Diagnosis not present

## 2023-01-10 LAB — CUP PACEART REMOTE DEVICE CHECK
Battery Remaining Longevity: 55 mo
Battery Remaining Percentage: 70 %
Battery Voltage: 2.96 V
Brady Statistic AP VP Percent: 47 %
Brady Statistic AP VS Percent: 49 %
Brady Statistic AS VP Percent: 1 %
Brady Statistic AS VS Percent: 3 %
Brady Statistic RA Percent Paced: 80 %
Brady Statistic RV Percent Paced: 50 %
Date Time Interrogation Session: 20241113051058
Lead Channel Impedance Value: 310 Ohm
Lead Channel Impedance Value: 350 Ohm
Lead Channel Pacing Threshold Amplitude: 1 V
Lead Channel Pacing Threshold Pulse Width: 1 ms
Lead Channel Sensing Intrinsic Amplitude: 1.7 mV
Lead Channel Sensing Intrinsic Amplitude: 6.9 mV
Lead Channel Setting Pacing Amplitude: 1.25 V
Lead Channel Setting Pacing Amplitude: 2.5 V
Lead Channel Setting Pacing Pulse Width: 1 ms
Lead Channel Setting Sensing Sensitivity: 2 mV
Pulse Gen Model: 2272
Pulse Gen Serial Number: 3943401

## 2023-01-31 NOTE — Progress Notes (Signed)
Remote pacemaker transmission.   

## 2023-03-14 ENCOUNTER — Other Ambulatory Visit: Payer: Self-pay | Admitting: Cardiovascular Disease

## 2023-03-15 ENCOUNTER — Other Ambulatory Visit: Payer: Self-pay | Admitting: Cardiovascular Disease

## 2023-04-11 ENCOUNTER — Ambulatory Visit (INDEPENDENT_AMBULATORY_CARE_PROVIDER_SITE_OTHER): Payer: 59

## 2023-04-11 ENCOUNTER — Other Ambulatory Visit: Payer: Self-pay | Admitting: Cardiovascular Disease

## 2023-04-11 DIAGNOSIS — I495 Sick sinus syndrome: Secondary | ICD-10-CM | POA: Diagnosis not present

## 2023-04-11 LAB — CUP PACEART REMOTE DEVICE CHECK
Battery Remaining Longevity: 50 mo
Battery Remaining Percentage: 66 %
Battery Voltage: 2.96 V
Brady Statistic AP VP Percent: 49 %
Brady Statistic AP VS Percent: 47 %
Brady Statistic AS VP Percent: 1 %
Brady Statistic AS VS Percent: 2.8 %
Brady Statistic RA Percent Paced: 77 %
Brady Statistic RV Percent Paced: 53 %
Date Time Interrogation Session: 20250212040014
Lead Channel Impedance Value: 310 Ohm
Lead Channel Impedance Value: 380 Ohm
Lead Channel Pacing Threshold Amplitude: 1.75 V
Lead Channel Pacing Threshold Pulse Width: 1 ms
Lead Channel Sensing Intrinsic Amplitude: 1.2 mV
Lead Channel Sensing Intrinsic Amplitude: 7.5 mV
Lead Channel Setting Pacing Amplitude: 2 V
Lead Channel Setting Pacing Amplitude: 2.5 V
Lead Channel Setting Pacing Pulse Width: 1 ms
Lead Channel Setting Sensing Sensitivity: 2 mV
Pulse Gen Model: 2272
Pulse Gen Serial Number: 3943401

## 2023-04-18 ENCOUNTER — Other Ambulatory Visit: Payer: Self-pay | Admitting: Cardiovascular Disease

## 2023-04-27 ENCOUNTER — Other Ambulatory Visit: Payer: Self-pay | Admitting: Cardiovascular Disease

## 2023-05-06 ENCOUNTER — Other Ambulatory Visit: Payer: Self-pay | Admitting: Cardiovascular Disease

## 2023-05-16 NOTE — Progress Notes (Signed)
 Remote pacemaker transmission.

## 2023-05-28 ENCOUNTER — Telehealth: Payer: Self-pay

## 2023-05-28 NOTE — Telephone Encounter (Signed)
 Alert received from [employer] for HVR x2, events occurred 3/30 @ 06:17 and 11:02, longest duration 2sec, HR's 154-159, EGM's c/w AFL with RVR.  AFL in progress from 3/28.  Will call patient to assess symptoms/send another transmission.

## 2023-05-28 NOTE — Telephone Encounter (Signed)
 Thank you. I would not change meds, continue monitoring.

## 2023-05-28 NOTE — Telephone Encounter (Signed)
 Spoke to patients daughter Elidia Bonenfant, states patient is in hospice and no longer going to doctor appointments. States she would like to continue monitoring pacemaker. States patient is home with daughter.   Routing to Dr. Royann Shivers.

## 2023-06-03 ENCOUNTER — Other Ambulatory Visit: Payer: Self-pay | Admitting: Cardiovascular Disease

## 2023-06-04 NOTE — Telephone Encounter (Signed)
 Dr. Erin Hearing pt. She is passed her 3rd attempt. Does Dr. Royann Shivers want to refill? Please advise

## 2023-06-11 ENCOUNTER — Telehealth: Payer: Self-pay

## 2023-06-11 NOTE — Telephone Encounter (Signed)
 Alert remote transmission:   HVR, event occurred 4/13 @ 11:00,  duration 1hr , HR 161, EGM's c/w AFL with RVR.   AFL in progress from 4/13, Eliquis per PA report.   Per EPIC pt is on hospice care

## 2023-06-11 NOTE — Telephone Encounter (Signed)
 Mrs. [name] has had these events before, every now and then.  I do not think we will plan to change treatment unless she becomes symptomatic.

## 2023-07-11 ENCOUNTER — Ambulatory Visit (INDEPENDENT_AMBULATORY_CARE_PROVIDER_SITE_OTHER): Payer: 59

## 2023-07-11 DIAGNOSIS — I495 Sick sinus syndrome: Secondary | ICD-10-CM

## 2023-07-12 ENCOUNTER — Ambulatory Visit: Payer: Self-pay | Admitting: Cardiovascular Disease

## 2023-07-12 LAB — CUP PACEART REMOTE DEVICE CHECK
Battery Remaining Longevity: 47 mo
Battery Remaining Percentage: 62 %
Battery Voltage: 2.96 V
Brady Statistic AP VP Percent: 50 %
Brady Statistic AP VS Percent: 47 %
Brady Statistic AS VP Percent: 1 %
Brady Statistic AS VS Percent: 2.6 %
Brady Statistic RA Percent Paced: 76 %
Brady Statistic RV Percent Paced: 54 %
Date Time Interrogation Session: 20250514040014
Lead Channel Impedance Value: 290 Ohm
Lead Channel Impedance Value: 380 Ohm
Lead Channel Pacing Threshold Amplitude: 1.5 V
Lead Channel Pacing Threshold Amplitude: 1.75 V
Lead Channel Pacing Threshold Pulse Width: 0.4 ms
Lead Channel Pacing Threshold Pulse Width: 1 ms
Lead Channel Sensing Intrinsic Amplitude: 1.4 mV
Lead Channel Sensing Intrinsic Amplitude: 6.4 mV
Lead Channel Setting Pacing Amplitude: 1.75 V
Lead Channel Setting Pacing Amplitude: 2.5 V
Lead Channel Setting Pacing Pulse Width: 1 ms
Lead Channel Setting Sensing Sensitivity: 2 mV
Pulse Gen Model: 2272
Pulse Gen Serial Number: 3943401

## 2023-07-16 ENCOUNTER — Telehealth: Payer: Self-pay

## 2023-07-16 NOTE — Telephone Encounter (Signed)
 Appointment made for 240 on Thursday in the device clinic.

## 2023-07-16 NOTE — Telephone Encounter (Signed)
 Yes, please bring in to assess the RV  threshold. Would be nice if we did not have the issue of generator change come up during hospice.

## 2023-07-16 NOTE — Telephone Encounter (Signed)
 Alert remote transmission: ventricular high rate There was one VHR detected during an atrial arrhythmia that was 3 minutes and 24 seconds at a rate of 158 bpm, sent to triage  Known PAF, on OAC according to previous reports, AF burden is 16% of the time.  Follow up as scheduled. ML, CVRS   Programmed 5V--25ms since 12/2022 on RV. On Hospice. 3 y 3 m in November and 1 y 87 m today. Per 3/31 telephone note patient is not going to MD appointments anymore. Sending to MD for review of high RV output to assess for need for intervention.

## 2023-07-19 ENCOUNTER — Ambulatory Visit: Attending: Family

## 2023-07-27 ENCOUNTER — Telehealth: Payer: Self-pay

## 2023-07-27 NOTE — Telephone Encounter (Signed)
 Patient missed in office appt on 5/22 to evaluate RV lead/threshold at high output.   She is on Hospice;however, was planning to come out to appointment to evaluate.  It would be best for her to see an APP versus being seen in device clinic if she is coming out to clinic.   Forwarding to scheduling team to reach out to patient/family about scheduling appointment in next week or 2 to evaluate lead and device follow up.

## 2023-07-30 NOTE — Telephone Encounter (Signed)
 Spoke w/ patients daughter, Ammon Bales - patient has been rescheduled to 6/13 w/ EP APP.

## 2023-08-10 ENCOUNTER — Ambulatory Visit: Attending: Physician Assistant | Admitting: Physician Assistant

## 2023-08-10 NOTE — Progress Notes (Deleted)
 Cardiology Office Note:  .   Date:  08/10/2023  ID:  Kirkwood Perch, DOB [birth year], MRN [id number] PCP: Marce Sensing, NP  [facility] Providers Cardiologist:  Luana Rumple, MD {  History of Present Illness: .   GARLENE APPERSON is a 89 y.o. female w/PMHx of  HTN, HLD Non-obstructive CAD (cath 2005) VHD s/p bioprosthetic AVR (2005) >> subsequent severe AS and CHF(patient preference not to pursue TAVR/intervention > DNR) SND w/PPM AFib  She last saw Dr. Tita Form 12/19/21, she was doing fairly well, sedentary but getting around her house ok. Given thin body habitus, device hardware very prominent (but no thinning or erosion described) RV lead threshold described as chronically mildly elevated but stable  Today's visit is scheduled to evaluate RV lead ROS:   *** eliquis , dose, labs, bleeding *** symptoms RV lead has tripped high output >> battery declining rapidly > trying to conserve battery *** on hospice >> device only  *** otherwise, no longer going to appts  Device information [employer] dual chamber PPM implanted 07/23/2003, gen change 2014, gen change 10/11/20  Studies Reviewed: Aaron Aas    EKG done today and reviewed by myself:  ***  DEVICE interrogation done today and reviewed by myself *** Battery and lead measurements are good ***   04/11/2021: TTE 1. Left ventricular ejection fraction, by estimation, is 60 to 65%. The  left ventricle has normal function. The left ventricle has no regional  wall motion abnormalities. There is moderate left ventricular hypertrophy.  Left ventricular diastolic  parameters are indeterminate.   2. Right ventricular systolic function was not well visualized. The right  ventricular size is not well visualized. There is normal pulmonary artery  systolic pressure. The estimated right ventricular systolic pressure is  25.5 mmHg.   3. The mitral valve is degenerative. Trivial mitral valve regurgitation.  No evidence of mitral  stenosis.   4. There is a bioprosthetic valve present in the aortic position.      Aortic valve regurgitation is trivial. Vmax 3.4 m/s, MG 29 mmHg, EOA  0.5 cm^2, DI 0.2. Findings suggest significant prosthetic valve stenosis,  gradient has significantly increased from prior echo 08/2020 (15 > 29 mmHg)   5. The inferior vena cava is normal in size with greater than 50%  respiratory variability, suggesting right atrial pressure of 3 mmHg.   6. Left atrial size was mildly dilated.    Risk Assessment/Calculations:    Physical Exam:   VS:  There were no vitals taken for this visit.   Wt Readings from Last 3 Encounters:  12/19/21 134 lb 9.6 oz (61.1 kg)  06/07/21 139 lb 4.8 oz (63.2 kg)  05/02/21 138 lb 9.6 oz (62.9 kg)    GEN: Well nourished, well developed in no acute distress NECK: No JVD; No carotid bruits CARDIAC: ***RRR, no murmurs, rubs, gallops RESPIRATORY:  *** CTA b/l without rales, wheezing or rhonchi  ABDOMEN: Soft, non-tender, non-distended EXTREMITIES: *** No edema; No deformity   PPM site: *** is stable, no thinning, fluctuation, tethering  ASSESSMENT AND PLAN: .    PPM ***  ***  AFib CHA2DS2Vasc is 4, on Eliquis , *** appropriately dosed *** % burden  VHD S/p bioprosthetic AVR Subsequent severe AS Conservative management *** C/w Dr. Lord Rod  Secondary hypercoagulable state 2/2 AFib     {Are you ordering a CV Procedure (e.g. stress test, cath, DCCV, TEE, etc)?   Press F2        :[id number]}  Dispo: ***  Signed, Debbie Fails, PA-C

## 2023-08-10 NOTE — Telephone Encounter (Signed)
 Patient did not come in (likely unable based on notes) I have reached out to [employer] rep, they would be willing to do a home visit if Dr. Tita Form and family agreeable. I will send a staff message to the device linic and Dr. Tita Form If everyone is agreeable will ask that the device clinic let the rep knowschedule  Hannalee Castor

## 2023-08-21 NOTE — Addendum Note (Signed)
 Addended by: VICCI SELLER A on: 08/21/2023 04:27 PM   Modules accepted: Orders

## 2023-08-21 NOTE — Progress Notes (Signed)
 Remote pacemaker transmission.

## 2023-09-03 NOTE — Telephone Encounter (Signed)
 Spoke with Pt's daughter.  Would appreciate a house call for a device check.  Will advise Bryan.  Await further needs.

## 2023-09-05 ENCOUNTER — Telehealth: Payer: Self-pay

## 2023-09-05 NOTE — Telephone Encounter (Signed)
 Nat with [employer] went out to see patient today and interrogate her device.   There were noted AMS episodes, majority appear Aflutter, AF burden is 19%. She is beginning to pace more with underling heart rates today in the 30's.  PMT episodes were false, programming adjustment made to improve.  No V-A conduction on testing today.   Patient is frail and very thin skinned with evidence of device pressing outward but still under the skin (see image below).   Interrogation report sent for scanning in to [facility].  Will continue to monitor remote transmissions.  Monitor checked and is working fine.

## 2023-09-05 NOTE — Telephone Encounter (Signed)
 Thanks, will stick with the current follow up plan

## 2023-09-06 ENCOUNTER — Other Ambulatory Visit: Payer: Self-pay | Admitting: Cardiovascular Disease

## 2023-09-10 ENCOUNTER — Encounter: Payer: Self-pay | Admitting: Cardiovascular Disease

## 2023-09-28 DEATH — deceased

## 2023-10-01 ENCOUNTER — Other Ambulatory Visit: Payer: Self-pay | Admitting: Cardiovascular Disease

## 2023-10-10 ENCOUNTER — Encounter

## 2023-12-24 IMAGING — DX DG CHEST 1V PORT
1 series · 1 of 1 positions shown · non-contrast
Comparison: 03/29/2021

CLINICAL DATA: Shortness of breath.  Concern for pneumonia.

EXAM:
PORTABLE CHEST 1 VIEW

[chest]
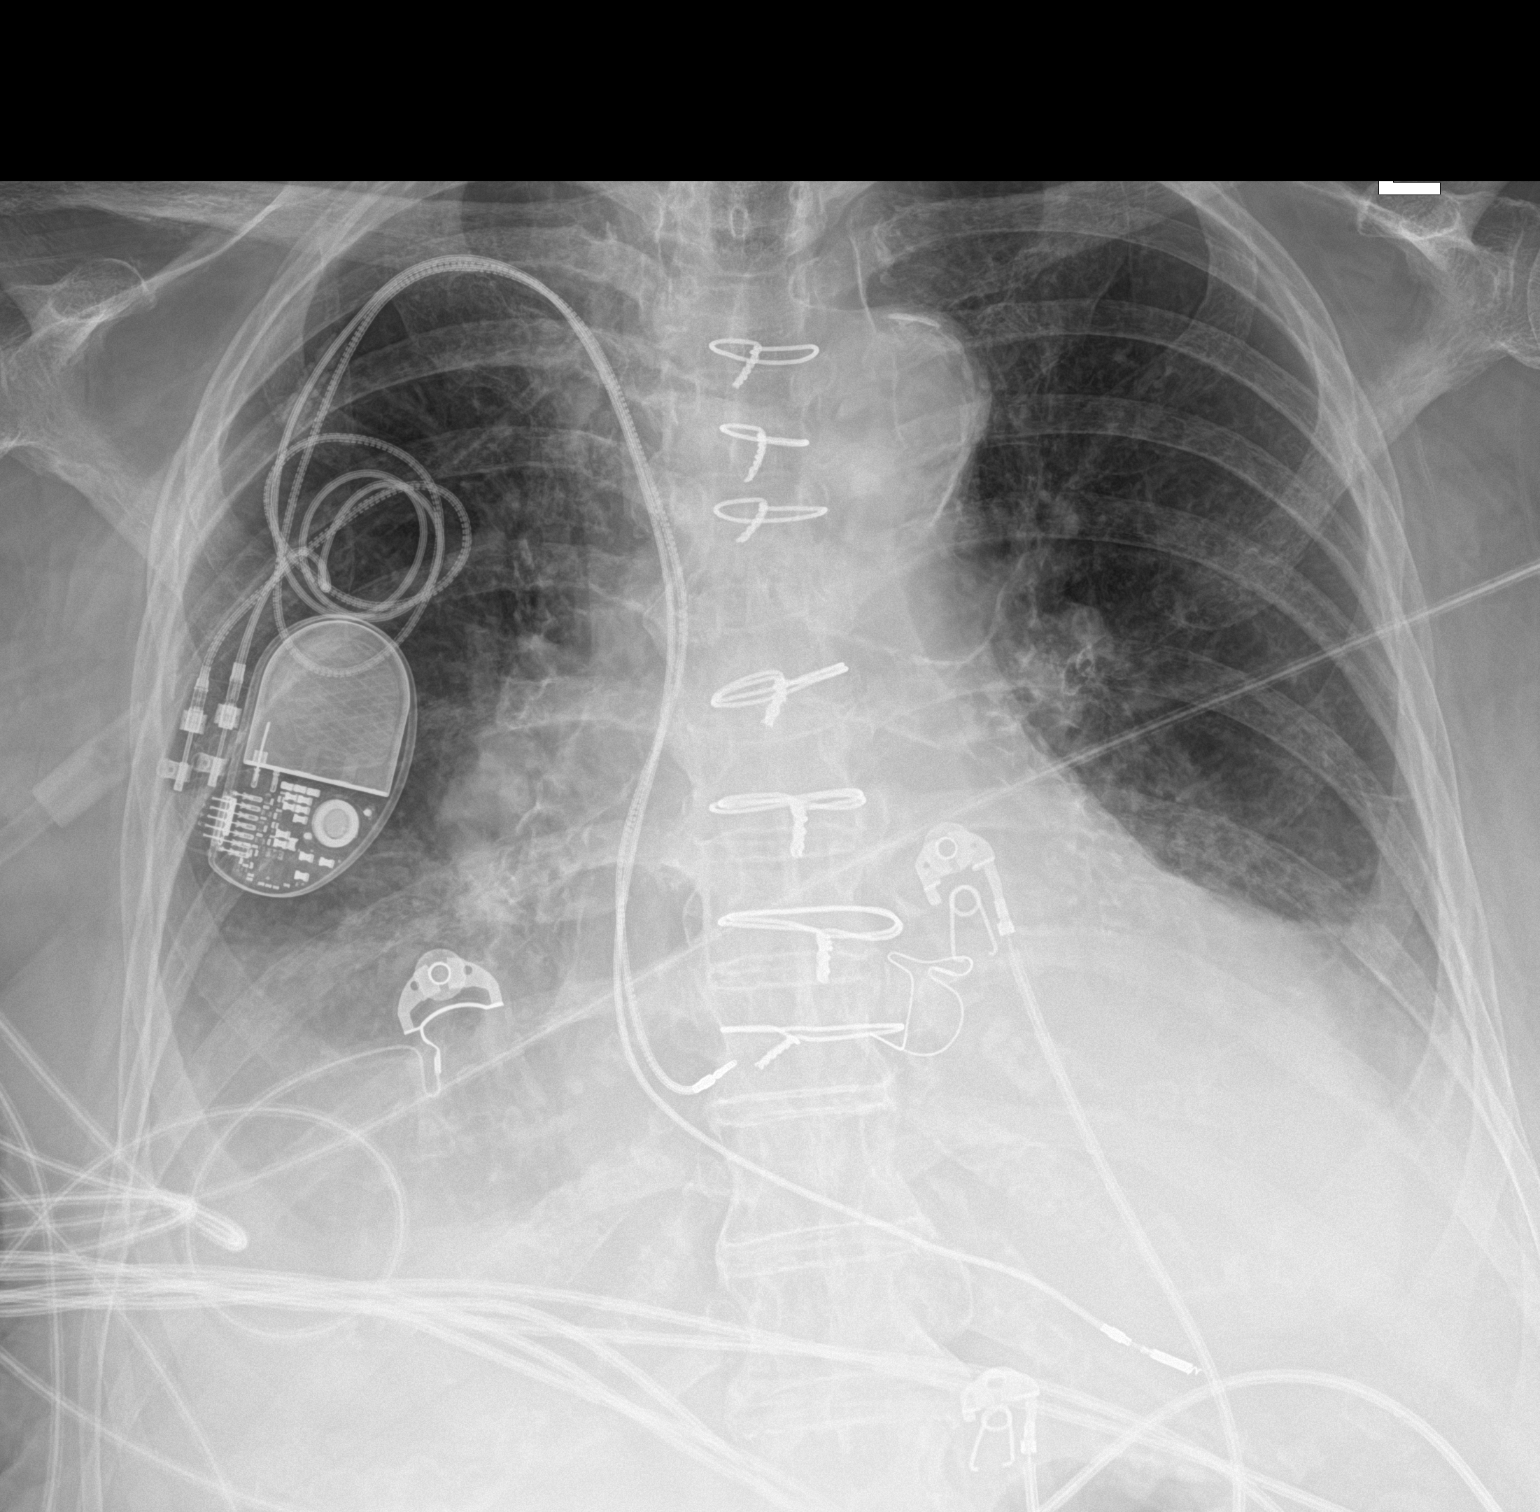

[1 of 1 positions shown; findings below may reference images not displayed]

FINDINGS: A pacemaker remains in place with leads terminating over the right
atrium and right ventricle. There is unchanged enlargement of the
cardiac silhouette status post aortic valve replacement. Aortic
atherosclerosis is noted. There are persistent small bilateral
pleural effusions with bibasilar airspace opacities and mild
accentuation of the interstitial markings. No pneumothorax is
identified. No acute osseous abnormality is seen.
IMPRESSION: Cardiomegaly with persistent small bilateral pleural effusions,
possible mild interstitial edema, and bibasilar atelectasis or
consolidation.

## 2024-01-09 ENCOUNTER — Encounter

## 2024-04-09 ENCOUNTER — Encounter

## 2024-07-09 ENCOUNTER — Encounter

## 2024-10-08 ENCOUNTER — Encounter
